# Patient Record
Sex: Female | Born: 1990 | Race: White | Hispanic: No | Smoking: Current every day smoker
Health system: Southern US, Community
[De-identification: ages and names within clinical notes are randomized; demographics above are authoritative.]

## PROBLEM LIST (undated history)

## (undated) DIAGNOSIS — K219 Gastro-esophageal reflux disease without esophagitis: Secondary | ICD-10-CM

## (undated) DIAGNOSIS — F32A Depression, unspecified: Secondary | ICD-10-CM

## (undated) DIAGNOSIS — Z720 Tobacco use: Secondary | ICD-10-CM

## (undated) DIAGNOSIS — I10 Essential (primary) hypertension: Secondary | ICD-10-CM

## (undated) DIAGNOSIS — Z973 Presence of spectacles and contact lenses: Secondary | ICD-10-CM

## (undated) DIAGNOSIS — F419 Anxiety disorder, unspecified: Secondary | ICD-10-CM

## (undated) DIAGNOSIS — E109 Type 1 diabetes mellitus without complications: Secondary | ICD-10-CM

## (undated) DIAGNOSIS — F329 Major depressive disorder, single episode, unspecified: Secondary | ICD-10-CM

## (undated) HISTORY — DX: Essential (primary) hypertension: I10

## (undated) HISTORY — PX: FOOT SURGERY: SHX648

## (undated) HISTORY — DX: Anxiety disorder, unspecified: F41.9

## (undated) HISTORY — DX: Presence of spectacles and contact lenses: Z97.3

## (undated) HISTORY — DX: Gastro-esophageal reflux disease without esophagitis: K21.9

## (undated) HISTORY — DX: Depression, unspecified: F32.A

## (undated) HISTORY — DX: Type 1 diabetes mellitus without complications: E10.9

---
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
## 2014-05-25 DIAGNOSIS — E1065 Type 1 diabetes mellitus with hyperglycemia: Secondary | ICD-10-CM | POA: Insufficient documentation

## 2014-05-25 DIAGNOSIS — E1042 Type 1 diabetes mellitus with diabetic polyneuropathy: Secondary | ICD-10-CM | POA: Insufficient documentation

## 2014-05-25 DIAGNOSIS — E1040 Type 1 diabetes mellitus with diabetic neuropathy, unspecified: Secondary | ICD-10-CM | POA: Insufficient documentation

## 2014-08-24 DIAGNOSIS — I10 Essential (primary) hypertension: Secondary | ICD-10-CM | POA: Insufficient documentation

## 2015-04-16 ENCOUNTER — Emergency Department (HOSPITAL_COMMUNITY): Payer: Self-pay

## 2015-04-16 ENCOUNTER — Inpatient Hospital Stay (HOSPITAL_COMMUNITY)
Admission: EM | Admit: 2015-04-16 | Discharge: 2015-04-20 | DRG: 638 | Disposition: A | Discharge status: Home / Self Care | Payer: Self-pay | Attending: Internal Medicine | Admitting: Internal Medicine

## 2015-04-16 ENCOUNTER — Encounter (HOSPITAL_COMMUNITY): Payer: Self-pay | Admitting: Emergency Medicine

## 2015-04-16 DIAGNOSIS — Z720 Tobacco use: Secondary | ICD-10-CM | POA: Diagnosis present

## 2015-04-16 DIAGNOSIS — E119 Type 2 diabetes mellitus without complications: Secondary | ICD-10-CM

## 2015-04-16 DIAGNOSIS — B999 Unspecified infectious disease: Secondary | ICD-10-CM

## 2015-04-16 DIAGNOSIS — E1140 Type 2 diabetes mellitus with diabetic neuropathy, unspecified: Secondary | ICD-10-CM | POA: Diagnosis present

## 2015-04-16 DIAGNOSIS — L089 Local infection of the skin and subcutaneous tissue, unspecified: Secondary | ICD-10-CM | POA: Diagnosis present

## 2015-04-16 DIAGNOSIS — E10319 Type 1 diabetes mellitus with unspecified diabetic retinopathy without macular edema: Secondary | ICD-10-CM | POA: Diagnosis present

## 2015-04-16 DIAGNOSIS — F17200 Nicotine dependence, unspecified, uncomplicated: Secondary | ICD-10-CM | POA: Diagnosis present

## 2015-04-16 DIAGNOSIS — X58XXXA Exposure to other specified factors, initial encounter: Secondary | ICD-10-CM | POA: Diagnosis present

## 2015-04-16 DIAGNOSIS — E1165 Type 2 diabetes mellitus with hyperglycemia: Secondary | ICD-10-CM | POA: Diagnosis present

## 2015-04-16 DIAGNOSIS — L02612 Cutaneous abscess of left foot: Secondary | ICD-10-CM | POA: Diagnosis present

## 2015-04-16 DIAGNOSIS — S92322A Displaced fracture of second metatarsal bone, left foot, initial encounter for closed fracture: Secondary | ICD-10-CM | POA: Diagnosis present

## 2015-04-16 DIAGNOSIS — Z79899 Other long term (current) drug therapy: Secondary | ICD-10-CM

## 2015-04-16 DIAGNOSIS — L97529 Non-pressure chronic ulcer of other part of left foot with unspecified severity: Secondary | ICD-10-CM | POA: Diagnosis present

## 2015-04-16 DIAGNOSIS — Z794 Long term (current) use of insulin: Secondary | ICD-10-CM

## 2015-04-16 DIAGNOSIS — L03032 Cellulitis of left toe: Secondary | ICD-10-CM | POA: Diagnosis present

## 2015-04-16 DIAGNOSIS — E11628 Type 2 diabetes mellitus with other skin complications: Principal | ICD-10-CM | POA: Diagnosis present

## 2015-04-16 HISTORY — DX: Tobacco use: Z72.0

## 2015-04-16 LAB — COMPREHENSIVE METABOLIC PANEL
ALT: 18 U/L (ref 14–54)
AST: 14 U/L — ABNORMAL LOW (ref 15–41)
Albumin: 3.8 g/dL (ref 3.5–5.0)
Alkaline Phosphatase: 94 U/L (ref 38–126)
Anion gap: 9 (ref 5–15)
BUN: 12 mg/dL (ref 6–20)
CO2: 20 mmol/L — ABNORMAL LOW (ref 22–32)
Calcium: 9.2 mg/dL (ref 8.9–10.3)
Chloride: 104 mmol/L (ref 101–111)
Creatinine, Ser: 0.64 mg/dL (ref 0.44–1.00)
GFR calc Af Amer: >60 mL/min (ref >60)
GFR calc non Af Amer: >60 mL/min (ref >60)
Glucose, Bld: 404 mg/dL — ABNORMAL HIGH (ref 65–99)
Potassium: 4.7 mmol/L (ref 3.5–5.1)
Sodium: 133 mmol/L — ABNORMAL LOW (ref 135–145)
Total Bilirubin: 0.8 mg/dL (ref 0.3–1.2)
Total Protein: 6.9 g/dL (ref 6.5–8.1)

## 2015-04-16 LAB — URINE MICROSCOPIC-ADD ON: Bacteria, UA: NONE SEEN

## 2015-04-16 LAB — GLUCOSE, CAPILLARY: Glucose-Capillary: 312 mg/dL — ABNORMAL HIGH (ref 65–99)

## 2015-04-16 LAB — CBC
HCT: 36.5 % (ref 36.0–46.0)
Hemoglobin: 11.8 g/dL — ABNORMAL LOW (ref 12.0–15.0)
MCH: 28.6 pg (ref 26.0–34.0)
MCHC: 32.3 g/dL (ref 30.0–36.0)
MCV: 88.6 fL (ref 78.0–100.0)
Platelets: 259 10*3/uL (ref 150–400)
RBC: 4.12 MIL/uL (ref 3.87–5.11)
RDW: 13.6 % (ref 11.5–15.5)
WBC: 9.2 10*3/uL (ref 4.0–10.5)

## 2015-04-16 LAB — URINALYSIS, ROUTINE W REFLEX MICROSCOPIC
Bilirubin Urine: NEGATIVE
Glucose, UA: >1000 mg/dL — AB
Hgb urine dipstick: NEGATIVE
Ketones, ur: >80 mg/dL — AB
Leukocytes, UA: NEGATIVE
Nitrite: NEGATIVE
Protein, ur: NEGATIVE mg/dL
Specific Gravity, Urine: 1.034 — ABNORMAL HIGH (ref 1.005–1.030)
pH: 5.5 (ref 5.0–8.0)

## 2015-04-16 LAB — PREALBUMIN: Prealbumin: 22.2 mg/dL (ref 18–38)

## 2015-04-16 LAB — LIPASE, BLOOD: Lipase: 18 U/L (ref 11–51)

## 2015-04-16 LAB — TYPE AND SCREEN
ABO/RH(D): O POS
Antibody Screen: NEGATIVE

## 2015-04-16 LAB — PROTIME-INR
INR: 1.11 (ref 0.00–1.49)
Prothrombin Time: 14.5 seconds (ref 11.6–15.2)

## 2015-04-16 LAB — APTT: aPTT: 31 seconds (ref 24–37)

## 2015-04-16 LAB — ABO/RH: ABO/RH(D): O POS

## 2015-04-16 LAB — C-REACTIVE PROTEIN: CRP: 1 mg/dL — ABNORMAL HIGH (ref <1.0)

## 2015-04-16 LAB — LACTIC ACID, PLASMA: Lactic Acid, Venous: 1 mmol/L (ref 0.5–2.0)

## 2015-04-16 LAB — SEDIMENTATION RATE: Sed Rate: 35 mm/hr — ABNORMAL HIGH (ref 0–22)

## 2015-04-16 MED ORDER — SODIUM CHLORIDE 0.9 % IV SOLN
INTRAVENOUS | Status: DC
  Administered 2015-04-16 – 2015-04-18 (×3): via INTRAVENOUS

## 2015-04-16 MED ORDER — VANCOMYCIN HCL 10 G IV SOLR
2000.0000 mg | Freq: Once | INTRAVENOUS | Status: AC
  Administered 2015-04-16: 2000 mg via INTRAVENOUS
  Filled 2015-04-16: qty 2000

## 2015-04-16 MED ORDER — INSULIN ASPART 100 UNIT/ML ~~LOC~~ SOLN
0.0000 [IU] | Freq: Three times a day (TID) | SUBCUTANEOUS | Status: DC
  Administered 2015-04-17: 5 [IU] via SUBCUTANEOUS
  Administered 2015-04-17: 1 [IU] via SUBCUTANEOUS
  Administered 2015-04-17: 3 [IU] via SUBCUTANEOUS
  Administered 2015-04-18 (×2): 1 [IU] via SUBCUTANEOUS
  Administered 2015-04-18: 5 [IU] via SUBCUTANEOUS
  Administered 2015-04-19 (×3): 2 [IU] via SUBCUTANEOUS
  Administered 2015-04-20: 3 [IU] via SUBCUTANEOUS
  Administered 2015-04-20: 7 [IU] via SUBCUTANEOUS

## 2015-04-16 MED ORDER — ACETAMINOPHEN 650 MG RE SUPP: 650.0000 mg | Freq: Four times a day (QID) | RECTAL | Status: DC | PRN

## 2015-04-16 MED ORDER — VANCOMYCIN HCL IN DEXTROSE 1-5 GM/200ML-% IV SOLN
1000.0000 mg | Freq: Three times a day (TID) | INTRAVENOUS | Status: DC
  Administered 2015-04-17 – 2015-04-20 (×10): 1000 mg via INTRAVENOUS
  Filled 2015-04-16 (×12): qty 200

## 2015-04-16 MED ORDER — MORPHINE SULFATE (PF) 2 MG/ML IV SOLN
2.0000 mg | INTRAVENOUS | Status: DC | PRN
  Administered 2015-04-17 – 2015-04-20 (×6): 2 mg via INTRAVENOUS
  Filled 2015-04-16 (×6): qty 1

## 2015-04-16 MED ORDER — SODIUM CHLORIDE 0.9 % IV BOLUS (SEPSIS)
2000.0000 mL | Freq: Once | INTRAVENOUS | Status: AC
  Administered 2015-04-16: 2000 mL via INTRAVENOUS

## 2015-04-16 MED ORDER — PIPERACILLIN-TAZOBACTAM 3.375 G IVPB
3.3750 g | Freq: Three times a day (TID) | INTRAVENOUS | Status: DC
  Administered 2015-04-17 – 2015-04-20 (×10): 3.375 g via INTRAVENOUS
  Filled 2015-04-16 (×12): qty 50

## 2015-04-16 MED ORDER — OXYCODONE-ACETAMINOPHEN 5-325 MG PO TABS
2.0000 | ORAL_TABLET | Freq: Four times a day (QID) | ORAL | Status: DC | PRN
  Administered 2015-04-16 – 2015-04-19 (×8): 2 via ORAL
  Filled 2015-04-16 (×8): qty 2

## 2015-04-16 MED ORDER — PIPERACILLIN-TAZOBACTAM 3.375 G IVPB 30 MIN
3.3750 g | Freq: Once | INTRAVENOUS | Status: AC
  Administered 2015-04-16: 3.375 g via INTRAVENOUS
  Filled 2015-04-16: qty 50

## 2015-04-16 MED ORDER — ACETAMINOPHEN 325 MG PO TABS: 650.0000 mg | ORAL_TABLET | Freq: Four times a day (QID) | ORAL | Status: DC | PRN

## 2015-04-16 MED ORDER — INSULIN DETEMIR 100 UNIT/ML ~~LOC~~ SOLN
40.0000 [IU] | Freq: Every day | SUBCUTANEOUS | Status: DC
  Administered 2015-04-16: 40 [IU] via SUBCUTANEOUS
  Filled 2015-04-16 (×2): qty 0.4

## 2015-04-16 MED ORDER — ONDANSETRON HCL 4 MG/2ML IJ SOLN: 4.0000 mg | Freq: Three times a day (TID) | INTRAMUSCULAR | Status: DC | PRN

## 2015-04-16 MED ORDER — NICOTINE 21 MG/24HR TD PT24
21.0000 mg | MEDICATED_PATCH | Freq: Once | TRANSDERMAL | Status: AC
  Administered 2015-04-16: 21 mg via TRANSDERMAL
  Filled 2015-04-16 (×2): qty 1

## 2015-04-16 NOTE — H&P (Addendum)
[facility] History and Physical  Alicen Donalson ZOX:[id number] DOB: Jul 08, 1990 DOA: 04/16/2015  Referring physician: ED physician PCP: No PCP Per Patient  Specialists:   Chief Complaint: Left foot pain  HPI: Molly Savage is a 25 y.o. female with PMH of tobacco abuse, diabetes mellitus, who presents with left foot pain.  Pt reports that 2 weeks ago she noticed the callus in left foot. It became painful and since then she has developed a blister, which has progressed to have formed a wound. She reports one month ago she saw a podiatrist who filed the callus but it has returned. She said that she was hospitalized for similar infection in 2015. Currently patient hassevere pain, but no fever, chills. She has chronic nausea due to possible diabetic gastroparesis, but no vomiting, abdominal pain or diarrhea. She reports taking numerous doses of Tylenol and ibuprofen to be able to work. No medical treatment for this new infection prior to arrival. Patient does not have chest pain, shortness breath, cough, unilateral weakness, symptoms of UTI.  In ED, patient was found to have WBC 9.2, temperature normal, pseudohyponatremia, creatinine 0.64, lipase normal, blood sugar 404. X-ray of left foot showed large appearing soft tissue wound lateral to the fifth MTP joint with a second wound versus soft tissue gas between the distal fourth and fifth metatarsals. Negative for plain film evidence of osteomyelitis; acute or subacute appearing fracture neck of the second metatarsal with slight lateral displacement. Patient is admitted to inpatient for further urination the treatment and observation. Orthopedic surgeon, Dr. Sharol Given was consulted, will see in the morning.   EKG: Not done in ED, will get one.   Where does patient live?   At home Can patient participate in ADLs?  Yes   Review of Systems:   General: no fevers, chills, no changes in body weight, has fatigue HEENT: no blurry vision, hearing  changes or sore throat Pulm: no dyspnea, coughing, wheezing CV: no chest pain, no palpitations Abd: no nausea, vomiting, abdominal pain, diarrhea, constipation GU: no dysuria, burning on urination, increased urinary frequency, hematuria  Ext: no leg edema. Has left foot lesion and pain Neuro: no unilateral weakness, numbness, or tingling, no vision change or hearing loss Skin: no rash. MSK: No muscle spasm, no deformity, no limitation of range of movement in spin Heme: No easy bruising.  Travel history: No recent long distant travel.  Allergy: No Known Allergies  Past Medical History  Diagnosis Date  . Diabetes mellitus without complication ([place])   . Tobacco abuse     History reviewed. No pertinent past surgical history.  Social History:  reports that she has been smoking.  She does not have any smokeless tobacco history on file. She reports that she drinks alcohol. She reports that she does not use illicit drugs.  Family History:  Family History  Problem Relation Age of Onset  . Hypertension Mother   . Bipolar disorder Sister      Prior to Admission medications   Medication Sig Start Date End Date Taking? Authorizing Provider  acetaminophen (TYLENOL) 500 MG tablet Take 1,000 mg by mouth every 6 (six) hours as needed for moderate pain.   Yes Historical Provider, MD  ibuprofen (ADVIL,MOTRIN) 200 MG tablet Take 400 mg by mouth every 6 (six) hours as needed.   Yes Historical Provider, MD  insulin aspart (NOVOLOG) 100 UNIT/ML injection Inject 10-30 Units into the skin 3 (three) times daily before meals. Takes 30 units when eating Takes 10 units when not  eating   Yes Historical Provider, MD  insulin detemir (LEVEMIR) 100 UNIT/ML injection Inject 60 Units into the skin at bedtime.   Yes Historical Provider, MD    Physical Exam: Filed Vitals:   04/16/15 1533  BP: 134/76  Pulse: 99  Temp: 98.4 F (36.9 C)  TempSrc: Oral  Resp: 18  Height: _0  (1.727 m)  Weight: 110.309 kg  (243 lb 3 oz)  SpO2: 100%   General: Not in acute distress HEENT:       Eyes: PERRL, EOMI, no scleral icterus.       ENT: No discharge from the ears and nose, no pharynx injection, no tonsillar enlargement.        Neck: No JVD, no bruit, no mass felt. Heme: No neck lymph node enlargement. Cardiac: S1/S2, RRR, No murmurs, No gallops or rubs. Pulm: No rales, wheezing, rhonchi or rubs. Abd: Soft, nondistended, nontender, no rebound pain, no organomegaly, BS present. Ext: No pitting leg edema bilaterally. 2+DP/PT pulse bilaterally. A lesion is noted to the left lateral aspect of the distal foot over the MTP of the fifth toe, the lesion is greater than 2 cm with surrounding erythema, it looks deep, with pus. The entire area is tender to palpation and warm  Musculoskeletal: No joint deformities, No joint redness or warmth, no limitation of ROM in spin. Skin: No rashes.  Neuro: Alert, oriented X3, cranial nerves II-XII grossly intact, moves all extremities normally.  Psych: Patient is not psychotic, no suicidal or hemocidal ideation.  Labs on Admission:  Basic Metabolic Panel:  Recent Labs Lab 04/16/15 1615  NA 133*  K 4.7  CL 104  CO2 20*  GLUCOSE 404*  BUN 12  CREATININE 0.64  CALCIUM 9.2   Liver Function Tests:  Recent Labs Lab 04/16/15 1615  AST 14*  ALT 18  ALKPHOS 94  BILITOT 0.8  PROT 6.9  ALBUMIN 3.8    Recent Labs Lab 04/16/15 1615  LIPASE 18   No results for input(s): AMMONIA in the last 168 hours. CBC:  Recent Labs Lab 04/16/15 1615  WBC 9.2  HGB 11.8*  HCT 36.5  MCV 88.6  PLT 259   Cardiac Enzymes: No results for input(s): CKTOTAL, CKMB, CKMBINDEX, TROPONINI in the last 168 hours.  BNP (last 3 results) No results for input(s): BNP in the last 8760 hours.  ProBNP (last 3 results) No results for input(s): PROBNP in the last 8760 hours.  CBG: No results for input(s): GLUCAP in the last 168 hours.  Radiological Exams on Admission: Ap /  Lateral X-ray Left Foot  04/16/2015  CLINICAL DATA:  Wound on the lateral aspect of the left foot in a diabetic patient. Chronicity unknown. Initial encounter. EXAM: LEFT FOOT - 2 VIEW COMPARISON:  None. FINDINGS: A large wound is seen along the fifth MTP joint and there appears to be a second wound or collection of soft tissue gas between the distal fourth and fifth metatarsals. No bony destructive change about the fifth MTP joint is identified. The patient has a fracture through the neck of the fifth metatarsal which shows minimal lateral displacement. Bones appear somewhat osteopenic. No other acute bony or joint abnormality is identified. IMPRESSION: Large appearing soft tissue wound lateral to the fifth MTP joint with a second wound versus soft tissue gas between the distal fourth and fifth metatarsals. Negative for plain film evidence of osteomyelitis. Acute or subacute appearing fracture neck of the second metatarsal with slight lateral displacement. Electronically Signed  By: Inge Rise M.D.   On: 04/16/2015 19:00    Assessment/Plan Principal Problem:   Diabetic infection of left foot (HCC) Active Problems:   Diabetes mellitus without complication (HCC)   Tobacco abuse   Diabetic infection of left foot ([place]): X-ray did not show osteomyelitis, but the lesion looks deep-->need to r/o osteo. Pt is not septic on admission. Hemodynamically stable. I spoke with ortho, Dr. Sharol Given who agreed to do MRI of her left foot, and he recommended to start IV Vanco and Zosyn. He will see patient in the morning.  - will admit to Med-surg bed for observation - Empiric antimicrobial treatment with vancomycin and Zosyn per pharmacy - PRN Zofran for nausea, morphine and Percocet for pain - Blood cultures x 2  - ESR and CRP - wound care consult - MRI-Left foot - will get lactic acid levels - IVF: 2.0L of NS bolus in ED, followed by 100cc/h - INR/PTT/type & screen - Follow-up ortho's recommendation -  f/u ABI which is ordered by EDP  Acute or subacute appearing fracture neck of the second metatarsal with slight lateral displacement: This was an incidental finding on x-ray. -Follow-up orthostatic recommendations -pain control as above  DM-II: Last A1c not on record. Patient is taking Lantus and NovoLog at home -will decrease Lantus dose from 60-40 units daily  -SSI -Check A1c  Tobacco abuse: -Did counseling about importance of quitting smoking -Nicotine patch  DVT ppx: SCD  Code Status: Full code Family Communication: None at bed side. Disposition Plan: Admit to inpatient   Date of Service 04/16/2015    Ivor Costa [facility] Pager [phone]  If 7PM-7AM, please contact night-coverage [email or web address] Password [email or web address] 04/16/2015, 8:30 PM

## 2015-04-16 NOTE — Progress Notes (Signed)
Pharmacy Antibiotic Note  Dorna BloomSusan Lubas is a 25 y.o. female admitted on 04/16/2015 with foot pain and noted with DM foot infection (no osteo on X-ray). Pharmacy has been consulted for vancomycin/zosyn dosing. -WBC= 9.2, afeb, SCr= 0.64, CrCl > 100  Plan: -Zosyn 3.375gm IV q8h -Vancomycin 2000mg  IV x1 followed by 1000mg  IV q8h (goal trough 10-15) -Will follow renal function, cultures and clinical progress   Height: 5\' 8"  (172.7 cm) Weight: 243 lb 3 oz (110.309 kg) IBW/kg (Calculated) : 63.9  Temp (24hrs), Avg:98.4 F (36.9 C), Min:98.4 F (36.9 C), Max:98.4 F (36.9 C)   Recent Labs Lab 04/16/15 1615  WBC 9.2  CREATININE 0.64    Estimated Creatinine Clearance: 138.8 mL/min (by C-G formula based on Cr of 0.64).    No Known Allergies  Antimicrobials this admission: Vanc 3/26>> Zosyn 3/26>>  Microbiology results: 3/26 blood x2  Thank you for allowing pharmacy to be a part of this patient's care.  Harland GermanAndrew Raheem Kolbe, Pharm D 04/16/2015 8:29 PM

## 2015-04-16 NOTE — ED Provider Notes (Signed)
CSN: [id number]     Arrival date & time 04/16/15  1503 History   First MD Initiated Contact with Patient 04/16/15 1650     Chief Complaint  Patient presents with  . Foot Pain     (Consider location/radiation/quality/duration/timing/severity/associated sxs/prior Treatment) The history is provided by the patient, medical records and a significant other. No language interpreter was used.     Molly BloomSusan Gambill is a 25 y.o. female  with a hx of IDDM presents to the Emergency Department complaining of gradual, persistent, progressively worsening wounds to the left foot onset 2 weeks ago. She reports she has had a callus, left foot for many years and was hospitalized for similar infection in 2015. She reports one month ago she saw a podiatrist who filed the callus but returned. She reports 2 weeks ago she noticed the callus became painful and since then she has developed a blister to the bottom of her foot. She reports taking numerous doses of Tylenol and ibuprofen to be able to work. No medical treatment for this new infection prior to arrival. She denies fever, chills, nausea, vomiting, numbness, tingling, weakness.    Past Medical History  Diagnosis Date  . Diabetes mellitus without complication (HCC)    History reviewed. No pertinent past surgical history. No family history on file. Social History  Substance Use Topics  . Smoking status: Current Every Day Smoker  . Smokeless tobacco: None  . Alcohol Use: Yes   OB History    No data available     Review of Systems  Constitutional: Negative for fever, diaphoresis, appetite change, fatigue and unexpected weight change.  HENT: Negative for mouth sores.   Eyes: Negative for visual disturbance.  Respiratory: Negative for cough, chest tightness, shortness of breath and wheezing.   Cardiovascular: Negative for chest pain.  Gastrointestinal: Negative for nausea, vomiting, abdominal pain, diarrhea and constipation.  Endocrine: Negative for  polydipsia, polyphagia and polyuria.  Genitourinary: Negative for dysuria, urgency, frequency and hematuria.  Musculoskeletal: Negative for back pain and neck stiffness.  Skin: Positive for color change and wound. Negative for rash.  Allergic/Immunologic: Negative for immunocompromised state.  Neurological: Negative for syncope, light-headedness and headaches.  Hematological: Does not bruise/bleed easily.  Psychiatric/Behavioral: Negative for sleep disturbance. The patient is not nervous/anxious.       Allergies  Review of patient's allergies indicates no known allergies.  Home Medications   Prior to Admission medications   Medication Sig Start Date End Date Taking? Authorizing Provider  acetaminophen (TYLENOL) 500 MG tablet Take 1,000 mg by mouth every 6 (six) hours as needed for moderate pain.   Yes Historical Provider, MD  ibuprofen (ADVIL,MOTRIN) 200 MG tablet Take 400 mg by mouth every 6 (six) hours as needed.   Yes Historical Provider, MD  insulin aspart (NOVOLOG) 100 UNIT/ML injection Inject 10-30 Units into the skin 3 (three) times daily before meals. Takes 30 units when eating Takes 10 units when not eating   Yes Historical Provider, MD  insulin detemir (LEVEMIR) 100 UNIT/ML injection Inject 60 Units into the skin at bedtime.   Yes Historical Provider, MD   BP 134/76 mmHg  Pulse 99  Temp(Src) 98.4 F (36.9 C) (Oral)  Resp 18  Ht 5\' 8"  (1.727 m)  Wt 110.309 kg  BMI 36.99 kg/m2  SpO2 100%  LMP 04/10/2015 Physical Exam  Constitutional: She appears well-developed and well-nourished. No distress.  Awake, alert, nontoxic appearance  HENT:  Head: Normocephalic and atraumatic.  Mouth/Throat: Oropharynx is clear and  moist. No oropharyngeal exudate.  Eyes: Conjunctivae are normal. No scleral icterus.  Neck: Normal range of motion. Neck supple.  Cardiovascular: Normal rate, regular rhythm and intact distal pulses.   Pulmonary/Chest: Effort normal and breath sounds normal. No  respiratory distress. She has no wheezes.  Equal chest expansion  Abdominal: Soft. Bowel sounds are normal. She exhibits no mass. There is no tenderness. There is no rebound and no guarding.  Musculoskeletal: Normal range of motion. She exhibits no edema.  Full range of motion of the left ankle and toes of the left foot  Neurological: She is alert.  Speech is clear and goal oriented Moves extremities without ataxia Sensation at baseline to the left foot  Skin: Skin is warm and dry. She is not diaphoretic.  Callus noted to the left lateral aspect of the distal foot over the MTP of the fifth toe.  Large blister filled with pus and discolored extends onto the sole of the foot with greater than 2 cm of surrounding erythema. The entire area is tender to palpation and warm  Psychiatric: She has a normal mood and affect.  Nursing note and vitals reviewed.   ED Course  Procedures (including critical care time) Labs Review Labs Reviewed  COMPREHENSIVE METABOLIC PANEL - Abnormal; Notable for the following:    Sodium 133 (*)    CO2 20 (*)    Glucose, Bld 404 (*)    AST 14 (*)    All other components within normal limits  CBC - Abnormal; Notable for the following:    Hemoglobin 11.8 (*)    All other components within normal limits  URINALYSIS, ROUTINE W REFLEX MICROSCOPIC (NOT AT [facility]) - Abnormal; Notable for the following:    Specific Gravity, Urine 1.034 (*)    Glucose, UA >1000 (*)    Ketones, ur >80 (*)    All other components within normal limits  URINE MICROSCOPIC-ADD ON - Abnormal; Notable for the following:    Squamous Epithelial / LPF 0-5 (*)    All other components within normal limits  CULTURE, BLOOD (ROUTINE X 2)  CULTURE, BLOOD (ROUTINE X 2)  LIPASE, BLOOD  HEMOGLOBIN A1C  HIV ANTIBODY (ROUTINE TESTING)  SEDIMENTATION RATE  C-REACTIVE PROTEIN  PREALBUMIN    Imaging Review Ap / Lateral X-ray Left Foot  04/16/2015  CLINICAL DATA:  Wound on the lateral aspect of the left  foot in a diabetic patient. Chronicity unknown. Initial encounter. EXAM: LEFT FOOT - 2 VIEW COMPARISON:  None. FINDINGS: A large wound is seen along the fifth MTP joint and there appears to be a second wound or collection of soft tissue gas between the distal fourth and fifth metatarsals. No bony destructive change about the fifth MTP joint is identified. The patient has a fracture through the neck of the fifth metatarsal which shows minimal lateral displacement. Bones appear somewhat osteopenic. No other acute bony or joint abnormality is identified. IMPRESSION: Large appearing soft tissue wound lateral to the fifth MTP joint with a second wound versus soft tissue gas between the distal fourth and fifth metatarsals. Negative for plain film evidence of osteomyelitis. Acute or subacute appearing fracture neck of the second metatarsal with slight lateral displacement. Electronically Signed   By: Drusilla Kanner M.D.   On: 04/16/2015 19:00   I have personally reviewed and evaluated these images and lab results as part of my medical decision-making.  MDM   Final diagnoses:  Infection  Diabetes mellitus without complication (HCC)   Molly Savage  presents with diabetic foot infection present for approximately 2 weeks. No evidence of SIRS or sepsis. Patient will need admission for further workup and treatment.   7:47 PM Initial labs reassuring. Discussed with Dr. Clyde Lundborg who will admit to medsurg.    Dahlia Client Elynore Dolinski, PA-C 04/16/15 1948  Rolland Porter, MD 04/22/15 1002

## 2015-04-16 NOTE — ED Notes (Addendum)
Pt c/o had a callous on left foot that has caused infection to her foot. Pt has been hospitalized 2 years ago for same foot issue. Pt is diabetic. Pt uses 4 extra strength tylenol 3 times a day and 4 ibuprofen 3 times a day for pain relief.

## 2015-04-17 ENCOUNTER — Observation Stay (HOSPITAL_BASED_OUTPATIENT_CLINIC_OR_DEPARTMENT_OTHER): Payer: MEDICAID

## 2015-04-17 ENCOUNTER — Observation Stay (HOSPITAL_COMMUNITY): Payer: Self-pay

## 2015-04-17 DIAGNOSIS — L089 Local infection of the skin and subcutaneous tissue, unspecified: Secondary | ICD-10-CM

## 2015-04-17 DIAGNOSIS — Z720 Tobacco use: Secondary | ICD-10-CM

## 2015-04-17 DIAGNOSIS — E119 Type 2 diabetes mellitus without complications: Secondary | ICD-10-CM

## 2015-04-17 DIAGNOSIS — E11628 Type 2 diabetes mellitus with other skin complications: Secondary | ICD-10-CM | POA: Diagnosis present

## 2015-04-17 DIAGNOSIS — M79672 Pain in left foot: Secondary | ICD-10-CM

## 2015-04-17 LAB — BASIC METABOLIC PANEL
Anion gap: 6 (ref 5–15)
BUN: 7 mg/dL (ref 6–20)
CO2: 21 mmol/L — ABNORMAL LOW (ref 22–32)
Calcium: 8.2 mg/dL — ABNORMAL LOW (ref 8.9–10.3)
Chloride: 110 mmol/L (ref 101–111)
Creatinine, Ser: 0.45 mg/dL (ref 0.44–1.00)
GFR calc Af Amer: >60 mL/min (ref >60)
GFR calc non Af Amer: >60 mL/min (ref >60)
Glucose, Bld: 260 mg/dL — ABNORMAL HIGH (ref 65–99)
Potassium: 4.3 mmol/L (ref 3.5–5.1)
Sodium: 137 mmol/L (ref 135–145)

## 2015-04-17 LAB — LACTIC ACID, PLASMA: Lactic Acid, Venous: 0.6 mmol/L (ref 0.5–2.0)

## 2015-04-17 LAB — GLUCOSE, CAPILLARY
Glucose-Capillary: 229 mg/dL — ABNORMAL HIGH (ref 65–99)
Glucose-Capillary: 270 mg/dL — ABNORMAL HIGH (ref 65–99)
Glucose-Capillary: 144 mg/dL — ABNORMAL HIGH (ref 65–99)
Glucose-Capillary: 196 mg/dL — ABNORMAL HIGH (ref 65–99)

## 2015-04-17 LAB — HIV ANTIBODY (ROUTINE TESTING W REFLEX): HIV Screen 4th Generation wRfx: NONREACTIVE

## 2015-04-17 LAB — HEMOGLOBIN A1C
Hgb A1c MFr Bld: 8.4 % — ABNORMAL HIGH (ref 4.8–5.6)
Mean Plasma Glucose: 194 mg/dL

## 2015-04-17 MED ORDER — NICOTINE 21 MG/24HR TD PT24
21.0000 mg | MEDICATED_PATCH | Freq: Every day | TRANSDERMAL | Status: DC
  Administered 2015-04-17 – 2015-04-20 (×4): 21 mg via TRANSDERMAL
  Filled 2015-04-17 (×3): qty 1

## 2015-04-17 MED ORDER — INSULIN DETEMIR 100 UNIT/ML ~~LOC~~ SOLN
60.0000 [IU] | Freq: Every day | SUBCUTANEOUS | Status: DC
  Administered 2015-04-17 – 2015-04-19 (×3): 60 [IU] via SUBCUTANEOUS
  Filled 2015-04-17 (×4): qty 0.6

## 2015-04-17 MED ORDER — GADOBENATE DIMEGLUMINE 529 MG/ML IV SOLN
20.0000 mL | Freq: Once | INTRAVENOUS | Status: AC | PRN
  Administered 2015-04-17: 20 mL via INTRAVENOUS

## 2015-04-17 MED ORDER — COLLAGENASE 250 UNIT/GM EX OINT
TOPICAL_OINTMENT | Freq: Every day | CUTANEOUS | Status: DC
  Administered 2015-04-20: 10:00:00 via TOPICAL
  Filled 2015-04-17: qty 30

## 2015-04-17 MED ORDER — INSULIN ASPART 100 UNIT/ML ~~LOC~~ SOLN
15.0000 [IU] | Freq: Three times a day (TID) | SUBCUTANEOUS | Status: DC
  Administered 2015-04-17 – 2015-04-20 (×10): 15 [IU] via SUBCUTANEOUS

## 2015-04-17 NOTE — Evaluation (Signed)
Physical Therapy Evaluation Patient Details Name: Molly Savage MRN: [id number] DOB: January 08, 1991 Today's Date: 04/17/2015   History of Present Illness  Pt admitted with left 5th metatarsal ulcer. PMHx: DM  Clinical Impression  Pt presents with decreased functional mobility due to pain and NWB in LLE. Pt would benefit from therapy to increase strength and endurance with ambulation with an assistive device, as well as to decrease apprehension with stairs. Pt was apprehensive to use walker with stairs, and would benefit from using crutches with stair training.    Follow Up Recommendations No PT follow up;Supervision for mobility/OOB    Equipment Recommendations  Rolling walker with 5" wheels    Recommendations for Other Services       Precautions / Restrictions Restrictions Weight Bearing Restrictions: Yes LLE Weight Bearing: Non weight bearing      Mobility  Bed Mobility Overal bed mobility: Modified Independent                Transfers Overall transfer level: Needs assistance   Transfers: Sit to/from Stand Sit to Stand: Supervision         General transfer comment: cues for hand placement and safety  Ambulation/Gait Ambulation/Gait assistance: Min guard Ambulation Distance (Feet): 75 Feet Assistive device: Rolling walker (2 wheeled) Gait Pattern/deviations: Step-to pattern   Gait velocity interpretation: Below normal speed for age/gender General Gait Details: cues for position in RW, safety and sequence. pt limited by fatigue  Stairs Stairs: Yes Stairs assistance: Min assist Stair Management: Backwards;With walker Number of Stairs: 1 General stair comments: pt able to perform 1 step backward well but once Rw uneven pt became nervous despite cues, education and reassurance  Wheelchair Mobility    Modified Rankin (Stroke Patients Only)       Balance Overall balance assessment: Modified Independent                                           Pertinent Vitals/Pain Pain Assessment: 0-10 Pain Score: 8  Pain Location: left foot Pain Descriptors / Indicators: Aching    Home Living Family/patient expects to be discharged to:: Private residence Living Arrangements: Spouse/significant other Available Help at Discharge: Friend(s);Available PRN/intermittently Type of Home: Apartment Home Access: Stairs to enter Entrance Stairs-Rails: None Entrance Stairs-Number of Steps: 3 Home Layout: One level Home Equipment: None      Prior Function Level of Independence: Independent               Hand Dominance        Extremity/Trunk Assessment   Upper Extremity Assessment: Overall WFL for tasks assessed           Lower Extremity Assessment: Overall WFL for tasks assessed      Cervical / Trunk Assessment: Normal  Communication   Communication: No difficulties  Cognition Arousal/Alertness: Awake/alert Behavior During Therapy: WFL for tasks assessed/performed Overall Cognitive Status: Within Functional Limits for tasks assessed                      General Comments General comments (skin integrity, edema, etc.): walker needed for balance due to NWB on LLE    Exercises        Assessment/Plan    PT Assessment Patient needs continued PT services  PT Diagnosis Acute pain;Difficulty walking   PT Problem List Decreased strength;Decreased activity tolerance;Pain;Decreased balance;Decreased mobility;Decreased knowledge of use of DME;Decreased skin  integrity  PT Treatment Interventions DME instruction;Gait training;Stair training;Patient/family education;Functional mobility training;Balance training   PT Goals (Current goals can be found in the Care Plan section) Acute Rehab PT Goals Patient Stated Goal: return to work PT Goal Formulation: With patient Time For Goal Achievement: 04/24/15 Potential to Achieve Goals: Good    Frequency Min 3X/week   Barriers to discharge Decreased caregiver  support      Co-evaluation               End of Session Equipment Utilized During Treatment: Gait belt Activity Tolerance: Patient tolerated treatment well Patient left: in chair;with call bell/phone within reach      Functional Assessment Tool Used: clinical judgement Functional Limitation: Mobility: Walking and moving around Mobility: Walking and Moving Around Current Status [phone]): At least 1 percent but less than 20 percent impaired, limited or restricted Mobility: Walking and Moving Around Goal Status [phone]): At least 1 percent but less than 20 percent impaired, limited or restricted    Time: 1005-1030 PT Time Calculation (min) (ACUTE ONLY): 25 min   Charges:   PT Evaluation $PT Eval Moderate Complexity: 1 Procedure PT Treatments $Gait Training: 8-22 mins   PT G Codes:   PT G-Codes **NOT FOR INPATIENT CLASS** Functional Assessment Tool Used: clinical judgement Functional Limitation: Mobility: Walking and moving around Mobility: Walking and Moving Around Current Status [phone]): At least 1 percent but less than 20 percent impaired, limited or restricted Mobility: Walking and Moving Around Goal Status [phone]([id number]): At least 1 percent but less than 20 percent impaired, limited or restricted   Cherene Julianaola Waylen Dep[place]olo, [place]PT [phone]  Cherene Julianaola Sesar [place] 04/17/2015, 11:20 AM

## 2015-04-17 NOTE — Progress Notes (Signed)
Physical Therapy Wound Evaluation/Treatment Patient Details  Name: Molly Savage MRN: [id number] Date of Birth: [birth year]  Today's Date: 04/17/2015 Time:  -     Subjective  Subjective: Pt agreeable to hydrotherapy Patient and Family Stated Goals: Heal wound Date of Onset:  (Pt reports 3 months)  Pain Score: Pt reports increased pain during session and RN was notified. RN provided pain medicine during session.   Wound Assessment  Wound / Incision (Open or Dehisced) 04/17/15 Diabetic ulcer Foot Left;Other (Comment) Plantar surface (Active)  Dressing Type Compression wrap;Foam;Moist to dry 04/17/2015  2:12 PM  Dressing Changed Changed 04/17/2015  2:12 PM  Dressing Status Clean;Dry;Intact 04/17/2015  2:12 PM  Dressing Change Frequency Daily 04/17/2015  2:12 PM  Site / Wound Assessment Red;Yellow;Brown 04/17/2015  2:12 PM  % Wound base Red or Granulating 60% 04/17/2015  2:12 PM  % Wound base Yellow 35% 04/17/2015  2:12 PM  % Wound base Black 5% 04/17/2015  2:12 PM  Peri-wound Assessment Intact;Maceration 04/17/2015  2:12 PM  Wound Length (cm) 2.5 cm 04/17/2015  2:12 PM  Wound Width (cm) 5.3 cm 04/17/2015  2:12 PM  Wound Depth (cm) 0.2 cm 04/17/2015  2:12 PM  Margins Unattached edges (unapproximated) 04/17/2015  2:12 PM  Closure None 04/17/2015  2:12 PM  Drainage Amount Minimal 04/17/2015  2:12 PM  Drainage Description Serosanguineous 04/17/2015  2:12 PM  Treatment Hydrotherapy (Pulse lavage);Packing (Saline gauze) 04/17/2015  2:12 PM  Santyl applied to wound bed prior to applying dressing.   Hydrotherapy Pulsed lavage therapy - wound location: L foot plantar surface Pulsed Lavage with Suction (psi): 8 psi (4 at times due to pain) Pulsed Lavage with Suction - Normal Saline Used: 1000 mL Pulsed Lavage Tip: Tip with splash shield   Wound Assessment and Plan  Wound Therapy - Assess/Plan/Recommendations Wound Therapy - Clinical Statement: Pt presents to hydrotherapy with a progressive diabetic  foot ulcer. Pt will benefit from continued hydrotherapy to promote wound bed healing and decrease bioburden.  Wound Therapy - Functional Problem List: Decreased tolerance or OOB, limited weight bearing through L foot.  Factors Delaying/Impairing Wound Healing: Diabetes Mellitus Hydrotherapy Plan: Dressing change;Debridement;Patient/family education;Pulsatile lavage with suction Wound Therapy - Frequency: 6X / week Wound Therapy - Follow Up Recommendations: Home health RN Wound Plan: See above  Wound Therapy Goals- Improve the function of patient's integumentary system by progressing the wound(s) through the phases of wound healing (inflammation - proliferation - remodeling) by: Decrease Necrotic Tissue to: 0% Decrease Necrotic Tissue - Progress: Goal set today Increase Granulation Tissue to: 100% Increase Granulation Tissue - Progress: Goal set today Goals/treatment plan/discharge plan were made with and agreed upon by patient/family: Yes Time For Goal Achievement: 7 days Wound Therapy - Potential for Goals: Good  Goals will be updated until maximal potential achieved or discharge criteria met.  Discharge criteria: when goals achieved, discharge from hospital, MD decision/surgical intervention, no progress towards goals, refusal/missing three consecutive treatments without notification or medical reason.  GP PT G-Codes **NOT FOR INPATIENT CLASS** Functional Assessment Tool Used: clinical judgement Functional Limitation: Mobility: Walking and moving around Mobility: Walking and Moving Around Current Status (H9622): At least 1 percent but less than 20 percent impaired, limited or restricted Mobility: Walking and Moving Around Goal Status [id number]): At least 1 percent but less than 20 percent impaired, limited or restricted   Rolinda Roan 04/17/2015, 2:23 PM   Rolinda Roan, PT, DPT Acute Rehabilitation Services Pager: [phone]

## 2015-04-17 NOTE — Progress Notes (Signed)
VASCULAR LAB PRELIMINARY  ARTERIAL  ABI completed: Study was technically difficult due to poor patient cooperation and movement.   RIGHT    LEFT    PRESSURE WAVEFORM  PRESSURE WAVEFORM  BRACHIAL 122 Triphasic BRACHIAL 128 Triphasic  DP 158 Triphasic DP 156 Triphasic  AT   AT    PT 165 Triphasic PT 159 Triphasic  PER   PER    GREAT TOE  NA GREAT TOE  NA    RIGHT LEFT  ABI 1.29 1.24   Bilateral ABIs are within normal limits.  04/17/2015 10:03 AM Gertie FeyMichelle Nyzier Boivin, RVT, RDCS, RDMS

## 2015-04-17 NOTE — Progress Notes (Signed)
[facility] PROGRESS NOTE  Molly Savage ZOX:[id number] DOB: 29-Oct-1990 DOA: 04/16/2015 PCP: No PCP Per Patient  Assessment/Plan: 25 y/o female with PMH Chronic Tobacco use, IDDM presented with left foot pain. Admitted with DM foot ulcer   DM foot ulcer, cellulitis abscess. S/p bedside I&D. MRI foot: Soft tissue ulcerations at the lateral and plantar aspects of the foot without underlying osteomyelitis or abscess. CRP-1. No leukocytosis  -we will cont IV atx (vanc/zosyn 3/26<<). Pend cultures. Deescalate atx in 24-458 hrs. appreciate ortho input   Subacute fracture of the distal second metatarsal. Obtain PT/OT.  nonweightbearing on the left per ortho   IDDM. Uncontrolled. ha1c-8.4. Patient reports taking up to 30U novolog TID+levemir 60U QHS. She is requesting to resume home regimen  -we will resume 15U mealtime insulin+ lantus 60U QHS. Titrate as needed.   Tobacco use. Nicotine patch. Stop smoking     Code Status: full Family Communication: d/w patient, RN (indicate person spoken with, relationship, and if by phone, the number) Disposition Plan: home soon    Consultants:  Ortho   Procedures:  Bedside I&D  Antibiotics:  Vanc/zosyn 3/26>>. (indicate start date, and stop date if known)  HPI/Subjective: Alert, no distress   Objective: Filed Vitals:   04/16/15 2156 04/17/15 0634  BP: 138/78 120/73  Pulse: 95 74  Temp: 97.7 F (36.5 C) 98.1 F (36.7 C)  Resp: 18 18    Intake/Output Summary (Last 24 hours) at 04/17/15 1116 Last data filed at 04/17/15 1021  Gross per 24 hour  Intake   2470 ml  Output    300 ml  Net   2170 ml   Filed Weights   04/16/15 1533  Weight: 110.309 kg (243 lb 3 oz)    Exam:   General:  Comfortable   Cardiovascular: s1,s2 rrr  Respiratory: CTA BL   Abdomen: soft, nt obese   Musculoskeletal: left foot ulcer    Data Reviewed: Basic Metabolic Panel:  Recent Labs Lab 04/16/15 1615 04/17/15 0631  NA 133* 137  K  4.7 4.3  CL 104 110  CO2 20* 21*  GLUCOSE 404* 260*  BUN 12 7  CREATININE 0.64 0.45  CALCIUM 9.2 8.2*   Liver Function Tests:  Recent Labs Lab 04/16/15 1615  AST 14*  ALT 18  ALKPHOS 94  BILITOT 0.8  PROT 6.9  ALBUMIN 3.8    Recent Labs Lab 04/16/15 1615  LIPASE 18   No results for input(s): AMMONIA in the last 168 hours. CBC:  Recent Labs Lab 04/16/15 1615  WBC 9.2  HGB 11.8*  HCT 36.5  MCV 88.6  PLT 259   Cardiac Enzymes: No results for input(s): CKTOTAL, CKMB, CKMBINDEX, TROPONINI in the last 168 hours. BNP (last 3 results) No results for input(s): BNP in the last 8760 hours.  ProBNP (last 3 results) No results for input(s): PROBNP in the last 8760 hours.  CBG:  Recent Labs Lab 04/16/15 2212 04/17/15 0721  GLUCAP 312* 229*    Recent Results (from the past 240 hour(s))  Blood Cultures x 2 sites     Status: None (Preliminary result)   Collection Time: 04/16/15  6:36 PM  Result Value Ref Range Status   Specimen Description BLOOD RIGHT ARM  Final   Special Requests BOTTLES DRAWN AEROBIC AND ANAEROBIC 10CC  Final   Culture NO GROWTH < 24 HOURS  Final   Report Status PENDING  Incomplete  Blood Cultures x 2 sites     Status: None (Preliminary result)  Collection Time: 04/16/15  8:35 PM  Result Value Ref Range Status   Specimen Description BLOOD RIGHT ANTECUBITAL  Final   Special Requests BOTTLES DRAWN AEROBIC AND ANAEROBIC 5CC EA  Final   Culture NO GROWTH < 12 HOURS  Final   Report Status PENDING  Incomplete     Studies: Mr Foot Left W Wo Contrast  04/17/2015  CLINICAL DATA:  Soft tissue ulceration of the left foot with soft tissue swelling. EXAM: MRI OF THE LEFT FOREFOOT WITHOUT AND WITH CONTRAST TECHNIQUE: Multiplanar, multisequence MR imaging was performed both before and after administration of intravenous contrast. CONTRAST:  20mL MULTIHANCE GADOBENATE DIMEGLUMINE 529 MG/ML IV SOLN COMPARISON:  Radiographs dated 04/16/2015 FINDINGS: There  is a comminuted slightly displaced fracture of the distal shaft of second metatarsal inflammation in adjacent soft tissues a small amount of fluid adjacent to the fracture. The bones of the forefoot  are otherwise normal. There is 24 mm diameter soft tissue ulceration at the plantar aspect of flipped superficial to the distal fifth metatarsal. No deep extension. There is also a focal 11 mm soft tissue ulceration lateral to the fifth metatarsal phalangeal joint. There is edema and enhancement around the joint there is no osteomyelitis or abscess evidence suggestive of a septic joint. Minimal soft tissue edema of the dorsum of the distal forefoot. IMPRESSION: 1. Soft tissue ulcerations at the lateral and plantar aspects of the foot without underlying osteomyelitis or abscess. 2. Subacute fracture of the distal second metatarsal with secondary inflammatory changes in the adjacent soft tissues. I suspect that the inflammatory changes are secondary to the fracture rather than infection. No discrete abscess. Electronically Signed   By: Francene Boyers M.D.   On: 04/17/2015 08:16   Ap / Lateral X-ray Left Foot  04/16/2015  CLINICAL DATA:  Wound on the lateral aspect of the left foot in a diabetic patient. Chronicity unknown. Initial encounter. EXAM: LEFT FOOT - 2 VIEW COMPARISON:  None. FINDINGS: A large wound is seen along the fifth MTP joint and there appears to be a second wound or collection of soft tissue gas between the distal fourth and fifth metatarsals. No bony destructive change about the fifth MTP joint is identified. The patient has a fracture through the neck of the fifth metatarsal which shows minimal lateral displacement. Bones appear somewhat osteopenic. No other acute bony or joint abnormality is identified. IMPRESSION: Large appearing soft tissue wound lateral to the fifth MTP joint with a second wound versus soft tissue gas between the distal fourth and fifth metatarsals. Negative for plain film  evidence of osteomyelitis. Acute or subacute appearing fracture neck of the second metatarsal with slight lateral displacement. Electronically Signed   By: Drusilla Kanner M.D.   On: 04/16/2015 19:00    Scheduled Meds: . collagenase   Topical Daily  . insulin aspart  0-9 Units Subcutaneous TID WC  . insulin detemir  40 Units Subcutaneous QHS  . nicotine  21 mg Transdermal Once  . nicotine  21 mg Transdermal Daily  . piperacillin-tazobactam (ZOSYN)  IV  3.375 g Intravenous Q8H  . vancomycin  1,000 mg Intravenous Q8H   Continuous Infusions: . sodium chloride 100 mL/hr at 04/16/15 2227    Principal Problem:   Diabetic infection of left foot (HCC) Active Problems:   Diabetes mellitus without complication (HCC)   Tobacco abuse    Time spent: >35 minutes     Esperanza Sheets  [facility] Pager [phone]. If 7PM-7AM, please contact night-coverage at  [email or web address], password [email or web address] 04/17/2015, 11:16 AM

## 2015-04-17 NOTE — Consult Note (Signed)
ORTHOPAEDIC CONSULTATION  REQUESTING PHYSICIAN: Lorretta Harp, MD  Chief Complaint: Ulcerative abscess left foot fifth metatarsal head plantar aspect  HPI: Molly Savage is a 25 y.o. female who presents with a ulcer with abscess beneath the fifth metatarsal head. Patient has uncontrolled type 2 diabetes and has been having increasing pain in her left forefoot.  Past Medical History  Diagnosis Date  . Diabetes mellitus without complication (HCC)   . Tobacco abuse    History reviewed. No pertinent past surgical history. Social History   Social History  . Marital Status: Significant Other    Spouse Name: N/A  . Number of Children: N/A  . Years of Education: N/A   Social History Main Topics  . Smoking status: Current Every Day Smoker  . Smokeless tobacco: None  . Alcohol Use: Yes  . Drug Use: No  . Sexual Activity: Not Asked   Other Topics Concern  . None   Social History Narrative  . None   Family History  Problem Relation Age of Onset  . Hypertension Mother   . Bipolar disorder Sister    - negative except otherwise stated in the family history section No Known Allergies Prior to Admission medications   Medication Sig Start Date End Date Taking? Authorizing Provider  acetaminophen (TYLENOL) 500 MG tablet Take 1,000 mg by mouth every 6 (six) hours as needed for moderate pain.   Yes Historical Provider, MD  ibuprofen (ADVIL,MOTRIN) 200 MG tablet Take 400 mg by mouth every 6 (six) hours as needed.   Yes Historical Provider, MD  insulin aspart (NOVOLOG) 100 UNIT/ML injection Inject 10-30 Units into the skin 3 (three) times daily before meals. Takes 30 units when eating Takes 10 units when not eating   Yes Historical Provider, MD  insulin detemir (LEVEMIR) 100 UNIT/ML injection Inject 60 Units into the skin at bedtime.   Yes Historical Provider, MD   Ap / Lateral X-ray Left Foot  04/16/2015  CLINICAL DATA:  Wound on the lateral aspect of the left foot in a diabetic  patient. Chronicity unknown. Initial encounter. EXAM: LEFT FOOT - 2 VIEW COMPARISON:  None. FINDINGS: A large wound is seen along the fifth MTP joint and there appears to be a second wound or collection of soft tissue gas between the distal fourth and fifth metatarsals. No bony destructive change about the fifth MTP joint is identified. The patient has a fracture through the neck of the fifth metatarsal which shows minimal lateral displacement. Bones appear somewhat osteopenic. No other acute bony or joint abnormality is identified. IMPRESSION: Large appearing soft tissue wound lateral to the fifth MTP joint with a second wound versus soft tissue gas between the distal fourth and fifth metatarsals. Negative for plain film evidence of osteomyelitis. Acute or subacute appearing fracture neck of the second metatarsal with slight lateral displacement. Electronically Signed   By: Drusilla Kanner M.D.   On: 04/16/2015 19:00   - pertinent xrays, CT, MRI studies were reviewed and independently interpreted  Positive ROS: All other systems have been reviewed and were otherwise negative with the exception of those mentioned in the HPI and as above.  Physical Exam: General: Alert, no acute distress Cardiovascular: No pedal edema Respiratory: No cyanosis, no use of accessory musculature GI: No organomegaly, abdomen is soft and non-tender Skin: Ulcer with abscess beneath the fifth metatarsal head left foot Neurologic: Patient does not have protective sensation. Psychiatric: Patient is competent for consent with normal mood and affect Lymphatic: No axillary  or cervical lymphadenopathy  MUSCULOSKELETAL:  On examination patient is alert and oriented. She has a good dorsalis pedis pulse there is no cellulitis in her foot. She has a foul-smelling abscess beneath the fifth metatarsal head. After informed consent scissors and pickups were used to debride the skin and soft tissue back to a granulation tissue. This ulcer  does not probe to bone. The ulcer is 15 x 40 mm and 3 mm deep. Review of the MRI scan does not show any evidence of osteomyelitis does not show a deep abscess. Patient does have a stress fracture through her second metatarsal.  Assessment: Assessment: Diabetic insensate neuropathy with good pulses with abscess and ulceration beneath the fifth metatarsal head.  Plan: Plan: Ulcer debrided of skin and soft tissue and abscess decompressed, plan for IV antibiotics and wound care. Do not feel that surgical intervention is necessary at this time. We will write orders for Santyl dressing changes with Dial soap cleansing daily. Physical therapy nonweightbearing on the left. I will reevaluate tomorrow.  Thank you for the consult and the opportunity to see Molly Savage  Kani Jobson, MD [facility] [phone] 7:10 AM

## 2015-04-17 NOTE — Consult Note (Addendum)
WOC consult requested for foot wound prior to ortho service involvement.  They are now following for assessment and plan of care.  Please refer to Dr Lajoyce Cornersuda for further questions. Please re-consult if further assistance is needed.  Thank-you,  Cammie Mcgeeawn Shiann Kam MSN, RN, CWOCN, [place]CN-AP, CNS [phone]

## 2015-04-17 NOTE — Progress Notes (Signed)
Inpatient Diabetes Program Recommendations  AACE/ADA: New Consensus Statement on Inpatient Glycemic Control (2015)  Target Ranges:  Prepandial:   less than 140 mg/dL      Peak postprandial:   less than 180 mg/dL (1-2 hours)      Critically ill patients:  140 - 180 mg/dL   Review of Glycemic Control Results for Molly BloomBARTHOLOMAY, Nikkol (MRN [id number]) as of 04/17/2015 08:14  Ref. Range 04/17/2015 07:21  Glucose-Capillary Latest Ref Range: 65-99 mg/dL 829229 (H)   Diabetes history: DM Type 1 Outpatient Diabetes medications: Levemir 60 units @ hs + Novolog meal coverage tid of 30 units when eating and 10 units if she does not eat Current orders for Inpatient glycemic control:  Levemir 40 units @ hs   Inpatient Diabetes Program Recommendations:  Noted CBGs. Please consider increase of Levemir 60 units @ hs + addition of meal coverage (when eats > 50 %) Novolog  10 units tid.   Thank you, Billy FischerJudy E. Quynn Vilchis, RN, MSN, CDE Inpatient Glycemic Control Team Team Pager [phone] (8am-5pm) 04/17/2015 8:17 AM

## 2015-04-17 NOTE — Progress Notes (Signed)
RN noted patient is lacking some knowledge and understanding of Diabetes.  Patient seems to be aware of why Carbs/Sugar shouldn't be eaten and what Insulin is for.  However, patient insists on multiple snacks during the day which have been chips and crackers. Patient also has a large smoking habit she states is not willing to give up yet, the 21mg  Patch is not sufficient for her for 24 hours.  Due to her young age, already with a foot ulcer, stated she takes 30 units of Novolog at home with meals, RN believes patient is in need of some hard-core Diabetes education.  Patient needs to understand what she is putting herself at risk for down the road.

## 2015-04-17 NOTE — Plan of Care (Signed)
Problem: Food- and Nutrition-Related Knowledge Deficit (NB-1.1) Goal: Nutrition education Formal process to instruct or train a patient/client in a skill or to impart knowledge to help patients/clients voluntarily manage or modify food choices and eating behavior to maintain or improve health. Outcome: Completed/Met Date Met:  04/17/15  RD consulted for nutrition education regarding diabetes.     Lab Results  Component Value Date    HGBA1C 8.4* 04/16/2015    RD provided "Carbohydrate Counting for People with Diabetes" handout from the Academy of Nutrition and Dietetics. Discussed different food groups and their effects on blood sugar, emphasizing carbohydrate-containing foods. Provided list of carbohydrates and recommended serving sizes of common foods.  Discussed importance of controlled and consistent carbohydrate intake throughout the day. Pt reports she usually consumes 45-60 grams of carbohydrates at meals and 15 grams at snack. Provided examples of ways to balance meals/snacks and encouraged intake of high-fiber, whole grain complex carbohydrates. Discussed diabetic friendly drink options. Teach back method used.  Expect good compliance.  Body mass index is 36.99 kg/(m^2). Pt meets criteria for class II obesity based on current BMI.  Current diet order is carbohydrate modified, patient is consuming approximately 100% of meals at this time. Labs and medications reviewed. No further nutrition interventions warranted at this time. RD contact information provided. If additional nutrition issues arise, please re-consult RD.  Corrin Parker, MS, RD, LDN Pager # [phone] After hours/ weekend pager # [phone]

## 2015-04-18 LAB — GLUCOSE, CAPILLARY
Glucose-Capillary: 269 mg/dL — ABNORMAL HIGH (ref 65–99)
Glucose-Capillary: 145 mg/dL — ABNORMAL HIGH (ref 65–99)
Glucose-Capillary: 143 mg/dL — ABNORMAL HIGH (ref 65–99)

## 2015-04-18 MED ORDER — COLLAGENASE 250 UNIT/GM EX OINT: 1.0000 "application " | TOPICAL_OINTMENT | Freq: Every day | CUTANEOUS | Status: DC

## 2015-04-18 NOTE — Progress Notes (Signed)
Patient ID: [name], female   DOB: [birth year], 25 y.o.   MRN: [id number] Patient is seen in follow-up for the abscess ulceration left foot with diabetic insensate neuropathy. Evaluation after 1 treatment of wound care and hydrotherapy the wound bed has good granulation tissue approximately 75% granulation tissue the wound is 10 x 20 mm and 1 mm deep. I feel that continued wound care and IV antibiotics for several days in the hospital with discharge with continued wound care and oral antibiotics would be appropriate. I will follow-up in the office in 1 week.

## 2015-04-18 NOTE — Progress Notes (Signed)
Physical Therapy Treatment Patient Details Name: Molly Savage MRN: [id number] DOB: 12/02/1990 Today's Date: 04/18/2015    History of Present Illness Pt admitted with left 5th metatarsal ulcer. PMHx: DM    PT Comments    Pt pleasant and willing to work with therapy. Pt feels better about using crutches compared to a walker, but tends to ambulate quickly and is unstable with crutches, needing guarding for safety. Pt still apprehensive about stairs due to NWB on LLE. Will continue to work on stairs with pt to increase confidence and ensure proper stair mobility with use of DME.  Follow Up Recommendations  No PT follow up;Supervision for mobility/OOB     Equipment Recommendations  Crutches    Recommendations for Other Services       Precautions / Restrictions Precautions Precautions: Fall Restrictions Weight Bearing Restrictions: Yes LLE Weight Bearing: Non weight bearing    Mobility  Bed Mobility               General bed mobility comments: in chair on arrival  Transfers Overall transfer level: Needs assistance Equipment used: Crutches   Sit to Stand: Supervision         General transfer comment: cues for safety and NWB status for transition to toilet  Ambulation/Gait Ambulation/Gait assistance: Min guard Ambulation Distance (Feet): 80 Feet Assistive device: Crutches Gait Pattern/deviations: Step-to pattern   Gait velocity interpretation: Below normal speed for age/gender General Gait Details: cues to decrease gait speed for safety   Stairs   Stairs assistance: Min assist Stair Management: No rails;With crutches Number of Stairs: 3 General stair comments: pt able to perform steps with increased time, pt still nervous despite cues and reassurance, assist for balance. cues to maintain NWB status  Wheelchair Mobility    Modified Rankin (Stroke Patients Only)       Balance Overall balance assessment: Modified Independent                                  Cognition Arousal/Alertness: Awake/alert Behavior During Therapy: WFL for tasks assessed/performed Overall Cognitive Status: Within Functional Limits for tasks assessed                      Exercises      General Comments General comments (skin integrity, edema, etc.): crutches needed for balance due to NWB on LLE      Pertinent Vitals/Pain Pain Assessment: 0-10 Pain Score: 8  Pain Location: Left foot Pain Descriptors / Indicators: Aching Pain Intervention(s): Limited activity within patient's tolerance;Monitored during session;Patient requesting pain meds-RN notified    Home Living                      Prior Function            PT Goals (current goals can now be found in the care plan section) Acute Rehab PT Goals Potential to Achieve Goals: Good Progress towards PT goals: Progressing toward goals    Frequency       PT Plan Current plan remains appropriate;Equipment recommendations need to be updated    Co-evaluation             End of Session Equipment Utilized During Treatment: Gait belt Activity Tolerance: Patient tolerated treatment well Patient left: in chair;with call bell/phone within reach     Time: 1043-1107 PT Time Calculation (min) (ACUTE ONLY): 24 min  Charges:  $Gait Training:  23-37 mins                    G CodesCherene Julian:     Cydni Reddoch, MarylandPT [phone]  Cherene Julianaola Kaniyah Lisby 04/18/2015, 12:14 PM

## 2015-04-18 NOTE — Progress Notes (Signed)
Physical Therapy Wound Treatment Patient Details  Name: Molly Savage MRN: [id number] Date of Birth: May 07, 1990  Today's Date: 04/18/2015 Time: 8264-1583 Time Calculation (min): 21 min  Subjective  Subjective: Pt agreeable to hydrotherapy Patient and Family Stated Goals: Heal wound Date of Onset:  (Pt reports 3 months)  Pain Score: Premedicated prior to session.   Wound Assessment  Wound / Incision (Open or Dehisced) 04/17/15 Diabetic ulcer Foot Left;Other (Comment) Plantar surface (Active)  Dressing Type Compression wrap;Foam;Moist to dry 04/18/2015  1:09 PM  Dressing Changed Changed 04/18/2015  1:09 PM  Dressing Status Clean;Dry;Intact 04/18/2015  1:09 PM  Dressing Change Frequency Daily 04/18/2015  1:09 PM  Site / Wound Assessment Red;Yellow;Brown 04/18/2015  1:09 PM  % Wound base Red or Granulating 60% 04/18/2015  1:09 PM  % Wound base Yellow 40% 04/18/2015  1:09 PM  % Wound base Black 0% 04/18/2015  1:09 PM  Peri-wound Assessment Intact;Maceration 04/18/2015  1:09 PM  Wound Length (cm) 2.5 cm 04/17/2015  2:12 PM  Wound Width (cm) 5.3 cm 04/17/2015  2:12 PM  Wound Depth (cm) 0.2 cm 04/17/2015  2:12 PM  Margins Unattached edges (unapproximated) 04/18/2015  1:09 PM  Closure None 04/18/2015  1:09 PM  Drainage Amount Minimal 04/18/2015  1:09 PM  Drainage Description Serosanguineous 04/18/2015  1:09 PM  Treatment Hydrotherapy (Pulse lavage);Packing (Saline gauze) 04/17/2015  2:12 PM  Santyl applied to wound bed prior to applying dressing.   Hydrotherapy Pulsed lavage therapy - wound location: L foot plantar surface Pulsed Lavage with Suction (psi): 8 psi (4 at times due to pain) Pulsed Lavage with Suction - Normal Saline Used: 1000 mL Pulsed Lavage Tip: Tip with splash shield   Wound Assessment and Plan  Wound Therapy - Assess/Plan/Recommendations Wound Therapy - Clinical Statement: Progressing with wound bed healing. Wound Therapy - Functional Problem List: Decreased tolerance or OOB,  limited weight bearing through L foot.  Factors Delaying/Impairing Wound Healing: Diabetes Mellitus Hydrotherapy Plan: Dressing change;Debridement;Patient/family education;Pulsatile lavage with suction Wound Therapy - Frequency: 6X / week Wound Therapy - Follow Up Recommendations: Home health RN Wound Plan: See above  Wound Therapy Goals- Improve the function of patient's integumentary system by progressing the wound(s) through the phases of wound healing (inflammation - proliferation - remodeling) by: Decrease Necrotic Tissue to: 0% Decrease Necrotic Tissue - Progress: Progressing toward goal Increase Granulation Tissue to: 100% Increase Granulation Tissue - Progress: Progressing toward goal Goals/treatment plan/discharge plan were made with and agreed upon by patient/family: Yes Time For Goal Achievement: 7 days Wound Therapy - Potential for Goals: Good  Goals will be updated until maximal potential achieved or discharge criteria met.  Discharge criteria: when goals achieved, discharge from hospital, MD decision/surgical intervention, no progress towards goals, refusal/missing three consecutive treatments without notification or medical reason.  GP     Rolinda Roan 04/18/2015, 1:14 PM   Rolinda Roan, PT, DPT Acute Rehabilitation Services Pager: [phone]

## 2015-04-18 NOTE — Progress Notes (Signed)
[facility] PROGRESS NOTE  Molly BloomSusan Savage [id number] DOB: February 24, 1990 DOA: 04/16/2015 PCP: No PCP Per Patient  Assessment/Plan: 25 y/o female with PMH Chronic Tobacco use, IDDM presented with left foot pain. Admitted with DM foot ulcer   DM foot ulcer, cellulitis abscess. S/p bedside I&D. MRI foot: Soft tissue ulcerations at the lateral and plantar aspects of the foot without underlying osteomyelitis or abscess. CRP-1. No leukocytosis  -Pt is improving on IV atx (vanc/zosyn 3/26<<). Blood cultures: NGTD. Ortho recommended to cont IV atx fo few more days. Deescalate atx  Likely in 1-2 days. appreciate ortho input   Subacute fracture of the distal second metatarsal. Obtain PT/OT.  nonweightbearing on the left per ortho   IDDM. Uncontrolled. ha1c-8.4. Patient reports taking up to 10<->30U novolog TID+levemir 60U QHS.  -Resumed 15U mealtime insulin+ISS+ lantus 60U QHS. Titrate as needed.   Tobacco use. Nicotine patch. Stop smoking     Code Status: full Family Communication: d/w patient, RN (indicate person spoken with, relationship, and if by phone, the number) Disposition Plan: home soon 1-2 days   Consultants:  Ortho   Procedures:  Bedside I&D  Antibiotics:  Vanc/zosyn 3/26>>. (indicate start date, and stop date if known)  HPI/Subjective: Alert, no distress   Objective: Filed Vitals:   04/17/15 2118 04/18/15 0629  BP: 127/69 127/74  Pulse: 82 73  Temp: 97.6 F (36.4 C) 98.5 F (36.9 C)  Resp: 18 18    Intake/Output Summary (Last 24 hours) at 04/18/15 1141 Last data filed at 04/18/15 0958  Gross per 24 hour  Intake 3506.67 ml  Output    375 ml  Net 3131.67 ml   Filed Weights   04/16/15 1533  Weight: 110.309 kg (243 lb 3 oz)    Exam:   General:  Comfortable   Cardiovascular: s1,s2 rrr  Respiratory: CTA BL   Abdomen: soft, nt obese   Musculoskeletal: left foot ulcer    Data Reviewed: Basic Metabolic Panel:  Recent Labs Lab  04/16/15 1615 04/17/15 0631  NA 133* 137  K 4.7 4.3  CL 104 110  CO2 20* 21*  GLUCOSE 404* 260*  BUN 12 7  CREATININE 0.64 0.45  CALCIUM 9.2 8.2*   Liver Function Tests:  Recent Labs Lab 04/16/15 1615  AST 14*  ALT 18  ALKPHOS 94  BILITOT 0.8  PROT 6.9  ALBUMIN 3.8    Recent Labs Lab 04/16/15 1615  LIPASE 18   No results for input(s): AMMONIA in the last 168 hours. CBC:  Recent Labs Lab 04/16/15 1615  WBC 9.2  HGB 11.8*  HCT 36.5  MCV 88.6  PLT 259   Cardiac Enzymes: No results for input(s): CKTOTAL, CKMB, CKMBINDEX, TROPONINI in the last 168 hours. BNP (last 3 results) No results for input(s): BNP in the last 8760 hours.  ProBNP (last 3 results) No results for input(s): PROBNP in the last 8760 hours.  CBG:  Recent Labs Lab 04/17/15 1201 04/17/15 1703 04/17/15 2123 04/18/15 0738 04/18/15 1122  GLUCAP 270* 144* 196* 269* 145*    Recent Results (from the past 240 hour(s))  Blood Cultures x 2 sites     Status: None (Preliminary result)   Collection Time: 04/16/15  6:36 PM  Result Value Ref Range Status   Specimen Description BLOOD RIGHT ARM  Final   Special Requests BOTTLES DRAWN AEROBIC AND ANAEROBIC 10CC  Final   Culture NO GROWTH 2 DAYS  Final   Report Status PENDING  Incomplete  Blood Cultures x 2  sites     Status: None (Preliminary result)   Collection Time: 04/16/15  8:35 PM  Result Value Ref Range Status   Specimen Description BLOOD RIGHT ANTECUBITAL  Final   Special Requests BOTTLES DRAWN AEROBIC AND ANAEROBIC 5CC  Final   Culture NO GROWTH 2 DAYS  Final   Report Status PENDING  Incomplete     Studies: Mr Foot Left W Wo Contrast  04/17/2015  CLINICAL DATA:  Soft tissue ulceration of the left foot with soft tissue swelling. EXAM: MRI OF THE LEFT FOREFOOT WITHOUT AND WITH CONTRAST TECHNIQUE: Multiplanar, multisequence MR imaging was performed both before and after administration of intravenous contrast. CONTRAST:  20mL MULTIHANCE  GADOBENATE DIMEGLUMINE 529 MG/ML IV SOLN COMPARISON:  Radiographs dated 04/16/2015 FINDINGS: There is a comminuted slightly displaced fracture of the distal shaft of second metatarsal inflammation in adjacent soft tissues a small amount of fluid adjacent to the fracture. The bones of the forefoot  are otherwise normal. There is 24 mm diameter soft tissue ulceration at the plantar aspect of flipped superficial to the distal fifth metatarsal. No deep extension. There is also a focal 11 mm soft tissue ulceration lateral to the fifth metatarsal phalangeal joint. There is edema and enhancement around the joint there is no osteomyelitis or abscess evidence suggestive of a septic joint. Minimal soft tissue edema of the dorsum of the distal forefoot. IMPRESSION: 1. Soft tissue ulcerations at the lateral and plantar aspects of the foot without underlying osteomyelitis or abscess. 2. Subacute fracture of the distal second metatarsal with secondary inflammatory changes in the adjacent soft tissues. I suspect that the inflammatory changes are secondary to the fracture rather than infection. No discrete abscess. Electronically Signed   By: Francene Boyers M.D.   On: 04/17/2015 08:16   Ap / Lateral X-ray Left Foot  04/16/2015  CLINICAL DATA:  Wound on the lateral aspect of the left foot in a diabetic patient. Chronicity unknown. Initial encounter. EXAM: LEFT FOOT - 2 VIEW COMPARISON:  None. FINDINGS: A large wound is seen along the fifth MTP joint and there appears to be a second wound or collection of soft tissue gas between the distal fourth and fifth metatarsals. No bony destructive change about the fifth MTP joint is identified. The patient has a fracture through the neck of the fifth metatarsal which shows minimal lateral displacement. Bones appear somewhat osteopenic. No other acute bony or joint abnormality is identified. IMPRESSION: Large appearing soft tissue wound lateral to the fifth MTP joint with a second wound  versus soft tissue gas between the distal fourth and fifth metatarsals. Negative for plain film evidence of osteomyelitis. Acute or subacute appearing fracture neck of the second metatarsal with slight lateral displacement. Electronically Signed   By: Drusilla Kanner M.D.   On: 04/16/2015 19:00    Scheduled Meds: . collagenase   Topical Daily  . insulin aspart  0-9 Units Subcutaneous TID WC  . insulin aspart  15 Units Subcutaneous TID WC  . insulin detemir  60 Units Subcutaneous QHS  . nicotine  21 mg Transdermal Daily  . piperacillin-tazobactam (ZOSYN)  IV  3.375 g Intravenous Q8H  . vancomycin  1,000 mg Intravenous Q8H   Continuous Infusions: . sodium chloride 100 mL/hr at 04/18/15 0245    Principal Problem:   Diabetic infection of left foot (HCC) Active Problems:   Diabetes mellitus without complication (HCC)   Tobacco abuse   Diabetic foot infection (HCC)    Time spent: >35 minutes  Esperanza Sheets  [facility] Pager [phone]. If 7PM-7AM, please contact night-coverage at [email or web address], password [facility] 04/18/2015, 11:41 AM  LOS: 1 day

## 2015-04-19 DIAGNOSIS — E1169 Type 2 diabetes mellitus with other specified complication: Secondary | ICD-10-CM

## 2015-04-19 DIAGNOSIS — L089 Local infection of the skin and subcutaneous tissue, unspecified: Secondary | ICD-10-CM

## 2015-04-19 LAB — GLUCOSE, CAPILLARY
Glucose-Capillary: 129 mg/dL — ABNORMAL HIGH (ref 65–99)
Glucose-Capillary: 190 mg/dL — ABNORMAL HIGH (ref 65–99)
Glucose-Capillary: 163 mg/dL — ABNORMAL HIGH (ref 65–99)
Glucose-Capillary: 165 mg/dL — ABNORMAL HIGH (ref 65–99)
Glucose-Capillary: 288 mg/dL — ABNORMAL HIGH (ref 65–99)

## 2015-04-19 NOTE — Progress Notes (Signed)
Pharmacy Antibiotic Note  Molly Savage is a 25 y.o. female admitted on 04/16/2015 with foot pain and noted with DM foot infection (no osteo on X-ray).   Plan: Continue Zosyn 3.375 gm IV q8h (4 hour infusion) Continue vancomycin 1g IV q8h (goal trough 10-15) Monitor clinical picture, renal function, VT at Cs   Height: 5\' 8"  (172.7 cm) Weight: 243 lb 3 oz (110.309 kg) IBW/kg (Calculated) : 63.9  Temp (24hrs), Avg:98 F (36.7 C), Min:97.5 F (36.4 C), Max:98.6 F (37 C)   Recent Labs Lab 04/16/15 1615 04/16/15 2117 04/16/15 2331 04/17/15 0631  WBC 9.2  --   --   --   CREATININE 0.64  --   --  0.45  LATICACIDVEN  --  1.0 0.6  --     Estimated Creatinine Clearance: 138.8 mL/min (by C-G formula based on Cr of 0.45).    No Known Allergies  Antimicrobials this admission: Vanc 3/26>> Zosyn 3/26>>  Microbiology results: 3/26 blood x2  Isaac BlissMichael Amel Kitch, PharmD, BCPS, [facility] Center[facility]CCCP Clinical Pharmacist Pager [phone] 04/19/2015 1:34 PM

## 2015-04-19 NOTE — Progress Notes (Signed)
[facility] PROGRESS NOTE  Molly BloomSusan Savage [id number] DOB: 1990-02-14 DOA: 04/16/2015 PCP: No PCP Per Patient  Assessment/Plan: 25 y/o female with PMH Chronic Tobacco use, IDDM presented with left foot pain. Admitted with DM foot ulcer   DM foot ulcer, cellulitis abscess.  -Patient with history of type 1 diabetes mellitus,  -She was seen and evaluated by Dr. Lajoyce Cornersuda of orthopedic surgery. -S/p bedside I&D. MRI foot: Soft tissue ulcerations at the lateral and plantar aspects of the foot without underlying osteomyelitis or abscess.  -Pt is improving on IV atx (vanc/zosyn 3/26<<). Blood cultures: NGTD. She is nontoxic appearing, afebrile, tolerating by mouth intake -Plan to continue 24 hours of IV antimicrobial therapy, transition to oral antibiotics in a.m. and have her follow-up with Dr. Lajoyce Cornersuda in the outpatient setting.  Subacute fracture of the distal second metatarsal.  -nonweightbearing on the left per ortho   IDDM. Uncontrolled. ha1c-8.4. Patient reports taking up to 10<->30U novolog TID+levemir 60U QHS.  -Resumed 15U mealtime insulin+ISS+ lantus 60U QHS. Titrate as needed.  -Blood sugars controlled  Tobacco use. Nicotine patch. Stop smoking   Code Status: full Family Communication: d/w patient Disposition Plan: Anticipate discharge in a.m.   Consultants:  Ortho   Procedures:  Bedside I&D  Antibiotics:  Vanc/zosyn 3/26>>. (indicate start date, and stop date if known)  HPI/Subjective: Alert, no distress, overall she is doing better  Objective: Filed Vitals:   04/18/15 2200 04/19/15 0454  BP: 115/98 125/70  Pulse: 73 63  Temp: 97.5 F (36.4 C) 98 F (36.7 C)  Resp: 18 18    Intake/Output Summary (Last 24 hours) at 04/19/15 1426 Last data filed at 04/19/15 1140  Gross per 24 hour  Intake   2570 ml  Output   2700 ml  Net   -130 ml   Filed Weights   04/16/15 1533  Weight: 110.309 kg (243 lb 3 oz)    Exam:   General:  Comfortable, No acute  distress  Cardiovascular: s1,s2 rrr  Respiratory: CTA BL   Abdomen: soft, nt obese   Musculoskeletal: left foot ulcer, mild erythema border otherwise no purulence or discharge from ulcer  Data Reviewed: Basic Metabolic Panel:  Recent Labs Lab 04/16/15 1615 04/17/15 0631  NA 133* 137  K 4.7 4.3  CL 104 110  CO2 20* 21*  GLUCOSE 404* 260*  BUN 12 7  CREATININE 0.64 0.45  CALCIUM 9.2 8.2*   Liver Function Tests:  Recent Labs Lab 04/16/15 1615  AST 14*  ALT 18  ALKPHOS 94  BILITOT 0.8  PROT 6.9  ALBUMIN 3.8    Recent Labs Lab 04/16/15 1615  LIPASE 18   No results for input(s): AMMONIA in the last 168 hours. CBC:  Recent Labs Lab 04/16/15 1615  WBC 9.2  HGB 11.8*  HCT 36.5  MCV 88.6  PLT 259   Cardiac Enzymes: No results for input(s): CKTOTAL, CKMB, CKMBINDEX, TROPONINI in the last 168 hours. BNP (last 3 results) No results for input(s): BNP in the last 8760 hours.  ProBNP (last 3 results) No results for input(s): PROBNP in the last 8760 hours.  CBG:  Recent Labs Lab 04/18/15 1122 04/18/15 1703 04/18/15 2206 04/19/15 0739 04/19/15 1137  GLUCAP 145* 143* 129* 190* 163*    Recent Results (from the past 240 hour(s))  Blood Cultures x 2 sites     Status: None (Preliminary result)   Collection Time: 04/16/15  6:36 PM  Result Value Ref Range Status   Specimen Description BLOOD RIGHT  ARM  Final   Special Requests BOTTLES DRAWN AEROBIC AND ANAEROBIC 10CC  Final   Culture NO GROWTH 3 DAYS  Final   Report Status PENDING  Incomplete  Blood Cultures x 2 sites     Status: None (Preliminary result)   Collection Time: 04/16/15  8:35 PM  Result Value Ref Range Status   Specimen Description BLOOD RIGHT ANTECUBITAL  Final   Special Requests BOTTLES DRAWN AEROBIC AND ANAEROBIC 5CC  Final   Culture NO GROWTH 3 DAYS  Final   Report Status PENDING  Incomplete     Studies: No results found.  Scheduled Meds: . collagenase   Topical Daily  . insulin  aspart  0-9 Units Subcutaneous TID WC  . insulin aspart  15 Units Subcutaneous TID WC  . insulin detemir  60 Units Subcutaneous QHS  . nicotine  21 mg Transdermal Daily  . piperacillin-tazobactam (ZOSYN)  IV  3.375 g Intravenous Q8H  . vancomycin  1,000 mg Intravenous Q8H   Continuous Infusions:    Principal Problem:   Diabetic infection of left foot (HCC) Active Problems:   Diabetes mellitus without complication (HCC)   Tobacco abuse   Diabetic foot infection (HCC)    Time spent: 25 minutes     Molly Savage  [facility] Pager [phone]. If 7PM-7AM, please contact night-coverage at [email or web address], password [facility] 04/19/2015, 2:26 PM  LOS: 2 days

## 2015-04-19 NOTE — Progress Notes (Signed)
Physical Therapy Wound Treatment Patient Details  Name: Molly Savage MRN: [id number] Date of Birth: 1990-12-24  Today's Date: 04/19/2015 Time:  -     Subjective  Subjective: Pt agreeable to hydrotherapy Patient and Family Stated Goals: Heal wound Date of Onset:  (Pt reports 3 months)  Pain Score:  Pt was premedicated prior to session.   Wound Assessment  Wound / Incision (Open or Dehisced) 04/17/15 Diabetic ulcer Foot Left;Other (Comment) Plantar surface (Active)  Dressing Type Compression wrap;Foam;Moist to dry 04/19/2015 11:36 AM  Dressing Changed Changed 04/19/2015 11:36 AM  Dressing Status Clean;Dry;Intact 04/19/2015 11:36 AM  Dressing Change Frequency Daily 04/19/2015 11:36 AM  Site / Wound Assessment Red;Yellow;Brown 04/19/2015 11:36 AM  % Wound base Red or Granulating 60% 04/19/2015 11:36 AM  % Wound base Yellow 40% 04/19/2015 11:36 AM  % Wound base Black 0% 04/19/2015 11:36 AM  Peri-wound Assessment Intact;Maceration 04/19/2015 11:36 AM  Wound Length (cm) 2.5 cm 04/17/2015  2:12 PM  Wound Width (cm) 5.3 cm 04/17/2015  2:12 PM  Wound Depth (cm) 0.2 cm 04/17/2015  2:12 PM  Margins Unattached edges (unapproximated) 04/19/2015 11:36 AM  Closure None 04/19/2015 11:36 AM  Drainage Amount Minimal 04/19/2015 11:36 AM  Drainage Description Serosanguineous 04/19/2015 11:36 AM  Treatment Hydrotherapy (Pulse lavage);Packing (Saline gauze) 04/19/2015 11:36 AM  Santyl applied to wound bed prior to applying dressing.   Hydrotherapy Pulsed lavage therapy - wound location: L foot plantar surface Pulsed Lavage with Suction (psi): 8 psi (4 at times due to pain) Pulsed Lavage with Suction - Normal Saline Used: 1000 mL Pulsed Lavage Tip: Tip with splash shield   Wound Assessment and Plan  Wound Therapy - Assess/Plan/Recommendations Wound Therapy - Clinical Statement: Progressing with wound bed healing. Wound Therapy - Functional Problem List: Decreased tolerance of OOB, limited weight bearing  through L foot.  Factors Delaying/Impairing Wound Healing: Diabetes Mellitus Hydrotherapy Plan: Dressing change;Debridement;Patient/family education;Pulsatile lavage with suction Wound Therapy - Frequency: 6X / week Wound Therapy - Follow Up Recommendations: Home health RN Wound Plan: See above  Wound Therapy Goals- Improve the function of patient's integumentary system by progressing the wound(s) through the phases of wound healing (inflammation - proliferation - remodeling) by: Decrease Necrotic Tissue to: 0% Increase Granulation Tissue to: 100% Goals/treatment plan/discharge plan were made with and agreed upon by patient/family: Yes Time For Goal Achievement: 7 days Wound Therapy - Potential for Goals: Good  Goals will be updated until maximal potential achieved or discharge criteria met.  Discharge criteria: when goals achieved, discharge from hospital, MD decision/surgical intervention, no progress towards goals, refusal/missing three consecutive treatments without notification or medical reason.  GP     Rolinda Roan 04/19/2015, 11:39 AM   Rolinda Roan, PT, DPT Acute Rehabilitation Services Pager: [phone]

## 2015-04-20 LAB — CREATININE, SERUM
Creatinine, Ser: 0.66 mg/dL (ref 0.44–1.00)
GFR calc Af Amer: >60 mL/min (ref >60)
GFR calc non Af Amer: >60 mL/min (ref >60)

## 2015-04-20 LAB — GLUCOSE, CAPILLARY
Glucose-Capillary: 323 mg/dL — ABNORMAL HIGH (ref 65–99)
Glucose-Capillary: 238 mg/dL — ABNORMAL HIGH (ref 65–99)

## 2015-04-20 LAB — VANCOMYCIN, TROUGH: Vancomycin Tr: 14 ug/mL (ref 10.0–20.0)

## 2015-04-20 MED ORDER — CEPHALEXIN 500 MG PO CAPS
500.0000 mg | ORAL_CAPSULE | Freq: Three times a day (TID) | ORAL | Status: DC
Start: 2015-04-20 — End: 2015-10-09

## 2015-04-20 MED ORDER — OXYCODONE-ACETAMINOPHEN 5-325 MG PO TABS: 2.0000 | ORAL_TABLET | Freq: Three times a day (TID) | ORAL | Status: DC | PRN

## 2015-04-20 MED ORDER — COLLAGENASE 250 UNIT/GM EX OINT: 1.0000 "application " | TOPICAL_OINTMENT | Freq: Every day | CUTANEOUS | Status: DC

## 2015-04-20 NOTE — Progress Notes (Signed)
Pharmacy Antibiotic Note  Molly Savage is a 25 y.o. female admitted on 04/16/2015 with foot pain and noted with DM foot infection (no osteo on X-ray).  S/p bedside I&D. Afebrile, WBC WNL, creat 0.66, vancomycin trough 14 on 1 gm q8h - therapeutic, BC no growth x 3 days.   Plan: Continue Zosyn 3.375 gm IV q8h (4 hour infusion) Continue vancomycin 1g IV q8h (goal trough 10-15) Anticipate change to PO abx today   Height: 5\' 8"  (172.7 cm) Weight: 243 lb 3 oz (110.309 kg) IBW/kg (Calculated) : 63.9  Temp (24hrs), Avg:97.9 F (36.6 C), Min:97.7 F (36.5 C), Max:98 F (36.7 C)   Recent Labs Lab 04/16/15 1615 04/16/15 2117 04/16/15 2331 04/17/15 0631 04/20/15 0955  WBC 9.2  --   --   --   --   CREATININE 0.64  --   --  0.45 0.66  LATICACIDVEN  --  1.0 0.6  --   --   VANCOTROUGH  --   --   --   --  14    Estimated Creatinine Clearance: 138.8 mL/min (by C-G formula based on Cr of 0.66).    No Known Allergies  Antimicrobials this admission: Vanc 3/26>> Zosyn 3/26>>  Levels: 3/30 VT = 14 mcg/ml - therapeutic on 1 gm q8h   Microbiology results: 3/26 blood x2>>no growth x 3 days   Molly Savage, Pharm.D. [phone] 04/20/2015 11:34 AM

## 2015-04-20 NOTE — Progress Notes (Signed)
Physical Therapy Wound Treatment Patient Details  Name: Molly Savage MRN: [id number] Date of Birth: 07-31-1990  Today's Date: 04/20/2015 Time: 1779-3903 Time Calculation (min): 12 min  Subjective  Subjective: Pt agreeable to hydrotherapy Patient and Family Stated Goals: Heal wound Date of Onset:  (Pt reports 3 months)  Pain Score:  Pt was premedicated prior to session.   Wound Assessment  Wound / Incision (Open or Dehisced) 04/17/15 Diabetic ulcer Foot Left;Other (Comment) Plantar surface (Active)  Dressing Type Compression wrap;Foam;Moist to dry 04/20/2015 10:00 AM  Dressing Changed Changed 04/20/2015 10:00 AM  Dressing Status Clean;Dry;Intact 04/20/2015 10:00 AM  Dressing Change Frequency Daily 04/20/2015 10:00 AM  Site / Wound Assessment Red;Yellow;Brown 04/20/2015 10:00 AM  % Wound base Red or Granulating 75% 04/20/2015 10:00 AM  % Wound base Yellow 25% 04/20/2015 10:00 AM  % Wound base Black 0% 04/20/2015 10:00 AM  Peri-wound Assessment Intact;Maceration 04/20/2015 10:00 AM  Wound Length (cm) 2.5 cm 04/17/2015  2:12 PM  Wound Width (cm) 5.3 cm 04/17/2015  2:12 PM  Wound Depth (cm) 0.2 cm 04/17/2015  2:12 PM  Margins Unattached edges (unapproximated) 04/20/2015 10:00 AM  Closure None 04/20/2015 10:00 AM  Drainage Amount Minimal 04/20/2015 10:00 AM  Drainage Description Serosanguineous 04/20/2015 10:00 AM  Treatment Hydrotherapy (Pulse lavage);Packing (Saline gauze) 04/20/2015 10:00 AM  Santyl applied to wound bed prior to applying dressing.   Hydrotherapy Pulsed lavage therapy - wound location: L foot plantar surface Pulsed Lavage with Suction (psi): 8 psi Pulsed Lavage with Suction - Normal Saline Used: 1000 mL Pulsed Lavage Tip: Tip with splash shield   Wound Assessment and Plan  Wound Therapy - Assess/Plan/Recommendations Wound Therapy - Clinical Statement: Progressing with wound bed healing. Wound Therapy - Functional Problem List: Decreased tolerance of OOB, limited weight  bearing through L foot.  Factors Delaying/Impairing Wound Healing: Diabetes Mellitus Hydrotherapy Plan: Dressing change;Debridement;Patient/family education;Pulsatile lavage with suction Wound Therapy - Frequency: 6X / week Wound Therapy - Follow Up Recommendations: Home health RN Wound Plan: See above  Wound Therapy Goals- Improve the function of patient's integumentary system by progressing the wound(s) through the phases of wound healing (inflammation - proliferation - remodeling) by: Decrease Necrotic Tissue to: 0% Decrease Necrotic Tissue - Progress: Progressing toward goal Increase Granulation Tissue to: 100% Increase Granulation Tissue - Progress: Progressing toward goal Goals/treatment plan/discharge plan were made with and agreed upon by patient/family: Yes Time For Goal Achievement: 7 days Wound Therapy - Potential for Goals: Good  Goals will be updated until maximal potential achieved or discharge criteria met.  Discharge criteria: when goals achieved, discharge from hospital, MD decision/surgical intervention, no progress towards goals, refusal/missing three consecutive treatments without notification or medical reason.  GP     Rolinda Roan 04/20/2015, 10:56 AM   Rolinda Roan, PT, DPT Acute Rehabilitation Services Pager: [phone]

## 2015-04-20 NOTE — Progress Notes (Signed)
Orthopedic Tech Progress Note Patient Details:  Dorna BloomSusan Soules 1991/01/05 [id number]  Ortho Devices Type of Ortho Device: Crutches Ortho Device/Splint Interventions: Application   Saul FordyceJennifer C Katrinka Herbison 04/20/2015, 1:44 PM

## 2015-04-20 NOTE — Care Management Note (Signed)
Case Management Note  Patient Details  Name: Molly Savage MRN: [id number] Date of Birth: 11/09/1990  Subjective/Objective:                    Action/Plan:  Confirmed with Dr Vanessa BarbaraZamora no [facility] care  Expected Discharge Date:                  Expected Discharge Plan:  Home/Self Care  In-House Referral:     Discharge planning Services  CM Consult, [facility], [facility], Medication Assistance  Post Acute Care Choice:    Choice offered to:  Patient  DME Arranged:    DME Agency:     HH Arranged:    HH Agency:     Status of Service:  Completed, signed off  Medicare Important Message Given:    Date Medicare IM Given:    Medicare IM give by:    Date Additional Medicare IM Given:    Additional Medicare Important Message give by:     If discussed at Long Length of Stay Meetings, dates discussed:    Additional Comments:  Molly Savage, Molly Iseminger [name], RN 04/20/2015, 11:47 AM

## 2015-04-20 NOTE — Discharge Summary (Signed)
Physician Discharge Summary  Molly BloomSusan Mowers ZOX:[id number] DOB: 02/02/1990 DOA: 04/16/2015  PCP: No PCP Per Patient  Admit date: 04/16/2015 Discharge date: 04/20/2015  Time spent: 35 minutes  Recommendations for Outpatient Follow-up:  1. Please follow up on left foot ulcer, she was discharged on oral antibiotic therapy   Discharge Diagnoses:  Principal Problem:   Diabetic infection of left foot (HCC) Active Problems:   Diabetes mellitus without complication (HCC)   Tobacco abuse   Diabetic foot infection (HCC)   Discharge Condition: Stable  Diet recommendation: Carb mod diet  Filed Weights   04/16/15 1533  Weight: 110.309 kg (243 lb 3 oz)    History of present illness:  Molly Savage is a 25 y.o. female with PMH of tobacco abuse, diabetes mellitus, who presents with left foot pain.  Pt reports that 2 weeks ago she noticed the callus in left foot. It became painful and since then she has developed a blister, which has progressed to have formed a wound. She reports one month ago she saw a podiatrist who filed the callus but it has returned. She said that she was hospitalized for similar infection in 2015. Currently patient hassevere pain, but no fever, chills. She has chronic nausea due to possible diabetic gastroparesis, but no vomiting, abdominal pain or diarrhea. She reports taking numerous doses of Tylenol and ibuprofen to be able to work. No medical treatment for this new infection prior to arrival. Patient does not have chest pain, shortness breath, cough, unilateral weakness, symptoms of UTI.  In ED, patient was found to have WBC 9.2, temperature normal, pseudohyponatremia, creatinine 0.64, lipase normal, blood sugar 404. X-ray of left foot showed large appearing soft tissue wound lateral to the fifth MTP joint with a second wound versus soft tissue gas between the distal fourth and fifth metatarsals. Negative for plain film evidence of osteomyelitis; acute or subacute  appearing fracture neck of the second metatarsal with slight lateral displacement. Patient is admitted to inpatient for further urination the treatment and observation. Orthopedic surgeon, Dr. Lajoyce Cornersuda was consulted, will see in the morning.   Hospital Course:  Ms Mercy MooreBartholomay is a 25 year old female with a past medical history of type 1 diabetes mellitus, admitted to the medicine service on 04/16/2015 when she presented with complaints of worsening pain over left foot ulcer. On examination she was found to have an ulcer underneath the fifth metatarsal head of left foot. MRI of foot did not reveal evidence of osteomyelitis. During this hospitalization she was seen and evaluated by Dr. Lajoyce Cornersuda of orthopedic surgery who did not recommend surgical intervention at this time. She was given santyl dressing changes. During this hospitalization she was treated with IV antibiotic coverage with IV vancomycin and Zosyn. She showed clinical improvement remained hemodynamically stable, afebrile. She was transitioned to oral antibiotic coverage with Keflex on 04/20/2015 and given instructions to follow-up with orthopedic surgery in a week.   Consultations:  Orthopedic surgery  Discharge Exam: Filed Vitals:   04/19/15 2309 04/20/15 0555  BP: 125/75 127/68  Pulse: 87 73  Temp: 97.7 F (36.5 C) 98 F (36.7 C)  Resp: 18 18     General: Comfortable, No acute distress  Cardiovascular: s1,s2 rrr  Respiratory: CTA BL   Abdomen: soft, nt obese   Musculoskeletal: left foot ulcer over fifth metatarsal head, mild erythema border otherwise no purulence or discharge from ulcer  Discharge Instructions   Discharge Instructions    Call MD for:  difficulty breathing, headache or visual disturbances  Complete by:  As directed      Call MD for:  extreme fatigue    Complete by:  As directed      Call MD for:  hives    Complete by:  As directed      Call MD for:  persistant dizziness or light-headedness     Complete by:  As directed      Call MD for:  persistant nausea and vomiting    Complete by:  As directed      Call MD for:  redness, tenderness, or signs of infection (pain, swelling, redness, odor or green/yellow discharge around incision site)    Complete by:  As directed      Call MD for:  severe uncontrolled pain    Complete by:  As directed      Call MD for:  temperature >100.4    Complete by:  As directed      Call MD for:    Complete by:  As directed      Diet - low sodium heart healthy    Complete by:  As directed      Increase activity slowly    Complete by:  As directed      Non weight bearing    Complete by:  As directed   Laterality:  left  Extremity:  Lower          Current Discharge Medication List    START taking these medications   Details  cephALEXin (KEFLEX) 500 MG capsule Take 1 capsule (500 mg total) by mouth 3 (three) times daily. Qty: 30 capsule, Refills: 0    collagenase (SANTYL) ointment Apply 1 application topically daily. Applied to wound daily after cleansing with soap and water dry with dressing change Qty: 15 g, Refills: 0    oxyCODONE-acetaminophen (PERCOCET/ROXICET) 5-325 MG tablet Take 2 tablets by mouth every 8 (eight) hours as needed for severe pain. Qty: 15 tablet, Refills: 0      CONTINUE these medications which have NOT CHANGED   Details  acetaminophen (TYLENOL) 500 MG tablet Take 1,000 mg by mouth every 6 (six) hours as needed for moderate pain.    ibuprofen (ADVIL,MOTRIN) 200 MG tablet Take 400 mg by mouth every 6 (six) hours as needed.    insulin aspart (NOVOLOG) 100 UNIT/ML injection Inject 10-30 Units into the skin 3 (three) times daily before meals. Takes 30 units when eating Takes 10 units when not eating    insulin detemir (LEVEMIR) 100 UNIT/ML injection Inject 60 Units into the skin at bedtime.       No Known Allergies Follow-up Information    Follow up with DUDA,MARCUS V, MD In 1 week.   Specialty:  Orthopedic Surgery    Contact information:   [address] [place] [address] [place] [address] [phone]       Schedule an appointment as soon as possible for a visit with [facility].   Contact information:   [address] [place] [address] [phone]       The results of significant diagnostics from this hospitalization (including imaging, microbiology, ancillary and laboratory) are listed below for reference.    Significant Diagnostic Studies: Mr Foot Left W Wo Contrast  04/17/2015  CLINICAL DATA:  Soft tissue ulceration of the left foot with soft tissue swelling. EXAM: MRI OF THE LEFT FOREFOOT WITHOUT AND WITH CONTRAST TECHNIQUE: Multiplanar, multisequence MR imaging was performed both before and after administration of intravenous contrast.  CONTRAST:  20mL MULTIHANCE GADOBENATE DIMEGLUMINE 529 MG/ML IV SOLN COMPARISON:  Radiographs dated 04/16/2015 FINDINGS: There is a comminuted slightly displaced fracture of the distal shaft of second metatarsal inflammation in adjacent soft tissues a small amount of fluid adjacent to the fracture. The bones of the forefoot  are otherwise normal. There is 24 mm diameter soft tissue ulceration at the plantar aspect of flipped superficial to the distal fifth metatarsal. No deep extension. There is also a focal 11 mm soft tissue ulceration lateral to the fifth metatarsal phalangeal joint. There is edema and enhancement around the joint there is no osteomyelitis or abscess evidence suggestive of a septic joint. Minimal soft tissue edema of the dorsum of the distal forefoot. IMPRESSION: 1. Soft tissue ulcerations at the lateral and plantar aspects of the foot without underlying osteomyelitis or abscess. 2. Subacute fracture of the distal second metatarsal with secondary inflammatory changes in the adjacent soft tissues. I suspect that the inflammatory changes are secondary to the fracture rather than infection. No discrete  abscess. Electronically Signed   By: Francene Boyers M.D.   On: 04/17/2015 08:16   Ap / Lateral X-ray Left Foot  04/16/2015  CLINICAL DATA:  Wound on the lateral aspect of the left foot in a diabetic patient. Chronicity unknown. Initial encounter. EXAM: LEFT FOOT - 2 VIEW COMPARISON:  None. FINDINGS: A large wound is seen along the fifth MTP joint and there appears to be a second wound or collection of soft tissue gas between the distal fourth and fifth metatarsals. No bony destructive change about the fifth MTP joint is identified. The patient has a fracture through the neck of the fifth metatarsal which shows minimal lateral displacement. Bones appear somewhat osteopenic. No other acute bony or joint abnormality is identified. IMPRESSION: Large appearing soft tissue wound lateral to the fifth MTP joint with a second wound versus soft tissue gas between the distal fourth and fifth metatarsals. Negative for plain film evidence of osteomyelitis. Acute or subacute appearing fracture neck of the second metatarsal with slight lateral displacement. Electronically Signed   By: Drusilla Kanner M.D.   On: 04/16/2015 19:00    Microbiology: Recent Results (from the past 240 hour(s))  Blood Cultures x 2 sites     Status: None (Preliminary result)   Collection Time: 04/16/15  6:36 PM  Result Value Ref Range Status   Specimen Description BLOOD RIGHT ARM  Final   Special Requests BOTTLES DRAWN AEROBIC AND ANAEROBIC 10CC  Final   Culture NO GROWTH 3 DAYS  Final   Report Status PENDING  Incomplete  Blood Cultures x 2 sites     Status: None (Preliminary result)   Collection Time: 04/16/15  8:35 PM  Result Value Ref Range Status   Specimen Description BLOOD RIGHT ANTECUBITAL  Final   Special Requests BOTTLES DRAWN AEROBIC AND ANAEROBIC 5CC  Final   Culture NO GROWTH 3 DAYS  Final   Report Status PENDING  Incomplete     Labs: Basic Metabolic Panel:  Recent Labs Lab 04/16/15 1615 04/17/15 0631  04/20/15 0955  NA 133* 137  --   K 4.7 4.3  --   CL 104 110  --   CO2 20* 21*  --   GLUCOSE 404* 260*  --   BUN 12 7  --   CREATININE 0.64 0.45 0.66  CALCIUM 9.2 8.2*  --    Liver Function Tests:  Recent Labs Lab 04/16/15 1615  AST 14*  ALT 18  ALKPHOS  94  BILITOT 0.8  PROT 6.9  ALBUMIN 3.8    Recent Labs Lab 04/16/15 1615  LIPASE 18   No results for input(s): AMMONIA in the last 168 hours. CBC:  Recent Labs Lab 04/16/15 1615  WBC 9.2  HGB 11.8*  HCT 36.5  MCV 88.6  PLT 259   Cardiac Enzymes: No results for input(s): CKTOTAL, CKMB, CKMBINDEX, TROPONINI in the last 168 hours. BNP: BNP (last 3 results) No results for input(s): BNP in the last 8760 hours.  ProBNP (last 3 results) No results for input(s): PROBNP in the last 8760 hours.  CBG:  Recent Labs Lab 04/19/15 0739 04/19/15 1137 04/19/15 1727 04/19/15 2156 04/20/15 0706  GLUCAP 190* 163* 165* 288* 323*       Signed:  [name] MD.  [facility] 04/20/2015, 11:42 AM

## 2015-04-20 NOTE — Progress Notes (Signed)
Patient discharged to home with instructions and her crutches.

## 2015-04-21 LAB — CULTURE, BLOOD (ROUTINE X 2)
Culture: NO GROWTH
Culture: NO GROWTH

## 2015-06-26 LAB — OB RESULTS CONSOLE HGB/HCT, BLOOD
HCT: 38 %
Hemoglobin: 12.5 g/dL

## 2015-06-26 LAB — OB RESULTS CONSOLE PLATELET COUNT: Platelets: 322 10*3/uL

## 2015-06-26 LAB — OB RESULTS CONSOLE GC/CHLAMYDIA
Chlamydia: NEGATIVE
Gonorrhea: NEGATIVE

## 2015-06-26 LAB — OB RESULTS CONSOLE ABO/RH

## 2015-06-26 LAB — OB RESULTS CONSOLE HIV ANTIBODY (ROUTINE TESTING): HIV: NONREACTIVE

## 2015-06-26 LAB — OB RESULTS CONSOLE HEPATITIS B SURFACE ANTIGEN: Hepatitis B Surface Ag: NEGATIVE

## 2015-06-26 LAB — OB RESULTS CONSOLE ANTIBODY SCREEN: Antibody Screen: NEGATIVE

## 2015-07-03 LAB — CYTOLOGY - PAP: CYTOLOGY - PAP: NORMAL

## 2015-07-30 DIAGNOSIS — F4323 Adjustment disorder with mixed anxiety and depressed mood: Secondary | ICD-10-CM | POA: Insufficient documentation

## 2015-07-30 DIAGNOSIS — F329 Major depressive disorder, single episode, unspecified: Secondary | ICD-10-CM | POA: Insufficient documentation

## 2015-07-30 DIAGNOSIS — F32A Depression, unspecified: Secondary | ICD-10-CM | POA: Insufficient documentation

## 2015-07-30 DIAGNOSIS — F418 Other specified anxiety disorders: Secondary | ICD-10-CM

## 2015-08-28 ENCOUNTER — Encounter: Payer: Self-pay | Admitting: *Deleted

## 2015-08-31 ENCOUNTER — Encounter: Payer: Self-pay | Admitting: Obstetrics & Gynecology

## 2015-08-31 ENCOUNTER — Ambulatory Visit (INDEPENDENT_AMBULATORY_CARE_PROVIDER_SITE_OTHER): Payer: Medicaid Other | Admitting: Obstetrics & Gynecology

## 2015-08-31 VITALS — BP 128/87 | HR 108 | Wt 262.1 lb

## 2015-08-31 DIAGNOSIS — O24912 Unspecified diabetes mellitus in pregnancy, second trimester: Secondary | ICD-10-CM

## 2015-08-31 DIAGNOSIS — O24919 Unspecified diabetes mellitus in pregnancy, unspecified trimester: Secondary | ICD-10-CM

## 2015-08-31 DIAGNOSIS — O0992 Supervision of high risk pregnancy, unspecified, second trimester: Secondary | ICD-10-CM

## 2015-08-31 LAB — POCT URINALYSIS DIP (DEVICE)
Bilirubin Urine: NEGATIVE
Glucose, UA: NEGATIVE mg/dL
Hgb urine dipstick: NEGATIVE
Ketones, ur: NEGATIVE mg/dL
Leukocytes, UA: NEGATIVE
Nitrite: NEGATIVE
Protein, ur: NEGATIVE mg/dL
Specific Gravity, Urine: 1.025 (ref 1.005–1.030)
Urobilinogen, UA: 1 mg/dL (ref 0.0–1.0)
pH: 6 (ref 5.0–8.0)

## 2015-08-31 LAB — COMPREHENSIVE METABOLIC PANEL
ALT: 13 U/L (ref 6–29)
AST: 11 U/L (ref 10–30)
Albumin: 3.5 g/dL — ABNORMAL LOW (ref 3.6–5.1)
Alkaline Phosphatase: 95 U/L (ref 33–115)
BUN: 9 mg/dL (ref 7–25)
CO2: 20 mmol/L (ref 20–31)
Calcium: 9 mg/dL (ref 8.6–10.2)
Chloride: 104 mmol/L (ref 98–110)
Creat: 0.43 mg/dL — ABNORMAL LOW (ref 0.50–1.10)
Glucose, Bld: 187 mg/dL — ABNORMAL HIGH (ref 65–99)
Potassium: 4.3 mmol/L (ref 3.5–5.3)
Sodium: 133 mmol/L — ABNORMAL LOW (ref 135–146)
Total Bilirubin: 0.3 mg/dL (ref 0.2–1.2)
Total Protein: 6.3 g/dL (ref 6.1–8.1)

## 2015-08-31 LAB — TSH: TSH: 2.27 mIU/L

## 2015-08-31 MED ORDER — INSULIN DETEMIR 100 UNIT/ML ~~LOC~~ SOLN: 82.0000 [IU] | Freq: Every day | SUBCUTANEOUS | 11 refills | Status: DC

## 2015-08-31 MED ORDER — ASPIRIN EC 81 MG PO TBEC: 81.0000 mg | DELAYED_RELEASE_TABLET | Freq: Every day | ORAL | 9 refills | Status: DC

## 2015-08-31 NOTE — Progress Notes (Signed)
Pt has type 1 diabetes.

## 2015-09-01 LAB — PROTEIN / CREATININE RATIO, URINE
Creatinine, Urine: 196 mg/dL (ref 20–320)
Protein Creatinine Ratio: 92 mg/g creat (ref 21–161)
Total Protein, Urine: 18 mg/dL (ref 5–24)

## 2015-09-01 LAB — HEMOGLOBIN A1C
Hgb A1c MFr Bld: 5.8 % — ABNORMAL HIGH (ref <5.7)
Mean Plasma Glucose: 120 mg/dL

## 2015-09-02 ENCOUNTER — Encounter: Payer: Self-pay | Admitting: Obstetrics & Gynecology

## 2015-09-02 DIAGNOSIS — O24013 Pre-existing diabetes mellitus, type 1, in pregnancy, third trimester: Secondary | ICD-10-CM | POA: Insufficient documentation

## 2015-09-02 NOTE — Progress Notes (Signed)
  Subjective:    Molly BloomSusan Savage is a G1P0 4373w0d being seen today for her first obstetrical visit.  Her obstetrical history is significant for obesity, smoker and typ1 diabetes not well controlled.  [facility] on Quad Screen.  Pt has refused insulin pump offered by her PCP.  Pt also does not carb count as directed by diabetes education.  Pt does not have CBG log for me to review today.  She states fastings are 130s and pp are 140s.    Patient reports no complaints.  Vitals:   08/31/15 0944  BP: 128/87  Pulse: (!) 108  Weight: 262 lb 1.6 oz (118.9 kg)    HISTORY: OB History  Gravida Para Term Preterm AB Living  1            SAB TAB Ectopic Multiple Live Births               # Outcome Date GA Lbr Len/2nd Weight Sex Delivery Anes PTL Lv  1 Current              Past Medical History:  Diagnosis Date  . Diabetes mellitus without complication (HCC)   . Tobacco abuse    History reviewed. No pertinent surgical history. Family History  Problem Relation Age of Onset  . Hypertension Mother   . Bipolar disorder Sister      Exam  [facility]  Will note repeat exam today.                                                                                     Assessment:    Pregnancy: G1P0 Patient Active Problem List   Diagnosis Date Noted  . Diabetic foot infection (HCC) 04/17/2015  . Diabetic infection of left foot (HCC) 04/16/2015  . Diabetes mellitus without complication (HCC)   . Tobacco abuse         Plan:     Initial labs reviewed Prenatal vitamins. Problem list reviewed and updated. Genetic Screening:  Increased of NTD  Ultrasound discussed--Detailed US by MFM ordered, esp to evaluate for NtD  Type 1 Diabetes:  Increase evening Levimer to 82 units.  Meet with diabetes educator ASAP to review card counting and sliding scale.  Pt sates she can't come next  Monday but could possibly come the week after that. Pt aware of risk of IUFD with hyperglycemia.   optho Fetal echo Prot/cr ratio CMP Hgb A1c Baby ASA  Smoking:  Cessation reviewed.  Not ready to quit.  Life is too stressful now.  Will refer to our behavioral health worker.   Follow up in 1 weeks. 50% of 30 min visit spent on counseling and coordination of care.    Charley Lafrance H. 09/02/2015

## 2015-09-05 ENCOUNTER — Encounter (HOSPITAL_COMMUNITY): Payer: Self-pay | Admitting: Obstetrics & Gynecology

## 2015-09-11 ENCOUNTER — Encounter: Payer: Medicaid Other | Attending: Obstetrics & Gynecology | Admitting: Dietician

## 2015-09-11 ENCOUNTER — Ambulatory Visit: Payer: Medicaid Other | Admitting: *Deleted

## 2015-09-11 DIAGNOSIS — O24919 Unspecified diabetes mellitus in pregnancy, unspecified trimester: Secondary | ICD-10-CM | POA: Diagnosis present

## 2015-09-11 DIAGNOSIS — Z3A00 Weeks of gestation of pregnancy not specified: Secondary | ICD-10-CM | POA: Insufficient documentation

## 2015-09-11 NOTE — Progress Notes (Signed)
Diabetes Education: 09/11/15 Darl PikesSusan is a G1/P0 lady currently at 18 weeks, with EDD 02/10/2016.  Has a HX of type 1 DM for aabout 10 years.  [facility]. Currently on Novolog 30 units with meals and Levimer 82 units at HS.   Notes that she "knows how to count carbs and has difficulty counting and dosing her insulin."   Review of the need for regular glucose testing and coverage with insulin to prevent increased weight gain for the baby.  Has no glucose records.  Provided a glucose log that provides for glucose readings and carb counting levels. Review/recommendations for regular exercise ; 30 minutes daily. Provided handout "Nutrition, Diabetes and Pregnancy" along with a carb counting card. During out appointment received multiple phon calls.  After 45 minutes, she received a call and had to leave to pick-up the FOB when he got off from work. Will plan to try to see her at her next visit for glucose level review and more instruction regarding the meal pattern prescription. Maggie Dagon Budai, RN, RD, LDN

## 2015-09-18 ENCOUNTER — Encounter (HOSPITAL_COMMUNITY): Payer: Self-pay

## 2015-09-18 ENCOUNTER — Ambulatory Visit (INDEPENDENT_AMBULATORY_CARE_PROVIDER_SITE_OTHER): Payer: Medicaid Other | Admitting: Obstetrics and Gynecology

## 2015-09-18 ENCOUNTER — Ambulatory Visit (HOSPITAL_COMMUNITY)
Admission: RE | Admit: 2015-09-18 | Discharge: 2015-09-18 | Disposition: A | Discharge status: Home / Self Care | Payer: Medicaid Other | Source: Ambulatory Visit | Attending: Obstetrics & Gynecology | Admitting: Obstetrics & Gynecology

## 2015-09-18 VITALS — BP 124/87 | HR 108 | Wt 262.0 lb

## 2015-09-18 VITALS — BP 125/78 | HR 97 | Wt 262.0 lb

## 2015-09-18 DIAGNOSIS — O24919 Unspecified diabetes mellitus in pregnancy, unspecified trimester: Secondary | ICD-10-CM

## 2015-09-18 DIAGNOSIS — Z6841 Body Mass Index (BMI) 40.0 and over, adult: Secondary | ICD-10-CM | POA: Diagnosis not present

## 2015-09-18 DIAGNOSIS — O99210 Obesity complicating pregnancy, unspecified trimester: Secondary | ICD-10-CM

## 2015-09-18 DIAGNOSIS — Z36 Encounter for antenatal screening of mother: Secondary | ICD-10-CM | POA: Diagnosis not present

## 2015-09-18 DIAGNOSIS — O99212 Obesity complicating pregnancy, second trimester: Secondary | ICD-10-CM | POA: Diagnosis not present

## 2015-09-18 DIAGNOSIS — O0992 Supervision of high risk pregnancy, unspecified, second trimester: Secondary | ICD-10-CM

## 2015-09-18 DIAGNOSIS — Z3A19 19 weeks gestation of pregnancy: Secondary | ICD-10-CM | POA: Diagnosis not present

## 2015-09-18 DIAGNOSIS — Z720 Tobacco use: Secondary | ICD-10-CM

## 2015-09-18 DIAGNOSIS — E669 Obesity, unspecified: Secondary | ICD-10-CM

## 2015-09-18 DIAGNOSIS — O24312 Unspecified pre-existing diabetes mellitus in pregnancy, second trimester: Secondary | ICD-10-CM | POA: Insufficient documentation

## 2015-09-18 DIAGNOSIS — O99332 Smoking (tobacco) complicating pregnancy, second trimester: Secondary | ICD-10-CM | POA: Insufficient documentation

## 2015-09-18 DIAGNOSIS — O24912 Unspecified diabetes mellitus in pregnancy, second trimester: Secondary | ICD-10-CM

## 2015-09-18 LAB — POCT URINALYSIS DIP (DEVICE)
Bilirubin Urine: NEGATIVE
Glucose, UA: NEGATIVE mg/dL
Ketones, ur: 15 mg/dL — AB
Leukocytes, UA: NEGATIVE
Nitrite: NEGATIVE
Protein, ur: NEGATIVE mg/dL
Specific Gravity, Urine: 1.02 (ref 1.005–1.030)
Urobilinogen, UA: 0.2 mg/dL (ref 0.0–1.0)
pH: 6 (ref 5.0–8.0)

## 2015-09-18 NOTE — Progress Notes (Signed)
Prenatal Visit Note Date: 09/18/2015 [facility]  Subjective:  Molly Savage is a 25 y.o. G1P0 at 74102w2d being seen today for ongoing prenatal care.  She is currently monitored for the following issues for this high-risk pregnancy and has Diabetes mellitus without complication (HCC); Diabetic infection of left foot (HCC); Tobacco abuse; Diabetic foot infection (HCC); Supervision of high risk pregnancy, antepartum; and Diabetes mellitus affecting pregnancy, antepartum on her problem list.  Patient reports no complaints.   Contractions: Not present. Vag. Bleeding: None.  Movement: Present. Denies leaking of fluid.   The following portions of the patient's history were reviewed and updated as appropriate: allergies, current medications, past family history, past medical history, past social history, past surgical history and problem list. Problem list updated.  Objective:   Vitals:   09/18/15 1041  BP: 125/78  Pulse: 97  Weight: 262 lb (118.8 kg)    Fetal Status: Fetal Heart Rate (bpm): 162   Movement: Present     General:  Alert, oriented and cooperative. Patient is in no acute distress.  Skin: Skin is warm and dry. No rash noted.   Cardiovascular: Normal heart rate noted  Respiratory: Normal respiratory effort, no problems with respiration noted  Abdomen: Soft, gravid, appropriate for gestational age. Pain/Pressure: Absent     Pelvic:  Cervical exam deferred        Extremities: Normal range of motion.  Edema: None  Mental Status: Normal mood and affect. Normal behavior. Normal judgment and thought content.   Urinalysis:      Assessment and Plan:  Pregnancy: G1 at 1102w2d  1. Supervision of high risk pregnancy, antepartum, second trimester D/w her the need for close f/u and need for her to be compliant with her care given risk of maternal fetal morbidity and mortality, specifically DKA, birth defects, IUFD. Incomplete anatomy scan today so 4wk rpt  scheduled along with growth  - US MFM OB FOLLOW UP; Future   2. Diabetes mellitus affecting pregnancy, antepartum Pt didn't bring her log book in or her meter. a1c on 8/10 was 5.8 and CMP, TSH and PC ratio and pap normal except for elevated BS on CMP. Pt states she is taking novolog 26-30 units 4x/day and levemir 82 units qhs. She states her fasting BS is in the 130s and before meals and qhs check in the 100s-150s.  I stressed to her the above risks of non compliance and told her that she needs to bring her log books in for review as unable to adjust regimen if no values to see and review. She has a care everywhere note from 7/7 from her endocrinologist that recommend 3wk CDE visit and a 6wk provider visit. Pt is unaware of the latter. I told her to please call the clinic and see when it is and if she [facility] to make another one ASAP. Fetal echo scheduled for one month Pt declines optho visit due to cost with pregnancy medicaid.  Screening ECG today.  Pt states she isn't taking baby ASA and risks associated (pre-x) d/w her and told to start ASAP   Preterm labor symptoms and general obstetric precautions including but not limited to vaginal bleeding, contractions, leaking of fluid and fetal movement were reviewed in detail with the patient. Please refer to After Visit Summary for other counseling recommendations.  Return in about 10 days (around 09/28/2015).   [place] Bingharlie Garland [place], MD

## 2015-09-19 ENCOUNTER — Encounter: Payer: Self-pay | Admitting: Family Medicine

## 2015-10-02 NOTE — Addendum Note (Signed)
Addended by: Darrel HooverASSETTE, KELLY P on: 10/02/2015 04:00 PM   Modules accepted: Orders

## 2015-10-03 ENCOUNTER — Telehealth: Payer: Self-pay

## 2015-10-03 NOTE — Addendum Note (Signed)
Addended by: Faythe CasaBELLAMY, [name] M on: 10/03/2015 03:41 PM   Modules accepted: Orders

## 2015-10-03 NOTE — Progress Notes (Signed)
Received a call from MFM stating that we needed to cancel the fetal echo orders that were placed because they do not do them.  Canceled both orders and reordered.  Will contact pt in regards to fetal echo appt.

## 2015-10-03 NOTE — Addendum Note (Signed)
Addended by: Faythe CasaBELLAMY, Arrabella Westerman M on: 10/03/2015 05:03 PM   Modules accepted: Orders

## 2015-10-09 ENCOUNTER — Ambulatory Visit (INDEPENDENT_AMBULATORY_CARE_PROVIDER_SITE_OTHER): Payer: Medicaid Other | Admitting: Obstetrics & Gynecology

## 2015-10-09 VITALS — BP 124/72 | HR 105 | Wt 263.1 lb

## 2015-10-09 DIAGNOSIS — O24912 Unspecified diabetes mellitus in pregnancy, second trimester: Secondary | ICD-10-CM

## 2015-10-09 DIAGNOSIS — O0992 Supervision of high risk pregnancy, unspecified, second trimester: Secondary | ICD-10-CM

## 2015-10-09 DIAGNOSIS — Z23 Encounter for immunization: Secondary | ICD-10-CM | POA: Diagnosis not present

## 2015-10-09 DIAGNOSIS — O24919 Unspecified diabetes mellitus in pregnancy, unspecified trimester: Secondary | ICD-10-CM

## 2015-10-09 LAB — POCT URINALYSIS DIP (DEVICE)
Bilirubin Urine: NEGATIVE
Glucose, UA: 100 mg/dL — AB
Hgb urine dipstick: NEGATIVE
Ketones, ur: NEGATIVE mg/dL
Nitrite: NEGATIVE
Protein, ur: NEGATIVE mg/dL
Specific Gravity, Urine: 1.025 (ref 1.005–1.030)
Urobilinogen, UA: 1 mg/dL (ref 0.0–1.0)
pH: 6.5 (ref 5.0–8.0)

## 2015-10-09 MED ORDER — INSULIN DETEMIR 100 UNIT/ML ~~LOC~~ SOLN: SUBCUTANEOUS | 11 refills | Status: DC

## 2015-10-09 NOTE — Progress Notes (Signed)
   PRENATAL VISIT NOTE  Subjective:  Molly Savage is a 25 y.o. G1P0 at 228w2d being seen today for ongoing prenatal care.  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  Patient reports no complaints.  Contractions: Not present. Vag. Bleeding: None.  Movement: Present. Denies leaking of fluid.   The following portions of the patient's history were reviewed and updated as appropriate: allergies, current medications, past family history, past medical history, past social history, past surgical history and problem list. Problem list updated.  Objective:   Vitals:   10/09/15 1112  BP: 124/72  Pulse: (!) 105  Weight: 263 lb 1.6 oz (119.3 kg)    Fetal Status: Fetal Heart Rate (bpm): 156   Movement: Present     General:  Alert, oriented and cooperative. Patient is in no acute distress.  Skin: Skin is warm and dry. No rash noted.   Cardiovascular: Normal heart rate noted  Respiratory: Normal respiratory effort, no problems with respiration noted  Abdomen: Soft, gravid, appropriate for gestational age. Pain/Pressure: Present     Pelvic:  Cervical exam deferred        Extremities: Normal range of motion.  Edema: None  Mental Status: Normal mood and affect. Normal behavior. Normal judgment and thought content.   Urinalysis:      Assessment and Plan:  Pregnancy: G1P0 at 698w2d  1. Diabetes mellitus affecting pregnancy, second trimester (HCC) -Pt did not bring CBG log again.  Pt claims she didn't know she should.  She did not follow up with endocrinologist and now says she is not sure if she has one  anymore.  Problem list from [facility] says she has retinopathy. - insulin detemir (LEVEMIR) 100 UNIT/ML injection; Take 90 units of inulin at bedtime  Dispense: 10 mL; Refill: 11 (fastings are elevated per patient memory.  150 this am.  Will increase to 90 units) -Pt does some sort of car counting.  Pt encouraged to increase her meal coverage to 34 units.  -Pt refuses to go to opthamology b/c insurance won't cover it.   -Fetal echo--she thinks she missed this appointment--will reschedule.  -Pt encouraged to be compliant. -serial growth US already scheduled  4. Flu vaccine need - Flu Vaccine QUAD 36+ mos IM (Fluarix & Fluzone Quad PF  Preterm labor symptoms and general obstetric precautions including but not limited to vaginal bleeding, contractions, leaking of fluid and fetal movement were reviewed in detail with the patient. Please refer to After Visit Summary for other counseling recommendations.  Return in about 10 days (around 10/19/2015).  Lesly DukesKelly H Shirah Roseman, MD

## 2015-10-09 NOTE — Progress Notes (Signed)
Flu vaccine consented and info given  Medicaid Home Form

## 2015-10-11 NOTE — Telephone Encounter (Signed)
Opened in error

## 2015-10-11 NOTE — Telephone Encounter (Deleted)
Opened in error

## 2015-10-16 ENCOUNTER — Ambulatory Visit (HOSPITAL_COMMUNITY)
Admission: RE | Admit: 2015-10-16 | Discharge: 2015-10-16 | Disposition: A | Discharge status: Home / Self Care | Payer: Medicaid Other | Source: Ambulatory Visit | Attending: Obstetrics & Gynecology | Admitting: Obstetrics & Gynecology

## 2015-10-16 ENCOUNTER — Encounter (HOSPITAL_COMMUNITY): Payer: Self-pay

## 2015-10-16 DIAGNOSIS — Z3A23 23 weeks gestation of pregnancy: Secondary | ICD-10-CM | POA: Diagnosis not present

## 2015-10-16 DIAGNOSIS — O24312 Unspecified pre-existing diabetes mellitus in pregnancy, second trimester: Secondary | ICD-10-CM | POA: Diagnosis not present

## 2015-10-16 DIAGNOSIS — O99332 Smoking (tobacco) complicating pregnancy, second trimester: Secondary | ICD-10-CM | POA: Diagnosis present

## 2015-10-16 DIAGNOSIS — Z36 Encounter for antenatal screening of mother: Secondary | ICD-10-CM | POA: Insufficient documentation

## 2015-10-16 DIAGNOSIS — O0992 Supervision of high risk pregnancy, unspecified, second trimester: Secondary | ICD-10-CM

## 2015-10-19 ENCOUNTER — Encounter: Payer: Medicaid Other | Admitting: Family Medicine

## 2015-10-20 ENCOUNTER — Telehealth: Payer: Self-pay | Admitting: *Deleted

## 2015-10-20 ENCOUNTER — Ambulatory Visit (INDEPENDENT_AMBULATORY_CARE_PROVIDER_SITE_OTHER): Payer: Medicaid Other | Admitting: Family Medicine

## 2015-10-20 VITALS — BP 134/77 | HR 101 | Wt 255.5 lb

## 2015-10-20 DIAGNOSIS — E10319 Type 1 diabetes mellitus with unspecified diabetic retinopathy without macular edema: Secondary | ICD-10-CM

## 2015-10-20 DIAGNOSIS — O24919 Unspecified diabetes mellitus in pregnancy, unspecified trimester: Secondary | ICD-10-CM

## 2015-10-20 DIAGNOSIS — K219 Gastro-esophageal reflux disease without esophagitis: Secondary | ICD-10-CM

## 2015-10-20 DIAGNOSIS — O0992 Supervision of high risk pregnancy, unspecified, second trimester: Secondary | ICD-10-CM

## 2015-10-20 MED ORDER — OMEPRAZOLE MAGNESIUM 20 MG PO TBEC: 20.0000 mg | DELAYED_RELEASE_TABLET | Freq: Every day | ORAL | 3 refills | Status: DC

## 2015-10-20 NOTE — Progress Notes (Signed)
Pt c/o severe reflux and vomiting. States she has seen blood in her emesis at least once a day, usually after throwing up several times.

## 2015-10-20 NOTE — Progress Notes (Signed)
   PRENATAL VISIT NOTE  Subjective:  Molly Savage is a 25 y.o. G1P0 at 3734w6d being seen today for ongoing prenatal care.  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  Patient reports heartburn, vomiting and unable to afford Prilosec without a prescription..  Contractions: Not present. Vag. Bleeding: None.  Movement: Present. Denies leaking of fluid.   The following portions of the patient's history were reviewed and updated as appropriate: allergies, current medications, past family history, past medical history, past social history, past surgical history and problem list. Problem list updated.  Objective:   Vitals:   10/20/15 1032  BP: 134/77  Pulse: (!) 101  Weight: 255 lb 8 oz (115.9 kg)    Fetal Status: Fetal Heart Rate (bpm): 152   Movement: Present     General:  Alert, oriented and cooperative. Patient is in no acute distress.  Skin: Skin is warm and dry. No rash noted.   Cardiovascular: Normal heart rate noted  Respiratory: Normal respiratory effort, no problems with respiration noted  Abdomen: Soft, gravid, appropriate for gestational age. Pain/Pressure: Present     Pelvic:  Cervical exam deferred        Extremities: Normal range of motion.  Edema: None  Mental Status: Normal mood and affect. Normal behavior. Normal judgment and thought content.   BS are in meter and she has multiple highs and lows. Last HgbA1C is 5.8 in 8/17--may repeat this in 1 month. Assessment and Plan:  Pregnancy: G1P0 at 7234w6d  1. Supervision of high risk pregnancy, antepartum, second trimester Continue prenatal care  2. Diabetes mellitus affecting pregnancy,  antepartum Continue current insulin--see Maggie about possible insulin pump   3. Gastroesophageal reflux disease without esophagitis - omeprazole (PRILOSEC OTC) 20 MG tablet; Take 1 tablet (20 mg total) by mouth daily.  Dispense: 30 tablet; Refill: 3  4. 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 Given that pregnancy will likely increase or worsen disease--recommend treatment now. - Ambulatory referral to Ophthalmology  Preterm labor symptoms and general obstetric precautions including but not limited to vaginal bleeding, contractions, leaking of fluid and fetal movement were reviewed in detail with the patient. Please refer to After Visit Summary for other counseling recommendations.  [facility]03[facility]2015[facility]  Reva Boresanya S Venissa Nappi, MD

## 2015-10-20 NOTE — Patient Instructions (Signed)

## 2015-10-20 NOTE — Telephone Encounter (Signed)
[facility] [place]. Scheduled 11/21/15 @ 10:00. Called patient and notified of appt. Understanding voiced.

## 2015-10-23 ENCOUNTER — Telehealth: Payer: Self-pay | Admitting: *Deleted

## 2015-10-23 DIAGNOSIS — K219 Gastro-esophageal reflux disease without esophagitis: Secondary | ICD-10-CM

## 2015-10-23 NOTE — Telephone Encounter (Signed)
Molly Savage called this am and left a message that she Dr. Shawnie PonsPratt had written a prescription for otc prilosec. She needs it changed to regular prilosec so her medicaid will cover it. Request a call when we have it straightened out.

## 2015-10-25 MED ORDER — OMEPRAZOLE 20 MG PO CPDR: 20.0000 mg | DELAYED_RELEASE_CAPSULE | Freq: Every day | ORAL | 3 refills | Status: DC

## 2015-10-25 NOTE — Telephone Encounter (Signed)
I called Molly Savage and we discussed her request. Refill approved and sent.

## 2015-10-25 NOTE — Addendum Note (Signed)
Addended by: Raynald BlendZEYFANG, Levita Monical L on: 10/25/2015 11:27 AM   Modules accepted: Orders

## 2015-11-06 ENCOUNTER — Ambulatory Visit (INDEPENDENT_AMBULATORY_CARE_PROVIDER_SITE_OTHER): Payer: Medicaid Other | Admitting: Obstetrics & Gynecology

## 2015-11-06 VITALS — BP 129/79 | HR 101 | Wt 269.5 lb

## 2015-11-06 DIAGNOSIS — O24012 Pre-existing diabetes mellitus, type 1, in pregnancy, second trimester: Secondary | ICD-10-CM

## 2015-11-06 DIAGNOSIS — O0993 Supervision of high risk pregnancy, unspecified, third trimester: Secondary | ICD-10-CM

## 2015-11-06 LAB — CBC
HCT: 32.5 % — ABNORMAL LOW (ref 35.0–45.0)
Hemoglobin: 10.8 g/dL — ABNORMAL LOW (ref 11.7–15.5)
MCH: 28.7 pg (ref 27.0–33.0)
MCHC: 33.2 g/dL (ref 32.0–36.0)
MCV: 86.4 fL (ref 80.0–100.0)
MPV: 10.3 fL (ref 7.5–12.5)
Platelets: 332 10*3/uL (ref 140–400)
RBC: 3.76 MIL/uL — ABNORMAL LOW (ref 3.80–5.10)
RDW: 14.5 % (ref 11.0–15.0)
WBC: 13.7 10*3/uL — ABNORMAL HIGH (ref 3.8–10.8)

## 2015-11-06 MED ORDER — "INSULIN SYRINGE-NEEDLE U-100 27G X 1/2"" 1 ML MISC": 1.0000 | Freq: Four times a day (QID) | 5 refills | Status: DC

## 2015-11-06 MED ORDER — TETANUS-DIPHTH-ACELL PERTUSSIS 5-2.5-18.5 LF-MCG/0.5 IM SUSP: 0.5000 mL | Freq: Once | INTRAMUSCULAR | Status: DC

## 2015-11-06 NOTE — Progress Notes (Signed)
   PRENATAL VISIT NOTE  Subjective:  Molly Savage is a 25 y.o. G1P0 at 3822w2d being seen today for ongoing prenatal care.  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  Patient reports needs syringes and needles.  Has been estimating the amount of insulin abour 30 cc.  Pt has also used the same needle for 2 weeks..  Contractions: Not present. Vag. Bleeding: None.  Movement: Present. Denies leaking of fluid.   The following portions of the patient's history were reviewed and updated as appropriate: allergies, current medications, past family history, past medical history, past social history, past surgical history and problem list. Problem list updated.  Objective:   Vitals:   11/06/15 1028  BP: 129/79  Pulse: (!) 101  Weight: 269 lb 8 oz (122.2 kg)    Fetal Status: Fetal Heart Rate (bpm): 153   Movement: Present     General:  Alert, oriented and cooperative. Patient is in no acute distress.  Skin: Skin is warm and dry. No rash noted.   Cardiovascular: Normal heart rate noted  Respiratory: Normal respiratory effort, no problems with respiration noted  Abdomen: Soft, gravid, appropriate for gestational age. Pain/Pressure: Present     Pelvic:  Cervical exam deferred        Extremities: Normal range of motion.  Edema: None  Mental Status: Normal mood and affect. Normal behavior. Normal judgment and thought content.   Assessment and Plan:  Pregnancy: G1P0 at 2422w2d  1. Pregnancy, supervision, high-risk, third trimester - HIV antibody (with reflex) - CBC - RPR - Pt did not leave urine specimen  2.  Type 1 DM Pt was 64 this morning after  self increasing insulin to 100 units of Levimir last night.  All other fastings have been 112-192.  Pt not checking after meals (except twice after lunch and was nml).  Before lunch 108-219, before dinner 46-200, before bed 54-124.  Pt does not follow a diabetic diet.  She had two pieces of pizza for breakfast today.  Pt says she has cut back carbs some.  She refuses to wait for diabetic educator today.  Will refer to Thursday diabetes educator (hopefully if she doesn't have to wait long, she will agree to see eductor).  Pt needs to be on insulin pump.  Pt understands that hyperglycemia can lead to fetal death and macrosomia.  Pt has serial US for growth already scheduled.  Pt should have also had a fetal echo.  RN will look for results and if patient missed appt, will reschedule.    Preterm labor symptoms and general obstetric precautions including but not limited to vaginal bleeding, contractions, leaking of fluid and fetal movement were reviewed in detail with the patient. Please refer to After Visit Summary for other counseling recommendations.  Return in about 1 week (around 11/13/2015).  Lesly DukesKelly H Madgeline Rayo, MD

## 2015-11-07 ENCOUNTER — Telehealth: Payer: Self-pay

## 2015-11-07 DIAGNOSIS — O24019 Pre-existing diabetes mellitus, type 1, in pregnancy, unspecified trimester: Secondary | ICD-10-CM

## 2015-11-07 LAB — RPR

## 2015-11-07 LAB — HIV ANTIBODY (ROUTINE TESTING W REFLEX): HIV 1&2 Ab, 4th Generation: NONREACTIVE

## 2015-11-07 NOTE — Telephone Encounter (Signed)
Contacted Dr. Casilda Carlsotton's office and was informed that pt came to September 27th @ 0900 appt.  Notes from appt faxed to our office.  Pt scheduled for Diabetes Management @ [facility] on 11/09/15 @ 1530.Marland Kitchen.  Pt informed of appt and that her insulin syringes have been sent to her pharmacy.

## 2015-11-09 ENCOUNTER — Ambulatory Visit (HOSPITAL_COMMUNITY): Admission: RE | Admit: 2015-11-09 | Payer: Medicaid Other | Source: Ambulatory Visit

## 2015-11-09 ENCOUNTER — Telehealth: Payer: Self-pay | Admitting: *Deleted

## 2015-11-13 ENCOUNTER — Ambulatory Visit (HOSPITAL_COMMUNITY): Admission: RE | Admit: 2015-11-13 | Payer: Medicaid Other | Source: Ambulatory Visit

## 2015-11-20 ENCOUNTER — Ambulatory Visit (INDEPENDENT_AMBULATORY_CARE_PROVIDER_SITE_OTHER): Payer: Medicaid Other | Admitting: Student

## 2015-11-20 ENCOUNTER — Ambulatory Visit (INDEPENDENT_AMBULATORY_CARE_PROVIDER_SITE_OTHER): Payer: Medicaid Other | Admitting: Clinical

## 2015-11-20 ENCOUNTER — Encounter: Payer: Self-pay | Admitting: Student

## 2015-11-20 VITALS — BP 125/79 | HR 101 | Wt 272.0 lb

## 2015-11-20 DIAGNOSIS — F4322 Adjustment disorder with anxiety: Secondary | ICD-10-CM

## 2015-11-20 DIAGNOSIS — O9989 Other specified diseases and conditions complicating pregnancy, childbirth and the puerperium: Secondary | ICD-10-CM

## 2015-11-20 DIAGNOSIS — O24013 Pre-existing diabetes mellitus, type 1, in pregnancy, third trimester: Secondary | ICD-10-CM

## 2015-11-20 DIAGNOSIS — O0990 Supervision of high risk pregnancy, unspecified, unspecified trimester: Secondary | ICD-10-CM

## 2015-11-20 DIAGNOSIS — N9089 Other specified noninflammatory disorders of vulva and perineum: Secondary | ICD-10-CM

## 2015-11-20 DIAGNOSIS — I10 Essential (primary) hypertension: Secondary | ICD-10-CM

## 2015-11-20 DIAGNOSIS — O10913 Unspecified pre-existing hypertension complicating pregnancy, third trimester: Secondary | ICD-10-CM

## 2015-11-20 LAB — POCT URINALYSIS DIP (DEVICE)
Bilirubin Urine: NEGATIVE
Glucose, UA: NEGATIVE mg/dL
Nitrite: NEGATIVE
Protein, ur: NEGATIVE mg/dL
Specific Gravity, Urine: 1.015 (ref 1.005–1.030)
Urobilinogen, UA: 1 mg/dL (ref 0.0–1.0)
pH: 7 (ref 5.0–8.0)

## 2015-11-20 NOTE — Patient Instructions (Addendum)
Contraception Choices Contraception (birth control) is the use of any methods or devices to prevent pregnancy. Below are some methods to help avoid pregnancy. HORMONAL METHODS   Contraceptive implant. This is a thin, plastic tube containing progesterone hormone. It does not contain estrogen hormone. Your health care provider inserts the tube in the inner part of the upper arm. The tube can remain in place for up to 3 years. After 3 years, the implant must be removed. The implant prevents the ovaries from releasing an egg (ovulation), thickens the cervical mucus to prevent sperm from entering the uterus, and thins the lining of the inside of the uterus.  Progesterone-only injections. These injections are given every 3 months by your health care provider to prevent pregnancy. This synthetic progesterone hormone stops the ovaries from releasing eggs. It also thickens cervical mucus and changes the uterine lining. This makes it harder for sperm to survive in the uterus.  Birth control pills. These pills contain estrogen and progesterone hormone. They work by preventing the ovaries from releasing eggs (ovulation). They also cause the cervical mucus to thicken, preventing the sperm from entering the uterus. Birth control pills are prescribed by a health care provider.Birth control pills can also be used to treat heavy periods.  Minipill. This type of birth control pill contains only the progesterone hormone. They are taken every day of each month and must be prescribed by your health care provider.  Birth control patch. The patch contains hormones similar to those in birth control pills. It must be changed once a week and is prescribed by a health care provider.  Vaginal ring. The ring contains hormones similar to those in birth control pills. It is left in the vagina for 3 weeks, removed for 1 week, and then a new one is put back in place. The patient must be comfortable inserting and removing the ring  from the vagina.A health care provider's prescription is necessary.  Emergency contraception. Emergency contraceptives prevent pregnancy after unprotected sexual intercourse. This pill can be taken right after sex or up to 5 days after unprotected sex. It is most effective the sooner you take the pills after having sexual intercourse. Most emergency contraceptive pills are available without a prescription. Check with your pharmacist. Do not use emergency contraception as your only form of birth control. BARRIER METHODS   Female condom. This is a thin sheath (latex or rubber) that is worn over the penis during sexual intercourse. It can be used with spermicide to increase effectiveness.  Female condom. This is a soft, loose-fitting sheath that is put into the vagina before sexual intercourse.  Diaphragm. This is a soft, latex, dome-shaped barrier that must be fitted by a health care provider. It is inserted into the vagina, along with a spermicidal jelly. It is inserted before intercourse. The diaphragm should be left in the vagina for 6 to 8 hours after intercourse.  Cervical cap. This is a round, soft, latex or plastic cup that fits over the cervix and must be fitted by a health care provider. The cap can be left in place for up to 48 hours after intercourse.  Sponge. This is a soft, circular piece of polyurethane foam. The sponge has spermicide in it. It is inserted into the vagina after wetting it and before sexual intercourse.  Spermicides. These are chemicals that kill or block sperm from entering the cervix and uterus. They come in the form of creams, jellies, suppositories, foam, or tablets. They do not require a   prescription. They are inserted into the vagina with an applicator before having sexual intercourse. The process must be repeated every time you have sexual intercourse. INTRAUTERINE CONTRACEPTION  Intrauterine device (IUD). This is a T-shaped device that is put in a woman's uterus  during a menstrual period to prevent pregnancy. There are 2 types:  Copper IUD. This type of IUD is wrapped in copper wire and is placed inside the uterus. Copper makes the uterus and fallopian tubes produce a fluid that kills sperm. It can stay in place for 10 years.  Hormone IUD. This type of IUD contains the hormone progestin (synthetic progesterone). The hormone thickens the cervical mucus and prevents sperm from entering the uterus, and it also thins the uterine lining to prevent implantation of a fertilized egg. The hormone can weaken or kill the sperm that get into the uterus. It can stay in place for 3-5 years, depending on which type of IUD is used. PERMANENT METHODS OF CONTRACEPTION  Female tubal ligation. This is when the woman's fallopian tubes are surgically sealed, tied, or blocked to prevent the egg from traveling to the uterus.  Hysteroscopic sterilization. This involves placing a small coil or insert into each fallopian tube. Your doctor uses a technique called hysteroscopy to do the procedure. The device causes scar tissue to form. This results in permanent blockage of the fallopian tubes, so the sperm cannot fertilize the egg. It takes about 3 months after the procedure for the tubes to become blocked. You must use another form of birth control for these 3 months.  Female sterilization. This is when the female has the tubes that carry sperm tied off (vasectomy).This blocks sperm from entering the vagina during sexual intercourse. After the procedure, the man can still ejaculate fluid (semen). NATURAL PLANNING METHODS  Natural family planning. This is not having sexual intercourse or using a barrier method (condom, diaphragm, cervical cap) on days the woman could become pregnant.  Calendar method. This is keeping track of the length of each menstrual cycle and identifying when you are fertile.  Ovulation method. This is avoiding sexual intercourse during ovulation.  Symptothermal  method. This is avoiding sexual intercourse during ovulation, using a thermometer and ovulation symptoms.  Post-ovulation method. This is timing sexual intercourse after you have ovulated. Regardless of which type or method of contraception you choose, it is important that you use condoms to protect against the transmission of sexually transmitted infections (STIs). Talk with your health care provider about which form of contraception is most appropriate for you.   This information is not intended to replace advice given to you by your health care provider. Make sure you discuss any questions you have with your health care provider.   Document Released: 01/07/2005 Document Revised: 01/12/2013 Document Reviewed: 07/02/2012 Elsevier Interactive Patient Education 2016 Elsevier Inc. Type 1 or Type 2 Diabetes Mellitus During Pregnancy Diabetes mellitus, often simply referred to as diabetes, is a long-term (chronic) disease. Type 1 diabetes occurs when the islet cells, which are in the pancreas and make the hormone insulin, are destroyed and can no longer make insulin. Type 2 diabetes occurs when the pancreas does not make enough insulin, the cells are less responsive to the insulin that is made (insulin resistance), or both. Insulin is needed to move sugars from food into the tissue cells. The tissue cells use the sugars for energy. The lack of insulin or the lack of normal response to insulin causes excess sugars to build up in the blood  instead of going into the tissue cells. As a result, high blood sugar (hyperglycemia) develops.  If blood glucose levels are kept in the normal range both before and during pregnancy, women can have a healthy pregnancy. If your blood glucose levels are not well controlled, there may be risks to you, your unborn baby, and your labor and delivery. Also, there may be risks to your baby once he or she is born.  RISK FACTORS  You are predisposed to developing type 1 diabetes  if someone in your family has diabetes and you are exposed to certain environmental triggers.  You have an increased chance of developing type 2 diabetes if you have a family history of diabetes and also have one or more of the following risk factors:  Being overweight.  Having an inactive lifestyle.  Having a history of consistently eating high-calorie foods. SYMPTOMS  Increased thirst (polydipsia).  Increased urination (polyuria).  Increased urination during the night (nocturia).  Weight loss. This weight loss may be rapid.  Frequent, recurring infections.  Tiredness (fatigue).  Weakness.  Vision changes, such as blurred vision.  Fruity smell to your breath.  Abdominal pain.  Nausea or vomiting. DIAGNOSIS  If you have risk factors for diabetes, you may be screened for undiagnosed type 2 diabetes at your first prenatal visit. If you have previously given birth and you had gestational diabetes, you should be screened. The screening should be performed 6-12 weeks after the child is born and repeated every 1-3 years after the first test. Diabetes is diagnosed when blood glucose levels are increased. Your blood glucose level may be checked by one or more of the following blood tests:  A fasting blood glucose test. You will not be allowed to eat for at least 8 hours before a blood sample is taken.  A random blood glucose test. Your blood glucose is checked at any time of the day regardless of when you ate.  A hemoglobin A1c blood glucose test. A hemoglobin A1c test provides information about blood glucose control over the previous 3 months.  An oral glucose tolerance test (OGTT). Your blood glucose is measured after you have not eaten (fasted) for 1-3 hours and then after you drink a glucose-containing beverage. An OGTT is usually performed during weeks 24-28 of your pregnancy. TREATMENT   You will need to take diabetes medicine or insulin daily to keep blood glucose levels  in the desired range.  You will need to match insulin dosing with exercise and healthy food choices. If you have type 1 or type 2 diabetes, your treatment goal is to maintain the following blood glucose levels during pregnancy:  Before meals (preprandial), at bedtime, and overnight: 60-99 mg/dL.  After meals (postprandial): peak of 100-129 mg/dL.  A1c: less than 7%. HOME CARE INSTRUCTIONS   Have your hemoglobin A1c level checked twice a year.  Perform daily blood glucose monitoring as directed by your health care provider. It is common to perform frequent blood glucose monitoring.  Monitor urine ketones when you are sick and as directed by your health care provider.  Take your diabetes medicine and insulin as directed by your health care provider to maintain your blood glucose level in the desired range.  Never run out of diabetes medicine or insulin. It is needed every day.  Adjust insulin based on your intake of carbohydrates. Carbohydrates can raise blood glucose levels but need to be included in your diet. Carbohydrates provide vitamins, minerals, and fiber, which are an essential  part of a healthy diet. Carbohydrates are found in fruits, vegetables, whole grains, dairy products, legumes, and foods containing added sugars.  Eat healthy foods. Alternate 3 meals with 3 snacks.  Maintain a healthy weight gain. The usual total expected weight gain varies according to your prepregnancy body mass index (BMI).  Carry a medical alert card or wear medical alert jewelry.  Carry a 15-gram carbohydrate snack with you at all times to treat low blood sugar (hypoglycemia). Some examples of 15-gram carbohydrate snacks include:  Glucose tablets, 3 or 4.  Glucose gel, 15-gram tube.  Raisins, 2 Tbsp (24 grams).  Jelly beans, 6.  Animal crackers, 8.  Fruit juice, regular soda, or low-fat milk, 4 ounces (120 mL).  Gummy treats, 9.  Recognize hypoglycemia. Hypoglycemia during  pregnancy occurs with blood glucose levels of 60 mg/dL and below. The risk for hypoglycemia increases when fasting or skipping meals, during or after intense exercise, and during sleep. Hypoglycemia symptoms can include:  Tremors or shakes.  Decreased ability to concentrate.  Sweating.  Increased heart rate.  Headache.  Dry mouth.  Hunger.  Irritability.  Anxiety.  Restless sleep.  Altered speech or coordination.  Confusion.  Treat hypoglycemia promptly. If you are alert and able to safely swallow, follow the 15:15 rule:  Take 15-20 grams of rapid-acting glucose or carbohydrate. Rapid-acting options include glucose gel, glucose tablets, or 4 ounces (120 mL) of fruit juice, regular soda, or low-fat milk.  Check your blood glucose level 15 minutes after taking the glucose.  Take an additional 15-20 grams of glucose if the repeat blood glucose level is still 70 mg/dL or below.  Eat a meal or snack within 1 hour once blood glucose levels return to normal.  Engage in at least 30 minutes of physical activity a day or as directed by your health care provider. Ten minutes of physical activity timed 30 minutes after each meal is encouraged to control postprandial blood glucose levels.  Watch for polyuria (excess urination) and polydipsia (feeling extra thirsty), which are early signs of hyperglycemia. An early awareness of hyperglycemia allows for prompt treatment. Treat hyperglycemia as directed by your health care provider.  Adjust your insulin dosing and food intake, as needed, if you start a new exercise or sport.  Follow your sick-day plan any time you are unable to eat or drink as usual.  Avoid tobacco and alcohol use.  Keep all follow-up visits as directed by your health care provider.  Follow the advice of your health care provider regarding your prenatal and post-delivery (postpartum) appointments, meal planning, exercise, medicines, vitamins, blood tests, other  medical tests, and physical activities.  Continue daily skin and foot care. Examine your skin and feet daily for cuts, bruises, redness, nail problems, bleeding, blisters, or sores. A foot exam by a health care provider should be done annually.  Brush your teeth and gums at least twice a day and floss at least once a day. Follow up with your dentist regularly.  Schedule an eye exam during the first trimester of your pregnancy or as directed by your health care provider.  Share your diabetes management plan with your workplace or school.  Stay up-to-date with immunizations.  Learn to manage stress.  Obtain ongoing diabetes education and support as needed.  Your health care provider may recommend that you take one low-dose aspirin (81 mg) each day to help prevent high blood pressure during your pregnancy (preeclampsia or eclampsia). You may be at risk for preeclampsia or eclampsia  if:  You had preeclampsia or eclampsia during a previous pregnancy.  Your baby did not grow as expected during a previous pregnancy.  You experienced preterm birth with a previous pregnancy.  You experienced a separation of the placenta from the uterus (placental abruption) during a previous pregnancy.  You experienced the loss of your baby during a previous pregnancy.  You are pregnant with more than one baby.  You have other medical conditions, such as high blood pressure or autoimmune disease. SEEK MEDICAL CARE IF:   You are unable to eat food or drink fluids for more than 6 hours.  You have nausea and vomiting for more than 6 hours.  You have a blood glucose level of 200 mg/dL and you have ketones in your urine.  There is a change in mental status.  You develop vision problems.  You have a persistent headache.  You have upper abdominal pain or discomfort.  You have an additional serious sickness.  You have diarrhea for more than 6 hours.  You have been sick or have had a fever for 2 days  and are not getting better. SEEK IMMEDIATE MEDICAL CARE IF:  You have difficulty breathing.  You no longer feel your baby moving.  You are bleeding or have discharge from your vagina.  You start having premature contractions or labor. MAKE SURE YOU:  Understand these instructions.  Will watch your condition.  Will get help right away if you are not doing well or get worse.   This information is not intended to replace advice given to you by your health care provider. Make sure you discuss any questions you have with your health care provider.   Document Released: 10/02/2011 Document Revised: 01/28/2014 Document Reviewed: 10/02/2011 Elsevier Interactive Patient Education [employer]2016 [employer] Inc.

## 2015-11-20 NOTE — BH Specialist Note (Signed)
Session Start time: 9:46   End Time: 10:35 Total Time: 49 minutes Type of Service: Behavioral Health - Individual/Family Interpreter: No.   Interpreter Name & Language: n/a # [facility]   SUBJECTIVE: Molly BloomSusan Savage is a 25 y.o. female  Pt. was referred by Ollen BowlErin Lawrence,NP  for:  anxiety. Pt. reports the following symptoms/concerns: Pt states that she has previously experienced both depression and anxiety, and that anxious feelings have increased during current pregnancy, including feeling she is not getting adequate sleep; pt copes by talking "too much", as talking helps relieve anxious pressure. Pt uninterested in stop smoking, and does not want additional strategies to cope with anxiety. Duration of problem:  Over two months Severity: moderately severe Previous treatment: Possible BH meds in past for depression, uncertain   OBJECTIVE: Mood: Anxious & Affect: Appropriate Risk of harm to self or others: No known risk of harm to self or others Assessments administered: PHQ9: 0/ GAD7: 14  LIFE CONTEXT:  Family & Social: Lives with FOB; mother lives within 30 minutes, and will help after baby is born. Pt father experiencing alcoholism, and pt sister experiencing bipolar and possible substance use, so both unable to help. FOB family lives nearby and supportive, first baby for both pt and FOB School/ Work: FOB working; pt plans to find work after birth of baby (if reliable childcare) Self-Care: Unable to be physically active because of broken foot, sleep difficulties(see above), eats well, no substances. Keeps resilient outlook on life, not bothered easily.  Life changes: Current pregnancy What is important to pt/family (values): Taking care of baby   GOALS ADDRESSED:  -Alleviate symptoms of anxiety  INTERVENTIONS: Motivational Interviewing and Strength-based   ASSESSMENT:  Pt currently experiencing Adjustment disorder with anxious mood.  Pt may benefit from  psychoeducation and brief therapeutic intervention regarding coping with symptoms of anxiety.      PLAN: 1. F/U with behavioral health clinician: As needed 2. Behavioral Health meds: none 3. Behavioral recommendations:  -Consider discussing questions asked in Postpartum Planner with FOB and mother, to prepare for postpartum period with baby -Read educational material regarding coping with symptoms of anxiety 4. Referral: Brief Counseling/Psychotherapy and Psychoeducation   Woc-Behavioral Health Clinician  Behavioral Health Clinician  Marlon PelWarmhandoff:   Warm Hand Off Completed.        Depression screen [facility] 2[facility]9 11/20/2015 10/20/2015 10/09/2015 09/18/2015  Decreased Interest 0 0 0 0  Down, Depressed, Hopeless 0 1 1 0  PHQ - 2 Score 0 1 1 0  Altered sleeping 0 1 2 0  Tired, decreased energy 0 2 2 0  Change in appetite 0 3 2 0  Feeling bad or failure about yourself  0 0 0 0  Trouble concentrating 0 0 0 0  Moving slowly or fidgety/restless 0 0 0 0  Suicidal thoughts 0 0 0 0  PHQ-9 Score 0 7 7 0   GAD 7 : Generalized Anxiety Score 11/20/2015 10/20/2015 10/09/2015 09/18/2015  Nervous, Anxious, on Edge 2 0 0 0  Control/stop worrying 2 0 0 0  Worry too much - different things 2 0 0 0  Trouble relaxing 2 2 3  0  Restless 2 2 3  0  Easily annoyed or irritable 2 2 2  0  Afraid - awful might happen 2 2 3  0  Total GAD 7 Score 14 8 11  0  Anxiety Difficulty - - - Not difficult at all

## 2015-11-20 NOTE — Progress Notes (Signed)
   PRENATAL VISIT NOTE  Subjective:  Molly Savage is a 25 y.o. G1P0 at 3782w2d being seen today for ongoing prenatal care.  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  Patient reports vaginal bump & vaginal spotting on toilet paper when arriving to visit. Noticed the vaginal bump yesterday. States it's on her right labial & it is painful. Did not notice discharge from lesion until arriving here today.  Contractions: Not present. Vag. Bleeding: None.  Movement: Present. Denies leaking of fluid.   The following portions of the patient's history were reviewed and updated as appropriate: allergies, current medications, past family history, past medical history, past social history, past surgical history and problem list. Problem list updated.  Pt continues taking levimir 100 QHS & is taking Novolog 10-40 with meals (states normally ends up taking 35 units). Has appointment with diabetes educator this Thursday. States she ate noodles & 3 pieces of bread last night. Ate sugary cereal this morning.   Fasting PPB PPL PPD  44-173, 5/9 abnl 65-195, 2/7 abnl 60-271, 3/9 abnl 56-170, 5/10 abnl     Objective:   Vitals:   11/20/15 0852  BP: 125/79  Pulse: (!) 101  Weight: 272 lb (123.4 kg)    Fetal Status:     Movement: Present   Fundal height 28 cm  General:  Alert, oriented and cooperative. Patient is in no acute distress.  Skin: Skin is warm and dry. No rash noted.   Cardiovascular: Normal heart rate noted  Respiratory: Normal respiratory effort, no problems with respiration noted  Abdomen: Soft, gravid, appropriate for  gestational age. Pain/Pressure: Present     Pelvic:  Cervical exam deferred        Small ulcerative lesion on upper right labia; serosanguinous discharge noted when lesion touched -- HSV culture obtained.  No evidence of vaginal bleeding.   Extremities: Normal range of motion.  Edema: None  Mental Status: Normal mood and affect. Normal behavior. Normal judgment and thought content.   Assessment and Plan:  Pregnancy: G1P0 at 6282w2d  1. Supervision of high risk pregnancy, antepartum  - US MFM FETAL BPP WO NON STRESS; Future  2. Hypertension, unspecified type  - US MFM FETAL BPP WO NON STRESS; Future -pt refuses baby aspirin  3. Type 1 diabetes mellitus complicating pregnancy in third trimester, antepartum  - US MFM FETAL BPP WO NON STRESS; Future --diabetes education appt on Thursday -discussed importance of BS control during pregnancy  4. Labial lesion  - Herpes simplex virus culture   Preterm labor symptoms and general obstetric precautions including but not limited to vaginal bleeding, contractions, leaking of fluid and fetal movement were reviewed in detail with the patient. Please refer to After Visit Summary for other counseling recommendations.  Return in about 2 weeks (around 12/04/2015) for High Risk OB.  Judeth HornErin Nieves Chapa, NP

## 2015-11-20 NOTE — Progress Notes (Signed)
Patient reports bump/pimple near labia causing pain that she wants examined

## 2015-11-22 ENCOUNTER — Telehealth: Payer: Self-pay | Admitting: *Deleted

## 2015-11-22 ENCOUNTER — Ambulatory Visit (HOSPITAL_COMMUNITY)
Admission: RE | Admit: 2015-11-22 | Discharge: 2015-11-22 | Disposition: A | Discharge status: Home / Self Care | Payer: Medicaid Other | Source: Ambulatory Visit | Attending: Obstetrics & Gynecology | Admitting: Obstetrics & Gynecology

## 2015-11-22 ENCOUNTER — Encounter (HOSPITAL_COMMUNITY): Payer: Self-pay

## 2015-11-22 DIAGNOSIS — O24313 Unspecified pre-existing diabetes mellitus in pregnancy, third trimester: Secondary | ICD-10-CM | POA: Insufficient documentation

## 2015-11-22 DIAGNOSIS — O0990 Supervision of high risk pregnancy, unspecified, unspecified trimester: Secondary | ICD-10-CM

## 2015-11-22 DIAGNOSIS — O99333 Smoking (tobacco) complicating pregnancy, third trimester: Secondary | ICD-10-CM | POA: Diagnosis present

## 2015-11-22 DIAGNOSIS — O24013 Pre-existing diabetes mellitus, type 1, in pregnancy, third trimester: Secondary | ICD-10-CM

## 2015-11-22 DIAGNOSIS — O99213 Obesity complicating pregnancy, third trimester: Secondary | ICD-10-CM | POA: Insufficient documentation

## 2015-11-22 DIAGNOSIS — I10 Essential (primary) hypertension: Secondary | ICD-10-CM

## 2015-11-22 DIAGNOSIS — Z3A28 28 weeks gestation of pregnancy: Secondary | ICD-10-CM | POA: Diagnosis not present

## 2015-11-22 DIAGNOSIS — O24919 Unspecified diabetes mellitus in pregnancy, unspecified trimester: Secondary | ICD-10-CM

## 2015-11-22 LAB — HERPES SIMPLEX VIRUS CULTURE: Organism ID, Bacteria: NOT DETECTED

## 2015-11-22 MED ORDER — "INSULIN SYRINGE-NEEDLE U-100 27G X 1/2"" 1 ML MISC": 1.0000 | Freq: Four times a day (QID) | 5 refills | Status: DC

## 2015-11-22 NOTE — Telephone Encounter (Signed)
Pt left message stating that her pharmacy does not have the prescription for insulin syringes on file and she needs them.  I called the pharmacy and it was confirmed that they do not have the Rx. I re-sent the prescription and per EPIC it was received. I called pt and informed her.  She voiced understanding.

## 2015-11-23 ENCOUNTER — Ambulatory Visit (HOSPITAL_COMMUNITY): Payer: Medicaid Other

## 2015-11-29 ENCOUNTER — Telehealth: Payer: Self-pay

## 2015-11-29 NOTE — Telephone Encounter (Signed)
Per Judeth HornErin Lawrence, pt's HSV is negative.  LM for pt to return call.

## 2015-12-01 NOTE — Telephone Encounter (Signed)
Pt called stating that she was returning a call to [place].

## 2015-12-06 NOTE — Telephone Encounter (Signed)
Pt notified that her HSV test was negative. Pt voiced understanding and expressed gratitude.

## 2015-12-07 ENCOUNTER — Ambulatory Visit (HOSPITAL_COMMUNITY)
Admission: RE | Admit: 2015-12-07 | Discharge: 2015-12-07 | Disposition: A | Discharge status: Home / Self Care | Payer: Medicaid Other | Source: Ambulatory Visit | Attending: Obstetrics & Gynecology | Admitting: Obstetrics & Gynecology

## 2015-12-07 ENCOUNTER — Encounter: Payer: Medicaid Other | Attending: Obstetrics & Gynecology | Admitting: *Deleted

## 2015-12-07 DIAGNOSIS — Z3A00 Weeks of gestation of pregnancy not specified: Secondary | ICD-10-CM | POA: Insufficient documentation

## 2015-12-07 DIAGNOSIS — Z794 Long term (current) use of insulin: Secondary | ICD-10-CM | POA: Insufficient documentation

## 2015-12-07 DIAGNOSIS — O24013 Pre-existing diabetes mellitus, type 1, in pregnancy, third trimester: Principal | ICD-10-CM

## 2015-12-07 DIAGNOSIS — O24319 Unspecified pre-existing diabetes mellitus in pregnancy, unspecified trimester: Secondary | ICD-10-CM | POA: Insufficient documentation

## 2015-12-07 DIAGNOSIS — E1065 Type 1 diabetes mellitus with hyperglycemia: Secondary | ICD-10-CM

## 2015-12-07 NOTE — Progress Notes (Signed)
Appt start time: 1530 end time:  1630.  Assessment:  Patient was seen on  12/07/2015 for individual diabetes education. She has Type 1 Diabetes and is pregnant at [redacted]w[redacted]d  She states she is on Levemir insulin @ 100 units in the evening and adjusts her meal time Novolog insulin between 10-40 units depending on carbohydrate size of the meal and/or need for correction dose. She did not bring her BG Meter today, and does not record her BG's at this time. She states she understands carb counting typically but does not restrict her carb intake.   I met with this patient briefly on 10/19 but she had not allowed enough time for the appointment so we only talked for about 10 minutes. I do not see these notes in EPIC. During that brief visit I mentioned the possibility of an insulin pump during her pregnancy and she expressed interest in learning more about that. I then contacted the local [employer] Rep to evaluate her as a candidate. The patient states she did here from [employer], but not lately. I will follow up to see if this could be possible.   Patient Education Plan per assessed needs and concerns is to attend individual session for Diabetes Self Management Education.  Current HbA1c: 5.8% in August, 2017  MEDICATIONS: see list. Diabetes medications: Levemir and Novolog  DIETARY INTAKE:  Usual eating pattern includes 3 meals and 3 snacks per day.  Everyday foods include Ramen noodles, PNB sandwiches, hot dogs and pizza rolls.  Avoided foods include she does not cook.    24-hr recall:  B ( AM): ramen noodles with 3 slices PNB bread  Snk ( AM): not typically  L ( PM): 2 sandwiches with chips OR hoagie roll sandwich with chips Snk ( PM): chips if bored and hungry D ( PM): sandwiches OR hot dogs OR pizza rolls Snk ( PM): not usually Beverages: diet soda, tea with 1 cup sugar per gallon, infrequently water  Usual physical activity: no  Estimated energy intake: 2400 calories 270 -300 grams  carbohydrate per day @ 90-120 grams / meal 80-100 g protein 90 + g fat  Progress Towards Goal(s):  In progress.   Nutritional Diagnosis:  NI-1.5 Excessive energy intake As related to excess food intake compared to activity level.  As evidenced by obesity and BMI above 40    Intervention:  Nutrition counseling provided.  Provided education on macronutrients on glucose levels.  Provided education on carb counting by food group, importance of regularly scheduled meals/snacks, and meal planning   Reviewed patient medications.  Discussed role of medication on blood glucose and possible side effects. She is already adjusting her meal time insulin based on quantity of carbohydrate in meal, but not using any formula. I assessed her current insulin doses based typical carb content of most of her stated meals and suggested a Insulin to Carb Ratio (ICR) of 1 unit / 2.5 grams carb (= 6 units/carb choice)   Discussed blood glucose monitoring and interpretation.  Discussed recommended target ranges and individual ranges.  Provided her with a Log Sheet to record her BG, carb intake and insulin doses to track for a few days and evaluate accuracy of suggested ICR.   Teaching Method Utilized: Visual and Auditory  Handouts given during visit include:  Carb Counting Handout  BG Log Sheet  Barriers to learning/adherence to lifestyle change: limited finances and transportation  Demonstrated degree of understanding via:  Teach Back   Monitoring/Evaluation:  Dietary intake, meal time  insulin dosing  and body weight in 3 week(s).

## 2015-12-08 ENCOUNTER — Encounter: Payer: Self-pay | Admitting: Family Medicine

## 2015-12-11 ENCOUNTER — Ambulatory Visit (HOSPITAL_COMMUNITY)
Admission: RE | Admit: 2015-12-11 | Discharge: 2015-12-11 | Disposition: A | Discharge status: Home / Self Care | Payer: Medicaid Other | Source: Ambulatory Visit | Attending: Obstetrics & Gynecology | Admitting: Obstetrics & Gynecology

## 2015-12-11 ENCOUNTER — Other Ambulatory Visit (HOSPITAL_COMMUNITY): Payer: Self-pay | Admitting: Maternal and Fetal Medicine

## 2015-12-11 ENCOUNTER — Encounter (HOSPITAL_COMMUNITY): Payer: Self-pay

## 2015-12-11 ENCOUNTER — Other Ambulatory Visit (HOSPITAL_COMMUNITY): Payer: Self-pay | Admitting: *Deleted

## 2015-12-11 DIAGNOSIS — O99213 Obesity complicating pregnancy, third trimester: Secondary | ICD-10-CM | POA: Insufficient documentation

## 2015-12-11 DIAGNOSIS — O24919 Unspecified diabetes mellitus in pregnancy, unspecified trimester: Secondary | ICD-10-CM

## 2015-12-11 DIAGNOSIS — O99333 Smoking (tobacco) complicating pregnancy, third trimester: Secondary | ICD-10-CM | POA: Insufficient documentation

## 2015-12-11 DIAGNOSIS — O24313 Unspecified pre-existing diabetes mellitus in pregnancy, third trimester: Secondary | ICD-10-CM | POA: Diagnosis not present

## 2015-12-11 DIAGNOSIS — Z3A31 31 weeks gestation of pregnancy: Secondary | ICD-10-CM | POA: Diagnosis not present

## 2015-12-11 DIAGNOSIS — Z794 Long term (current) use of insulin: Secondary | ICD-10-CM

## 2015-12-11 DIAGNOSIS — O24319 Unspecified pre-existing diabetes mellitus in pregnancy, unspecified trimester: Secondary | ICD-10-CM

## 2015-12-11 NOTE — Addendum Note (Signed)
Encounter addended by: Vivien Rotaachael H Cacey Willow, RT on: 12/11/2015  9:37 AM<BR>    Actions taken: Imaging Exam ended

## 2015-12-21 ENCOUNTER — Encounter: Payer: Self-pay | Admitting: Obstetrics and Gynecology

## 2015-12-21 ENCOUNTER — Ambulatory Visit (INDEPENDENT_AMBULATORY_CARE_PROVIDER_SITE_OTHER): Payer: Medicaid Other | Admitting: Obstetrics and Gynecology

## 2015-12-21 VITALS — BP 127/75 | HR 91 | Wt 267.6 lb

## 2015-12-21 DIAGNOSIS — O24013 Pre-existing diabetes mellitus, type 1, in pregnancy, third trimester: Secondary | ICD-10-CM

## 2015-12-21 DIAGNOSIS — O99213 Obesity complicating pregnancy, third trimester: Secondary | ICD-10-CM

## 2015-12-21 DIAGNOSIS — O0990 Supervision of high risk pregnancy, unspecified, unspecified trimester: Secondary | ICD-10-CM

## 2015-12-21 DIAGNOSIS — I10 Essential (primary) hypertension: Secondary | ICD-10-CM

## 2015-12-21 DIAGNOSIS — Z6841 Body Mass Index (BMI) 40.0 and over, adult: Secondary | ICD-10-CM

## 2015-12-21 DIAGNOSIS — Z9119 Patient's noncompliance with other medical treatment and regimen: Secondary | ICD-10-CM

## 2015-12-21 DIAGNOSIS — O99210 Obesity complicating pregnancy, unspecified trimester: Secondary | ICD-10-CM

## 2015-12-21 DIAGNOSIS — Z720 Tobacco use: Secondary | ICD-10-CM

## 2015-12-21 DIAGNOSIS — O09893 Supervision of other high risk pregnancies, third trimester: Secondary | ICD-10-CM

## 2015-12-21 DIAGNOSIS — Z91199 Patient's noncompliance with other medical treatment and regimen due to unspecified reason: Secondary | ICD-10-CM

## 2015-12-21 DIAGNOSIS — E669 Obesity, unspecified: Secondary | ICD-10-CM

## 2015-12-21 LAB — POCT URINALYSIS DIP (DEVICE)
Bilirubin Urine: NEGATIVE
Glucose, UA: NEGATIVE mg/dL
Hgb urine dipstick: NEGATIVE
Ketones, ur: NEGATIVE mg/dL
Leukocytes, UA: NEGATIVE
Nitrite: NEGATIVE
Protein, ur: NEGATIVE mg/dL
Specific Gravity, Urine: 1.015 (ref 1.005–1.030)
Urobilinogen, UA: 1 mg/dL (ref 0.0–1.0)
pH: 7 (ref 5.0–8.0)

## 2015-12-21 NOTE — Progress Notes (Addendum)
Prenatal Visit Note Date: 12/21/2015 Clinic: [facility]  Subjective:  Molly Savage is a 25 y.o. G1P0 at 6072w5d being seen today for ongoing prenatal care.  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  Patient reports no complaints.   Contractions: Not present. Vag. Bleeding: None.  Movement: Present. Denies leaking of fluid.   The following portions of the patient's history were reviewed and updated as appropriate: allergies, current medications, past family history, past medical history, past social history, past surgical history and problem list. Problem list updated.  Objective:   Vitals:   12/21/15 0758  BP: 127/75  Pulse: 91  Weight: 267 lb 9.6 oz (121.4 kg)    Fetal Status: Fetal Heart Rate (bpm): 155   Movement: Present     General:  Alert, oriented and cooperative. Patient is in no acute distress.  Skin: Skin is warm and dry. No rash noted.   Cardiovascular: Normal heart rate noted  Respiratory: Normal respiratory effort, no problems with respiration noted  Abdomen: Soft, gravid, appropriate for gestational age. Pain/Pressure: Present     Pelvic:  Cervical exam deferred        Extremities: Normal range of motion.  Edema: None  Mental Status: Normal mood and affect. Normal behavior. Normal judgment and thought content.   Urinalysis: Urine Protein: Negative Urine Glucose: Negative  Assessment and Plan:  Pregnancy: G1P0 at 5872w5d  1. Supervision of high risk pregnancy, antepartum Routine care. Normal 28wk  labs last visit. D/w pt re: BC nv.   2. Obesity in pregnancy See below  3. Type 1 diabetes mellitus complicating pregnancy in third trimester, antepartum Patient on levemir 100u qhs and 34-40 units with meals with 10 units PRN afterwards based on what she's eating. Patient is eating sugar cereal in the room and has missed multiple visits with us and [facility]  Her last visit with them was on 8/1 and they wanted to see her back in two weeks. Pt didn't even remember seeing them then or for follow up visits. I told her that she needs to call them ASAP to be seen for follow up. I told her to stay on her regimen: AM fastings appear to be <95 and AC BS checks are in the 120s-140s. I asked her to try and do 1-2hr PP but she says that's how often she eats so the 120-140 AC BS values are similar to 1-2h post prandials.  She has been seen here by DM educators and is in the midst of possibly obtaining and insulin pump. I told her that endocrine really needs to take the lead in re: to her insulin regimen especially if a pump is being considered, which I suspect they wouldn't want to do given her poor follow up.  Will check a1 today. D/w pt since she's 32wks she needs to be coming in 2x/week for AP testing and stressed importance of her to come for these visits and BS control with risk of pre-eclampsia, LGA, SGA, IUFD, etc d/w her.  -11/20: normal efw/ac/afi/cephalic and BPP 8/8. -s/p normal fetal echo - Hemoglobin A1C  5. Chronic Hypertension, unspecified type  No meds, pt declined baby ASA. No current issues. See above  6. Non-compliant pregnant patient, third trimester See above  7. Tobacco abuse See above  Preterm labor symptoms and general obstetric precautions including but not limited to vaginal bleeding, contractions, leaking of fluid and fetal movement were reviewed in detail with the patient. Please refer to After Visit Summary for other counseling  recommendations.  Return for start 2x/week testing this week. ROB 7-10d.    Chapel Bingharlie Margarette Vannatter, MD

## 2015-12-22 LAB — HEMOGLOBIN A1C
Hgb A1c MFr Bld: 6.1 % — ABNORMAL HIGH (ref <5.7)
Mean Plasma Glucose: 128 mg/dL

## 2015-12-24 ENCOUNTER — Other Ambulatory Visit: Payer: Self-pay | Admitting: Obstetrics & Gynecology

## 2015-12-24 DIAGNOSIS — O24013 Pre-existing diabetes mellitus, type 1, in pregnancy, third trimester: Secondary | ICD-10-CM

## 2015-12-24 NOTE — Progress Notes (Unsigned)
Pt needs c peptide and fasting glucose drawn at the same time.  Medicaid requires this info for pump.

## 2015-12-26 ENCOUNTER — Ambulatory Visit (INDEPENDENT_AMBULATORY_CARE_PROVIDER_SITE_OTHER): Payer: Medicaid Other | Admitting: Student

## 2015-12-26 VITALS — BP 128/72 | HR 104

## 2015-12-26 DIAGNOSIS — E739 Lactose intolerance, unspecified: Secondary | ICD-10-CM

## 2015-12-26 DIAGNOSIS — O24013 Pre-existing diabetes mellitus, type 1, in pregnancy, third trimester: Secondary | ICD-10-CM

## 2015-12-26 NOTE — Progress Notes (Signed)
   PRENATAL VISIT NOTE  Subjective:  Molly Savage is a 25 y.o. G1P0 at 4454w3d being seen today for ongoing prenatal care.  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  Objective:   Vitals:   12/26/15 0930  BP: 128/72  Pulse: (!) 104    Fetal Status:     Movement: Present     Assessment and Plan:  Pregnancy: G1P0 at 1254w3d  Patient is here for NST. FHR is 150 with moderate variability and present accelerations, no decelerations.   Marylene LandKathryn Lorraine Kooistra, CNM

## 2015-12-29 ENCOUNTER — Ambulatory Visit (INDEPENDENT_AMBULATORY_CARE_PROVIDER_SITE_OTHER): Payer: Medicaid Other | Admitting: Obstetrics & Gynecology

## 2015-12-29 ENCOUNTER — Encounter: Payer: Self-pay | Admitting: Obstetrics & Gynecology

## 2015-12-29 VITALS — BP 136/88 | HR 100 | Wt 270.1 lb

## 2015-12-29 DIAGNOSIS — Z794 Long term (current) use of insulin: Secondary | ICD-10-CM | POA: Diagnosis not present

## 2015-12-29 DIAGNOSIS — O24313 Unspecified pre-existing diabetes mellitus in pregnancy, third trimester: Secondary | ICD-10-CM | POA: Diagnosis not present

## 2015-12-29 DIAGNOSIS — IMO0001 Reserved for inherently not codable concepts without codable children: Secondary | ICD-10-CM

## 2015-12-29 DIAGNOSIS — K219 Gastro-esophageal reflux disease without esophagitis: Secondary | ICD-10-CM

## 2015-12-29 DIAGNOSIS — O10919 Unspecified pre-existing hypertension complicating pregnancy, unspecified trimester: Secondary | ICD-10-CM

## 2015-12-29 DIAGNOSIS — O0990 Supervision of high risk pregnancy, unspecified, unspecified trimester: Secondary | ICD-10-CM

## 2015-12-29 LAB — POCT URINALYSIS DIP (DEVICE)
Bilirubin Urine: NEGATIVE
Glucose, UA: NEGATIVE mg/dL
Hgb urine dipstick: NEGATIVE
Ketones, ur: 40 mg/dL — AB
Leukocytes, UA: NEGATIVE
Nitrite: NEGATIVE
Protein, ur: NEGATIVE mg/dL
Specific Gravity, Urine: 1.02 (ref 1.005–1.030)
Urobilinogen, UA: 1 mg/dL (ref 0.0–1.0)
pH: 7 (ref 5.0–8.0)

## 2015-12-29 MED ORDER — OMEPRAZOLE 20 MG PO CPDR: 20.0000 mg | DELAYED_RELEASE_CAPSULE | Freq: Two times a day (BID) | ORAL | 6 refills | Status: DC

## 2015-12-29 MED ORDER — INSULIN DETEMIR 100 UNIT/ML ~~LOC~~ SOLN: 100.0000 [IU] | Freq: Every day | SUBCUTANEOUS | 12 refills | Status: DC

## 2015-12-29 NOTE — Addendum Note (Signed)
Addended by: Gerome ApleyZEYFANG, Sueanne Maniaci L on: 12/29/2015 10:12 AM   Modules accepted: Orders

## 2015-12-29 NOTE — Progress Notes (Signed)
   PRENATAL VISIT NOTE  Subjective:  Molly Savage is a 25 y.o. G1P0 at 617w6d being seen today for ongoing prenatal care.  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  Patient reports no complaints.  Contractions: Not present. Vag. Bleeding: None.  Movement: Present. Denies leaking of fluid.   The following portions of the patient's history were reviewed and updated as appropriate: allergies, current medications, past family history, past medical history, past social history, past surgical history and problem list. Problem list updated.  Objective:   Vitals:   12/29/15 0927  BP: 136/88  Pulse: 100  Weight: 270 lb 1.6 oz (122.5 kg)    Fetal Status: Fetal Heart Rate (bpm): NST   Movement: Present     General:  Alert, oriented and cooperative. Patient is in no acute distress.  Skin: Skin is warm and dry. No rash noted.   Cardiovascular: Normal heart rate noted  Respiratory: Normal respiratory effort, no problems with respiration noted  Abdomen: Soft, gravid, appropriate for gestational age. Pain/Pressure: Absent     Pelvic:  Cervical exam performed        Extremities: Normal range of motion.  Edema: Trace  Mental Status: Normal mood and affect. Normal behavior. Normal judgment and thought content.   NST performed today was reviewed and was found to be reactive.  Continue recommended antenatal testing and prenatal care.  Assessment and Plan:  Pregnancy: G1P0 at  677w6d  1. Pre-existing insulin-dependent diabetes mellitus during pregnancy, third trimester (HCC) Fastings 64-149, PP 120-160s, one was 233! Still not ideal. However, HgA1C was 6.1 recently.  She is in the process of getting insulin pump. Will continue to watch closely.  - insulin detemir (LEVEMIR) 100 UNIT/ML injection; Inject 1 mL (100 Units total) into the skin at bedtime. Take 100 units of insulin at bedtime  Dispense: 3 vial; Refill: 12 - Glucose, Fasting - C-peptide Next growth scan is 01/08/16.   2. Chronic hypertension complicating pregnancy, antepartum Stable BP for now.   3. Gastroesophageal reflux disease without esophagitis Prilosec refilled - omeprazole (PRILOSEC) 20 MG capsule; Take 1 capsule (20 mg total) by mouth 2 (two) times daily before a meal.  Dispense: 60 capsule; Refill: 6  4. Supervision of high risk pregnancy, antepartum Preterm labor symptoms and general obstetric precautions including but not limited to vaginal bleeding, contractions, leaking of fluid and fetal movement were reviewed in detail with the patient. Please refer to After Visit Summary for other counseling recommendations.  Return in about 1 week (around 01/05/2016) for OB Visit, NST.  Add on AFI on 01/02/16.Marland Kitchen.   Molly NewcomerUgonna A Derika Eckles, MD

## 2015-12-29 NOTE — Patient Instructions (Signed)
Return to clinic for any scheduled appointments or obstetric concerns, or go to [facility] for evaluation  

## 2015-12-30 LAB — C-PEPTIDE: C-Peptide: 0.47 ng/mL — ABNORMAL LOW (ref 0.80–3.85)

## 2015-12-30 LAB — GLUCOSE, FASTING: Glucose, Fasting: 117 mg/dL — ABNORMAL HIGH (ref 65–99)

## 2016-01-01 ENCOUNTER — Ambulatory Visit (INDEPENDENT_AMBULATORY_CARE_PROVIDER_SITE_OTHER): Payer: Medicaid Other | Admitting: *Deleted

## 2016-01-01 ENCOUNTER — Ambulatory Visit: Payer: Self-pay

## 2016-01-01 VITALS — BP 136/82 | HR 110

## 2016-01-01 DIAGNOSIS — Z794 Long term (current) use of insulin: Secondary | ICD-10-CM

## 2016-01-01 DIAGNOSIS — O10919 Unspecified pre-existing hypertension complicating pregnancy, unspecified trimester: Secondary | ICD-10-CM

## 2016-01-01 DIAGNOSIS — Z3689 Encounter for other specified antenatal screening: Secondary | ICD-10-CM

## 2016-01-01 DIAGNOSIS — IMO0001 Reserved for inherently not codable concepts without codable children: Secondary | ICD-10-CM

## 2016-01-01 DIAGNOSIS — O24313 Unspecified pre-existing diabetes mellitus in pregnancy, third trimester: Secondary | ICD-10-CM

## 2016-01-02 ENCOUNTER — Other Ambulatory Visit: Payer: Self-pay

## 2016-01-02 NOTE — Progress Notes (Signed)
Pt informed that the ultrasound is considered a limited OB ultrasound and is not intended to be a complete ultrasound exam.  Patient also informed that the ultrasound is not being completed with the intent of assessing for fetal or placental anomalies or any pelvic abnormalities.  Explained that the purpose of today's ultrasound is to assess for fetal position and amniotic fluid volume.  Patient acknowledges the purpose of the exam and the limitations of the study.

## 2016-01-04 ENCOUNTER — Ambulatory Visit (INDEPENDENT_AMBULATORY_CARE_PROVIDER_SITE_OTHER): Payer: Medicaid Other | Admitting: Student

## 2016-01-04 ENCOUNTER — Telehealth (HOSPITAL_COMMUNITY): Payer: Self-pay | Admitting: *Deleted

## 2016-01-04 VITALS — BP 124/84 | HR 97 | Wt 273.7 lb

## 2016-01-04 DIAGNOSIS — Z794 Long term (current) use of insulin: Secondary | ICD-10-CM

## 2016-01-04 DIAGNOSIS — L089 Local infection of the skin and subcutaneous tissue, unspecified: Secondary | ICD-10-CM

## 2016-01-04 DIAGNOSIS — O24313 Unspecified pre-existing diabetes mellitus in pregnancy, third trimester: Secondary | ICD-10-CM | POA: Diagnosis present

## 2016-01-04 DIAGNOSIS — O0990 Supervision of high risk pregnancy, unspecified, unspecified trimester: Secondary | ICD-10-CM

## 2016-01-04 DIAGNOSIS — IMO0001 Reserved for inherently not codable concepts without codable children: Secondary | ICD-10-CM

## 2016-01-04 MED ORDER — CEPHALEXIN 500 MG PO CAPS: 500.0000 mg | ORAL_CAPSULE | Freq: Four times a day (QID) | ORAL | 0 refills | Status: DC

## 2016-01-04 NOTE — Progress Notes (Signed)
Pt state she has infection of piercing on outer Rt ear.  She also reports pain in Lt ear.  US for growth and BPP scheduled on 12/18.

## 2016-01-04 NOTE — Progress Notes (Signed)
    PRENATAL VISIT NOTE  Subjective:  Molly Savage is a 25 y.o. G1P0 at 3921w5d being seen today for ongoing prenatal care.  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  Patient reports ear pain. States she pierce the cartilige of her ears 2 weeks ago. Has had pain of her right ear since that time. ear has been red & swollen. has cleaned piercing daily without relief. Denies fever or purulent drainage. .  Contractions: Not present. Vag. Bleeding: None.  Movement: (!) Decreased. Denies leaking of fluid.   The following portions of the patient's history were reviewed and updated as appropriate: allergies, current medications, past family history, past medical history, past social history, past surgical history and problem list. Problem list updated.  Objective:   Vitals:   01/04/16 1518  BP: 124/84  Pulse: 97  Weight: 273 lb 11.2 oz (124.1 kg)    Fetal Status: Fetal Heart Rate (bpm): NST   Movement: (!) Decreased     General:  Alert, oriented and cooperative. Patient is in no acute distress.  Skin: Skin is warm and dry. No rash noted.   Cardiovascular: Normal heart rate noted  Respiratory: Normal respiratory effort, no problems with respiration noted  Abdomen: Soft, gravid, appropriate for gestational age. Pain/Pressure: Present     Pelvic:  Cervical exam deferred        Extremities: Normal range of motion.  Edema: Trace  Mental Status: Normal mood and affect. Normal  behavior. Normal judgment and thought content.   Assessment and Plan:  Pregnancy: G1P0 at 4321w5d  1. Pre-existing insulin-dependent diabetes mellitus during pregnancy, third trimester (HCC) Fasting PPB PPL PPD  88-160, 4/6 abnl 93-230, 4/6 abnl 57-117, 0/6 abnl 55-85, 0/6 abnl   - [facility] - Reactive Fetal nonstress test  2. Supervision of high risk pregnancy, antepartum   3. Skin infection  - cephALEXin (KEFLEX) 500 MG capsule; Take 1 capsule (500 mg total) by mouth 4 (four) times daily.  Dispense: 28 capsule; Refill: 0  Preterm labor symptoms and general obstetric precautions including but not limited to vaginal bleeding, contractions, leaking of fluid and fetal movement were reviewed in detail with the patient. Please refer to After Visit Summary for other counseling recommendations.  Return in about 12 days (around 01/16/2016) for NST/AFI.   Judeth HornErin Nivek Powley, NP

## 2016-01-04 NOTE — Patient Instructions (Signed)
Hypertension During Pregnancy °Hypertension, commonly called high blood pressure, is when the force of blood pumping through your arteries is too strong. Arteries are blood vessels that carry blood from the heart throughout the body. Hypertension during pregnancy can cause problems for you and your baby. Your baby may be born early (prematurely) or may not weigh as much as he or she should at birth. Very bad cases of hypertension during pregnancy can be life-threatening. °Different types of hypertension can occur during pregnancy. These include: °· Chronic hypertension. This happens when: °¨ You have hypertension before pregnancy and it continues during pregnancy. °¨ You develop hypertension before you are [redacted] weeks pregnant, and it continues during pregnancy. °· Gestational hypertension. This is hypertension that develops after the 20th week of pregnancy. °· Preeclampsia, also called toxemia of pregnancy. This is a very serious type of hypertension that develops only during pregnancy. It affects the whole body, and it can be very dangerous for you and your baby. °Gestational hypertension and preeclampsia usually go away within 6 weeks after your baby is born. Women who have hypertension during pregnancy have a greater chance of developing hypertension later in life or during future pregnancies. °What are the causes? °The exact cause of hypertension is not known. °What increases the risk? °There are certain factors that make it more likely for you to develop hypertension during pregnancy. These include: °· Having hypertension during a previous pregnancy or prior to pregnancy. °· Being overweight. °· Being older than age 40. °· Being pregnant for the first time or being pregnant with more than one baby. °· Becoming pregnant using fertilization methods such as IVF (in vitro fertilization). °· Having diabetes, kidney problems, or systemic lupus erythematosus. °· Having a family history of hypertension. °What are the  signs or symptoms? °Chronic hypertension and gestational hypertension rarely cause symptoms. Preeclampsia causes symptoms, which may include: °· Increased protein in your urine. Your health care provider will check for this at every visit before you give birth (prenatal visit). °· Severe headaches. °· Sudden weight gain. °· Swelling of the hands, face, legs, and feet. °· Nausea and vomiting. °· Vision problems, such as blurred or double vision. °· Numbness in the face, arms, legs, and feet. °· Dizziness. °· Slurred speech. °· Sensitivity to bright lights. °· Abdominal pain. °· Convulsions. °How is this diagnosed? °You may be diagnosed with hypertension during a routine prenatal exam. At each prenatal visit, you may: °· Have a urine test to check for high amounts of protein in your urine. °· Have your blood pressure checked. A blood pressure reading is recorded as two numbers, such as "120 over 80" (or 120/80). The first ("top") number is called the systolic pressure. It is a measure of the pressure in your arteries when your heart beats. The second ("bottom") number is called the diastolic pressure. It is a measure of the pressure in your arteries as your heart relaxes between beats. Blood pressure is measured in a unit called mm Hg. A normal blood pressure reading is: °¨ Systolic: below 120. °¨ Diastolic: below 80. °The type of hypertension that you are diagnosed with depends on your test results and when your symptoms developed. °· Chronic hypertension is usually diagnosed before 20 weeks of pregnancy. °· Gestational hypertension is usually diagnosed after 20 weeks of pregnancy. °· Hypertension with high amounts of protein in the urine is diagnosed as preeclampsia. °· Blood pressure measurements that stay above 160 systolic, or above 110 diastolic, are signs of severe preeclampsia. °  How is this treated? °Treatment for hypertension during pregnancy varies depending on the type of hypertension you have and how  serious it is. °· If you take medicines called ACE inhibitors to treat chronic hypertension, you may need to switch medicines. ACE inhibitors should not be taken during pregnancy. °· If you have gestational hypertension, you may need to take blood pressure medicine. °· If you are at risk for preeclampsia, your health care provider may recommend that you take a low-dose aspirin every day to prevent high blood pressure during your pregnancy. °· If you have severe preeclampsia, you may need to be hospitalized so you and your baby can be monitored closely. You may also need to take medicine (magnesium sulfate) to prevent seizures and to lower blood pressure. This medicine may be given as an injection or through an IV tube. °· In some cases, if your condition gets worse, you may need to deliver your baby early. °Follow these instructions at home: °Eating and drinking  °· Drink enough fluid to keep your urine clear or pale yellow. °· Eat a healthy diet that is low in salt (sodium). Do not add salt to your food. Check food labels to see how much sodium a food or beverage contains. °Lifestyle  °· Do not use any products that contain nicotine or tobacco, such as cigarettes and e-cigarettes. If you need help quitting, ask your health care provider. °· Do not use alcohol. °· Avoid caffeine. °· Avoid stress as much as possible. Rest and get plenty of sleep. °General instructions  °· Take over-the-counter and prescription medicines only as told by your health care provider. °· While lying down, lie on your left side. This keeps pressure off your baby. °· While sitting or lying down, raise (elevate) your feet. Try putting some pillows under your lower legs. °· Exercise regularly. Ask your health care provider what kinds of exercise are best for you. °· Keep all prenatal and follow-up visits as told by your health care provider. This is important. °Contact a health care provider if: °· You have symptoms that your health care  provider told you may require more treatment or monitoring, such as: °¨ Fever. °¨ Vomiting. °¨ Headache. °Get help right away if: °· You have severe abdominal pain or vomiting that does not get better with treatment. °· You suddenly develop swelling in your hands, ankles, or face. °· You gain 4 lbs (1.8 kg) or more in 1 week. °· You develop vaginal bleeding, or you have blood in your urine. °· You do not feel your baby moving as much as usual. °· You have blurred or double vision. °· You have muscle twitching or sudden tightening (spasms). °· You have shortness of breath. °· Your lips or fingernails turn blue. °This information is not intended to replace advice given to you by your health care provider. Make sure you discuss any questions you have with your health care provider. °Document Released: 09/25/2010 Document Revised: 07/28/2015 Document Reviewed: 06/23/2015 °[employer] Interactive Patient Education © 2017 [employer] ° ° ° ° ° °Type 1 or Type 2 Diabetes Mellitus During Pregnancy, Self Care °Caring for yourself during your pregnancy when you have type 1 diabetes (type 1 diabetes mellitus) or type 2 diabetes (type 2 diabetes mellitus) means keeping your blood sugar (glucose) under control with a balance of: °· Nutrition. °· Exercise. °· Lifestyle changes. °· Insulin or medicines, if necessary. °· Support from your team of health care providers and others. °The following information explains   what you need to know to manage your diabetes at home during your pregnancy. °What do I need to do to manage my blood glucose? °· Check your blood glucose every day, as often as told by your health care provider. °· Contact your health care provider if your blood glucose is above your target for 2 tests in a row. °· Have your A1c (hemoglobin A1c) level checked at least two times a year, or as often as told by your health care provider. °Your health care provider will set individualized treatment goals for you. Generally,  the goal of treatment is to maintain the following blood glucose levels during pregnancy: °· After not eating (after fasting) for 8 hours: at or below 95 mg/dL (5.3 mmol/L). °· After meals (postprandial): °¨ One hour after a meal: at or below 140 mg/dL (7.8 mmol/L). °¨ Two hours after a meal: at or below 120 mg/dL (6.7 mmol/L). °· A1c level: 6-6.5% °What do I need to know about hyperglycemia and hypoglycemia? °What is hyperglycemia? Hyperglycemia, also called high blood glucose, occurs when blood glucose is too high. Make sure you know the early signs of hyperglycemia, such as: °· Increased thirst. °· Hunger. °· Feeling very tired. °· Needing to urinate more often than usual. °· Blurry vision. °What is hypoglycemia?  °Hypoglycemia, also called low blood glucose, occurs with a blood glucose level at or below 70 mg/dL (3.9 mmol/L). The risk for hypoglycemia increases during or after exercise, during sleep, during illness, and when skipping meals or fasting. °It is important to know the symptoms of hypoglycemia and treat it right away. Always have a 15-gram rapid-acting carbohydrate snack with you to treat low blood glucose. Family members and close friends should also know the symptoms and should understand how to treat hypoglycemia, in case you are not able to treat yourself. °What are the symptoms of hypoglycemia?  °Hypoglycemia symptoms can include: °· Hunger. °· Anxiety. °· Sweating and feeling clammy. °· Confusion. °· Dizziness or feeling light-headed. °· Sleepiness. °· Nausea. °· Increased heart rate. °· Headache. °· Blurry vision. °· Seizure. °· Nightmares. °· Tingling or numbness around the mouth, lips, or tongue. °· A change in speech. °· Decreased ability to concentrate. °· A change in coordination. °· Restless sleep. °· Tremors or shakes. °· Fainting. °· Irritability. °How do I treat hypoglycemia?  ° °If you are alert and able to swallow safely, follow the 15:15 rule: °· Take 15 grams of a rapid-acting  carbohydrate. Rapid-acting options include: °¨ 1 tube of glucose gel. °¨ 3 glucose pills. °¨ 6-8 pieces of hard candy. °¨ 4 oz (120 mL) of fruit juice . °¨ 4 oz (120 mL) of regular (not diet) soda. °· Check your blood glucose 15 minutes after you take the carbohydrate. °· If the repeat blood glucose level is still at or below 70 mg/dL (3.9 mmol/L), take 15 grams of a carbohydrate again. °· If your blood glucose level does not increase above 70 mg/dL (3.9 mmol/L) after 3 tries, seek emergency medical care. °· After your blood glucose level returns to normal, eat a meal or a snack within 1 hour. °How do I treat severe hypoglycemia?  °Severe hypoglycemia is when your blood glucose level is at or below 54 mg/dL (3 mmol/L). Severe hypoglycemia is an emergency. Do not wait to see if the symptoms will go away. Get medical help right away. Call your local emergency services (911 in the [place] Do not drive yourself to the hospital. °If you have severe   hypoglycemia and you cannot eat or drink, you may need an injection of glucagon. A family member or close friend should learn how to check your blood glucose and how to give you a glucagon injection. Ask your health care provider if you need to have an emergency glucagon injection kit available. °Severe hypoglycemia may need to be treated in a hospital. The treatment may include getting glucose through an IV tube. You may also need treatment for the cause of your hypoglycemia. °Can having diabetes put me at risk for other conditions? °Having diabetes can put you at risk for other long-term (chronic) conditions, such as heart disease and kidney disease. Your health care provider may prescribe medicines to help prevent complications from diabetes. These medicines may include: °· Aspirin. °· Medicine to lower cholesterol. °· Medicine to control blood pressure. °What else can I do to manage my diabetes? °Take your diabetes medicines as told  °· If your health care provider  prescribed insulin or diabetes medicines, take them every day. °· Do not run out of insulin or other diabetes medicines that you take. Plan ahead so you always have these available. °· If you use insulin, adjust your dosage based on how physically active you are and what foods you eat. Your health care provider will tell you how to adjust your dosage. °Your health care provider may recommend that you take one low-dose aspirin (81 mg) each day to help prevent high blood pressure during pregnancy (preeclampsia or eclampsia). You may be at risk for preeclampsia or eclampsia if: °· You had any of the following during a previous pregnancy: °¨ Preeclampsia or eclampsia. °¨ A fetal growth rate that was slower than normal. °¨ An early (preterm) birth. °¨ Separation of the placenta from the uterus (placental abruption). °¨ Fetal loss. °· You are pregnant with more than one baby. °· You have other medical conditions, such as high blood pressure or an autoimmune disease. °Make healthy food choices  ° °The things that you eat and drink affect your blood glucose and your insulin dosage. Making good choices helps to control your diabetes and prevent other health problems. A healthy meal plan includes eating lean proteins, complex carbohydrates, fresh fruits and vegetables, low-fat dairy products, and healthy fats. °Make an appointment to see a diet and nutrition specialist (registered dietitian) to help you create an eating plan that is right for you. Make sure that you: °· Follow instructions from your health care provider about eating or drinking restrictions. °· Drink enough fluid to keep your urine clear or pale yellow. °· Eat healthy snacks between nutritious meals. °· Track the carbohydrates that you eat. Do this by reading food labels and learning the standard serving sizes of foods. °· Follow your sick day plan whenever you cannot eat or drink as usual. Make this plan in advance with your health care provider. °Stay  active  ° °· Do at least 30 minutes of physical activity a day, or as much physical activity as your health care provider recommends during your pregnancy. °· If you start a new exercise or activity, work with your health care provider to adjust your insulin, medicines, or food intake as needed. °Make healthy lifestyle choices  °· Do not use any tobacco products, such as cigarettes, chewing tobacco, and e-cigarettes. If you need help quitting, ask your health care provider. °· Do not use alcohol. °· Learn to manage stress. If you need help with this, ask your health care provider. °Care for your body  °·   Keep your immunizations up to date. °· Schedule an eye exam during your first trimester of your pregnancy, or as told by your health care provider. °· Check your skin and feet every day for cuts, bruises, redness, blisters, or sores. Schedule a foot exam with your health care provider once every year. °· Brush your teeth and gums two times a day, and floss at least one time a day. Visit your dentist at least once every 6 months. °· Maintain a healthy weight during your pregnancy. °General instructions  ° °· Take over-the-counter and prescription medicines only as told by your health care provider. °· Talk with your health care provider about your risk for high blood pressure during pregnancy (preeclampsia or eclampsia). °· Share your diabetes management plan with people in your workplace, school, and household. °· Check your urine ketones when you are ill and as told by your health care provider. °· Carry a medical alert card or wear medical alert jewelry. °· Ask your health care provider: °¨ Do I need to meet with a diabetes educator? °¨ Where can I find a support group for people with diabetes? °· Keep all follow-up visits during your pregnancy (prenatal) and after delivery (postnatal) as told by your health care provider. This is important. °Where to find more information: °For more information about diabetes,  visit: °· American Diabetes Association (ADA): www.diabetes.org °· [employer] Association of Diabetes Educators (AADE): https://www.diabeteseducator.org/patient-resources °This information is not intended to replace advice given to you by your health care provider. Make sure you discuss any questions you have with your health care provider. °Document Released: 05/01/2015 Document Revised: 06/15/2015 Document Reviewed: 02/10/2015 °[employer] Interactive Patient Education © 2017 [employer] ° °

## 2016-01-08 ENCOUNTER — Other Ambulatory Visit (HOSPITAL_COMMUNITY): Payer: Self-pay | Admitting: Maternal and Fetal Medicine

## 2016-01-08 ENCOUNTER — Other Ambulatory Visit (HOSPITAL_COMMUNITY): Payer: Self-pay | Admitting: Obstetrics and Gynecology

## 2016-01-08 ENCOUNTER — Ambulatory Visit (HOSPITAL_COMMUNITY)
Admission: RE | Admit: 2016-01-08 | Discharge: 2016-01-08 | Disposition: A | Discharge status: Home / Self Care | Payer: Medicaid Other | Source: Ambulatory Visit | Attending: Obstetrics & Gynecology | Admitting: Obstetrics & Gynecology

## 2016-01-08 ENCOUNTER — Encounter (HOSPITAL_COMMUNITY): Payer: Self-pay

## 2016-01-08 DIAGNOSIS — O99213 Obesity complicating pregnancy, third trimester: Secondary | ICD-10-CM

## 2016-01-08 DIAGNOSIS — O99333 Smoking (tobacco) complicating pregnancy, third trimester: Secondary | ICD-10-CM

## 2016-01-08 DIAGNOSIS — Z794 Long term (current) use of insulin: Secondary | ICD-10-CM

## 2016-01-08 DIAGNOSIS — Z3A35 35 weeks gestation of pregnancy: Secondary | ICD-10-CM

## 2016-01-08 DIAGNOSIS — O24919 Unspecified diabetes mellitus in pregnancy, unspecified trimester: Secondary | ICD-10-CM

## 2016-01-08 DIAGNOSIS — O0990 Supervision of high risk pregnancy, unspecified, unspecified trimester: Secondary | ICD-10-CM

## 2016-01-08 DIAGNOSIS — O24313 Unspecified pre-existing diabetes mellitus in pregnancy, third trimester: Secondary | ICD-10-CM | POA: Diagnosis not present

## 2016-01-08 DIAGNOSIS — O24913 Unspecified diabetes mellitus in pregnancy, third trimester: Secondary | ICD-10-CM

## 2016-01-08 DIAGNOSIS — O24319 Unspecified pre-existing diabetes mellitus in pregnancy, unspecified trimester: Secondary | ICD-10-CM

## 2016-01-08 DIAGNOSIS — O24013 Pre-existing diabetes mellitus, type 1, in pregnancy, third trimester: Secondary | ICD-10-CM

## 2016-01-09 ENCOUNTER — Other Ambulatory Visit (HOSPITAL_COMMUNITY): Payer: Self-pay | Admitting: *Deleted

## 2016-01-09 DIAGNOSIS — O24919 Unspecified diabetes mellitus in pregnancy, unspecified trimester: Secondary | ICD-10-CM

## 2016-01-10 ENCOUNTER — Other Ambulatory Visit (HOSPITAL_COMMUNITY): Payer: Self-pay | Admitting: *Deleted

## 2016-01-10 DIAGNOSIS — O24919 Unspecified diabetes mellitus in pregnancy, unspecified trimester: Secondary | ICD-10-CM

## 2016-01-10 DIAGNOSIS — Z794 Long term (current) use of insulin: Secondary | ICD-10-CM

## 2016-01-11 ENCOUNTER — Ambulatory Visit (INDEPENDENT_AMBULATORY_CARE_PROVIDER_SITE_OTHER): Payer: Medicaid Other | Admitting: Obstetrics & Gynecology

## 2016-01-11 VITALS — BP 125/76 | HR 108 | Wt 274.2 lb

## 2016-01-11 DIAGNOSIS — O24313 Unspecified pre-existing diabetes mellitus in pregnancy, third trimester: Secondary | ICD-10-CM | POA: Diagnosis not present

## 2016-01-11 DIAGNOSIS — Z794 Long term (current) use of insulin: Secondary | ICD-10-CM | POA: Diagnosis not present

## 2016-01-11 DIAGNOSIS — IMO0001 Reserved for inherently not codable concepts without codable children: Secondary | ICD-10-CM

## 2016-01-11 DIAGNOSIS — O0990 Supervision of high risk pregnancy, unspecified, unspecified trimester: Secondary | ICD-10-CM

## 2016-01-11 LAB — POCT URINALYSIS DIP (DEVICE)
Glucose, UA: 100 mg/dL — AB
Ketones, ur: 40 mg/dL — AB
Nitrite: NEGATIVE
Protein, ur: NEGATIVE mg/dL
Specific Gravity, Urine: 1.025 (ref 1.005–1.030)
Urobilinogen, UA: 1 mg/dL (ref 0.0–1.0)
pH: 6 (ref 5.0–8.0)

## 2016-01-11 MED ORDER — INSULIN DETEMIR 100 UNIT/ML ~~LOC~~ SOLN: 110.0000 [IU] | Freq: Every day | SUBCUTANEOUS | 12 refills | Status: DC

## 2016-01-11 NOTE — Patient Instructions (Signed)
Third Trimester of Pregnancy The third trimester is from week 29 through week 40 (months 7 through 9). The third trimester is a time when the unborn baby (fetus) is growing rapidly. At the end of the ninth month, the fetus is about 20 inches in length and weighs 6-10 pounds. Body changes during your third trimester Your body goes through many changes during pregnancy. The changes vary from woman to woman. During the third trimester:  Your weight will continue to increase. You can expect to gain 25-35 pounds (11-16 kg) by the end of the pregnancy.  You may begin to get stretch marks on your hips, abdomen, and breasts.  You may urinate more often because the fetus is moving lower into your pelvis and pressing on your bladder.  You may develop or continue to have heartburn. This is caused by increased hormones that slow down muscles in the digestive tract.  You may develop or continue to have constipation because increased hormones slow digestion and cause the muscles that push waste through your intestines to relax.  You may develop hemorrhoids. These are swollen veins (varicose veins) in the rectum that can itch or be painful.  You may develop swollen, bulging veins (varicose veins) in your legs.  You may have increased body aches in the pelvis, back, or thighs. This is due to weight gain and increased hormones that are relaxing your joints.  You may have changes in your hair. These can include thickening of your hair, rapid growth, and changes in texture. Some women also have hair loss during or after pregnancy, or hair that feels dry or thin. Your hair will most likely return to normal after your baby is born.  Your breasts will continue to grow and they will continue to become tender. A yellow fluid (colostrum) may leak from your breasts. This is the first milk you are producing for your baby.  Your belly button may stick out.  You may notice more swelling in your hands, face, or  ankles.  You may have increased tingling or numbness in your hands, arms, and legs. The skin on your belly may also feel numb.  You may feel short of breath because of your expanding uterus.  You may have more problems sleeping. This can be caused by the size of your belly, increased need to urinate, and an increase in your body's metabolism.  You may notice the fetus "dropping," or moving lower in your abdomen.  You may have increased vaginal discharge.  Your cervix becomes thin and soft (effaced) near your due date. What to expect at prenatal visits You will have prenatal exams every 2 weeks until week 36. Then you will have weekly prenatal exams. During a routine prenatal visit:  You will be weighed to make sure you and the fetus are growing normally.  Your blood pressure will be taken.  Your abdomen will be measured to track your baby's growth.  The fetal heartbeat will be listened to.  Any test results from the previous visit will be discussed.  You may have a cervical check near your due date to see if you have effaced. At around 36 weeks, your health care provider will check your cervix. At the same time, your health care provider will also perform a test on the secretions of the vaginal tissue. This test is to determine if a type of bacteria, Group B streptococcus, is present. Your health care provider will explain this further. Your health care provider may ask you:    What your birth plan is.  How you are feeling.  If you are feeling the baby move.  If you have had any abnormal symptoms, such as leaking fluid, bleeding, severe headaches, or abdominal cramping.  If you are using any tobacco products, including cigarettes, chewing tobacco, and electronic cigarettes.  If you have any questions. Other tests or screenings that may be performed during your third trimester include:  Blood tests that check for low iron levels (anemia).  Fetal testing to check the health,  activity level, and growth of the fetus. Testing is done if you have certain medical conditions or if there are problems during the pregnancy.  Nonstress test (NST). This test checks the health of your baby to make sure there are no signs of problems, such as the baby not getting enough oxygen. During this test, a belt is placed around your belly. The baby is made to move, and its heart rate is monitored during movement. What is false labor? False labor is a condition in which you feel small, irregular tightenings of the muscles in the womb (contractions) that eventually go away. These are called Braxton Hicks contractions. Contractions may last for hours, days, or even weeks before true labor sets in. If contractions come at regular intervals, become more frequent, increase in intensity, or become painful, you should see your health care provider. What are the signs of labor?  Abdominal cramps.  Regular contractions that start at 10 minutes apart and become stronger and more frequent with time.  Contractions that start on the top of the uterus and spread down to the lower abdomen and back.  Increased pelvic pressure and dull back pain.  A watery or bloody mucus discharge that comes from the vagina.  Leaking of amniotic fluid. This is also known as your "water breaking." It could be a slow trickle or a gush. Let your doctor know if it has a color or strange odor. If you have any of these signs, call your health care provider right away, even if it is before your due date. Follow these instructions at home: Eating and drinking  Continue to eat regular, healthy meals.  Do not eat:  Raw meat or meat spreads.  Unpasteurized milk or cheese.  Unpasteurized juice.  Store-made salad.  Refrigerated smoked seafood.  Hot dogs or deli meat, unless they are piping hot.  More than 6 ounces of albacore tuna a week.  Shark, swordfish, king mackerel, or tile fish.  Store-made salads.  Raw  sprouts, such as mung bean or alfalfa sprouts.  Take prenatal vitamins as told by your health care provider.  Take 1000 mg of calcium daily as told by your health care provider.  If you develop constipation:  Take over-the-counter or prescription medicines.  Drink enough fluid to keep your urine clear or pale yellow.  Eat foods that are high in fiber, such as fresh fruits and vegetables, whole grains, and beans.  Limit foods that are high in fat and processed sugars, such as fried and sweet foods. Activity  Exercise only as directed by your health care provider. Healthy pregnant women should aim for 2 hours and 30 minutes of moderate exercise per week. If you experience any pain or discomfort while exercising, stop.  Avoid heavy lifting.  Do not exercise in extreme heat or humidity, or at high altitudes.  Wear low-heel, comfortable shoes.  Practice good posture.  Do not travel far distances unless it is absolutely necessary and only with the approval   of your health care provider.  Wear your seat belt at all times while in a car, on a bus, or on a plane.  Take frequent breaks and rest with your legs elevated if you have leg cramps or low back pain.  Do not use hot tubs, steam rooms, or saunas.  You may continue to have sex unless your health care provider tells you otherwise. Lifestyle  Do not use any products that contain nicotine or tobacco, such as cigarettes and e-cigarettes. If you need help quitting, ask your health care provider.  Do not drink alcohol.  Do not use any medicinal herbs or unprescribed drugs. These chemicals affect the formation and growth of the baby.  If you develop varicose veins:  Wear support pantyhose or compression stockings as told by your healthcare provider.  Elevate your feet for 15 minutes, 3-4 times a day.  Wear a supportive maternity bra to help with breast tenderness. General instructions  Take over-the-counter and prescription  medicines only as told by your health care provider. There are medicines that are either safe or unsafe to take during pregnancy.  Take warm sitz baths to soothe any pain or discomfort caused by hemorrhoids. Use hemorrhoid cream or witch hazel if your health care provider approves.  Avoid cat litter boxes and soil used by cats. These carry germs that can cause birth defects in the baby. If you have a cat, ask someone to clean the litter box for you.  To prepare for the arrival of your baby:  Take prenatal classes to understand, practice, and ask questions about the labor and delivery.  Make a trial run to the hospital.  Visit the hospital and tour the maternity area.  Arrange for maternity or paternity leave through employers.  Arrange for family and friends to take care of pets while you are in the hospital.  Purchase a rear-facing car seat and make sure you know how to install it in your car.  Pack your hospital bag.  Prepare the baby's nursery. Make sure to remove all pillows and stuffed animals from the baby's crib to prevent suffocation.  Visit your dentist if you have not gone during your pregnancy. Use a soft toothbrush to brush your teeth and be gentle when you floss.  Keep all prenatal follow-up visits as told by your health care provider. This is important. Contact a health care provider if:  You are unsure if you are in labor or if your water has broken.  You become dizzy.  You have mild pelvic cramps, pelvic pressure, or nagging pain in your abdominal area.  You have lower back pain.  You have persistent nausea, vomiting, or diarrhea.  You have an unusual or bad smelling vaginal discharge.  You have pain when you urinate. Get help right away if:  You have a fever.  You are leaking fluid from your vagina.  You have spotting or bleeding from your vagina.  You have severe abdominal pain or cramping.  You have rapid weight loss or weight gain.  You have  shortness of breath with chest pain.  You notice sudden or extreme swelling of your face, hands, ankles, feet, or legs.  Your baby makes fewer than 10 movements in 2 hours.  You have severe headaches that do not go away with medicine.  You have vision changes. Summary  The third trimester is from week 29 through week 40, months 7 through 9. The third trimester is a time when the unborn baby (fetus)   is growing rapidly.  During the third trimester, your discomfort may increase as you and your baby continue to gain weight. You may have abdominal, leg, and back pain, sleeping problems, and an increased need to urinate.  During the third trimester your breasts will keep growing and they will continue to become tender. A yellow fluid (colostrum) may leak from your breasts. This is the first milk you are producing for your baby.  False labor is a condition in which you feel small, irregular tightenings of the muscles in the womb (contractions) that eventually go away. These are called Braxton Hicks contractions. Contractions may last for hours, days, or even weeks before true labor sets in.  Signs of labor can include: abdominal cramps; regular contractions that start at 10 minutes apart and become stronger and more frequent with time; watery or bloody mucus discharge that comes from the vagina; increased pelvic pressure and dull back pain; and leaking of amniotic fluid. This information is not intended to replace advice given to you by your health care provider. Make sure you discuss any questions you have with your health care provider. Document Released: 01/01/2001 Document Revised: 06/15/2015 Document Reviewed: 03/10/2012 [employer] Interactive Patient Education  2017 [employer]  

## 2016-01-11 NOTE — Progress Notes (Signed)
NST reactive with one variable, and good recovery, BPP from 12/18 reviewed

## 2016-01-11 NOTE — Progress Notes (Signed)
   PRENATAL VISIT NOTE  Subjective:  Molly Savage is Molly Savage 25 y.o. G1P0 at 6057w5d being seen today for ongoing prenatal care.  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  Patient reports no complaints.  Contractions: Not present. Vag. Bleeding: None.  Movement: Present. Denies leaking of fluid.   The following portions of the patient's history were reviewed and updated as appropriate: allergies, current medications, past family history, past medical history, past social history, past surgical history and problem list. Problem list updated.  Objective:   Vitals:   01/11/16 0846  BP: 125/76  Pulse: (!) 108  Weight: 274 lb 3.2 oz (124.4 kg)    Fetal Status: Fetal Heart Rate (bpm): NST   Movement: Present     General:  Alert, oriented and cooperative. Patient is in no acute distress.  Skin: Skin is warm and dry. No rash noted.   Cardiovascular: Normal heart rate noted  Respiratory: Normal respiratory effort, no problems with respiration noted  Abdomen: Soft, gravid, appropriate for gestational age. Pain/Pressure: Present     Pelvic:  Cervical exam deferred        Extremities: Normal range of motion.     Mental Status: Normal mood and affect. Normal behavior. Normal judgment and thought content.   Assessment and Plan:  Pregnancy: G1P0 at 3657w5d  1. Pre-existing insulin-dependent diabetes mellitus during pregnancy, third trimester (HCC) FBS 163 today,increase Levemir to  110 units Hs - Fetal nonstress test  2. Supervision of high risk pregnancy, antepartum Nl growth [place] 12/18  Preterm labor symptoms and general obstetric precautions including but not limited to vaginal bleeding, contractions, leaking of fluid and fetal movement were reviewed in detail with the patient. Please refer to After Visit Summary for other counseling recommendations.  Return in about 8 days (around 01/19/2016) for Ob fu and NST;  1/5  Ob fu and NST.   Adam PhenixJames G Arnold, MD

## 2016-01-16 ENCOUNTER — Ambulatory Visit (HOSPITAL_COMMUNITY)
Admission: RE | Admit: 2016-01-16 | Discharge: 2016-01-16 | Disposition: A | Discharge status: Home / Self Care | Payer: Medicaid Other | Source: Ambulatory Visit | Attending: Obstetrics & Gynecology | Admitting: Obstetrics & Gynecology

## 2016-01-16 ENCOUNTER — Other Ambulatory Visit (HOSPITAL_COMMUNITY): Payer: Self-pay | Admitting: Obstetrics and Gynecology

## 2016-01-16 ENCOUNTER — Encounter (HOSPITAL_COMMUNITY): Payer: Self-pay

## 2016-01-16 DIAGNOSIS — E669 Obesity, unspecified: Secondary | ICD-10-CM | POA: Insufficient documentation

## 2016-01-16 DIAGNOSIS — Z794 Long term (current) use of insulin: Secondary | ICD-10-CM | POA: Diagnosis not present

## 2016-01-16 DIAGNOSIS — O24313 Unspecified pre-existing diabetes mellitus in pregnancy, third trimester: Secondary | ICD-10-CM

## 2016-01-16 DIAGNOSIS — O99333 Smoking (tobacco) complicating pregnancy, third trimester: Secondary | ICD-10-CM

## 2016-01-16 DIAGNOSIS — E1140 Type 2 diabetes mellitus with diabetic neuropathy, unspecified: Secondary | ICD-10-CM | POA: Insufficient documentation

## 2016-01-16 DIAGNOSIS — O24919 Unspecified diabetes mellitus in pregnancy, unspecified trimester: Secondary | ICD-10-CM

## 2016-01-16 DIAGNOSIS — O99213 Obesity complicating pregnancy, third trimester: Secondary | ICD-10-CM | POA: Insufficient documentation

## 2016-01-16 DIAGNOSIS — E11319 Type 2 diabetes mellitus with unspecified diabetic retinopathy without macular edema: Secondary | ICD-10-CM | POA: Diagnosis not present

## 2016-01-16 DIAGNOSIS — Z3A36 36 weeks gestation of pregnancy: Secondary | ICD-10-CM

## 2016-01-16 DIAGNOSIS — Z6839 Body mass index (BMI) 39.0-39.9, adult: Secondary | ICD-10-CM | POA: Insufficient documentation

## 2016-01-18 ENCOUNTER — Ambulatory Visit (INDEPENDENT_AMBULATORY_CARE_PROVIDER_SITE_OTHER): Payer: Medicaid Other | Admitting: Family Medicine

## 2016-01-18 ENCOUNTER — Other Ambulatory Visit (HOSPITAL_COMMUNITY)
Admission: RE | Admit: 2016-01-18 | Discharge: 2016-01-18 | Disposition: A | Discharge status: Home / Self Care | Payer: Medicaid Other | Source: Ambulatory Visit | Attending: Family Medicine | Admitting: Family Medicine

## 2016-01-18 ENCOUNTER — Other Ambulatory Visit: Payer: Self-pay | Admitting: Family Medicine

## 2016-01-18 VITALS — BP 123/75 | HR 89 | Wt 275.7 lb

## 2016-01-18 DIAGNOSIS — E10319 Type 1 diabetes mellitus with unspecified diabetic retinopathy without macular edema: Secondary | ICD-10-CM | POA: Diagnosis not present

## 2016-01-18 DIAGNOSIS — O0990 Supervision of high risk pregnancy, unspecified, unspecified trimester: Secondary | ICD-10-CM

## 2016-01-18 DIAGNOSIS — Z794 Long term (current) use of insulin: Secondary | ICD-10-CM | POA: Diagnosis not present

## 2016-01-18 DIAGNOSIS — O24313 Unspecified pre-existing diabetes mellitus in pregnancy, third trimester: Secondary | ICD-10-CM

## 2016-01-18 DIAGNOSIS — O10919 Unspecified pre-existing hypertension complicating pregnancy, unspecified trimester: Secondary | ICD-10-CM | POA: Diagnosis not present

## 2016-01-18 DIAGNOSIS — Z113 Encounter for screening for infections with a predominantly sexual mode of transmission: Secondary | ICD-10-CM | POA: Insufficient documentation

## 2016-01-18 DIAGNOSIS — IMO0001 Reserved for inherently not codable concepts without codable children: Secondary | ICD-10-CM

## 2016-01-18 LAB — POCT URINALYSIS DIP (DEVICE)
Bilirubin Urine: NEGATIVE
Glucose, UA: NEGATIVE mg/dL
Hgb urine dipstick: NEGATIVE
Ketones, ur: NEGATIVE mg/dL
Nitrite: NEGATIVE
Protein, ur: NEGATIVE mg/dL
Specific Gravity, Urine: 1.02 (ref 1.005–1.030)
Urobilinogen, UA: 2 mg/dL — ABNORMAL HIGH (ref 0.0–1.0)
pH: 6.5 (ref 5.0–8.0)

## 2016-01-18 NOTE — Patient Instructions (Signed)
Third Trimester of Pregnancy The third trimester is from week 29 through week 40 (months 7 through 9). The third trimester is a time when the unborn baby (fetus) is growing rapidly. At the end of the ninth month, the fetus is about 20 inches in length and weighs 6-10 pounds. Body changes during your third trimester Your body goes through many changes during pregnancy. The changes vary from woman to woman. During the third trimester:  Your weight will continue to increase. You can expect to gain 25-35 pounds (11-16 kg) by the end of the pregnancy.  You may begin to get stretch marks on your hips, abdomen, and breasts.  You may urinate more often because the fetus is moving lower into your pelvis and pressing on your bladder.  You may develop or continue to have heartburn. This is caused by increased hormones that slow down muscles in the digestive tract.  You may develop or continue to have constipation because increased hormones slow digestion and cause the muscles that push waste through your intestines to relax.  You may develop hemorrhoids. These are swollen veins (varicose veins) in the rectum that can itch or be painful.  You may develop swollen, bulging veins (varicose veins) in your legs.  You may have increased body aches in the pelvis, back, or thighs. This is due to weight gain and increased hormones that are relaxing your joints.  You may have changes in your hair. These can include thickening of your hair, rapid growth, and changes in texture. Some women also have hair loss during or after pregnancy, or hair that feels dry or thin. Your hair will most likely return to normal after your baby is born.  Your breasts will continue to grow and they will continue to become tender. A yellow fluid (colostrum) may leak from your breasts. This is the first milk you are producing for your baby.  Your belly button may stick out.  You may notice more swelling in your hands, face, or  ankles.  You may have increased tingling or numbness in your hands, arms, and legs. The skin on your belly may also feel numb.  You may feel short of breath because of your expanding uterus.  You may have more problems sleeping. This can be caused by the size of your belly, increased need to urinate, and an increase in your body's metabolism.  You may notice the fetus "dropping," or moving lower in your abdomen.  You may have increased vaginal discharge.  Your cervix becomes thin and soft (effaced) near your due date. What to expect at prenatal visits You will have prenatal exams every 2 weeks until week 36. Then you will have weekly prenatal exams. During a routine prenatal visit:  You will be weighed to make sure you and the fetus are growing normally.  Your blood pressure will be taken.  Your abdomen will be measured to track your baby's growth.  The fetal heartbeat will be listened to.  Any test results from the previous visit will be discussed.  You may have a cervical check near your due date to see if you have effaced. At around 36 weeks, your health care provider will check your cervix. At the same time, your health care provider will also perform a test on the secretions of the vaginal tissue. This test is to determine if a type of bacteria, Group B streptococcus, is present. Your health care provider will explain this further. Your health care provider may ask you:    What your birth plan is.  How you are feeling.  If you are feeling the baby move.  If you have had any abnormal symptoms, such as leaking fluid, bleeding, severe headaches, or abdominal cramping.  If you are using any tobacco products, including cigarettes, chewing tobacco, and electronic cigarettes.  If you have any questions. Other tests or screenings that may be performed during your third trimester include:  Blood tests that check for low iron levels (anemia).  Fetal testing to check the health,  activity level, and growth of the fetus. Testing is done if you have certain medical conditions or if there are problems during the pregnancy.  Nonstress test (NST). This test checks the health of your baby to make sure there are no signs of problems, such as the baby not getting enough oxygen. During this test, a belt is placed around your belly. The baby is made to move, and its heart rate is monitored during movement. What is false labor? False labor is a condition in which you feel small, irregular tightenings of the muscles in the womb (contractions) that eventually go away. These are called Braxton Hicks contractions. Contractions may last for hours, days, or even weeks before true labor sets in. If contractions come at regular intervals, become more frequent, increase in intensity, or become painful, you should see your health care provider. What are the signs of labor?  Abdominal cramps.  Regular contractions that start at 10 minutes apart and become stronger and more frequent with time.  Contractions that start on the top of the uterus and spread down to the lower abdomen and back.  Increased pelvic pressure and dull back pain.  A watery or bloody mucus discharge that comes from the vagina.  Leaking of amniotic fluid. This is also known as your "water breaking." It could be a slow trickle or a gush. Let your doctor know if it has a color or strange odor. If you have any of these signs, call your health care provider right away, even if it is before your due date. Follow these instructions at home: Eating and drinking  Continue to eat regular, healthy meals.  Do not eat:  Raw meat or meat spreads.  Unpasteurized milk or cheese.  Unpasteurized juice.  Store-made salad.  Refrigerated smoked seafood.  Hot dogs or deli meat, unless they are piping hot.  More than 6 ounces of albacore tuna a week.  Shark, swordfish, king mackerel, or tile fish.  Store-made salads.  Raw  sprouts, such as mung bean or alfalfa sprouts.  Take prenatal vitamins as told by your health care provider.  Take 1000 mg of calcium daily as told by your health care provider.  If you develop constipation:  Take over-the-counter or prescription medicines.  Drink enough fluid to keep your urine clear or pale yellow.  Eat foods that are high in fiber, such as fresh fruits and vegetables, whole grains, and beans.  Limit foods that are high in fat and processed sugars, such as fried and sweet foods. Activity  Exercise only as directed by your health care provider. Healthy pregnant women should aim for 2 hours and 30 minutes of moderate exercise per week. If you experience any pain or discomfort while exercising, stop.  Avoid heavy lifting.  Do not exercise in extreme heat or humidity, or at high altitudes.  Wear low-heel, comfortable shoes.  Practice good posture.  Do not travel far distances unless it is absolutely necessary and only with the approval   of your health care provider.  Wear your seat belt at all times while in a car, on a bus, or on a plane.  Take frequent breaks and rest with your legs elevated if you have leg cramps or low back pain.  Do not use hot tubs, steam rooms, or saunas.  You may continue to have sex unless your health care provider tells you otherwise. Lifestyle  Do not use any products that contain nicotine or tobacco, such as cigarettes and e-cigarettes. If you need help quitting, ask your health care provider.  Do not drink alcohol.  Do not use any medicinal herbs or unprescribed drugs. These chemicals affect the formation and growth of the baby.  If you develop varicose veins:  Wear support pantyhose or compression stockings as told by your healthcare provider.  Elevate your feet for 15 minutes, 3-4 times a day.  Wear a supportive maternity bra to help with breast tenderness. General instructions  Take over-the-counter and prescription  medicines only as told by your health care provider. There are medicines that are either safe or unsafe to take during pregnancy.  Take warm sitz baths to soothe any pain or discomfort caused by hemorrhoids. Use hemorrhoid cream or witch hazel if your health care provider approves.  Avoid cat litter boxes and soil used by cats. These carry germs that can cause birth defects in the baby. If you have a cat, ask someone to clean the litter box for you.  To prepare for the arrival of your baby:  Take prenatal classes to understand, practice, and ask questions about the labor and delivery.  Make a trial run to the hospital.  Visit the hospital and tour the maternity area.  Arrange for maternity or paternity leave through employers.  Arrange for family and friends to take care of pets while you are in the hospital.  Purchase a rear-facing car seat and make sure you know how to install it in your car.  Pack your hospital bag.  Prepare the baby's nursery. Make sure to remove all pillows and stuffed animals from the baby's crib to prevent suffocation.  Visit your dentist if you have not gone during your pregnancy. Use a soft toothbrush to brush your teeth and be gentle when you floss.  Keep all prenatal follow-up visits as told by your health care provider. This is important. Contact a health care provider if:  You are unsure if you are in labor or if your water has broken.  You become dizzy.  You have mild pelvic cramps, pelvic pressure, or nagging pain in your abdominal area.  You have lower back pain.  You have persistent nausea, vomiting, or diarrhea.  You have an unusual or bad smelling vaginal discharge.  You have pain when you urinate. Get help right away if:  You have a fever.  You are leaking fluid from your vagina.  You have spotting or bleeding from your vagina.  You have severe abdominal pain or cramping.  You have rapid weight loss or weight gain.  You have  shortness of breath with chest pain.  You notice sudden or extreme swelling of your face, hands, ankles, feet, or legs.  Your baby makes fewer than 10 movements in 2 hours.  You have severe headaches that do not go away with medicine.  You have vision changes. Summary  The third trimester is from week 29 through week 40, months 7 through 9. The third trimester is a time when the unborn baby (fetus)   is growing rapidly.  During the third trimester, your discomfort may increase as you and your baby continue to gain weight. You may have abdominal, leg, and back pain, sleeping problems, and an increased need to urinate.  During the third trimester your breasts will keep growing and they will continue to become tender. A yellow fluid (colostrum) may leak from your breasts. This is the first milk you are producing for your baby.  False labor is a condition in which you feel small, irregular tightenings of the muscles in the womb (contractions) that eventually go away. These are called Braxton Hicks contractions. Contractions may last for hours, days, or even weeks before true labor sets in.  Signs of labor can include: abdominal cramps; regular contractions that start at 10 minutes apart and become stronger and more frequent with time; watery or bloody mucus discharge that comes from the vagina; increased pelvic pressure and dull back pain; and leaking of amniotic fluid. This information is not intended to replace advice given to you by your health care provider. Make sure you discuss any questions you have with your health care provider. Document Released: 01/01/2001 Document Revised: 06/15/2015 Document Reviewed: 03/10/2012 [employer] Interactive Patient Education  2017 [employer]   Breastfeeding Deciding to breastfeed is one of the best choices you can make for you and your baby. A change in hormones during pregnancy causes your breast tissue to grow and increases the number and size of your  milk ducts. These hormones also allow proteins, sugars, and fats from your blood supply to make breast milk in your milk-producing glands. Hormones prevent breast milk from being released before your baby is born as well as prompt milk flow after birth. Once breastfeeding has begun, thoughts of your baby, as well as his or her sucking or crying, can stimulate the release of milk from your milk-producing glands. Benefits of breastfeeding For Your Baby  Your first milk (colostrum) helps your baby's digestive system function better.  There are antibodies in your milk that help your baby fight off infections.  Your baby has a lower incidence of asthma, allergies, and sudden infant death syndrome.  The nutrients in breast milk are better for your baby than infant formulas and are designed uniquely for your baby's needs.  Breast milk improves your baby's brain development.  Your baby is less likely to develop other conditions, such as childhood obesity, asthma, or type 2 diabetes mellitus. For You  Breastfeeding helps to create a very special bond between you and your baby.  Breastfeeding is convenient. Breast milk is always available at the correct temperature and costs nothing.  Breastfeeding helps to burn calories and helps you lose the weight gained during pregnancy.  Breastfeeding makes your uterus contract to its prepregnancy size faster and slows bleeding (lochia) after you give birth.  Breastfeeding helps to lower your risk of developing type 2 diabetes mellitus, osteoporosis, and breast or ovarian cancer later in life. Signs that your baby is hungry Early Signs of Hunger  Increased alertness or activity.  Stretching.  Movement of the head from side to side.  Movement of the head and opening of the mouth when the corner of the mouth or cheek is stroked (rooting).  Increased sucking sounds, smacking lips, cooing, sighing, or squeaking.  Hand-to-mouth movements.  Increased  sucking of fingers or hands. Late Signs of Hunger  Fussing.  Intermittent crying. Extreme Signs of Hunger  Signs of extreme hunger will require calming and consoling before your baby will   be able to breastfeed successfully. Do not wait for the following signs of extreme hunger to occur before you initiate breastfeeding:  Restlessness.  A loud, strong cry.  Screaming. Breastfeeding basics  Breastfeeding Initiation  Find a comfortable place to sit or lie down, with your neck and back well supported.  Place a pillow or rolled up blanket under your baby to bring him or her to the level of your breast (if you are seated). Nursing pillows are specially designed to help support your arms and your baby while you breastfeed.  Make sure that your baby's abdomen is facing your abdomen.  Gently massage your breast. With your fingertips, massage from your chest wall toward your nipple in a circular motion. This encourages milk flow. You may need to continue this action during the feeding if your milk flows slowly.  Support your breast with 4 fingers underneath and your thumb above your nipple. Make sure your fingers are well away from your nipple and your baby's mouth.  Stroke your baby's lips gently with your finger or nipple.  When your baby's mouth is open wide enough, quickly bring your baby to your breast, placing your entire nipple and as much of the colored area around your nipple (areola) as possible into your baby's mouth.  More areola should be visible above your baby's upper lip than below the lower lip.  Your baby's tongue should be between his or her lower gum and your breast.  Ensure that your baby's mouth is correctly positioned around your nipple (latched). Your baby's lips should create a seal on your breast and be turned out (everted).  It is common for your baby to suck about 2-3 minutes in order to start the flow of breast milk. Latching  Teaching your baby how to latch  on to your breast properly is very important. An improper latch can cause nipple pain and decreased milk supply for you and poor weight gain in your baby. Also, if your baby is not latched onto your nipple properly, he or she may swallow some air during feeding. This can make your baby fussy. Burping your baby when you switch breasts during the feeding can help to get rid of the air. However, teaching your baby to latch on properly is still the best way to prevent fussiness from swallowing air while breastfeeding. Signs that your baby has successfully latched on to your nipple:  Silent tugging or silent sucking, without causing you pain.  Swallowing heard between every 3-4 sucks.  Muscle movement above and in front of his or her ears while sucking. Signs that your baby has not successfully latched on to nipple:  Sucking sounds or smacking sounds from your baby while breastfeeding.  Nipple pain. If you think your baby has not latched on correctly, slip your finger into the corner of your baby's mouth to break the suction and place it between your baby's gums. Attempt breastfeeding initiation again. Signs of Successful Breastfeeding  Signs from your baby:  A gradual decrease in the number of sucks or complete cessation of sucking.  Falling asleep.  Relaxation of his or her body.  Retention of a small amount of milk in his or her mouth.  Letting go of your breast by himself or herself. Signs from you:  Breasts that have increased in firmness, weight, and size 1-3 hours after feeding.  Breasts that are softer immediately after breastfeeding.  Increased milk volume, as well as a change in milk consistency and color by   the fifth day of breastfeeding.  Nipples that are not sore, cracked, or bleeding. Signs That Your Baby is Getting Enough Milk  Wetting at least 1-2 diapers during the first 24 hours after birth.  Wetting at least 5-6 diapers every 24 hours for the first week after  birth. The urine should be clear or pale yellow by 5 days after birth.  Wetting 6-8 diapers every 24 hours as your baby continues to grow and develop.  At least 3 stools in a 24-hour period by age 5 days. The stool should be soft and yellow.  At least 3 stools in a 24-hour period by age 7 days. The stool should be seedy and yellow.  No loss of weight greater than 10% of birth weight during the first 3 days of age.  Average weight gain of 4-7 ounces (113-198 g) per week after age 4 days.  Consistent daily weight gain by age 5 days, without weight loss after the age of 2 weeks. After a feeding, your baby may spit up a small amount. This is common. Breastfeeding frequency and duration Frequent feeding will help you make more milk and can prevent sore nipples and breast engorgement. Breastfeed when you feel the need to reduce the fullness of your breasts or when your baby shows signs of hunger. This is called "breastfeeding on demand." Avoid introducing a pacifier to your baby while you are working to establish breastfeeding (the first 4-6 weeks after your baby is born). After this time you may choose to use a pacifier. Research has shown that pacifier use during the first year of a baby's life decreases the risk of sudden infant death syndrome (SIDS). Allow your baby to feed on each breast as long as he or she wants. Breastfeed until your baby is finished feeding. When your baby unlatches or falls asleep while feeding from the first breast, offer the second breast. Because newborns are often sleepy in the first few weeks of life, you may need to awaken your baby to get him or her to feed. Breastfeeding times will vary from baby to baby. However, the following rules can serve as a guide to help you ensure that your baby is properly fed:  Newborns (babies 4 weeks of age or younger) may breastfeed every 1-3 hours.  Newborns should not go longer than 3 hours during the day or 5 hours during the night  without breastfeeding.  You should breastfeed your baby a minimum of 8 times in a 24-hour period until you begin to introduce solid foods to your baby at around 6 months of age. Breast milk pumping Pumping and storing breast milk allows you to ensure that your baby is exclusively fed your breast milk, even at times when you are unable to breastfeed. This is especially important if you are going back to work while you are still breastfeeding or when you are not able to be present during feedings. Your lactation consultant can give you guidelines on how long it is safe to store breast milk. A breast pump is a machine that allows you to pump milk from your breast into a sterile bottle. The pumped breast milk can then be stored in a refrigerator or freezer. Some breast pumps are operated by hand, while others use electricity. Ask your lactation consultant which type will work best for you. Breast pumps can be purchased, but some hospitals and breastfeeding support groups lease breast pumps on a monthly basis. A lactation consultant can teach you how to   hand express breast milk, if you prefer not to use a pump. Caring for your breasts while you breastfeed Nipples can become dry, cracked, and sore while breastfeeding. The following recommendations can help keep your breasts moisturized and healthy:  Avoid using soap on your nipples.  Wear a supportive bra. Although not required, special nursing bras and tank tops are designed to allow access to your breasts for breastfeeding without taking off your entire bra or top. Avoid wearing underwire-style bras or extremely tight bras.  Air dry your nipples for 3-4minutes after each feeding.  Use only cotton bra pads to absorb leaked breast milk. Leaking of breast milk between feedings is normal.  Use lanolin on your nipples after breastfeeding. Lanolin helps to maintain your skin's normal moisture barrier. If you use pure lanolin, you do not need to wash it off  before feeding your baby again. Pure lanolin is not toxic to your baby. You may also hand express a few drops of breast milk and gently massage that milk into your nipples and allow the milk to air dry. In the first few weeks after giving birth, some women experience extremely full breasts (engorgement). Engorgement can make your breasts feel heavy, warm, and tender to the touch. Engorgement peaks within 3-5 days after you give birth. The following recommendations can help ease engorgement:  Completely empty your breasts while breastfeeding or pumping. You may want to start by applying warm, moist heat (in the shower or with warm water-soaked hand towels) just before feeding or pumping. This increases circulation and helps the milk flow. If your baby does not completely empty your breasts while breastfeeding, pump any extra milk after he or she is finished.  Wear a snug bra (nursing or regular) or tank top for 1-2 days to signal your body to slightly decrease milk production.  Apply ice packs to your breasts, unless this is too uncomfortable for you.  Make sure that your baby is latched on and positioned properly while breastfeeding. If engorgement persists after 48 hours of following these recommendations, contact your health care provider or a lactation consultant. Overall health care recommendations while breastfeeding  Eat healthy foods. Alternate between meals and snacks, eating 3 of each per day. Because what you eat affects your breast milk, some of the foods may make your baby more irritable than usual. Avoid eating these foods if you are sure that they are negatively affecting your baby.  Drink milk, fruit juice, and water to satisfy your thirst (about 10 glasses a day).  Rest often, relax, and continue to take your prenatal vitamins to prevent fatigue, stress, and anemia.  Continue breast self-awareness checks.  Avoid chewing and smoking tobacco. Chemicals from cigarettes that pass  into breast milk and exposure to secondhand smoke may harm your baby.  Avoid alcohol and drug use, including marijuana. Some medicines that may be harmful to your baby can pass through breast milk. It is important to ask your health care provider before taking any medicine, including all over-the-counter and prescription medicine as well as vitamin and herbal supplements. It is possible to become pregnant while breastfeeding. If birth control is desired, ask your health care provider about options that will be safe for your baby. Contact a health care provider if:  You feel like you want to stop breastfeeding or have become frustrated with breastfeeding.  You have painful breasts or nipples.  Your nipples are cracked or bleeding.  Your breasts are red, tender, or warm.  You have   a swollen area on either breast.  You have a fever or chills.  You have nausea or vomiting.  You have drainage other than breast milk from your nipples.  Your breasts do not become full before feedings by the fifth day after you give birth.  You feel sad and depressed.  Your baby is too sleepy to eat well.  Your baby is having trouble sleeping.  Your baby is wetting less than 3 diapers in a 24-hour period.  Your baby has less than 3 stools in a 24-hour period.  Your baby's skin or the white part of his or her eyes becomes yellow.  Your baby is not gaining weight by 5 days of age. Get help right away if:  Your baby is overly tired (lethargic) and does not want to wake up and feed.  Your baby develops an unexplained fever. This information is not intended to replace advice given to you by your health care provider. Make sure you discuss any questions you have with your health care provider. Document Released: 01/07/2005 Document Revised: 06/21/2015 Document Reviewed: 07/01/2012 Elsevier Interactive Patient Education  2017 Elsevier Inc.  

## 2016-01-18 NOTE — Progress Notes (Signed)
Pt is receiving weekly BPP per MFM.

## 2016-01-18 NOTE — Progress Notes (Signed)
    PRENATAL VISIT NOTE  Subjective:  Molly Savage is a 25 y.o. G1P0 at 2673w5d being seen today for ongoing prenatal care.  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  Patient reports no complaints.  Contractions: Irregular. Vag. Bleeding: None.  Movement: Present. Denies leaking of fluid.   The following portions of the patient's history were reviewed and updated as appropriate: allergies, current medications, past family history, past medical history, past social history, past surgical history and problem list. Problem list updated.  Objective:   Vitals:   01/18/16 1301  BP: 123/75  Pulse: 89  Weight: 275 lb 11.2 oz (125.1 kg)    Fetal Status: Fetal Heart Rate (bpm): NST   Movement: Present     General:  Alert, oriented and cooperative. Patient is in no acute distress.  Skin: Skin is warm and dry. No rash noted.   Cardiovascular: Normal heart rate noted  Respiratory: Normal respiratory effort, no problems with respiration noted  Abdomen: Soft, gravid, appropriate for gestational age. Pain/Pressure: Present     Pelvic:  Cervical exam performed Dilation: Closed Effacement (%): Thick Station: -2  Extremities: Normal range of motion.  Edema: Trace  Mental Status: Normal mood and affect. Normal behavior. Normal judgment and thought content.  Reports BS fasting are 70's since increasing Levemir last week. U/s for growth 01/08/16 5 lb 11 oz(57%), vtx, AFI 10.24 NST  reviewed and reactive. Assessment and Plan:  Pregnancy: G1P0 at 8173w5d  1. Pre-existing insulin-dependent diabetes mellitus during pregnancy, third trimester (HCC) Continue Insulin at current doses - Fetal nonstress test  2. Supervision of high risk pregnancy, antepartum  - Culture, beta strep (group b only) - GC/Chlamydia probe amp ([place]  3. Chronic hypertension complicating pregnancy, antepartum BP ok today - Fetal nonstress test  4. 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 Please refer to After Visit Summary for other counseling recommendations.  Return in about 7 days (around 01/25/2016) for as scheduled, HRC.   Reva Boresanya S Neftaly Inzunza, MD

## 2016-01-19 LAB — GC/CHLAMYDIA PROBE AMP (~~LOC~~) NOT AT ARMC
Chlamydia: NEGATIVE
Neisseria Gonorrhea: NEGATIVE

## 2016-01-20 LAB — OB RESULTS CONSOLE GBS: GBS: POSITIVE

## 2016-01-20 LAB — CULTURE, BETA STREP (GROUP B ONLY)

## 2016-01-22 HISTORY — PX: TOOTH EXTRACTION: SUR596

## 2016-01-23 ENCOUNTER — Ambulatory Visit (HOSPITAL_COMMUNITY): Admission: RE | Admit: 2016-01-23 | Payer: Medicaid Other | Source: Ambulatory Visit

## 2016-01-23 ENCOUNTER — Encounter: Payer: Self-pay | Admitting: Family Medicine

## 2016-01-25 ENCOUNTER — Ambulatory Visit (INDEPENDENT_AMBULATORY_CARE_PROVIDER_SITE_OTHER): Payer: Medicaid Other | Admitting: Family

## 2016-01-25 VITALS — BP 131/78 | HR 98 | Wt 280.3 lb

## 2016-01-25 DIAGNOSIS — O24313 Unspecified pre-existing diabetes mellitus in pregnancy, third trimester: Secondary | ICD-10-CM | POA: Diagnosis present

## 2016-01-25 DIAGNOSIS — O10913 Unspecified pre-existing hypertension complicating pregnancy, third trimester: Secondary | ICD-10-CM

## 2016-01-25 DIAGNOSIS — O0990 Supervision of high risk pregnancy, unspecified, unspecified trimester: Secondary | ICD-10-CM

## 2016-01-25 DIAGNOSIS — Z794 Long term (current) use of insulin: Secondary | ICD-10-CM

## 2016-01-25 DIAGNOSIS — IMO0001 Reserved for inherently not codable concepts without codable children: Secondary | ICD-10-CM

## 2016-01-25 DIAGNOSIS — O10919 Unspecified pre-existing hypertension complicating pregnancy, unspecified trimester: Secondary | ICD-10-CM

## 2016-01-25 DIAGNOSIS — O24013 Pre-existing diabetes mellitus, type 1, in pregnancy, third trimester: Secondary | ICD-10-CM

## 2016-01-25 LAB — POCT URINALYSIS DIP (DEVICE)
Glucose, UA: NEGATIVE mg/dL
Hgb urine dipstick: NEGATIVE
Ketones, ur: NEGATIVE mg/dL
Nitrite: NEGATIVE
Protein, ur: NEGATIVE mg/dL
Specific Gravity, Urine: 1.025 (ref 1.005–1.030)
Urobilinogen, UA: 2 mg/dL — ABNORMAL HIGH (ref 0.0–1.0)
pH: 6 (ref 5.0–8.0)

## 2016-01-25 NOTE — Progress Notes (Signed)
Did not bring log; reviewed meter:   FBS  85 74 58  89  131 73  44 PBK 93 82 158 141  80  99 99 PL 98 113 85  182  95  47 PD 128 142 74  70  81  129

## 2016-01-25 NOTE — Progress Notes (Signed)
Pt has weekly BPP @ MFM.  Next US for growth on 1/16.

## 2016-01-25 NOTE — Progress Notes (Signed)
   PRENATAL VISIT NOTE  Subjective:  Molly Savage is a 26 y.o. G1P0 at 16110w5d being seen today for ongoing prenatal care.  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  Patient reports no complaints.  Contractions: Irregular. Vag. Bleeding: None.  Movement: Present. Denies leaking of fluid.   The following portions of the patient's history were reviewed and updated as appropriate: allergies, current medications, past family history, past medical history, past social history, past surgical history and problem list. Problem list updated.  Objective:   Vitals:   01/25/16 0913  BP: 131/78  Pulse: 98  Weight: 280 lb 4.8 oz (127.1 kg)    Fetal Status: Fetal Heart Rate (bpm): NST-Reactive Fundal Height: 36 cm Movement: Present     General:  Alert, oriented and cooperative. Patient is in no acute distress.  Skin: Skin is warm and dry. No rash noted.   Cardiovascular: Normal heart rate noted  Respiratory: Normal respiratory effort, no problems with respiration noted  Abdomen: Soft, gravid, appropriate for gestational age. Pain/Pressure: Present     Pelvic:  Cervical exam deferred        Extremities: Normal range of motion.  Edema: Trace  Mental Status: Normal mood and affect. Normal behavior. Normal judgment and thought content.   Assessment and Plan:  Pregnancy: G1P0 at 11110w5d  1. Pre-existing insulin-dependent diabetes  mellitus during pregnancy, third trimester (HCC) - Did not bring log; reviewed meter:   FBS  85 74 58  89  131 73  44 PBK 93 82 158 141  80  99 99 PL 98 113 85  182  95  47 PD 128 142 74  70  81  129 - Continue current insulin dosage - Fetal nonstress test  2. Supervision of high risk pregnancy, antepartum - Obtain Rubella screen   3. Chronic hypertension complicating pregnancy, antepartum - Fetal nonstress test - Last growth US 35 wks 57%ile - BPP scheduled for next week  - IOL scheduled for 39 wks   Term labor symptoms and general obstetric precautions including but not limited to vaginal bleeding, contractions, leaking of fluid and fetal movement were reviewed in detail with the patient. Please refer to After Visit Summary for other counseling recommendations.  Return in about 7 days (around 02/01/2016) for as scheduled.   Eino FarberWalidah Kennith GainN Karim, CNM

## 2016-01-26 LAB — RUBELLA SCREEN: Rubella: 2.21 Index — ABNORMAL HIGH (ref <0.90)

## 2016-01-30 ENCOUNTER — Encounter (HOSPITAL_COMMUNITY): Payer: Self-pay

## 2016-01-30 ENCOUNTER — Other Ambulatory Visit (HOSPITAL_COMMUNITY): Payer: Self-pay | Admitting: Obstetrics and Gynecology

## 2016-01-30 ENCOUNTER — Telehealth (HOSPITAL_COMMUNITY): Payer: Self-pay | Admitting: *Deleted

## 2016-01-30 ENCOUNTER — Ambulatory Visit (HOSPITAL_COMMUNITY)
Admission: RE | Admit: 2016-01-30 | Discharge: 2016-01-30 | Disposition: A | Discharge status: Home / Self Care | Payer: Medicaid Other | Source: Ambulatory Visit | Attending: Obstetrics & Gynecology | Admitting: Obstetrics & Gynecology

## 2016-01-30 ENCOUNTER — Other Ambulatory Visit: Payer: Self-pay | Admitting: Obstetrics and Gynecology

## 2016-01-30 ENCOUNTER — Other Ambulatory Visit (HOSPITAL_COMMUNITY): Payer: Self-pay | Admitting: *Deleted

## 2016-01-30 DIAGNOSIS — O24313 Unspecified pre-existing diabetes mellitus in pregnancy, third trimester: Secondary | ICD-10-CM

## 2016-01-30 DIAGNOSIS — Z794 Long term (current) use of insulin: Secondary | ICD-10-CM

## 2016-01-30 DIAGNOSIS — O99213 Obesity complicating pregnancy, third trimester: Secondary | ICD-10-CM

## 2016-01-30 DIAGNOSIS — Z3A38 38 weeks gestation of pregnancy: Secondary | ICD-10-CM

## 2016-01-30 DIAGNOSIS — O99333 Smoking (tobacco) complicating pregnancy, third trimester: Secondary | ICD-10-CM | POA: Insufficient documentation

## 2016-01-30 DIAGNOSIS — O24019 Pre-existing diabetes mellitus, type 1, in pregnancy, unspecified trimester: Secondary | ICD-10-CM

## 2016-01-30 DIAGNOSIS — O24919 Unspecified diabetes mellitus in pregnancy, unspecified trimester: Secondary | ICD-10-CM

## 2016-01-30 NOTE — Telephone Encounter (Signed)
Preadmission screen  

## 2016-01-30 NOTE — Telephone Encounter (Signed)
TC to pt to notify her of her induction appointment for 01/31/16 at 7am, pt to arrive at 6:45.  Pt voices understanding.  Per Dr. Debroah LoopArnold pt is to take half of her usual dose of long acting insulin in the am.  Pt states she takes long acting insulin (levemir) at night.  Discussed with Dr. Monte FantasiaArnold-pt to take normal dose, as she has been taking insulin.  Pt voices understanding.  No further questions from patient.

## 2016-01-31 ENCOUNTER — Inpatient Hospital Stay (HOSPITAL_COMMUNITY): Payer: Medicaid Other | Admitting: Anesthesiology

## 2016-01-31 ENCOUNTER — Inpatient Hospital Stay (HOSPITAL_COMMUNITY)
Admission: RE | Admit: 2016-01-31 | Discharge: 2016-02-03 | DRG: 765 | Disposition: A | Discharge status: Home / Self Care | Payer: Medicaid Other | Source: Ambulatory Visit | Attending: Obstetrics and Gynecology | Admitting: Obstetrics and Gynecology

## 2016-01-31 ENCOUNTER — Encounter (HOSPITAL_COMMUNITY): Payer: Self-pay

## 2016-01-31 ENCOUNTER — Ambulatory Visit (HOSPITAL_COMMUNITY): Payer: Medicaid Other

## 2016-01-31 VITALS — BP 145/81 | HR 81 | Temp 98.1°F | Resp 18 | Ht 68.0 in | Wt 289.0 lb

## 2016-01-31 DIAGNOSIS — Z794 Long term (current) use of insulin: Secondary | ICD-10-CM | POA: Diagnosis not present

## 2016-01-31 DIAGNOSIS — O2686 Pruritic urticarial papules and plaques of pregnancy (PUPPP): Secondary | ICD-10-CM | POA: Diagnosis not present

## 2016-01-31 DIAGNOSIS — F17210 Nicotine dependence, cigarettes, uncomplicated: Secondary | ICD-10-CM | POA: Diagnosis present

## 2016-01-31 DIAGNOSIS — O99824 Streptococcus B carrier state complicating childbirth: Secondary | ICD-10-CM | POA: Diagnosis present

## 2016-01-31 DIAGNOSIS — O99214 Obesity complicating childbirth: Secondary | ICD-10-CM | POA: Diagnosis present

## 2016-01-31 DIAGNOSIS — O99334 Smoking (tobacco) complicating childbirth: Secondary | ICD-10-CM | POA: Diagnosis present

## 2016-01-31 DIAGNOSIS — Z6841 Body Mass Index (BMI) 40.0 and over, adult: Secondary | ICD-10-CM

## 2016-01-31 DIAGNOSIS — Z8249 Family history of ischemic heart disease and other diseases of the circulatory system: Secondary | ICD-10-CM | POA: Diagnosis not present

## 2016-01-31 DIAGNOSIS — O1002 Pre-existing essential hypertension complicating childbirth: Secondary | ICD-10-CM | POA: Diagnosis present

## 2016-01-31 DIAGNOSIS — Z3A38 38 weeks gestation of pregnancy: Secondary | ICD-10-CM | POA: Diagnosis not present

## 2016-01-31 DIAGNOSIS — E10319 Type 1 diabetes mellitus with unspecified diabetic retinopathy without macular edema: Secondary | ICD-10-CM | POA: Diagnosis present

## 2016-01-31 DIAGNOSIS — O2402 Pre-existing diabetes mellitus, type 1, in childbirth: Secondary | ICD-10-CM | POA: Diagnosis present

## 2016-01-31 DIAGNOSIS — O99820 Streptococcus B carrier state complicating pregnancy: Secondary | ICD-10-CM

## 2016-01-31 DIAGNOSIS — O134 Gestational [pregnancy-induced] hypertension without significant proteinuria, complicating childbirth: Secondary | ICD-10-CM | POA: Diagnosis not present

## 2016-01-31 DIAGNOSIS — E109 Type 1 diabetes mellitus without complications: Secondary | ICD-10-CM | POA: Diagnosis not present

## 2016-01-31 DIAGNOSIS — O24013 Pre-existing diabetes mellitus, type 1, in pregnancy, third trimester: Secondary | ICD-10-CM

## 2016-01-31 DIAGNOSIS — I10 Essential (primary) hypertension: Secondary | ICD-10-CM

## 2016-01-31 LAB — CBC
HCT: 33.6 % — ABNORMAL LOW (ref 36.0–46.0)
HCT: 35.4 % — ABNORMAL LOW (ref 36.0–46.0)
Hemoglobin: 11.4 g/dL — ABNORMAL LOW (ref 12.0–15.0)
Hemoglobin: 11.8 g/dL — ABNORMAL LOW (ref 12.0–15.0)
MCH: 27.5 pg (ref 26.0–34.0)
MCH: 27.3 pg (ref 26.0–34.0)
MCHC: 33.9 g/dL (ref 30.0–36.0)
MCHC: 33.3 g/dL (ref 30.0–36.0)
MCV: 81 fL (ref 78.0–100.0)
MCV: 81.9 fL (ref 78.0–100.0)
Platelets: 339 10*3/uL (ref 150–400)
Platelets: 315 10*3/uL (ref 150–400)
RBC: 4.15 MIL/uL (ref 3.87–5.11)
RBC: 4.32 MIL/uL (ref 3.87–5.11)
RDW: 13.8 % (ref 11.5–15.5)
RDW: 13.8 % (ref 11.5–15.5)
WBC: 15.3 10*3/uL — ABNORMAL HIGH (ref 4.0–10.5)
WBC: 19.3 10*3/uL — ABNORMAL HIGH (ref 4.0–10.5)

## 2016-01-31 LAB — RPR: RPR Ser Ql: NONREACTIVE

## 2016-01-31 LAB — COMPREHENSIVE METABOLIC PANEL
ALT: 14 U/L (ref 14–54)
AST: 18 U/L (ref 15–41)
Albumin: 2.8 g/dL — ABNORMAL LOW (ref 3.5–5.0)
Alkaline Phosphatase: 184 U/L — ABNORMAL HIGH (ref 38–126)
Anion gap: 6 (ref 5–15)
BUN: 11 mg/dL (ref 6–20)
CO2: 23 mmol/L (ref 22–32)
Calcium: 8.7 mg/dL — ABNORMAL LOW (ref 8.9–10.3)
Chloride: 106 mmol/L (ref 101–111)
Creatinine, Ser: 0.44 mg/dL (ref 0.44–1.00)
GFR calc Af Amer: >60 mL/min (ref >60)
GFR calc non Af Amer: >60 mL/min (ref >60)
Glucose, Bld: 38 mg/dL — CL (ref 65–99)
Potassium: 4.1 mmol/L (ref 3.5–5.1)
Sodium: 135 mmol/L (ref 135–145)
Total Bilirubin: 0.5 mg/dL (ref 0.3–1.2)
Total Protein: 6.5 g/dL (ref 6.5–8.1)

## 2016-01-31 LAB — GLUCOSE, CAPILLARY
Glucose-Capillary: 40 mg/dL — CL (ref 65–99)
Glucose-Capillary: 49 mg/dL — ABNORMAL LOW (ref 65–99)
Glucose-Capillary: 107 mg/dL — ABNORMAL HIGH (ref 65–99)
Glucose-Capillary: 54 mg/dL — ABNORMAL LOW (ref 65–99)
Glucose-Capillary: 51 mg/dL — ABNORMAL LOW (ref 65–99)
Glucose-Capillary: 52 mg/dL — ABNORMAL LOW (ref 65–99)
Glucose-Capillary: 81 mg/dL (ref 65–99)
Glucose-Capillary: 141 mg/dL — ABNORMAL HIGH (ref 65–99)
Glucose-Capillary: 132 mg/dL — ABNORMAL HIGH (ref 65–99)

## 2016-01-31 LAB — TYPE AND SCREEN
ABO/RH(D): O POS
Antibody Screen: NEGATIVE

## 2016-01-31 LAB — ABO/RH: ABO/RH(D): O POS

## 2016-01-31 MED ORDER — LIDOCAINE HCL (PF) 1 % IJ SOLN: 30.0000 mL | INTRAMUSCULAR | Status: DC | PRN

## 2016-01-31 MED ORDER — ONDANSETRON HCL 4 MG/2ML IJ SOLN
4.0000 mg | Freq: Four times a day (QID) | INTRAMUSCULAR | Status: DC | PRN
  Administered 2016-01-31 – 2016-02-01 (×2): 4 mg via INTRAVENOUS
  Filled 2016-01-31 (×2): qty 2

## 2016-01-31 MED ORDER — DIPHENHYDRAMINE HCL 50 MG/ML IJ SOLN: 12.5000 mg | INTRAMUSCULAR | Status: DC | PRN

## 2016-01-31 MED ORDER — PENICILLIN G POT IN DEXTROSE 60000 UNIT/ML IV SOLN
3.0000 10*6.[IU] | INTRAVENOUS | Status: DC
  Administered 2016-01-31 – 2016-02-01 (×3): 3 10*6.[IU] via INTRAVENOUS
  Filled 2016-01-31 (×6): qty 50

## 2016-01-31 MED ORDER — FENTANYL 2.5 MCG/ML BUPIVACAINE 1/10 % EPIDURAL INFUSION (WH - ANES)
14.0000 mL/h | INTRAMUSCULAR | Status: DC | PRN
Start: 2016-01-31 — End: 2016-02-01
  Administered 2016-01-31 – 2016-02-01 (×3): 14 mL/h via EPIDURAL
  Filled 2016-01-31 (×2): qty 100

## 2016-01-31 MED ORDER — TERBUTALINE SULFATE 1 MG/ML IJ SOLN: 0.2500 mg | Freq: Once | INTRAMUSCULAR | Status: DC | PRN

## 2016-01-31 MED ORDER — SOD CITRATE-CITRIC ACID 500-334 MG/5ML PO SOLN
30.0000 mL | ORAL | Status: DC | PRN
  Administered 2016-02-01: 30 mL via ORAL
  Filled 2016-01-31: qty 15

## 2016-01-31 MED ORDER — OXYCODONE-ACETAMINOPHEN 5-325 MG PO TABS: 1.0000 | ORAL_TABLET | ORAL | Status: DC | PRN

## 2016-01-31 MED ORDER — OXYTOCIN 40 UNITS IN LACTATED RINGERS INFUSION - SIMPLE MED
1.0000 m[IU]/min | INTRAVENOUS | Status: DC
  Administered 2016-01-31: 2 m[IU]/min via INTRAVENOUS

## 2016-01-31 MED ORDER — LACTATED RINGERS IV SOLN: 500.0000 mL | INTRAVENOUS | Status: DC | PRN

## 2016-01-31 MED ORDER — ONDANSETRON HCL 4 MG/2ML IJ SOLN: 4.0000 mg | Freq: Four times a day (QID) | INTRAMUSCULAR | Status: DC | PRN

## 2016-01-31 MED ORDER — FLEET ENEMA 7-19 GM/118ML RE ENEM: 1.0000 | ENEMA | RECTAL | Status: DC | PRN

## 2016-01-31 MED ORDER — ACETAMINOPHEN 325 MG PO TABS: 650.0000 mg | ORAL_TABLET | ORAL | Status: DC | PRN

## 2016-01-31 MED ORDER — OXYTOCIN 40 UNITS IN LACTATED RINGERS INFUSION - SIMPLE MED
2.5000 [IU]/h | INTRAVENOUS | Status: DC
  Filled 2016-01-31: qty 1000

## 2016-01-31 MED ORDER — OXYCODONE-ACETAMINOPHEN 5-325 MG PO TABS: 2.0000 | ORAL_TABLET | ORAL | Status: DC | PRN

## 2016-01-31 MED ORDER — FENTANYL CITRATE (PF) 100 MCG/2ML IJ SOLN
100.0000 ug | INTRAMUSCULAR | Status: DC | PRN
  Administered 2016-01-31 (×2): 100 ug via INTRAVENOUS
  Filled 2016-01-31 (×2): qty 2

## 2016-01-31 MED ORDER — MISOPROSTOL 25 MCG QUARTER TABLET
25.0000 ug | ORAL_TABLET | ORAL | Status: DC | PRN
  Administered 2016-01-31: 25 ug via VAGINAL
  Filled 2016-01-31: qty 0.25

## 2016-01-31 MED ORDER — PENICILLIN G POTASSIUM 5000000 UNITS IJ SOLR
5.0000 10*6.[IU] | Freq: Once | INTRAVENOUS | Status: AC
  Administered 2016-01-31: 5 10*6.[IU] via INTRAVENOUS
  Filled 2016-01-31: qty 5

## 2016-01-31 MED ORDER — EPHEDRINE 5 MG/ML INJ: 10.0000 mg | INTRAVENOUS | Status: DC | PRN

## 2016-01-31 MED ORDER — OXYTOCIN 40 UNITS IN LACTATED RINGERS INFUSION - SIMPLE MED: 2.5000 [IU]/h | INTRAVENOUS | Status: DC

## 2016-01-31 MED ORDER — OXYTOCIN BOLUS FROM INFUSION: 500.0000 mL | Freq: Once | INTRAVENOUS | Status: DC

## 2016-01-31 MED ORDER — LACTATED RINGERS IV SOLN
INTRAVENOUS | Status: DC
  Administered 2016-01-31 (×2): via INTRAVENOUS

## 2016-01-31 MED ORDER — PHENYLEPHRINE 40 MCG/ML (10ML) SYRINGE FOR IV PUSH (FOR BLOOD PRESSURE SUPPORT): 80.0000 ug | PREFILLED_SYRINGE | INTRAVENOUS | Status: DC | PRN

## 2016-01-31 MED ORDER — PENICILLIN G POT IN DEXTROSE 60000 UNIT/ML IV SOLN
3.0000 10*6.[IU] | INTRAVENOUS | Status: DC
  Administered 2016-01-31: 3 10*6.[IU] via INTRAVENOUS
  Filled 2016-01-31 (×4): qty 50

## 2016-01-31 MED ORDER — PHENYLEPHRINE 40 MCG/ML (10ML) SYRINGE FOR IV PUSH (FOR BLOOD PRESSURE SUPPORT)
80.0000 ug | PREFILLED_SYRINGE | INTRAVENOUS | Status: DC | PRN
  Filled 2016-01-31: qty 10

## 2016-01-31 MED ORDER — LACTATED RINGERS IV SOLN
500.0000 mL | INTRAVENOUS | Status: DC | PRN
  Administered 2016-02-01: 1000 mL via INTRAVENOUS
  Administered 2016-02-01: 500 mL via INTRAVENOUS

## 2016-01-31 MED ORDER — SOD CITRATE-CITRIC ACID 500-334 MG/5ML PO SOLN: 30.0000 mL | ORAL | Status: DC | PRN

## 2016-01-31 MED ORDER — LIDOCAINE HCL (PF) 1 % IJ SOLN
INTRAMUSCULAR | Status: DC | PRN
  Administered 2016-01-31 (×2): 5 mL

## 2016-01-31 MED ORDER — MISOPROSTOL 25 MCG QUARTER TABLET
25.0000 ug | ORAL_TABLET | ORAL | Status: DC | PRN
  Administered 2016-01-31 (×2): 25 ug via VAGINAL
  Filled 2016-01-31 (×2): qty 0.25

## 2016-01-31 MED ORDER — LACTATED RINGERS IV SOLN
500.0000 mL | Freq: Once | INTRAVENOUS | Status: AC
  Administered 2016-01-31: 500 mL via INTRAVENOUS

## 2016-01-31 NOTE — Anesthesia Preprocedure Evaluation (Signed)
Anesthesia Evaluation  Patient identified by MRN, date of birth, ID band Patient awake    Reviewed: Allergy & Precautions, H&P , NPO status , Patient's Chart, lab work & pertinent test results  History of Anesthesia Complications Negative for: history of anesthetic complications  Airway Mallampati: II  TM Distance: >3 FB Neck ROM: full    Dental no notable dental hx. (+) Teeth Intact   Pulmonary neg pulmonary ROS, Current Smoker,    Pulmonary exam normal breath sounds clear to auscultation       Cardiovascular negative cardio ROS Normal cardiovascular exam Rhythm:regular Rate:Normal     Neuro/Psych negative neurological ROS  negative psych ROS   GI/Hepatic negative GI ROS, Neg liver ROS,   Endo/Other  negative endocrine ROSdiabetes, Type 1, Insulin DependentMorbid obesity  Renal/GU negative Renal ROS  negative genitourinary   Musculoskeletal   Abdominal   Peds  Hematology negative hematology ROS (+)   Anesthesia Other Findings   Reproductive/Obstetrics (+) Pregnancy                             Anesthesia Physical Anesthesia Plan  ASA: II  Anesthesia Plan: Epidural   Post-op Pain Management:    Induction:   Airway Management Planned:   Additional Equipment:   Intra-op Plan:   Post-operative Plan:   Informed Consent: I have reviewed the patients History and Physical, chart, labs and discussed the procedure including the risks, benefits and alternatives for the proposed anesthesia with the patient or authorized representative who has indicated his/her understanding and acceptance.     Plan Discussed with:   Anesthesia Plan Comments:         Anesthesia Quick Evaluation

## 2016-01-31 NOTE — Progress Notes (Signed)
Patient seen. Doing well Comfortable. Foley placed. Third cytotec placed. Continue with PRN fentanyl.

## 2016-01-31 NOTE — H&P (Signed)
LABOR AND DELIVERY ADMISSION HISTORY AND PHYSICAL NOTE  Molly Savage is a 26 y.o. female G1P0 with IUP at [redacted]w[redacted]d by ultrasound and history of type 1 diabetes presenting for IOL for cHTN and 6/10 BPP on day prior to admission.    Patient is on 110 units of levimir nightly in addition to novolog coverage with meals.  She reports most sugars less than 120 and about once or twice a week is larger than 200.  Patient does of chronic hypertension and is not on any medications regularly.   She reports positive fetal movement. She denies leakage of fluid or vaginal bleeding.  Prenatal History/Complications:  Past Medical History: Past Medical History:  Diagnosis Date  . Diabetes mellitus without complication (HCC)   . Tobacco abuse     Past Surgical History: Past Surgical History:  Procedure Laterality Date  . NO PAST SURGERIES      Obstetrical History: OB History    Gravida Para Term Preterm AB Living   1         0   SAB TAB Ectopic Multiple Live Births                  Social History: Social History   Social History  . Marital status: Significant Other    Spouse name: N/A  . Number of children: N/A  . Years of education: N/A   Social History Main Topics  . Smoking status: Current Every Day Smoker    Packs/day: 0.50-1.00     Years: 12.00    Types: Cigarettes  . Smokeless tobacco: Never Used  . Alcohol use No  . Drug use: No  . Sexual activity: Yes    Birth control/ protection: None   Other Topics Concern  . None   Social History Narrative  . None    Family History: Family History  Problem Relation Age of Onset  . Hypertension Mother   . Bipolar disorder Sister     Allergies: No Known Allergies  Prescriptions Prior to Admission  Medication Sig Dispense Refill Last Dose  . acetaminophen (TYLENOL) 500 MG tablet Take 2,000 mg by mouth every 6 (six) hours as needed for moderate pain.    01/28/2016  . insulin aspart (NOVOLOG) 100 UNIT/ML injection Inject  10-40 Units into the skin 3 (three) times daily before meals. Pt self adjusts her doses based on what she is eating.   01/31/2016 at Unknown time  . insulin detemir (LEVEMIR) 100 UNIT/ML injection Inject 1.1 mLs (110 Units total) into the skin at bedtime. Take 100 units of insulin at bedtime 3 vial 12 01/30/2016  . Insulin Syringe-Needle U-100 (SAFETY INSULIN SYRINGES) 27G X 1/2" 1 ML MISC 1 each by Does not apply route 4 (four) times daily. 100 each 5   . omeprazole (PRILOSEC) 20 MG capsule Take 1 capsule (20 mg total) by mouth 2 (two) times daily before a meal. 60 capsule 6 01/31/2016 at Unknown time  . Prenatal Vit-Fe Fumarate-FA (PRENATAL VITAMIN PO) Take 2 tablets by mouth daily.    01/30/2016    Review of Systems   All systems reviewed and negative except as stated in HPI  Blood pressure (!) 145/90, pulse 96, temperature 97.8 F (36.6 C), temperature source Oral, resp. rate 18, height 5\' 8"  (1.727 m), weight 131.1 kg (289 lb), last menstrual period 05/02/2015. General appearance: alert, cooperative, appears stated age and morbidly obese Lungs: clear to auscultation bilaterally Heart: regular rate and rhythm, no m/r/g, 2+ distal pulses bilaterally  Abdomen: soft, gravid, and non-tender Extremities: no calf tenderness or erythema, mild non-pitting edema in BLEs Presentation: vertex Fetal monitoring: intermittent tracing (monitor readjusted frequently), baseline 135 bpm, 6-25 bpm variability, 15x15 accels, no decels. Intermittent contractions.  Uterine activity: none    Dilation: 0, thick effacement, -3 station, vertex presentation   Prenatal labs: ABO, Rh: O/--/-- (06/05 0000) Antibody: Negative (06/05 0000) Rubella: immune RPR: NON REAC (10/16 1114)  HBsAg: Negative (06/05 0000)  HIV: NONREACTIVE (10/16 1114)  GBS: Positive (12/30 0000)  1 hr Glucola: type 1 DM (hgb A1c 6.1 on 11/30) Genetic screening: Quad--increased risk NTD--MFM consult-->normal spine on anatomy Anatomy US:  neg  Prenatal Transfer Tool  Maternal Diabetes: Yes:  Diabetes Type:  Pre-pregnancy, Insulin/Medication controlled Genetic Screening: Abnormal:  Results: Elevated AFP (4.23 per outside records) Maternal Ultrasounds/Referrals: Normal Fetal Ultrasounds or other Referrals:  Fetal echo, Referred to Materal Fetal Medicine (fetal echo normal) Maternal Substance Abuse:  Yes:  Type: Smoker Significant Maternal Medications:  Meds include: Other: insulin Significant Maternal Lab Results: GBS+; elevated AFP  Results for orders placed or performed during the hospital encounter of 01/31/16 (from the past 24 hour(s))  CBC   Collection Time: 01/31/16  8:25 AM  Result Value Ref Range   WBC 15.3 (H) 4.0 - 10.5 K/uL   RBC 4.15 3.87 - 5.11 MIL/uL   Hemoglobin 11.4 (L) 12.0 - 15.0 g/dL   HCT 19.133.6 (L) 47.836.0 - 29.546.0 %   MCV 81.0 78.0 - 100.0 fL   MCH 27.5 26.0 - 34.0 pg   MCHC 33.9 30.0 - 36.0 g/dL   RDW 62.113.8 30.811.5 - 65.715.5 %   Platelets 339 150 - 400 K/uL  Glucose, capillary   Collection Time: 01/31/16  8:34 AM  Result Value Ref Range   Glucose-Capillary 40 (LL) 65 - 99 mg/dL    Patient Active Problem List   Diagnosis Date Noted  . Group B Streptococcus carrier, +RV culture, currently pregnant 01/23/2016  . Chronic hypertension complicating pregnancy, antepartum 12/29/2015  . Lactose intolerance 12/26/2015  . Non-compliant pregnant patient, third trimester 12/21/2015  . Elevated AFP 12/21/2015  . BMI 40.0-44.9, adult (HCC) 09/18/2015  . Obesity in pregnancy 09/18/2015  . Supervision of high-risk pregnancy 09/02/2015  . Type 1 diabetes mellitus complicating pregnancy in third trimester, antepartum 09/02/2015  . Anxiety 07/30/2015  . Depression 07/30/2015  . Diabetic foot infection (HCC) 04/17/2015  . 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 Tobacco abuse   . Chronic hypertension 08/24/2014    Assessment: Molly Savage is a 26 y.o. G1P0 at 2964w4d here for IOL due [facility]  [facility]. Does have history of type 1 diabetes with last A1C 6.1 and appears to be well controlled. Had low blood sugar this morning to 40 on self check will reassess after she eats breakfast. Received call from RN that patient self administered 26U novolog.  Otherwise, she has no issues this morning.  #Labor: cytotec x 1 then transition to pitocin IOL with pitocin #Pain: IV pain medication; epidural #FWB: Category 1 #ID: penicillin G for GBS+ status #MOF: breast #MOC: undecided; OCPs in past and considering IUD #Circ: yes and will plan for outpatient #gHTN: PIH labs normal #DMI: Patient took levimir last night and covered herself in the hospital for breakfast, patient informed that she should not give herself insulin in the hospital. Patient has been hypogylcemic since administration. She is eating. continue CBG q 4 hours. If patient has BG that increases greater than 120 will start  iv insulin protocol.  Daniel L. Myrtie Soman, MD [facility] Resident PGY-1 01/31/2016 11:16 AM    OB FELLOW HISTORY AND PHYSICAL ATTESTATION  I have seen and examined this patient; I agree with above documentation in the resident's note.    Molly Savage 01/31/2016, 3:45 PM

## 2016-01-31 NOTE — Anesthesia Procedure Notes (Signed)
Epidural Patient location during procedure: OB  Staffing Anesthesiologist: Phillips GroutARIGNAN, Camryn Lampson Performed: anesthesiologist   Preanesthetic Checklist Completed: patient identified, site marked, surgical consent, pre-op evaluation, timeout performed, IV checked, risks and benefits discussed and monitors and equipment checked  Epidural Patient position: sitting Prep: DuraPrep Patient monitoring: heart rate, continuous pulse ox and blood pressure Approach: midline Location: L3-L4 Injection technique: LOR saline  Needle:  Needle type: Tuohy  Needle gauge: 17 G Needle length: 9 cm and 9 Needle insertion depth: 9 cm Catheter type: closed end flexible Catheter size: 20 Guage Catheter at skin depth: 14 cm Test dose: negative  Assessment Events: blood not aspirated, injection not painful, no injection resistance, negative IV test and no paresthesia  Additional Notes Patient identified. Risks/Benefits/Options discussed with patient including but not limited to bleeding, infection, nerve damage, paralysis, failed block, incomplete pain control, headache, blood pressure changes, nausea, vomiting, reactions to medication both or allergic, itching and postpartum back pain. Confirmed with bedside nurse the patient's most recent platelet count. Confirmed with patient that they are not currently taking any anticoagulation, have any bleeding history or any family history of bleeding disorders. Patient expressed understanding and wished to proceed. All questions were answered. Sterile technique was used throughout the entire procedure. Please see nursing notes for vital signs. Test dose was given through epidural needle and negative prior to continuing to dose epidural or start infusion. Warning signs of high block given to the patient including shortness of breath, tingling/numbness in hands, complete motor block, or any concerning symptoms with instructions to call for help. Patient was given instructions  on fall risk and not to get out of bed. All questions and concerns addressed with instructions to call with any issues.

## 2016-01-31 NOTE — Progress Notes (Signed)
When entering pt room, RN noticed empty insulin syringe on pt's bedside table.  Pt stated she had taken 26 units of novolog to cover her breakfast.  RN educated pt not to take her own medications and that we would manage all medications given to her.  Pt verbalized understandings.  Dr. Myrtie SomanWarden and Dr. Genevie AnnSchenk notified.

## 2016-01-31 NOTE — Progress Notes (Signed)
Interim progress note: late entry  Called by RN Luvenia Hellerhristina Woods at 0830 regarding patient's CBG of 40 and agreed with giving patient juice and rechecking after 30 min.  Repeat CBG was 49 at 0900. Patient denied any sweating or sweating and did not feel hypoglycemic during this period. Patient had apparently brought her own insulin and had given herself 30U novolog.  Given half life of novolog being ~80 min we decided to encourage patient to eat lunch and give her more juice and recheck.  CBG at 1115 was 107.  Will continue to follow.  Patient's mother in law had taken away her insulin. Discussed with patient that we do not allow patients to administer their own medication and she is aware of the dangers of hypoglycemia.   Cortez Steelman L. Myrtie SomanWarden, MD [facility] Resident PGY-1 01/31/2016 4:41 PM

## 2016-01-31 NOTE — Anesthesia Pain Management Evaluation Note (Signed)
  CRNA Pain Management Visit Note  Patient: Molly Savage, 26 y.o., female  "Hello I am a [facility] the [facility]. We have an anesthesia team available at all times to provide care throughout the hospital, including epidural management and anesthesia for C-section. I don't know your plan for the delivery whether it a natural birth, water birth, IV sedation, nitrous supplementation, doula or epidural, but we want to meet your pain goals."   1.Was your pain managed to your expectations on prior hospitalizations?   No prior hospitalizations  2.What is your expectation for pain management during this hospitalization?     Epidural, IV pain meds and Nitrous Oxide  3.How can we help you reach that goal? Be available  Record the patient's initial score and the patient's pain goal.   Pain: 2  Pain Goal: 5 [facility] wants you to be able to say your pain was always managed very well.  [facility],Holley Wirt 01/31/2016

## 2016-02-01 ENCOUNTER — Encounter (HOSPITAL_COMMUNITY): Payer: Self-pay

## 2016-02-01 ENCOUNTER — Encounter (HOSPITAL_COMMUNITY)
Admission: RE | Disposition: A | Discharge status: Home / Self Care | Payer: Self-pay | Source: Ambulatory Visit | Attending: Obstetrics and Gynecology

## 2016-02-01 ENCOUNTER — Other Ambulatory Visit: Payer: Self-pay | Admitting: Family Medicine

## 2016-02-01 DIAGNOSIS — E109 Type 1 diabetes mellitus without complications: Secondary | ICD-10-CM

## 2016-02-01 DIAGNOSIS — O134 Gestational [pregnancy-induced] hypertension without significant proteinuria, complicating childbirth: Secondary | ICD-10-CM

## 2016-02-01 DIAGNOSIS — Z3A38 38 weeks gestation of pregnancy: Secondary | ICD-10-CM

## 2016-02-01 DIAGNOSIS — O2402 Pre-existing diabetes mellitus, type 1, in childbirth: Secondary | ICD-10-CM

## 2016-02-01 DIAGNOSIS — O99824 Streptococcus B carrier state complicating childbirth: Secondary | ICD-10-CM

## 2016-02-01 LAB — GLUCOSE, CAPILLARY
Glucose-Capillary: 159 mg/dL — ABNORMAL HIGH (ref 65–99)
Glucose-Capillary: 209 mg/dL — ABNORMAL HIGH (ref 65–99)
Glucose-Capillary: 233 mg/dL — ABNORMAL HIGH (ref 65–99)
Glucose-Capillary: 220 mg/dL — ABNORMAL HIGH (ref 65–99)
Glucose-Capillary: 180 mg/dL — ABNORMAL HIGH (ref 65–99)
Glucose-Capillary: 180 mg/dL — ABNORMAL HIGH (ref 65–99)
Glucose-Capillary: 100 mg/dL — ABNORMAL HIGH (ref 65–99)
Glucose-Capillary: 86 mg/dL (ref 65–99)

## 2016-02-01 SURGERY — Surgical Case
Anesthesia: Epidural

## 2016-02-01 MED ORDER — DEXTROSE 5 % IV SOLN
INTRAVENOUS | Status: DC | PRN
  Administered 2016-02-01: 3 g via INTRAVENOUS

## 2016-02-01 MED ORDER — PROMETHAZINE HCL 25 MG/ML IJ SOLN: 6.2500 mg | INTRAMUSCULAR | Status: DC | PRN

## 2016-02-01 MED ORDER — SCOPOLAMINE 1 MG/3DAYS TD PT72
MEDICATED_PATCH | TRANSDERMAL | Status: DC | PRN
  Administered 2016-02-01: 1 via TRANSDERMAL

## 2016-02-01 MED ORDER — DIPHENHYDRAMINE HCL 50 MG/ML IJ SOLN: 12.5000 mg | INTRAMUSCULAR | Status: DC | PRN

## 2016-02-01 MED ORDER — PRENATAL MULTIVITAMIN CH
1.0000 | ORAL_TABLET | Freq: Every day | ORAL | Status: DC
  Administered 2016-02-02 – 2016-02-03 (×2): 1 via ORAL
  Filled 2016-02-01 (×2): qty 1

## 2016-02-01 MED ORDER — KETOROLAC TROMETHAMINE 30 MG/ML IJ SOLN
INTRAMUSCULAR | Status: AC
Start: 2016-02-01 — End: 2016-02-01
  Administered 2016-02-01: 30 mg via INTRAMUSCULAR
  Filled 2016-02-01: qty 1

## 2016-02-01 MED ORDER — LACTATED RINGERS IV SOLN: INTRAVENOUS | Status: DC

## 2016-02-01 MED ORDER — INSULIN ASPART 100 UNIT/ML ~~LOC~~ SOLN
5.0000 [IU] | Freq: Once | SUBCUTANEOUS | Status: AC
Start: 2016-02-01 — End: 2016-02-01
  Administered 2016-02-01: 5 [IU] via SUBCUTANEOUS

## 2016-02-01 MED ORDER — IBUPROFEN 600 MG PO TABS
600.0000 mg | ORAL_TABLET | Freq: Four times a day (QID) | ORAL | Status: DC
  Administered 2016-02-01 – 2016-02-03 (×7): 600 mg via ORAL
  Filled 2016-02-01 (×7): qty 1

## 2016-02-01 MED ORDER — INSULIN DETEMIR 100 UNIT/ML ~~LOC~~ SOLN
70.0000 [IU] | Freq: Every day | SUBCUTANEOUS | Status: DC
  Filled 2016-02-01 (×2): qty 0.7

## 2016-02-01 MED ORDER — DEXTROSE 5 % IV SOLN
1.0000 ug/kg/h | INTRAVENOUS | Status: DC | PRN
  Filled 2016-02-01: qty 2

## 2016-02-01 MED ORDER — OXYTOCIN 10 UNIT/ML IJ SOLN
INTRAMUSCULAR | Status: AC
  Filled 2016-02-01: qty 4

## 2016-02-01 MED ORDER — MEPERIDINE HCL 25 MG/ML IJ SOLN: 6.2500 mg | INTRAMUSCULAR | Status: DC | PRN

## 2016-02-01 MED ORDER — WITCH HAZEL-GLYCERIN EX PADS: 1.0000 "application " | MEDICATED_PAD | CUTANEOUS | Status: DC | PRN

## 2016-02-01 MED ORDER — MORPHINE SULFATE (PF) 0.5 MG/ML IJ SOLN
INTRAMUSCULAR | Status: DC | PRN
Start: 2016-02-01 — End: 2016-02-01
  Administered 2016-02-01: 1 mg via INTRAVENOUS
  Administered 2016-02-01: 4 mg via EPIDURAL

## 2016-02-01 MED ORDER — MENTHOL 3 MG MT LOZG: 1.0000 | LOZENGE | OROMUCOSAL | Status: DC | PRN

## 2016-02-01 MED ORDER — NALBUPHINE HCL 10 MG/ML IJ SOLN: 5.0000 mg | Freq: Once | INTRAMUSCULAR | Status: DC | PRN

## 2016-02-01 MED ORDER — LACTATED RINGERS IV SOLN
INTRAVENOUS | Status: DC
  Administered 2016-02-01: 17:00:00 via INTRAVENOUS

## 2016-02-01 MED ORDER — SCOPOLAMINE 1 MG/3DAYS TD PT72: 1.0000 | MEDICATED_PATCH | Freq: Once | TRANSDERMAL | Status: DC

## 2016-02-01 MED ORDER — OXYCODONE HCL 5 MG PO TABS
5.0000 mg | ORAL_TABLET | ORAL | Status: DC | PRN
  Administered 2016-02-02 – 2016-02-03 (×4): 5 mg via ORAL
  Filled 2016-02-01 (×4): qty 1

## 2016-02-01 MED ORDER — OXYCODONE HCL 5 MG PO TABS
10.0000 mg | ORAL_TABLET | ORAL | Status: DC | PRN
  Administered 2016-02-02 – 2016-02-03 (×6): 10 mg via ORAL
  Filled 2016-02-01 (×6): qty 2

## 2016-02-01 MED ORDER — FENTANYL CITRATE (PF) 100 MCG/2ML IJ SOLN
INTRAMUSCULAR | Status: DC | PRN
  Administered 2016-02-01 (×2): 50 ug via INTRAVENOUS

## 2016-02-01 MED ORDER — FENTANYL CITRATE (PF) 100 MCG/2ML IJ SOLN
INTRAMUSCULAR | Status: AC
  Filled 2016-02-01: qty 2

## 2016-02-01 MED ORDER — SCOPOLAMINE 1 MG/3DAYS TD PT72
MEDICATED_PATCH | TRANSDERMAL | Status: AC
  Filled 2016-02-01: qty 1

## 2016-02-01 MED ORDER — KETOROLAC TROMETHAMINE 30 MG/ML IJ SOLN
30.0000 mg | Freq: Four times a day (QID) | INTRAMUSCULAR | Status: AC | PRN
  Administered 2016-02-01: 30 mg via INTRAVENOUS
  Filled 2016-02-01: qty 1

## 2016-02-01 MED ORDER — ONDANSETRON HCL 4 MG/2ML IJ SOLN
INTRAMUSCULAR | Status: AC
  Filled 2016-02-01: qty 4

## 2016-02-01 MED ORDER — INSULIN DETEMIR 100 UNIT/ML ~~LOC~~ SOLN
70.0000 [IU] | Freq: Every day | SUBCUTANEOUS | Status: DC
  Administered 2016-02-01: 70 [IU] via SUBCUTANEOUS
  Filled 2016-02-01: qty 0.7

## 2016-02-01 MED ORDER — SODIUM CHLORIDE 0.9 % IR SOLN
Status: DC | PRN
  Administered 2016-02-01: 1

## 2016-02-01 MED ORDER — TETANUS-DIPHTH-ACELL PERTUSSIS 5-2.5-18.5 LF-MCG/0.5 IM SUSP: 0.5000 mL | Freq: Once | INTRAMUSCULAR | Status: DC

## 2016-02-01 MED ORDER — DIBUCAINE 1 % RE OINT: 1.0000 "application " | TOPICAL_OINTMENT | RECTAL | Status: DC | PRN

## 2016-02-01 MED ORDER — OXYTOCIN 10 UNIT/ML IJ SOLN
INTRAMUSCULAR | Status: DC | PRN
  Administered 2016-02-01: 40 [IU] via INTRAVENOUS

## 2016-02-01 MED ORDER — NALOXONE HCL 0.4 MG/ML IJ SOLN: 0.4000 mg | INTRAMUSCULAR | Status: DC | PRN

## 2016-02-01 MED ORDER — SIMETHICONE 80 MG PO CHEW
80.0000 mg | CHEWABLE_TABLET | ORAL | Status: DC
  Administered 2016-02-01 – 2016-02-02 (×2): 80 mg via ORAL
  Filled 2016-02-01 (×2): qty 1

## 2016-02-01 MED ORDER — ACETAMINOPHEN 325 MG PO TABS
650.0000 mg | ORAL_TABLET | ORAL | Status: DC | PRN
  Administered 2016-02-02: 650 mg via ORAL
  Filled 2016-02-01: qty 2

## 2016-02-01 MED ORDER — SIMETHICONE 80 MG PO CHEW
80.0000 mg | CHEWABLE_TABLET | ORAL | Status: DC | PRN
  Administered 2016-02-02: 80 mg via ORAL

## 2016-02-01 MED ORDER — DIPHENHYDRAMINE HCL 25 MG PO CAPS
25.0000 mg | ORAL_CAPSULE | ORAL | Status: DC | PRN
  Filled 2016-02-01: qty 1

## 2016-02-01 MED ORDER — INSULIN ASPART 100 UNIT/ML ~~LOC~~ SOLN
6.0000 [IU] | Freq: Three times a day (TID) | SUBCUTANEOUS | Status: DC
  Administered 2016-02-01 – 2016-02-03 (×4): 6 [IU] via SUBCUTANEOUS

## 2016-02-01 MED ORDER — ZOLPIDEM TARTRATE 5 MG PO TABS: 5.0000 mg | ORAL_TABLET | Freq: Every evening | ORAL | Status: DC | PRN

## 2016-02-01 MED ORDER — NALBUPHINE HCL 10 MG/ML IJ SOLN
5.0000 mg | INTRAMUSCULAR | Status: DC | PRN
  Filled 2016-02-01: qty 1

## 2016-02-01 MED ORDER — DIPHENHYDRAMINE HCL 25 MG PO CAPS: 25.0000 mg | ORAL_CAPSULE | Freq: Four times a day (QID) | ORAL | Status: DC | PRN

## 2016-02-01 MED ORDER — LACTATED RINGERS IV SOLN
INTRAVENOUS | Status: DC | PRN
  Administered 2016-02-01 (×2): via INTRAVENOUS

## 2016-02-01 MED ORDER — LIDOCAINE-EPINEPHRINE (PF) 2 %-1:200000 IJ SOLN
INTRAMUSCULAR | Status: AC
  Filled 2016-02-01: qty 20

## 2016-02-01 MED ORDER — SENNOSIDES-DOCUSATE SODIUM 8.6-50 MG PO TABS
2.0000 | ORAL_TABLET | ORAL | Status: DC
  Administered 2016-02-01 – 2016-02-02 (×2): 2 via ORAL
  Filled 2016-02-01 (×2): qty 2

## 2016-02-01 MED ORDER — MORPHINE SULFATE (PF) 0.5 MG/ML IJ SOLN
INTRAMUSCULAR | Status: AC
  Filled 2016-02-01: qty 10

## 2016-02-01 MED ORDER — ONDANSETRON HCL 4 MG/2ML IJ SOLN
4.0000 mg | Freq: Three times a day (TID) | INTRAMUSCULAR | Status: DC | PRN
  Administered 2016-02-01: 4 mg via INTRAVENOUS
  Filled 2016-02-01: qty 2

## 2016-02-01 MED ORDER — KETOROLAC TROMETHAMINE 30 MG/ML IJ SOLN
30.0000 mg | Freq: Four times a day (QID) | INTRAMUSCULAR | Status: AC | PRN
  Administered 2016-02-01: 30 mg via INTRAMUSCULAR

## 2016-02-01 MED ORDER — INSULIN ASPART 100 UNIT/ML ~~LOC~~ SOLN
0.0000 [IU] | Freq: Three times a day (TID) | SUBCUTANEOUS | Status: DC
  Administered 2016-02-01 – 2016-02-02 (×2): 4 [IU] via SUBCUTANEOUS
  Administered 2016-02-03: 7 [IU] via SUBCUTANEOUS
  Administered 2016-02-03: 4 [IU] via SUBCUTANEOUS

## 2016-02-01 MED ORDER — DEXTROSE 5 % IV SOLN
INTRAVENOUS | Status: AC
  Filled 2016-02-01: qty 3000

## 2016-02-01 MED ORDER — COCONUT OIL OIL: 1.0000 "application " | TOPICAL_OIL | Status: DC | PRN

## 2016-02-01 MED ORDER — SODIUM CHLORIDE 0.9% FLUSH: 3.0000 mL | INTRAVENOUS | Status: DC | PRN

## 2016-02-01 MED ORDER — OXYTOCIN 40 UNITS IN LACTATED RINGERS INFUSION - SIMPLE MED: 2.5000 [IU]/h | INTRAVENOUS | Status: AC

## 2016-02-01 MED ORDER — SODIUM BICARBONATE 8.4 % IV SOLN
INTRAVENOUS | Status: DC | PRN
  Administered 2016-02-01 (×2): 5 mL via EPIDURAL

## 2016-02-01 MED ORDER — INSULIN ASPART 100 UNIT/ML ~~LOC~~ SOLN: 10.0000 [IU] | Freq: Three times a day (TID) | SUBCUTANEOUS | Status: DC

## 2016-02-01 MED ORDER — FENTANYL CITRATE (PF) 100 MCG/2ML IJ SOLN: 25.0000 ug | INTRAMUSCULAR | Status: DC | PRN

## 2016-02-01 MED ORDER — SIMETHICONE 80 MG PO CHEW
80.0000 mg | CHEWABLE_TABLET | Freq: Three times a day (TID) | ORAL | Status: DC
  Administered 2016-02-02 – 2016-02-03 (×3): 80 mg via ORAL
  Filled 2016-02-01 (×4): qty 1

## 2016-02-01 MED ORDER — PANTOPRAZOLE SODIUM 20 MG PO TBEC
20.0000 mg | DELAYED_RELEASE_TABLET | Freq: Every day | ORAL | Status: DC
  Administered 2016-02-01 – 2016-02-03 (×3): 20 mg via ORAL
  Filled 2016-02-01 (×4): qty 1

## 2016-02-01 MED ORDER — NALBUPHINE HCL 10 MG/ML IJ SOLN
5.0000 mg | INTRAMUSCULAR | Status: DC | PRN
  Administered 2016-02-01 – 2016-02-02 (×2): 5 mg via SUBCUTANEOUS
  Filled 2016-02-01: qty 1

## 2016-02-01 MED ORDER — ONDANSETRON HCL 4 MG/2ML IJ SOLN
INTRAMUSCULAR | Status: DC | PRN
  Administered 2016-02-01: 4 mg via INTRAVENOUS

## 2016-02-01 SURGICAL SUPPLY — 30 items
BENZOIN TINCTURE PRP APPL 2/3 (GAUZE/BANDAGES/DRESSINGS) ×2 IMPLANT
CHLORAPREP W/TINT 26ML (MISCELLANEOUS) ×2 IMPLANT
CLAMP CORD UMBIL (MISCELLANEOUS) IMPLANT
CLOSURE STERI STRIP 1/2 X4 (GAUZE/BANDAGES/DRESSINGS) ×2 IMPLANT
CONTAINER PREFILL 10% NBF 15ML (MISCELLANEOUS) IMPLANT
DRESSING DISP NPWT PICO 4X12 (MISCELLANEOUS) ×2 IMPLANT
DRSG OPSITE POSTOP 4X10 (GAUZE/BANDAGES/DRESSINGS) ×2 IMPLANT
ELECT REM PT RETURN 9FT ADLT (ELECTROSURGICAL) ×2
ELECTRODE REM PT RTRN 9FT ADLT (ELECTROSURGICAL) ×1 IMPLANT
EXTRACTOR VACUUM M CUP 4 TUBE (SUCTIONS) IMPLANT
GLOVE BIOGEL PI IND STRL 6.5 (GLOVE) ×1 IMPLANT
GLOVE BIOGEL PI IND STRL 7.0 (GLOVE) ×1 IMPLANT
GLOVE BIOGEL PI INDICATOR 6.5 (GLOVE) ×1
GLOVE BIOGEL PI INDICATOR 7.0 (GLOVE) ×1
GLOVE SURG SS PI 6.0 STRL IVOR (GLOVE) ×2 IMPLANT
GOWN STRL REUS W/TWL LRG LVL3 (GOWN DISPOSABLE) ×4 IMPLANT
KIT ABG SYR 3ML LUER SLIP (SYRINGE) IMPLANT
NEEDLE HYPO 25X5/8 SAFETYGLIDE (NEEDLE) IMPLANT
NS IRRIG 1000ML POUR BTL (IV SOLUTION) ×2 IMPLANT
PACK C SECTION WH (CUSTOM PROCEDURE TRAY) ×2 IMPLANT
PAD OB MATERNITY 4.3X12.25 (PERSONAL CARE ITEMS) ×2 IMPLANT
PENCIL SMOKE EVAC W/HOLSTER (ELECTROSURGICAL) ×2 IMPLANT
RTRCTR C-SECT PINK 25CM LRG (MISCELLANEOUS) IMPLANT
SEPRAFILM MEMBRANE 5X6 (MISCELLANEOUS) IMPLANT
SUT PLAIN 0 NONE (SUTURE) IMPLANT
SUT PLAIN 2 0 XLH (SUTURE) ×4 IMPLANT
SUT VIC AB 0 CT1 36 (SUTURE) ×10 IMPLANT
SUT VIC AB 4-0 KS 27 (SUTURE) ×2 IMPLANT
TOWEL OR 17X24 6PK STRL BLUE (TOWEL DISPOSABLE) ×2 IMPLANT
TRAY FOLEY CATH SILVER 14FR (SET/KITS/TRAYS/PACK) ×2 IMPLANT

## 2016-02-01 NOTE — Addendum Note (Signed)
Addendum  created 02/01/16 1535 by Shanon PayorSuzanne M Sonora Catlin, CRNA   Sign clinical note

## 2016-02-01 NOTE — Transfer of Care (Signed)
Immediate Anesthesia Transfer of Care Note  Patient: Molly BloomSusan Mckendree  Procedure(s) Performed: Procedure(s): CESAREAN SECTION (N/A)  Patient Location: PACU  Anesthesia Type:Epidural  Level of Consciousness: awake, alert  and oriented  Airway & Oxygen Therapy: Patient Spontanous Breathing  Post-op Assessment: Report given to RN and Post -op Vital signs reviewed and stable  Post vital signs: Reviewed and stable BP 167/74, RR 18, HR 84, Sao2 98%  Last Vitals:  Vitals:   02/01/16 0601 02/01/16 0631  BP: (!) 142/80 (!) 142/70  Pulse: 81 87  Resp:  16  Temp: 37 C     Last Pain:  Vitals:   02/01/16 0601  TempSrc: Oral  PainSc:       Patients Stated Pain Goal: 0 (01/31/16 2147)  Complications: No apparent anesthesia complications

## 2016-02-01 NOTE — Progress Notes (Signed)
Patient ID: [name], female   DOB: 1990/07/04, 26 y.o.   MRN: [id number]  Appears comfortable w/ epidural; seems to be sleeping; FB out  BPs 150/80s, other VSS CBG 130 FHR 120s, +accels, occ decels to 70-80s x 50sec Difficult to trace ctx; Pit @ 1072mu/min Cx deferred at present  IUP@38 .5wks cHTN DM-I  Continue running Pitocin Watch CBGs and start Glucostabilizer prn  SHAW, KIMBERLY CNM 02/01/2016 3:00 AM

## 2016-02-01 NOTE — Anesthesia Postprocedure Evaluation (Signed)
Anesthesia Post Note  Patient: Dorna BloomSusan Towson  Procedure(s) Performed: Procedure(s) (LRB): CESAREAN SECTION (N/A)  Patient location during evaluation: PACU Anesthesia Type: Epidural Level of consciousness: oriented and awake and alert Pain management: pain level controlled Vital Signs Assessment: post-procedure vital signs reviewed and stable Respiratory status: spontaneous breathing, respiratory function stable and patient connected to nasal cannula oxygen Cardiovascular status: blood pressure returned to baseline and stable Postop Assessment: no headache, no backache and epidural receding Anesthetic complications: no        Last Vitals:  Vitals:   02/01/16 0631 02/01/16 0825  BP: (!) 142/70   Pulse: 87   Resp: 16   Temp:  36.6 C    Last Pain:  Vitals:   02/01/16 0825  TempSrc: Oral  PainSc: 3    Pain Goal: Patients Stated Pain Goal: 0 (01/31/16 2147)               Shelton SilvasKevin D Hollis

## 2016-02-01 NOTE — Lactation Note (Signed)
This note was copied from a baby's chart. Lactation Consultation Note  Patient Name: Boy Dorna BloomSusan Curb YNWGN'FToday's Date: 02/01/2016 Reason for consult: Initial assessment Baby at 11 hr of life. Mom reports that baby is not latching well and she does not think she has enough milk because baby's blood sugar has been low. Discussed baby behavior, feeding frequency, baby belly size, voids, wt loss, breast changes, and nipple care. Mom declined manual expression demonstration because she is nauseous. [facility] and [facility] mom began to throw up. Left handouts and lactation phone number. She will call at the next feeding for latch help.    Maternal Data Formula Feeding for Exclusion: No Has patient been taught Hand Expression?: Yes Does the patient have breastfeeding experience prior to this delivery?: No  Feeding Feeding Type: Bottle Fed - Formula Nipple Type: Regular Length of feed: 5 min  LATCH Score/Interventions                      Lactation Tools Discussed/Used WIC Program: Yes   Consult Status Consult Status: Follow-up Date: 02/02/16 Follow-up type: In-patient    Rulon Eisenmengerlizabeth E Hanako Tipping 02/01/2016, 6:30 PM

## 2016-02-01 NOTE — Op Note (Signed)
Molly BloomSusan Anastos PROCEDURE DATE: 01/31/2016 - 02/01/2016  PREOPERATIVE DIAGNOSIS: Intrauterine pregnancy at  2854w5d weeks gestation; non-reassuring fetal status  POSTOPERATIVE DIAGNOSIS: The same  PROCEDURE:     Cesarean Section  SURGEON:  Dr. Catalina AntiguaPeggy Sayaka Hoeppner  ASSISTANT: none  INDICATIONS: Molly Savage is a 26 y.o. G1P1001 at 4754w5d scheduled for cesarean section secondary to non-reassuring fetal status.  The risks of cesarean section discussed with the patient included but were not limited to: bleeding which may require transfusion or reoperation; infection which may require antibiotics; injury to bowel, bladder, ureters or other surrounding organs; injury to the fetus; need for additional procedures including hysterectomy in the event of a life-threatening hemorrhage; placental abnormalities wth subsequent pregnancies, incisional problems, thromboembolic phenomenon and other postoperative/anesthesia complications. The patient concurred with the proposed plan, giving informed written consent for the procedure.    FINDINGS:  Viable female infant in cephalic presentation.  Apgars 8 and 9.  Clear amniotic fluid.  Intact placenta, three vessel cord.  Normal uterus, fallopian tubes and ovaries bilaterally.  ANESTHESIA:    Spinal INTRAVENOUS FLUIDS:1500 ml ESTIMATED BLOOD LOSS: 800ml URINE OUTPUT:  150 ml SPECIMENS: Placenta sent to pathology COMPLICATIONS: None immediate  PROCEDURE IN DETAIL:  The patient received intravenous antibiotics and had sequential compression devices applied to her lower extremities while in the preoperative area.  She was then taken to the operating room where anesthesia was induced and was found to be adequate. A foley catheter was placed into her bladder and attached to Nihira Puello gravity. She was then placed in a dorsal supine position with a leftward tilt, and prepped and draped in a sterile manner. After an adequate timeout was performed, a Pfannenstiel skin incision  was made with scalpel and carried through to the underlying layer of fascia. The fascia was incised in the midline and this incision was extended bilaterally using the Mayo scissors. Kocher clamps were applied to the superior aspect of the fascial incision and the underlying rectus muscles were dissected off bluntly. A similar process was carried out on the inferior aspect of the facial incision. The rectus muscles were separated in the midline bluntly and the peritoneum was entered bluntly. The Alexis self-retaining retractor was introduced into the abdominal cavity. Attention was turned to the lower uterine segment where a transverse hysterotomy was made with a scalpel and extended bilaterally bluntly. The infant was successfully delivered, and cord was clamped and cut and infant was handed over to awaiting neonatology team. Uterine massage was then administered and the placenta delivered intact with three-vessel cord. The uterus was cleared of clot and debris.  The hysterotomy was closed with 0 Vicryl in a running locked fashion, and an imbricating layer was also placed with a 0 Vicryl. Overall, excellent hemostasis was noted. The pelvis copiously irrigated and cleared of all clot and debris. Hemostasis was confirmed on all surfaces.  The peritoneum and the muscles were reapproximated using 0 vicryl interrupted stitches. The fascia was then closed using 0 Vicryl in a running fashion.  The subcutaneous layer was reapproximated with plain gut and the skin was closed in a subcuticular fashion using 3.0 Vicryl. The patient tolerated the procedure well. Sponge, lap, instrument and needle counts were correct x 2. She was taken to the recovery room in stable condition.    Troyce Gieske ConstantMD  02/01/2016 8:03 AM

## 2016-02-01 NOTE — Progress Notes (Signed)
Patient is doing well and comfortable with epidural.   Blood pressure (!) 142/80, pulse 81, temperature 98.6 F (37 C), temperature source Oral, resp. rate 16, height 5\' 8"  (1.727 m), weight 289 lb (131.1 kg), last menstrual period 05/02/2015. GENERAL: Well-developed, well-nourished female in no acute distress.  ABDOMEN: Soft, nontender, gravid PELVIC: Not examined EXTREMITIES: No cyanosis, clubbing, or edema, 2+ distal pulses.  FHT: baseline 150, min-mod variability, + late decels, no accels Toco: ctx q 2- 5 minutes  A/P 26 yo G1P0 at 4038w5d with CHTN and type 1 DM here for IOL - Fetal status change reviewed with the patient with minimal improvement with amnioinfusion - Discussed delivery via cesarean section given that the patient is remote from delivery. Risks, benefits and alternatives were reviewed with the patient including but not limited to risks of bleeding, infection and damage to adjacent organs. Patient verbalized understanding and all questions were answered.

## 2016-02-01 NOTE — Progress Notes (Signed)
Patient ID: Molly Savage, female   DOB: 9/11/[name]1992, 26 y.o.   MRN: [id number]  Comfortable w/ epidural  BPs 136/73, 126/79, 139/85, other VSS FHR 150s, +accels, occ variables/decels- unable to tell if late at times? Ctx irreg w/ Pit @ 282mu/min Cx 4/70/0-2; AROM for copious clear fluid  CBG: 159  IUP@term  DM1 cHTN  Internal monitors placed Will increase Pit to adequate as FHR tolerates Will begin [employer] Insurancelucostabilizer  SHAW, KIMBERLY CNM 02/01/2016 5:33 AM

## 2016-02-01 NOTE — Progress Notes (Signed)
Inpatient Diabetes Program Recommendations  AACE/ADA: New Consensus Statement on Inpatient Glycemic Control (2015)  Target Ranges:  Prepandial:   less than 140 mg/dL      Peak postprandial:   less than 180 mg/dL (1-2 hours)      Critically ill patients:  140 - 180 mg/dL   Lab Results  Component Value Date   GLUCAP 220 (H) 02/01/2016   HGBA1C 6.1 (H) 12/21/2015    Review of Glycemic Control  Results for Molly Savage, [place] (MRN [id number]) as of 02/01/2016 11:13  Ref. Range 01/31/2016 19:46 02/01/2016 03:32 02/01/2016 06:41 02/01/2016 08:46 02/01/2016 10:31  Glucose-Capillary Latest Ref Range: 65 - 99 mg/dL 956132 (H) 213159 (H) 086209 (H) 233 (H) 220 (H)    Diabetes history: Type 2 Outpatient Diabetes medications: Levemir insulin 110units qhs, Novolog 10-40 units tid pre-meals   Current orders for Inpatient glycemic control: Levemir insulin 110units qhs, Novolog 10-40 units tid pre-meals  Inpatient Diabetes Program Recommendations:  MD- Please specify Novolog insulin to carb ratio on the orders.  On 12/07/15 she was told to use "a Insulin to Carb Ratio (ICR) of 1 unit / 2.5 grams carb (= 6 units/carb choice)"- consider adding this statement to her current order for Novolog insulin.  Consider ordering Novolog moderate correction scale 0-15 units tid and Novolog 0-5 units qhs- this is in addition to the current mealtime insulin order and the Levemir insulin orders.   Susette RacerJulie Mayson Mcneish, RN, BA, MHA, CDE Diabetes Coordinator Inpatient Diabetes Program  [phone] (Team Pager) [phone] [facility] Office) 02/01/2016 11:21 AM

## 2016-02-01 NOTE — Anesthesia Postprocedure Evaluation (Addendum)
Anesthesia Post Note  Patient: Molly Savage  Procedure(s) Performed: Procedure(s) (LRB): CESAREAN SECTION (N/A)  Patient location during evaluation: Mother Baby Anesthesia Type: Epidural Level of consciousness: awake and alert and oriented Pain management: pain level controlled Vital Signs Assessment: post-procedure vital signs reviewed and stable Respiratory status: spontaneous breathing and nonlabored ventilation Cardiovascular status: stable Postop Assessment: no headache, no backache, patient able to bend at knees and adequate PO intake Anesthetic complications: no Comments: Patient nauseous and vomiting, nurse aware, zofran given.        Last Vitals:  Vitals:   02/01/16 1200 02/01/16 1300  BP: 138/73 124/65  Pulse: 86 72  Resp: 18 18  Temp: 36.6 C 36.7 C    Last Pain:  Vitals:   02/01/16 1300  TempSrc: Oral  PainSc: 3    Pain Goal: Patients Stated Pain Goal: 0 (01/31/16 2147)               Madison HickmanGREGORY,SUZANNE

## 2016-02-02 LAB — CBC
HCT: 23.5 % — ABNORMAL LOW (ref 36.0–46.0)
Hemoglobin: 8.1 g/dL — ABNORMAL LOW (ref 12.0–15.0)
MCH: 28.1 pg (ref 26.0–34.0)
MCHC: 34.5 g/dL (ref 30.0–36.0)
MCV: 81.6 fL (ref 78.0–100.0)
Platelets: 221 10*3/uL (ref 150–400)
RBC: 2.88 MIL/uL — ABNORMAL LOW (ref 3.87–5.11)
RDW: 14.1 % (ref 11.5–15.5)
WBC: 12.3 10*3/uL — ABNORMAL HIGH (ref 4.0–10.5)

## 2016-02-02 LAB — GLUCOSE, CAPILLARY
Glucose-Capillary: 162 mg/dL — ABNORMAL HIGH (ref 65–99)
Glucose-Capillary: 52 mg/dL — ABNORMAL LOW (ref 65–99)
Glucose-Capillary: 49 mg/dL — ABNORMAL LOW (ref 65–99)
Glucose-Capillary: 65 mg/dL (ref 65–99)
Glucose-Capillary: 42 mg/dL — CL (ref 65–99)
Glucose-Capillary: 59 mg/dL — ABNORMAL LOW (ref 65–99)
Glucose-Capillary: 70 mg/dL (ref 65–99)
Glucose-Capillary: 165 mg/dL — ABNORMAL HIGH (ref 65–99)
Glucose-Capillary: 154 mg/dL — ABNORMAL HIGH (ref 65–99)
Glucose-Capillary: 137 mg/dL — ABNORMAL HIGH (ref 65–99)

## 2016-02-02 MED ORDER — INSULIN DETEMIR 100 UNIT/ML ~~LOC~~ SOLN
55.0000 [IU] | Freq: Every day | SUBCUTANEOUS | Status: DC
  Administered 2016-02-02: 55 [IU] via SUBCUTANEOUS
  Filled 2016-02-02 (×2): qty 0.55

## 2016-02-02 NOTE — Progress Notes (Signed)
Inpatient Diabetes Program Recommendations  AACE/ADA: New Consensus Statement on Inpatient Glycemic Control (2015)  Target Ranges:  Prepandial:   less than 140 mg/dL      Peak postprandial:   less than 180 mg/dL (1-2 hours)      Critically ill patients:  140 - 180 mg/dL  Results for Molly Savage, Molly Savage (MRN [id number]) as of 02/02/2016 09:17  Ref. Range 02/01/2016 17:55 02/01/2016 20:58 02/01/2016 23:24 02/02/2016 02:05 02/02/2016 06:46 02/02/2016 07:33 02/02/2016 08:21  Glucose-Capillary Latest Ref Range: 65 - 99 mg/dL 409180 (H) 811100 (H) 86 914162 (H) 52 (L) 49 (L) 65    Review of Glycemic Control  Current orders for Inpatient glycemic control: Levemir 70 units QHS, Novolog 6 units TID with meals, Novolog 0-20 units ACHS  Inpatient Diabetes Program Recommendations: Insulin - Basal: Please consider decreasing Levemir to 60 units QHS.  Thanks, Molly PennerMarie Florentina Marquart, RN, MSN, CDE Diabetes Coordinator Inpatient Diabetes Program [phone] (Team Pager from 8am to 5pm)

## 2016-02-02 NOTE — Lactation Note (Signed)
This note was copied from a baby's chart. Lactation Consultation Note Chart reviewed, RN set up DEBP.  Baby has had 5 bottle feeding with 3 breast feedings.  Baby in nursery for mom to rest post c/s. LC to follow prior to discharge.   Patient Name: Molly Savage [id number]'XToday's Date: 02/02/2016     Maternal Data    Feeding Feeding Type: Bottle Fed - Formula Length of feed: 25 min  LATCH Score/Interventions                      Lactation Tools Discussed/Used     Consult Status      [name], [name] 02/02/2016, 11:32 PM

## 2016-02-02 NOTE — Progress Notes (Signed)
Pts blood sugar is 70 at 1200.  Pt declines to take any insulin with lunch since after breakfast she dropped her blood sugar after taking her insulin.  Will continue to monitor her blood sugar.

## 2016-02-02 NOTE — Progress Notes (Signed)
Ac 56  this am     Instructed to eat and she hesitantly eats her crackers peanut butter and drank her juice  After 15 min AC was 49     instr her to order carbs/breakfast but after 30 min she still has not   Her levimer was only 70 units last night and she refused her sliding scale/food dosage

## 2016-02-02 NOTE — Progress Notes (Addendum)
Pt had her basil insulin with her breakfast at 0900. Pt requested her blood sugar be checked at 1045.  It was 42.  Pt given juice, peanut butter and graham crackers per protocol.  Will recheck.

## 2016-02-02 NOTE — Clinical Social Work Maternal (Signed)
  CLINICAL SOCIAL WORK MATERNAL/CHILD NOTE  Patient Details  Name: Molly Savage MRN: [id number] Date of Birth: Dec 20, 1990  Date:  02/02/2016  Clinical Social Worker Initiating Note:  Laurey Arrow Date/ Time Initiated:  02/02/16/1100     Child's Name:  Molly Savage   Legal Guardian:  (S) Mother (FOB is Lorelee Market)   Need for Interpreter:  None   Date of Referral:  02/01/16     Reason for Referral:  Behavioral Health Issues, including SI  (hx of depression)   Referral Source:  [employer]   Address:  [address] [place],  [address]  Phone number:  [phone]   Household Members:  Self, Spouse   Natural Supports (not living in the home):  Immediate Family, Extended Family (FOB's immediate an extended family. )   Professional Supports: None   Employment: Unemployed   Type of Work:     Education:  Contractor:  [employer]   Other Resources:  [employer], Physicist, medical    Cultural/Religious Considerations Which May Impact Care:  None Reported  Strengths:  Ability to meet basic needs , Engineer, materials , Understanding of illness, Home prepared for child    Risk Factors/Current Problems:  Mental Health Concerns    Cognitive State:  Alert , Distractible , Insightful    Mood/Affect:  Bright , Happy , Interested , Relaxed    CSW Assessment: CSW met with MOB to complete an assessment for hx of depression. MOB was bonding with infant as evident by MOB engaging in skin to skin during the assessment. CSW inquired about MOB's MH hx and MOB acknowledged a hx of depression. MOB reported that MOB was dx as a young child with depression and has experienced minimum to no symptoms as an adult. MOB denied currently being on medication and denied being in counseling. CSW educated MOB about PPD.  CSW also encouraged MOB to seek medical attention if needed for increased signs and symptoms for PPD. CSW offered MOB resources for  outpatient counseling, and MOB declined. MOB expressed that MOB feels comfortable asking for help and will reach out to MOB's OBGYN if help is warranted. CSW provided MOB with CSW contact information and encouraged MOB to reach out to CSW if MOB had any questions or concerns.  CSW Plan/Description:  Information/Referral to [employer] , [employer] , No Further Intervention Required/No Barriers to Discharge   Laurey Arrow, MSW, LCSW Clinical Social Work [phone]   Dimple Nanas, LCSW 02/02/2016, 12:22 PM

## 2016-02-02 NOTE — Progress Notes (Signed)
POSTPARTUM PROGRESS NOTE  Post OP Day 1  Subjective:  Molly Savage is a 26 y.o. G1P1001 1161w5d s/p LTCS for NRFHR. No acute events overnight.  Patient has not been eating or drinking much due to vomiting. Has been vomited multiple times and has not been urinating. Pain is well controlled. She has had flatus. She has not had bowel movement. Lochia Moderate. Patient also reports pruritus following anesthesia. Patient had low AM CBG of 49, however denies symptoms of hypoglycemia.   Objective: Blood pressure 137/70, pulse 88, temperature 98.3 F (36.8 C), temperature source Oral, resp. rate 18, height 5\' 8"  (1.727 m), weight 131.1 kg (289 lb), last menstrual period 05/02/2015, SpO2 98 %, unknown if currently breastfeeding.  Physical Exam:  General: alert, cooperative and no distress Lochia:normal flow Abdomen: soft, non-tender. Incision clean, dry, and intact w/ negative pressure dressing in place   Uterine Fundus: firm DVT Evaluation: No calf swelling or tenderness Extremities: No edema   Recent Labs  01/31/16 2058 02/02/16 0531  HGB 11.8* 8.1*  HCT 35.4* 23.5*    Assessment/Plan:  ASSESSMENT: Molly BloomSusan Deford is a 26 y.o. G1P1001 1361w5d s/p LTCS for NRFHR.    Plan for discharge tomorrow  Decrease basal insulin due to low AM CBGs   LOS: 2 days   Idelle LeechHolton [place], Medical Student PGY-1 Center for [facility], [facility]  02/02/2016, 11:17 AM    OB FELLOW MEDICAL STUDENT NOTE ATTESTATION  Note this is a medical student note and as such does not necessarily reflect the patient's plan of care. Please see progress note for this date of service.    Jen MowElizabeth Mumaw, DO [place] Fellow 02/02/2016

## 2016-02-02 NOTE — Progress Notes (Signed)
Subjective: Postpartum Day 1: Cesarean Delivery Patient reports incisional pain and tolerating PO.   Frustrated with breastfeeding  Objective: Vital signs in last 24 hours: Temp:  [97.8 F (36.6 C)-98.3 F (36.8 C)] 98.3 F (36.8 C) (01/12 0825) Pulse Rate:  [78-88] 88 (01/12 0825) Resp:  [18-20] 18 (01/12 0825) BP: (132-138)/(70-77) 137/70 (01/12 0825) SpO2:  [98 %-99 %] 98 % (01/12 44030619)  Physical Exam:  General: alert, cooperative and no distress Lochia: appropriate Uterine Fundus: firm Incision: healing well dressing dry and intact DVT Evaluation: No evidence of DVT seen on physical exam.   Recent Labs  01/31/16 2058 02/02/16 0531  HGB 11.8* 8.1*  HCT 35.4* 23.5*    Assessment/Plan: Status post Cesarean section. Doing well postoperatively.  Continue current care. MB nurse helping her with breastfeeding  Wynelle BourgeoisMarie Charina Fons 02/02/2016, 6:41 PM

## 2016-02-02 NOTE — Clinical Social Work Maternal (Signed)
  CLINICAL SOCIAL WORK MATERNAL/CHILD NOTE  Patient Details  Name: Molly Savage MRN: [id number] Date of Birth: 05/05/1990  Date:  02/02/2016  Clinical Social Worker Initiating Note:  Kennetta Pavlovic Boyd-Gilyard Date/ Time Initiated:  02/02/16/1100     Child's Name:  Molly Savage   Legal Guardian:  (S) Mother (FOB is Molly Savage)   Need for Interpreter:  None   Date of Referral:  02/01/16     Reason for Referral:  Behavioral Health Issues, including SI  (hx of depression)   Referral Source:  [facility]   Address:  [address] [place], [place] [address]  Phone number:  [phone]   Household Members:  Self, Spouse   Natural Supports (not living in the home):  Immediate Family, Extended Family (FOB's immediate an extended family. )   Professional Supports: None   Employment: Unemployed   Type of Work:     Education:  Vocation/technical training   Financial Resources:  Medicaid   Other Resources:  WIC, Food Stamps    Cultural/Religious Considerations Which May Impact Care:  None Reported  Strengths:  Ability to meet basic needs , Pediatrician chosen , Understanding of illness, Home prepared for child    Risk Factors/Current Problems:  Mental Health Concerns    Cognitive State:  Alert , Distractible , Insightful    Mood/Affect:  Bright , Happy , Interested , Relaxed    CSW Assessment: CSW met with MOB to complete an assessment for hx depression. MOB was bonding with infant as evident by MOB engaging in skin to skin during the assessment. MOB introduced MOB's guest as FOB (Molly Savage) and FOB's mother.  MOB gave CSW permission to meet with MOB while MOB's guest were present.  CSW inquired about MOB's MH hx and MOB acknowledged a hx of depression. MOB reported that MOB was dx as a young child with depression and has experienced minimum to no symptoms as an adult. MOB denied currently being on medication and denied being in counseling. CSW educated MOB about PPD.   CSW also encouraged MOB to seek medical attention if needed for increased signs and symptoms for PPD. CSW offered MOB resources for outpatient counseling, and MOB declined. MOB expressed that MOB feels comfortable asking for help and will reach out to MOB's OBGYN if help is warranted. CSW provided MOB with CSW contact information and encouraged MOB to reach out to CSW if MOB had any questions or concerns.  CSW Plan/Description:  Information/Referral to Community Resources , Patient/Family Education , No Further Intervention Required/No Barriers to Discharge    Haeleigh Streiff D BOYD-GILYARD, LCSW 02/02/2016, 12:31 PM 

## 2016-02-03 ENCOUNTER — Encounter (HOSPITAL_COMMUNITY): Payer: Self-pay | Admitting: Obstetrics and Gynecology

## 2016-02-03 ENCOUNTER — Inpatient Hospital Stay (HOSPITAL_COMMUNITY): Admission: RE | Admit: 2016-02-03 | Payer: Medicaid Other | Source: Ambulatory Visit

## 2016-02-03 LAB — GLUCOSE, CAPILLARY
Glucose-Capillary: 123 mg/dL — ABNORMAL HIGH (ref 65–99)
Glucose-Capillary: 110 mg/dL — ABNORMAL HIGH (ref 65–99)
Glucose-Capillary: 235 mg/dL — ABNORMAL HIGH (ref 65–99)
Glucose-Capillary: 165 mg/dL — ABNORMAL HIGH (ref 65–99)

## 2016-02-03 MED ORDER — OXYCODONE HCL 5 MG PO TABS: 5.0000 mg | ORAL_TABLET | ORAL | 0 refills | Status: DC | PRN

## 2016-02-03 MED ORDER — IBUPROFEN 600 MG PO TABS
600.0000 mg | ORAL_TABLET | Freq: Four times a day (QID) | ORAL | 0 refills | Status: DC
Start: 2016-02-03 — End: 2016-06-04

## 2016-02-03 NOTE — Discharge Summary (Signed)
OB Discharge Summary     Patient Name: Molly Savage DOB: [birth year] MRN: [id number]  Date of admission: 01/31/2016 Delivering MD: Catalina Antigua   Date of discharge: 02/04/2016  Admitting diagnosis: Induction  Intrauterine pregnancy: [redacted]w[redacted]d     Secondary diagnosis:  Active Problems:   Chronic hypertension   Gestational hypertension      Discharge diagnosis: Term Pregnancy Delivered                                                                                                Post partum procedures:none  Augmentation: Pitocin and Cytotec  Complications: None  Hospital course:  Onset of Labor With Unplanned C/S  26 y.o. yo G1P1001 at [redacted]w[redacted]d was admitted in Latent Labor on 01/31/2016. Patient had a labor course significant for NRFHR. Membrane Rupture Time/Date: 5:20 AM ,02/01/2016   The patient went for cesarean section due to Non-Reassuring FHR, and delivered a Viable infant,02/01/2016  Details of operation can be found in separate operative note. Patient had an uncomplicated postpartum course.  She is ambulating,tolerating a regular diet, passing flatus, and urinating well.  Patient is discharged home in stable condition 02/04/16.   Physical exam  Vitals:   02/02/16 0619 02/02/16 0825 02/02/16 1815 02/03/16 0608  BP: 138/77 137/70 134/83 (!) 145/81  Pulse: 81 88 84 81  Resp: 20 18 18 18   Temp: 97.8 F (36.6 C) 98.3 F (36.8 C) 98.4 F (36.9 C) 98.1 F (36.7 C)  TempSrc:  Oral Oral Oral  SpO2: 98%     Weight:      Height:       General: alert, cooperative and no distress Lochia: appropriate Uterine Fundus: firm Incision: Healing well with no significant drainage, No significant erythema, Dressing is clean, dry, and intact DVT Evaluation: No evidence of DVT seen on physical exam. Negative Homan's sign. No cords or calf tenderness. Labs: Lab Results  Component Value Date   WBC 12.3 (H) 02/02/2016   HGB 8.1 (L) 02/02/2016   HCT 23.5 (L) 02/02/2016   MCV 81.6  02/02/2016   PLT 221 02/02/2016   CMP Latest Ref Rng & Units 01/31/2016  Glucose 65 - 99 mg/dL 11(BJ)  BUN 6 - 20 mg/dL 11  Creatinine 4.78 - 2.95 mg/dL 6.21  Sodium 308 - 657 mmol/L 135  Potassium 3.5 - 5.1 mmol/L 4.1  Chloride 101 - 111 mmol/L 106  CO2 22 - 32 mmol/L 23  Calcium 8.9 - 10.3 mg/dL 8.4(O)  Total Protein 6.5 - 8.1 g/dL 6.5  Total Bilirubin 0.3 - 1.2 mg/dL 0.5  Alkaline Phos 38 - 126 U/L 184(H)  AST 15 - 41 U/L 18  ALT 14 - 54 U/L 14    Discharge instruction: per After Visit Summary and "Baby and Me Booklet".  After visit meds:  Allergies as of 02/03/2016   No Known Allergies     Medication List    STOP taking these medications   omeprazole 20 MG capsule Commonly known as:  PRILOSEC     TAKE these medications   acetaminophen 500 MG tablet Commonly known as:  TYLENOL Take  2,000 mg by mouth every 6 (six) hours as needed for moderate pain.   ibuprofen 600 MG tablet Commonly known as:  ADVIL,MOTRIN Take 1 tablet (600 mg total) by mouth every 6 (six) hours.   insulin aspart 100 UNIT/ML injection Commonly known as:  novoLOG Inject 10-40 Units into the skin 3 (three) times daily before meals. Pt self adjusts her doses based on what she is eating.   insulin detemir 100 UNIT/ML injection Commonly known as:  LEVEMIR Inject 1.1 mLs (110 Units total) into the skin at bedtime. Take 100 units of insulin at bedtime   Insulin Syringe-Needle U-100 27G X 1/2" 1 ML Misc Commonly known as:  SAFETY INSULIN SYRINGES 1 each by Does not apply route 4 (four) times daily.   oxyCODONE 5 MG immediate release tablet Commonly known as:  Oxy IR/ROXICODONE Take 1 tablet (5 mg total) by mouth every 4 (four) hours as needed (pain scale 4-7).   PRENATAL VITAMIN PO Take 2 tablets by mouth daily.       Diet: routine diet  Activity: Advance as tolerated. Pelvic rest for 6 weeks.   Outpatient follow up:2 weeks Follow up Appt: Future Appointments Date Time Provider  Department Center  03/05/2016 8:40 AM Donette LarryMelanie Bhambri, CNM [facility]   Follow up Visit:No Follow-up on file.  Order for baby love placed to have BP checked on 02/05/15  Postpartum contraception: Undecided  Newborn Data: Live born female  Birth Weight: 6 lb 1.7 oz (2770 g) APGAR: 8, 9  Baby Feeding: Breast Disposition:home with mother   02/04/2016 Beaulah Dinninghristina M Gambino, MD  OB FELLOW DISCHARGE ATTESTATION  I have seen and examined this patient and agree with above documentation in the resident's note.   Ernestina PennaNicholas Schenk, MD 7:45 AM

## 2016-02-03 NOTE — Discharge Instructions (Signed)
Postpartum Care After Cesarean Delivery  The period of time right after you deliver your newborn is called the postpartum period.  What kind of medical care will I receive?  · You may continue to receive fluids and medicines through an IV tube inserted into one of your veins.  · You may have small, flexible tube (catheter) draining urine from your bladder into a bag outside of your body. The catheter will be removed as soon as possible.  · You may be given a squirt bottle to use when you go to the bathroom. You may use this until you are comfortable wiping as usual. To use the squirt bottle, follow these steps:  ? Before you urinate, fill the squirt bottle with warm water. The water should be warm. Do not use hot water.  ? After you urinate, while you are sitting on the toilet, use the squirt bottle to rinse the area around your urethra and vaginal opening. This rinses away any urine and blood.  ? You may do this instead of wiping. As you start healing, you may use the squirt bottle before wiping yourself. Make sure to wipe gently.  ? Fill the squirt bottle with clean water every time you use the bathroom.  · You will be given sanitary pads to wear.  · Your incision will be monitored to make sure it is healing properly. You will be told when it is safe for your stitches, staples, or skin adhesive tape to be removed.  What can I expect?  · You may not feel the need to urinate for several hours after delivery.  · You will have some soreness and pain in your abdomen. You may have a small amount of blood or clear fluid coming from your incision.  · If you are breastfeeding, you may have uterine contractions every time you breastfeed for up to several weeks postpartum. Uterine contractions help your uterus return to its normal size.  · It is normal to have vaginal bleeding (lochia) after delivery. The amount and appearance of lochia is often similar to a menstrual period in the first week after delivery. It will  gradually decrease over the next few weeks to a dry, yellow-brown discharge. For most women, lochia stops completely by 6-8 weeks after delivery. Vaginal bleeding can vary from woman to woman.  · Within the first few days after delivery, you may have breast engorgement. This is when your breasts feel heavy, full, and uncomfortable. Your breasts may also throb and feel hard, tightly stretched, warm, and tender. After this occurs, you may have milk leaking from your breasts. Your health care provider can help you relieve discomfort due to breast engorgement. Breast engorgement should go away within a few days.  · You may feel more sad or worried than normal due to hormonal changes after delivery. These feelings should not last more than a few days. If these feelings do not go away after several days, speak with your health care provider.  How should I care for myself?  · Tell your health care provider if you have pain or discomfort.  · Drink enough water to keep your urine clear or pale yellow.  · Wash your hands thoroughly with soap and water for at least 20 seconds after changing your sanitary pads or using the toilet, and before holding or feeding your baby.  · If you are not breastfeeding, avoid touching your breasts a lot. Doing this can make your breasts produce more milk.  · If   you become weak or lightheaded, or you feel like you might faint, ask for help before:  ? Getting out of bed.  ? Showering.  · Change your sanitary pads frequently. Watch for any changes in your flow, such as a sudden increase in volume, a change in color, or the passing of large blood clots. If you pass a blood clot from your vagina, save it to show to your health care provider. Do not flush blood clots down the toilet without having your health care provider look at them.  · Make sure that all your vaccinations are up to date. This can help protect you and your baby from getting certain diseases. You may need to have immunizations done  before you leave the hospital.  · If desired, talk with your health care provider about methods of family planning or birth control (contraception).  How can I start bonding with my baby?  Spending as much time as possible with your baby is very important. During this time, you and your baby can get to know each other and develop a bond. Having your baby stay with you in your room (rooming in) can give you time to get to know your baby. Rooming in can also help you become comfortable caring for your baby. Breastfeeding can also help you bond with your baby.  How can I plan for returning home with my baby?  · Make sure that you have a car seat installed in your vehicle.  ? Your car seat should be checked by a certified car seat installer to make sure that it is installed safely.  ? Make sure that your baby fits into the car seat safely.  · Ask your health care provider any questions you have about caring for yourself or your baby. Make sure that you are able to contact your health care provider with any questions after leaving the hospital.  This information is not intended to replace advice given to you by your health care provider. Make sure you discuss any questions you have with your health care provider.  Document Released: 10/02/2011 Document Revised: 06/12/2015 Document Reviewed: 12/12/2014  Elsevier Interactive Patient Education © 2017 [employer]

## 2016-02-03 NOTE — Lactation Note (Signed)
This note was copied from a baby's chart. Lactation Consultation Note  Patient Name: Molly [name] JXBJY'NToday's Date: 02/03/2016 Reason for consult: Follow-up assessment;Difficult latch;Infant < 6lbs Mom has been mostly bottle feeding, now going home and would like help with latch. Mom's breasts appear to have limited glandular tissue, tubular shaped bilateral with wide spacing, at least 4 FB between breasts. Mom reports minimal breast changes 1st trimester. No history of infertility. Baby can latch using breast compression, difficult for Mom to support breast due to shape of breast but after several attempts was able to get baby to latch. Once baby is latched he demonstrates good suckling bursts. Mom reports she plans to try BF but will stay with bottle feeding if continues to have difficulty with latch. LC advised Mom to start offering breast with each feeding, both breasts when possible but to continue to supplement with each feeding till her milk comes in and she can have follow up for pre/post weight due to appearance of limited glandular tissue and concern regarding ability to make milk.  Offered to schedule OP lactation appointment but Mom will need to call back. [facility], hand pump given with instructions. Advised baby should be at breast 8-12 times in 24 hours and with feeding ques. Keep baby nursing for 15-20 minutes both breasts when possible. Continue to supplement according to handout per hours of age at least every 3 hours. Encouraged to post pump every 3 hours for 15 minutes to encourage milk production.  Advised Mom if baby does not go to breast to follow guidelines for bottle feeding only. Advised baby needs to eat at least every 3 hours. Prior to my visit, Mom reports baby had not eaten since 0830 this am. Engorgement care discussed should this occur. Advised of support group.  Encouraged to call for questions/concerns.    Maternal Data     Feeding Feeding Type: Breast Fed Length of feed: 15 min  LATCH Score/Interventions Latch: Repeated attempts needed to sustain latch, nipple held in mouth throughout feeding, stimulation needed to elicit sucking reflex. Intervention(s): Adjust position;Assist with latch;Breast massage;Breast compression  Audible Swallowing: A few with stimulation  Type of Nipple: Everted at rest and after stimulation  Comfort (Breast/Nipple): Soft / non-tender     Hold (Positioning): Assistance needed to correctly position infant at breast and maintain latch. Intervention(s): Breastfeeding basics reviewed;Support Pillows;Position options;Skin to skin  LATCH Score: 7  Lactation Tools Discussed/Used Tools: Pump Breast pump type: Double-Electric Breast Pump   Consult Status Consult Status: Complete Date: 02/03/16 Follow-up type: In-patient    Alfred LevinsGranger, Heavin Sebree Ann 02/03/2016, 2:47 PM

## 2016-02-04 ENCOUNTER — Encounter (HOSPITAL_COMMUNITY): Payer: Self-pay | Admitting: Certified Nurse Midwife

## 2016-02-04 ENCOUNTER — Inpatient Hospital Stay (HOSPITAL_COMMUNITY)
Admission: AD | Admit: 2016-02-04 | Discharge: 2016-02-05 | Disposition: A | Discharge status: Home / Self Care | Payer: Medicaid Other | Source: Ambulatory Visit | Attending: Obstetrics & Gynecology | Admitting: Obstetrics & Gynecology

## 2016-02-04 DIAGNOSIS — O1205 Gestational edema, complicating the puerperium: Secondary | ICD-10-CM | POA: Insufficient documentation

## 2016-02-04 DIAGNOSIS — F17210 Nicotine dependence, cigarettes, uncomplicated: Secondary | ICD-10-CM | POA: Diagnosis not present

## 2016-02-04 DIAGNOSIS — O10919 Unspecified pre-existing hypertension complicating pregnancy, unspecified trimester: Secondary | ICD-10-CM | POA: Diagnosis not present

## 2016-02-04 DIAGNOSIS — O99335 Smoking (tobacco) complicating the puerperium: Secondary | ICD-10-CM | POA: Diagnosis not present

## 2016-02-04 DIAGNOSIS — O1003 Pre-existing essential hypertension complicating the puerperium: Secondary | ICD-10-CM | POA: Insufficient documentation

## 2016-02-04 DIAGNOSIS — R609 Edema, unspecified: Secondary | ICD-10-CM

## 2016-02-04 LAB — GLUCOSE, CAPILLARY: Glucose-Capillary: 51 mg/dL — ABNORMAL LOW (ref 65–99)

## 2016-02-04 NOTE — MAU Note (Signed)
Patient presents to MAU with complaints of bilateral lower extremity edema. Patient is postpartum C/S day 3. States she had elevated blood pressure during pregnancy. Denies headache.

## 2016-02-04 NOTE — MAU Note (Signed)
Urine sent to lab 

## 2016-02-05 DIAGNOSIS — O10919 Unspecified pre-existing hypertension complicating pregnancy, unspecified trimester: Secondary | ICD-10-CM

## 2016-02-05 LAB — URINALYSIS, ROUTINE W REFLEX MICROSCOPIC
Bacteria, UA: NONE SEEN
Bilirubin Urine: NEGATIVE
Glucose, UA: NEGATIVE mg/dL
Ketones, ur: NEGATIVE mg/dL
Nitrite: NEGATIVE
Protein, ur: 30 mg/dL — AB
Specific Gravity, Urine: 1.013 (ref 1.005–1.030)
pH: 6 (ref 5.0–8.0)

## 2016-02-05 LAB — COMPREHENSIVE METABOLIC PANEL
ALT: 31 U/L (ref 14–54)
AST: 29 U/L (ref 15–41)
Albumin: 2.5 g/dL — ABNORMAL LOW (ref 3.5–5.0)
Alkaline Phosphatase: 119 U/L (ref 38–126)
Anion gap: 9 (ref 5–15)
BUN: 14 mg/dL (ref 6–20)
CO2: 24 mmol/L (ref 22–32)
Calcium: 8.7 mg/dL — ABNORMAL LOW (ref 8.9–10.3)
Chloride: 106 mmol/L (ref 101–111)
Creatinine, Ser: 0.52 mg/dL (ref 0.44–1.00)
GFR calc Af Amer: >60 mL/min (ref >60)
GFR calc non Af Amer: >60 mL/min (ref >60)
Glucose, Bld: 40 mg/dL — CL (ref 65–99)
Potassium: 3.7 mmol/L (ref 3.5–5.1)
Sodium: 139 mmol/L (ref 135–145)
Total Bilirubin: 0.4 mg/dL (ref 0.3–1.2)
Total Protein: 6 g/dL — ABNORMAL LOW (ref 6.5–8.1)

## 2016-02-05 LAB — CBC
HCT: 28.7 % — ABNORMAL LOW (ref 36.0–46.0)
Hemoglobin: 9.5 g/dL — ABNORMAL LOW (ref 12.0–15.0)
MCH: 27.1 pg (ref 26.0–34.0)
MCHC: 33.1 g/dL (ref 30.0–36.0)
MCV: 81.8 fL (ref 78.0–100.0)
Platelets: 281 10*3/uL (ref 150–400)
RBC: 3.51 MIL/uL — ABNORMAL LOW (ref 3.87–5.11)
RDW: 14 % (ref 11.5–15.5)
WBC: 9.2 10*3/uL (ref 4.0–10.5)

## 2016-02-05 LAB — PROTEIN / CREATININE RATIO, URINE
Creatinine, Urine: 59 mg/dL
Protein Creatinine Ratio: 0.51 mg/mg{Cre} — ABNORMAL HIGH (ref 0.00–0.15)
Total Protein, Urine: 30 mg/dL

## 2016-02-05 MED ORDER — HYDROCHLOROTHIAZIDE 25 MG PO TABS: 25.0000 mg | ORAL_TABLET | Freq: Every day | ORAL | 3 refills | Status: DC

## 2016-02-05 NOTE — MAU Provider Note (Signed)
Chief Complaint: Postpartum Complications   First Provider Initiated Contact with Patient 02/05/16 0006      SUBJECTIVE HPI: Molly Savage is a 26 y.o. G1P1001 on POD#5 following PLTCS who presents to maternity admissions reporting swelling of her feet and legs. She reports the swelling started in the hospital and became worse before discharge on 1/13. She denies any changes in swelling or other symptoms since leaving the hospital. She was induced for [facility] and Type 1 DM with 6/8 BPP and had primary C/S for fetal indications.  She reports she is not urinating very much since leaving the hospital.  She thinks her blood pressures may be high because she is upset at having to wait so long in MAU tonight.  She denies h/a, epigastric pain, or visual disturbances. She has tried to put her feet up to reduce her swelling but it has not helped.  Her swelling is constant and unchanged in last few days.  She indicates that she is not staying in the hospital and only came in because her family made her call the nurse call line tonight. She reports light lochia, denies abdominal pain, vaginal itching/burning, urinary symptoms, h/a, dizziness, n/v, or fever/chills.     HPI  Past Medical History:  Diagnosis Date  . Diabetes mellitus without complication (HCC)   . Tobacco abuse    Past Surgical History:  Procedure Laterality Date  . CESAREAN SECTION N/A 02/01/2016   Procedure: CESAREAN SECTION;  Surgeon: Catalina Antigua, MD;  Location: [facility];  Service: Obstetrics;  Laterality: N/A;  . NO PAST SURGERIES     Social History   Social History  . Marital status: Significant Other    Spouse name: N/A  . Number of children: N/A  . Years of education: N/A   Occupational History  . Not on file.   Social History Main Topics  . Smoking status: Current Every Day Smoker    Packs/day: 0.50    Years: 12.00    Types: Cigarettes  . Smokeless tobacco: Never Used  . Alcohol use No  . Drug use: No   . Sexual activity: Yes    Birth control/ protection: None   Other Topics Concern  . Not on file   Social History Narrative  . No narrative on file   No current facility-administered medications on file prior to encounter.    Current Outpatient Prescriptions on File Prior to Encounter  Medication Sig Dispense Refill  . acetaminophen (TYLENOL) 500 MG tablet Take 2,000 mg by mouth every 6 (six) hours as needed for moderate pain.     Marland Kitchen ibuprofen (ADVIL,MOTRIN) 600 MG tablet Take 1 tablet (600 mg total) by mouth every 6 (six) hours. 30 tablet 0  . insulin aspart (NOVOLOG) 100 UNIT/ML injection Inject 10-40 Units into the skin 3 (three) times daily before meals. Pt self adjusts her doses based on what she is eating.    . insulin detemir (LEVEMIR) 100 UNIT/ML injection Inject 1.1 mLs (110 Units total) into the skin at bedtime. Take 100 units of insulin at bedtime 3 vial 12  . Insulin Syringe-Needle U-100 (SAFETY INSULIN SYRINGES) 27G X 1/2" 1 ML MISC 1 each by Does not apply route 4 (four) times daily. 100 each 5  . oxyCODONE (OXY IR/ROXICODONE) 5 MG immediate release tablet Take 1 tablet (5 mg total) by mouth every 4 (four) hours as needed (pain scale 4-7). 10 tablet 0  . Prenatal Vit-Fe Fumarate-FA (PRENATAL VITAMIN PO) Take 2 tablets by mouth daily.  No Known Allergies  ROS:  Review of Systems  Constitutional: Negative for chills, fatigue and fever.  Eyes: Negative for visual disturbance.  Respiratory: Negative for shortness of breath.   Cardiovascular: Positive for leg swelling. Negative for chest pain.  Gastrointestinal: Negative for abdominal pain, nausea and vomiting.  Genitourinary: Positive for vaginal bleeding. Negative for difficulty urinating, dysuria, flank pain, pelvic pain, vaginal discharge and vaginal pain.  Neurological: Negative for dizziness and headaches.  Psychiatric/Behavioral: Positive for agitation.     I have reviewed patient's Past Medical Hx, Surgical  Hx, Family Hx, Social Hx, medications and allergies.   Physical Exam   Patient Vitals for the past 24 hrs:  BP Temp Temp src Pulse Resp  02/05/16 0230 165/93 97.6 F (36.4 C) Oral 75 16  02/05/16 0018 159/88 - - 77 -  02/05/16 0004 149/75 - - 75 -  02/04/16 2349 134/74 - - 76 -  02/04/16 2334 146/71 - - 75 -  02/04/16 2319 145/77 - - 75 -  02/04/16 2304 177/95 - - 78 -  02/04/16 2302 159/85 - - 80 -  02/04/16 2219 143/80 - - 82 -  02/04/16 2204 146/83 - - 90 -  02/04/16 2149 132/66 - - 82 -  02/04/16 2135 151/78 97.4 F (36.3 C) Oral 86 18   Constitutional: Well-developed, well-nourished female in moderate distress.  HEART: normal rate, heart sounds, regular rhythm RESP: normal effort, lung sounds clear and equal bilaterally GI: Abd soft, non-tender. Pos BS x 4 MS: Extremities nontender, +1 generalized edema, nonpitting, normal ROM Neurologic: Alert and oriented x 4.  GU: Neg CVAT.    LAB RESULTS Results for orders placed or performed during the hospital encounter of 02/04/16 (from the past 24 hour(s))  Glucose, capillary     Status: Abnormal   Collection Time: 02/04/16  9:43 PM  Result Value Ref Range   Glucose-Capillary 51 (L) 65 - 99 mg/dL  Protein / creatinine ratio, urine     Status: Abnormal   Collection Time: 02/04/16 10:39 PM  Result Value Ref Range   Creatinine, Urine 59.00 mg/dL   Total Protein, Urine 30 mg/dL   Protein Creatinine Ratio 0.51 (H) 0.00 - 0.15 mg/mg[Cre]  Urinalysis, Routine w reflex microscopic     Status: Abnormal   Collection Time: 02/04/16 10:39 PM  Result Value Ref Range   Color, Urine YELLOW YELLOW   APPearance HAZY (A) CLEAR   Specific Gravity, Urine 1.013 1.005 - 1.030   pH 6.0 5.0 - 8.0   Glucose, UA NEGATIVE NEGATIVE mg/dL   Hgb urine dipstick LARGE (A) NEGATIVE   Bilirubin Urine NEGATIVE NEGATIVE   Ketones, ur NEGATIVE NEGATIVE mg/dL   Protein, ur 30 (A) NEGATIVE mg/dL   Nitrite NEGATIVE NEGATIVE   Leukocytes, UA SMALL (A)  NEGATIVE   RBC / HPF TOO NUMEROUS TO COUNT 0 - 5 RBC/hpf   WBC, UA 6-30 0 - 5 WBC/hpf   Bacteria, UA NONE SEEN NONE SEEN   Squamous Epithelial / LPF 0-5 (A) NONE SEEN   Mucous PRESENT   CBC     Status: Abnormal   Collection Time: 02/05/16 12:13 AM  Result Value Ref Range   WBC 9.2 4.0 - 10.5 K/uL   RBC 3.51 (L) 3.87 - 5.11 MIL/uL   Hemoglobin 9.5 (L) 12.0 - 15.0 g/dL   HCT 16.1 (L) 09.6 - 04.5 %   MCV 81.8 78.0 - 100.0 fL   MCH 27.1 26.0 - 34.0 pg   MCHC 33.1  30.0 - 36.0 g/dL   RDW 82.914.0 56.211.5 - 13.015.5 %   Platelets 281 150 - 400 K/uL  Comprehensive metabolic panel     Status: Abnormal   Collection Time: 02/05/16 12:13 AM  Result Value Ref Range   Sodium 139 135 - 145 mmol/L   Potassium 3.7 3.5 - 5.1 mmol/L   Chloride 106 101 - 111 mmol/L   CO2 24 22 - 32 mmol/L   Glucose, Bld 40 (LL) 65 - 99 mg/dL   BUN 14 6 - 20 mg/dL   Creatinine, Ser 8.650.52 0.44 - 1.00 mg/dL   Calcium 8.7 (L) 8.9 - 10.3 mg/dL   Total Protein 6.0 (L) 6.5 - 8.1 g/dL   Albumin 2.5 (L) 3.5 - 5.0 g/dL   AST 29 15 - 41 U/L   ALT 31 14 - 54 U/L   Alkaline Phosphatase 119 38 - 126 U/L   Total Bilirubin 0.4 0.3 - 1.2 mg/dL   GFR calc non Af Amer >60 >60 mL/min   GFR calc Af Amer >60 >60 mL/min   Anion gap 9 5 - 15    --/--/O POS, O POS (01/10 0825)  IMAGING   MAU Management/MDM: Ordered labs and reviewed results.  Initial U/A with large hgb and P/C ratio elevated.  Attempted to I&O cath but second sample is visibly bloody.  Pt blood glucose low but pt drinking juice in MAU and denies any symptoms.  Consult Eure.  With hx of CHTN and borderline BP with asymptomatic pt, treat with HCTZ and follow up in office later this week.  Preeclampsia precautions reviewed.  Encouraged pt to have something with protein (milk, peanut butter?) when she gets home before going to bed.  Pt stable at time of discharge.  ASSESSMENT 1. 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 Dependent edema     PLAN Discharge  home with preeclampsia precautions  Allergies as of 02/05/2016   No Known Allergies     Medication List    TAKE these medications   acetaminophen 500 MG tablet Commonly known as:  TYLENOL Take 2,000 mg by mouth every 6 (six) hours as needed for moderate pain.   hydrochlorothiazide 25 MG tablet Commonly known as:  HYDRODIURIL Take 1 tablet (25 mg total) by mouth daily.   ibuprofen 600 MG tablet Commonly known as:  ADVIL,MOTRIN Take 1 tablet (600 mg total) by mouth every 6 (six) hours.   insulin aspart 100 UNIT/ML injection Commonly known as:  novoLOG Inject 10-40 Units into the skin 3 (three) times daily before meals. Pt self adjusts her doses based on what she is eating.   insulin detemir 100 UNIT/ML injection Commonly known as:  LEVEMIR Inject 1.1 mLs (110 Units total) into the skin at bedtime. Take 100 units of insulin at bedtime   Insulin Syringe-Needle U-100 27G X 1/2" 1 ML Misc Commonly known as:  SAFETY INSULIN SYRINGES 1 each by Does not apply route 4 (four) times daily.   oxyCODONE 5 MG immediate release tablet Commonly known as:  Oxy IR/ROXICODONE Take 1 tablet (5 mg total) by mouth every 4 (four) hours as needed (pain scale 4-7).   PRENATAL VITAMIN PO Take 2 tablets by mouth daily.      Follow-up Information    [facility]   Specialty:  Obstetrics and Gynecology Why:  The office will call you to set up appointment later this week. Return to MAU as needed for emergencies. Contact information: [address]  [address] [place] [address] [phone]          Sharen Counter Certified Nurse-Midwife 02/05/2016  2:51 AM

## 2016-02-05 NOTE — Discharge Instructions (Signed)
Hypertension During Pregnancy °Hypertension, commonly called high blood pressure, is when the force of blood pumping through your arteries is too strong. Arteries are blood vessels that carry blood from the heart throughout the body. Hypertension during pregnancy can cause problems for you and your baby. Your baby may be born early (prematurely) or may not weigh as much as he or she should at birth. Very bad cases of hypertension during pregnancy can be life-threatening. °Different types of hypertension can occur during pregnancy. These include: °· Chronic hypertension. This happens when: °¨ You have hypertension before pregnancy and it continues during pregnancy. °¨ You develop hypertension before you are [redacted] weeks pregnant, and it continues during pregnancy. °· Gestational hypertension. This is hypertension that develops after the 20th week of pregnancy. °· Preeclampsia, also called toxemia of pregnancy. This is a very serious type of hypertension that develops only during pregnancy. It affects the whole body, and it can be very dangerous for you and your baby. °Gestational hypertension and preeclampsia usually go away within 6 weeks after your baby is born. Women who have hypertension during pregnancy have a greater chance of developing hypertension later in life or during future pregnancies. °What are the causes? °The exact cause of hypertension is not known. °What increases the risk? °There are certain factors that make it more likely for you to develop hypertension during pregnancy. These include: °· Having hypertension during a previous pregnancy or prior to pregnancy. °· Being overweight. °· Being older than age 40. °· Being pregnant for the first time or being pregnant with more than one baby. °· Becoming pregnant using fertilization methods such as IVF (in vitro fertilization). °· Having diabetes, kidney problems, or systemic lupus erythematosus. °· Having a family history of hypertension. °What are the  signs or symptoms? °Chronic hypertension and gestational hypertension rarely cause symptoms. Preeclampsia causes symptoms, which may include: °· Increased protein in your urine. Your health care provider will check for this at every visit before you give birth (prenatal visit). °· Severe headaches. °· Sudden weight gain. °· Swelling of the hands, face, legs, and feet. °· Nausea and vomiting. °· Vision problems, such as blurred or double vision. °· Numbness in the face, arms, legs, and feet. °· Dizziness. °· Slurred speech. °· Sensitivity to bright lights. °· Abdominal pain. °· Convulsions. °How is this diagnosed? °You may be diagnosed with hypertension during a routine prenatal exam. At each prenatal visit, you may: °· Have a urine test to check for high amounts of protein in your urine. °· Have your blood pressure checked. A blood pressure reading is recorded as two numbers, such as "120 over 80" (or 120/80). The first ("top") number is called the systolic pressure. It is a measure of the pressure in your arteries when your heart beats. The second ("bottom") number is called the diastolic pressure. It is a measure of the pressure in your arteries as your heart relaxes between beats. Blood pressure is measured in a unit called mm Hg. A normal blood pressure reading is: °¨ Systolic: below 120. °¨ Diastolic: below 80. °The type of hypertension that you are diagnosed with depends on your test results and when your symptoms developed. °· Chronic hypertension is usually diagnosed before 20 weeks of pregnancy. °· Gestational hypertension is usually diagnosed after 20 weeks of pregnancy. °· Hypertension with high amounts of protein in the urine is diagnosed as preeclampsia. °· Blood pressure measurements that stay above 160 systolic, or above 110 diastolic, are signs of severe preeclampsia. °  How is this treated? Treatment for hypertension during pregnancy varies depending on the type of hypertension you have and how  serious it is.  If you take medicines called ACE inhibitors to treat chronic hypertension, you may need to switch medicines. ACE inhibitors should not be taken during pregnancy.  If you have gestational hypertension, you may need to take blood pressure medicine.  If you are at risk for preeclampsia, your health care provider may recommend that you take a low-dose aspirin every day to prevent high blood pressure during your pregnancy.  If you have severe preeclampsia, you may need to be hospitalized so you and your baby can be monitored closely. You may also need to take medicine (magnesium sulfate) to prevent seizures and to lower blood pressure. This medicine may be given as an injection or through an IV tube.  In some cases, if your condition gets worse, you may need to deliver your baby early. Follow these instructions at home: Eating and drinking  Drink enough fluid to keep your urine clear or pale yellow.  Eat a healthy diet that is low in salt (sodium). Do not add salt to your food. Check food labels to see how much sodium a food or beverage contains. Lifestyle  Do not use any products that contain nicotine or tobacco, such as cigarettes and e-cigarettes. If you need help quitting, ask your health care provider.  Do not use alcohol.  Avoid caffeine.  Avoid stress as much as possible. Rest and get plenty of sleep. General instructions  Take over-the-counter and prescription medicines only as told by your health care provider.  While lying down, lie on your left side. This keeps pressure off your baby.  While sitting or lying down, raise (elevate) your feet. Try putting some pillows under your lower legs.  Exercise regularly. Ask your health care provider what kinds of exercise are best for you.  Keep all prenatal and follow-up visits as told by your health care provider. This is important. Contact a health care provider if:  You have symptoms that your health care provider  told you may require more treatment or monitoring, such as:  Fever.  Vomiting.  Headache. Get help right away if:  You have severe abdominal pain or vomiting that does not get better with treatment.  You suddenly develop swelling in your hands, ankles, or face.  You gain 4 lbs (1.8 kg) or more in 1 week.  You develop vaginal bleeding, or you have blood in your urine.  You do not feel your baby moving as much as usual.  You have blurred or double vision.  You have muscle twitching or sudden tightening (spasms).  You have shortness of breath.  Your lips or fingernails turn blue. This information is not intended to replace advice given to you by your health care provider. Make sure you discuss any questions you have with your health care provider. Document Released: 09/25/2010 Document Revised: 07/28/2015 Document Reviewed: 06/23/2015 Elsevier Interactive Patient Education  2017 Elsevier Inc.   Edema Edema is an abnormal buildup of fluids in your bodytissues. Edema is somewhatdependent on gravity to pull the fluid to the lowest place in your body. That makes the condition more common in the legs and thighs (lower extremities). Painless swelling of the feet and ankles is common and becomes more likely as you get older. It is also common in looser tissues, like around your eyes. When the affected area is squeezed, the fluid may move out of that  spot and leave a dent for a few moments. This dent is called pitting. What are the causes? There are many possible causes of edema. Eating too much salt and being on your feet or sitting for a long time can cause edema in your legs and ankles. Hot weather may make edema worse. Common medical causes of edema include:  Heart failure.  Liver disease.  Kidney disease.  Weak blood vessels in your legs.  Cancer.  An injury.  Pregnancy.  Some medications.  Obesity. What are the signs or symptoms? Edema is usually painless.Your  skin may look swollen or shiny. How is this diagnosed? Your health care provider may be able to diagnose edema by asking about your medical history and doing a physical exam. You may need to have tests such as X-rays, an electrocardiogram, or blood tests to check for medical conditions that may cause edema. How is this treated? Edema treatment depends on the cause. If you have heart, liver, or kidney disease, you need the treatment appropriate for these conditions. General treatment may include:  Elevation of the affected body part above the level of your heart.  Compression of the affected body part. Pressure from elastic bandages or support stockings squeezes the tissues and forces fluid back into the blood vessels. This keeps fluid from entering the tissues.  Restriction of fluid and salt intake.  Use of a water pill (diuretic). These medications are appropriate only for some types of edema. They pull fluid out of your body and make you urinate more often. This gets rid of fluid and reduces swelling, but diuretics can have side effects. Only use diuretics as directed by your health care provider. Follow these instructions at home:  Keep the affected body part above the level of your heart when you are lying down.  Do not sit still or stand for prolonged periods.  Do not put anything directly under your knees when lying down.  Do not wear constricting clothing or garters on your upper legs.  Exercise your legs to work the fluid back into your blood vessels. This may help the swelling go down.  Wear elastic bandages or support stockings to reduce ankle swelling as directed by your health care provider.  Eat a low-salt diet to reduce fluid if your health care provider recommends it.  Only take medicines as directed by your health care provider. Contact a health care provider if:  Your edema is not responding to treatment.  You have heart, liver, or kidney disease and notice symptoms  of edema.  You have edema in your legs that does not improve after elevating them.  You have sudden and unexplained weight gain. Get help right away if:  You develop shortness of breath or chest pain.  You cannot breathe when you lie down.  You develop pain, redness, or warmth in the swollen areas.  You have heart, liver, or kidney disease and suddenly get edema.  You have a fever and your symptoms suddenly get worse. This information is not intended to replace advice given to you by your health care provider. Make sure you discuss any questions you have with your health care provider. Document Released: 01/07/2005 Document Revised: 06/15/2015 Document Reviewed: 10/30/2012 Elsevier Interactive Patient Education  2017 [employer]

## 2016-02-06 ENCOUNTER — Ambulatory Visit (HOSPITAL_COMMUNITY): Payer: Medicaid Other

## 2016-02-07 ENCOUNTER — Ambulatory Visit: Payer: Self-pay

## 2016-02-08 ENCOUNTER — Other Ambulatory Visit: Payer: Self-pay | Admitting: Family Medicine

## 2016-02-12 ENCOUNTER — Ambulatory Visit: Payer: Self-pay

## 2016-02-13 ENCOUNTER — Ambulatory Visit: Payer: Self-pay

## 2016-02-15 ENCOUNTER — Other Ambulatory Visit: Payer: Self-pay | Admitting: Obstetrics & Gynecology

## 2016-02-22 ENCOUNTER — Telehealth: Payer: Self-pay | Admitting: *Deleted

## 2016-02-22 NOTE — Telephone Encounter (Signed)
Pt left message stating that she needs some help with her insulin pump. She stated that she needs "a list of stuff faxed". Please call back.

## 2016-02-27 NOTE — Telephone Encounter (Signed)
I called Darl PikesSusan back and we discussed she needs some information faxed to her [place]. I explained she will need to either come to our office or go to that office and sign a release of what information she needs faxed and where. She voices understanding.

## 2016-03-05 ENCOUNTER — Ambulatory Visit (INDEPENDENT_AMBULATORY_CARE_PROVIDER_SITE_OTHER): Payer: Medicaid Other | Admitting: Certified Nurse Midwife

## 2016-03-05 ENCOUNTER — Encounter: Payer: Self-pay | Admitting: Certified Nurse Midwife

## 2016-03-05 DIAGNOSIS — Z3009 Encounter for other general counseling and advice on contraception: Secondary | ICD-10-CM

## 2016-03-05 DIAGNOSIS — E109 Type 1 diabetes mellitus without complications: Secondary | ICD-10-CM

## 2016-03-05 DIAGNOSIS — I10 Essential (primary) hypertension: Secondary | ICD-10-CM

## 2016-03-05 NOTE — Progress Notes (Signed)
Subjective:     Molly BloomSusan Savage is a 26 y.o. female who presents for a postpartum visit. She is 4 weeks postpartum following a low cervical transverse Cesarean section. I have fully reviewed the prenatal and intrapartum course. The delivery was at 38.5 gestational weeks. Outcome: primary cesarean section, low transverse incision. Anesthesia: spinal. Postpartum course has been unremarkable. Baby's course has been unremarkable. Baby is feeding by bottle Rush Barer- Gerber Extensive Hypoallergenic . Bleeding no bleeding. Bowel function is normal. Bladder function is normal. Patient is sexually active. Contraception method is none. Patient is interested in Paragard. Depression/anxiety screening: negative.  The following portions of the patient's history were reviewed and updated as appropriate: allergies, current medications, past family history, past medical history, past social history, past surgical history and problem list.  Review of Systems Pertinent items are noted in HPI.   Objective:    BP 119/83   Pulse (!) 102   Ht 5\' 8"  (1.727 m)   Wt 263 lb (119.3 kg)   Breastfeeding? No   BMI 39.99 kg/m   General:  alert, cooperative and no distress   Breasts:    Lungs: nml rate and effort  Heart:    Abdomen: normal findings: soft, non-tender, low transverse incision well healed, no erythema or edema   Vulva:    Vagina:   Cervix:    Corpus:   Adnexa:    Rectal Exam:         Assessment:      Normal postpartum exam. Pap smear not done at today's visit.  Chronic HTN-stable on HCTZ Type I DM Plan:      1. Contraception: IUD, Liletta-had unprotected IC last week, bring back for UPT next week and placement 2. Establish care with PCP- continue HCTZ until then 3. Follow up in: 1 week or as needed.   4. Follow up with Endocrinologist ([place]) for DM and possible insulin pump

## 2016-03-11 ENCOUNTER — Encounter: Payer: Self-pay | Admitting: Obstetrics and Gynecology

## 2016-03-11 ENCOUNTER — Ambulatory Visit (INDEPENDENT_AMBULATORY_CARE_PROVIDER_SITE_OTHER): Payer: Medicaid Other | Admitting: Obstetrics and Gynecology

## 2016-03-11 VITALS — BP 140/71 | HR 85

## 2016-03-11 DIAGNOSIS — Z30430 Encounter for insertion of intrauterine contraceptive device: Secondary | ICD-10-CM | POA: Diagnosis not present

## 2016-03-11 LAB — POCT PREGNANCY, URINE: Preg Test, Ur: NEGATIVE

## 2016-03-11 MED ORDER — LEVONORGESTREL 18.6 MCG/DAY IU IUD
INTRAUTERINE_SYSTEM | Freq: Once | INTRAUTERINE | Status: AC
  Administered 2016-03-11: 16:00:00 via INTRAUTERINE

## 2016-03-11 NOTE — Procedures (Addendum)
Intrauterine Device (IUD) Insertion Procedure Note  After a recent pap (results: negative/date: 06/2015) and recent negative gonorrhea-chlamydia testing (date: 12/2015) was confirmed, written consent was obtained; her urine pregnancy test was: negative The patient understands the risks of IUD placement, which include but are not limited to: bleeding, infection, uterine perforation, risk of expulsion, risk of failure < 1%, increased risk of ectopic pregnancy in the event of failure. Patient had bleeding that started a few days ago after lochia stopped about 3wks ago; pt not breastfeeding so told was likely her menses. [facility].   Prior to the procedure being performed, the patient (or guardian) was asked to state their full name, date of birth, and the type of procedure being performed. A bimanual exam showed the uterus to be midposition.  Next, the cervix and vagina were cleaned with an antiseptic solution, and the cervix was grasped with a tenaculum. Unable to get past internal os with sound so os finders used and internal os easily entered. The cervix was re-prepped with betadine, and the uterus was sounded to 8 cm.  The Liletta was placed without difficulty in the usual fashion.  The strings were cut to 3-4 cm.  The tenaculum was removed and cervix was found to be hemostatic after application of silver nitrate  No complications, patient tolerated the procedure well.  c-section incision inspected and noted to be c/d/i and healing great and nttp and no hernias/masses noted. Pt has some left side incision stabbing occasionally. Pt told likely due to nerves re-healing and should resolve on it's own within 6-4169m of surgery.   Patient states she has regular medicaid and is trying to decide on a PCP. Importance of primary care d/w pt.   RTC 7023m for IUD check. Pt told to consider it effective in 10d  Cornelia Copaharlie Nikiyah Fackler, Jr MD Attending Center for [facility](Faculty  Practice)

## 2016-03-20 ENCOUNTER — Telehealth: Payer: Self-pay | Admitting: *Deleted

## 2016-03-20 NOTE — Telephone Encounter (Signed)
Pt left message yesterday stating that she had an IUD placed last week and wants to know if she can use tampons. If so, she also wants to know if it is supposed to hurt.

## 2016-03-21 ENCOUNTER — Ambulatory Visit (INDEPENDENT_AMBULATORY_CARE_PROVIDER_SITE_OTHER): Payer: Medicaid Other | Admitting: Family Medicine

## 2016-03-21 ENCOUNTER — Telehealth: Payer: Self-pay | Admitting: Family Medicine

## 2016-03-21 ENCOUNTER — Encounter: Payer: Self-pay | Admitting: Family Medicine

## 2016-03-21 VITALS — BP 119/84 | HR 104 | Temp 98.1°F | Ht 68.0 in | Wt 262.0 lb

## 2016-03-21 DIAGNOSIS — L729 Follicular cyst of the skin and subcutaneous tissue, unspecified: Secondary | ICD-10-CM

## 2016-03-21 DIAGNOSIS — L089 Local infection of the skin and subcutaneous tissue, unspecified: Secondary | ICD-10-CM

## 2016-03-21 DIAGNOSIS — O139 Gestational [pregnancy-induced] hypertension without significant proteinuria, unspecified trimester: Secondary | ICD-10-CM

## 2016-03-21 DIAGNOSIS — L84 Corns and callosities: Secondary | ICD-10-CM

## 2016-03-21 LAB — HM DIABETES EYE EXAM

## 2016-03-21 MED ORDER — CEPHALEXIN 500 MG PO CAPS: 500.0000 mg | ORAL_CAPSULE | Freq: Three times a day (TID) | ORAL | 0 refills | Status: AC

## 2016-03-21 NOTE — Progress Notes (Signed)
Pre visit review using our clinic review tool, if applicable. No additional management support is needed unless otherwise documented below in the visit note. 

## 2016-03-21 NOTE — Patient Instructions (Addendum)
Take 1/2 tab of hydrochlorothiazide for the next week. After one week, OK to go off altogether after this.  Consider getting a BP monitor/cuff at home to check your blood pressure.  Write down the blood pressure numbers and bring them to your nurse visit.  If the area on the back of your R leg does not improve, schedule appointment.  Try to find out if you have received a pneumonia vaccine in the past.   Stop smoking.

## 2016-03-21 NOTE — Telephone Encounter (Signed)
Relation to ZO:XWRUpt:self Call back number:[phone] [facility]: [facility] [address] - [place], [place] - [address] [phone] (Phone) [phone] (Fax)     Reason for call:  Patient states she forgot to speak with Dr. Carmelia RollerWendling regarding her depression,patient states she mentioned her depression to the nurse patient seeking Rx, please advise

## 2016-03-21 NOTE — Progress Notes (Signed)
Chief Complaint  Patient presents with  . Establish Care    Wants a Dermatology referral. Needs to discuss B/P from recent preganncy. Blister on the back of R ankle. Callous on L foot.       New Patient Visit SUBJECTIVE: HPI: Molly Savage is an 26 y.o.female who is being seen for establishing care.  The patient [facility] ago.  Recently just had a child in January and an IUD was placed. She has DM I and is seen by endocrinology.She is able to get a tetanus shot through the pharmacy. She is unsure if she has never received a pneumonia shot (current cigarette smoker).  The patient was diagnosed with hypertension of pregnancy and placed on hydrochlorothiazide. Her OB/GYN is not willing to take her off of this without the supervision of a primary care physician. She does not check her blood pressures at home. She has been more physically active since giving birth. Her diet could be better. She is tolerating hydrochlorothiazide well.  She also has 2 months of a red and sore lesion on the back of her R heel. It is above the area where her skin meets her shoe. She notes that it feels like there is fluid in it today. It does not appear to be spreading in size or redness. Nothing drains from it. No fevers.  She also has a large calyceal side of her left foot. [place], however she was told that her A1c was too high to do anything about the callus. She would like to see a specialist around town.  No Known Allergies  Past Medical History:  Diagnosis Date  . Diabetes mellitus type 1 (HCC)    Follows with Endo  . Tobacco abuse    Past Surgical History:  Procedure Laterality Date  . CESAREAN SECTION N/A 02/01/2016   Procedure: CESAREAN SECTION;  Surgeon: Catalina AntiguaPeggy Constant, MD;  Location: [facility];  Service: Obstetrics;  Laterality: N/A;   Social History   Social History  . Marital status:  Significant Other   Social History Main Topics  . Smoking status: Current Every Day Smoker    Packs/day: 0.50    Years: 12.00    Types: Cigarettes  . Smokeless tobacco: Never Used  . Alcohol use No  . Drug use: No  . Sexual activity: Yes    Birth control/ protection: IUD   Family History  Problem Relation Age of Onset  . Hypertension Mother   . Bipolar disorder Sister   . Heart disease Maternal Uncle   . ADD / ADHD Brother      Current Outpatient Prescriptions:  .  acetaminophen (TYLENOL) 500 MG tablet, Take 2,000 mg by mouth every 6 (six) hours as needed for moderate pain. , Disp: , Rfl:  .  hydrochlorothiazide (HYDRODIURIL) 25 MG tablet, Take 1 tablet (25 mg total) by mouth daily., Disp: 30 tablet, Rfl: 3 .  ibuprofen (ADVIL,MOTRIN) 600 MG tablet, Take 1 tablet (600 mg total) by mouth every 6 (six) hours., Disp: 30 tablet, Rfl: 0 .  insulin aspart (NOVOLOG) 100 UNIT/ML injection, Inject 10-40 Units into the skin 3 (three) times daily before meals. Pt self adjusts her doses based on what she is eating., Disp: , Rfl:  .  insulin detemir (LEVEMIR) 100 UNIT/ML injection, Inject 1.1 mLs (110 Units total) into the skin at bedtime. Take 100 units of insulin at bedtime, Disp: 3 vial,  Rfl: 12 .  Insulin Syringe-Needle U-100 (SAFETY INSULIN SYRINGES) 27G X 1/2" 1 ML MISC, 1 each by Does not apply route 4 (four) times daily., Disp: 100 each, Rfl: 5 .  omeprazole (PRILOSEC) 20 MG capsule, Take 20 mg by mouth 2 (two) times daily before a meal., Disp: , Rfl:  .  Prenatal Vit-Fe Fumarate-FA (PRENATAL VITAMIN PO), Take 2 tablets by mouth daily. , Disp: , Rfl:  .  cephALEXin (KEFLEX) 500 MG capsule, Take 1 capsule (500 mg total) by mouth 3 (three) times daily., Disp: 30 capsule, Rfl: 0  No LMP recorded. Patient is not currently having periods (Reason: IUD).  ROS Cardiovascular: Denies chest pain  Respiratory: Denies dyspnea   OBJECTIVE: BP 119/84 (BP Location: Left Arm, Patient Position:  Sitting, Cuff Size: Large)   Pulse (!) 104   Temp 98.1 F (36.7 C) (Oral)   Ht 5\' 8"  (1.727 m)   Wt 262 lb (118.8 kg)   SpO2 97% Comment: RA  BMI 39.84 kg/m   Constitutional: -  VS reviewed -  Well developed, well nourished, appears stated age -  No apparent distress  Psychiatric: -  Oriented to person, place, and time -  Memory intact -  Affect and mood normal -  Fluent conversation, good eye contact -  Judgment and insight age appropriate  Eye: -  Conjunctivae clear, no discharge -  Pupils symmetric, round, reactive to light  ENMT: -  Oral mucosa without lesions, tongue and uvula midline    Tonsils not enlarged, no erythema, no exudate, trachea midline    Pharynx moist, no lesions, no erythema  Neck: -  No gross swelling, no palpable masses -  Thyroid midline, not enlarged, mobile, no palpable masses  Cardiovascular: -  RRR, no murmurs -  No LE edema  Respiratory: -  Normal respiratory effort, no accessory muscle use, no retraction -  Breath sounds equal, no wheezes, no ronchi, no crackles  Musculoskeletal: -  No clubbing, no cyanosis -  Gait normal  Skin: -  Circular and raised lesion in the middle of the distal 1/3 of tibia posteriorly, erythematous, TTP, some fluctuance appreciated, no streaking or drainage/opening -  On the L foot, there is a large callous on the lateral distal portion of the foot, very minimal presence over the plantar surface.   ASSESSMENT/PLAN: Hypertension of pregnancy, transient, delivered  Callus of foot - Plan: Ambulatory referral to Podiatry  Infected cyst of skin - Plan: cephALEXin (KEFLEX) 500 MG capsule  Patient instructed to sign release of records form from her previous PCP. To find out if she needs PCV23.  Will get Tdap at pharmacy as her insurance will cover it. Ok to decrease HCTZ to 1/2 tab daily for 1 week and then go off for 1 week. I would like for her ot keep her BP's and write them down during this time. Return for nurse visit in  2 weeks to see if we need to go back on something. Would likely try Norvasc or Lisinopril as she has an IUD.  Refer to podiatry for callous, she may benefit from inserts. Keflex for skin issue, if no improvement, come back, we may need to open area up.  Encouraged to stop smoking. The patient voiced understanding and agreement to the plan.   Jilda Roche [place], DO 03/21/16  4:49 PM

## 2016-03-22 NOTE — Telephone Encounter (Signed)
Have her schedule appt at her earliest convenience to discuss. TY.

## 2016-03-22 NOTE — Telephone Encounter (Signed)
Called and spoke with the pt and informed her of the message below.  Pt verbalized understanding and agreed.  Pt was scheduled an appt with Dr. Carmelia RollerWendling on (Mon-03/25/16 @ 3:15pm).//AB/CMA

## 2016-03-25 ENCOUNTER — Telehealth: Payer: Self-pay | Admitting: Family Medicine

## 2016-03-25 ENCOUNTER — Ambulatory Visit: Payer: Self-pay | Admitting: Family Medicine

## 2016-03-25 DIAGNOSIS — Z0289 Encounter for other administrative examinations: Secondary | ICD-10-CM

## 2016-03-25 NOTE — Telephone Encounter (Signed)
Patient lvm cancelling 3:15pm follow up for depression due to patient moving, [facility], charge or no charge

## 2016-03-25 NOTE — Telephone Encounter (Signed)
Charge. 

## 2016-03-26 ENCOUNTER — Encounter: Payer: Self-pay | Admitting: Family Medicine

## 2016-03-26 ENCOUNTER — Telehealth: Payer: Self-pay

## 2016-03-26 NOTE — Telephone Encounter (Signed)
Pt called on 03/26/16 and stated that she has an IUD placed and has been bleeding since is that normal?

## 2016-03-26 NOTE — Telephone Encounter (Signed)
Opened in Error.

## 2016-03-28 ENCOUNTER — Encounter: Payer: Self-pay | Admitting: Family Medicine

## 2016-03-28 ENCOUNTER — Ambulatory Visit: Payer: Self-pay | Admitting: Family Medicine

## 2016-04-01 NOTE — Telephone Encounter (Signed)
I called Molly Savage back and she reports she has been bleeding since she got her iud 03/11/16 - was on period at the times. States it stops sometimes for less than a day , then starts back- with clots at times. We discussed this is normal for the first few months as her body adjusts to the iud and should get better within the next few months.  We discussed she should keep her followup appt for iud check and can discuss with provider then. She voices understanding.

## 2016-04-04 ENCOUNTER — Ambulatory Visit: Payer: Self-pay | Admitting: Family Medicine

## 2016-04-04 ENCOUNTER — Ambulatory Visit (INDEPENDENT_AMBULATORY_CARE_PROVIDER_SITE_OTHER): Payer: Medicaid Other | Admitting: Family Medicine

## 2016-04-04 ENCOUNTER — Encounter: Payer: Self-pay | Admitting: Family Medicine

## 2016-04-04 VITALS — BP 128/78 | HR 111 | Temp 97.7°F | Ht 68.0 in | Wt 268.8 lb

## 2016-04-04 DIAGNOSIS — F339 Major depressive disorder, recurrent, unspecified: Secondary | ICD-10-CM | POA: Diagnosis not present

## 2016-04-04 MED ORDER — SERTRALINE HCL 50 MG PO TABS: 50.0000 mg | ORAL_TABLET | Freq: Every day | ORAL | 1 refills | Status: DC

## 2016-04-04 NOTE — Patient Instructions (Addendum)
In case you chance your mind, contact [phone] to schedule an appointment or inquire about cost/insurance coverage.  Healthy Eating Plan Many factors influence your heart health, including eating and exercise habits. Heart (coronary) risk increases with abnormal blood fat (lipid) levels. Heart-healthy meal planning includes limiting unhealthy fats, increasing healthy fats, and making other small dietary changes. This includes maintaining a healthy body weight to help keep lipid levels within a normal range.  WHAT IS MY PLAN?  Your health care provider recommends that you:  Drink a glass of water before meals to help with satiety.  Eat slowly.  An alternative to the water is to add Metamucil. This will help with satiety as well. It does contain calories, unlike water.  WHAT TYPES OF FAT SHOULD I CHOOSE?  Choose healthy fats more often. Choose monounsaturated and polyunsaturated fats, such as olive oil and canola oil, flaxseeds, walnuts, almonds, and seeds.  Eat more omega-3 fats. Good choices include salmon, mackerel, sardines, tuna, flaxseed oil, and ground flaxseeds. Aim to eat fish at least two times each week.  Avoid foods with partially hydrogenated oils in them. These contain trans fats. Examples of foods that contain trans fats are stick margarine, some tub margarines, cookies, crackers, and other baked goods. If you are going to avoid a fat, this is the one to avoid!  WHAT GENERAL GUIDELINES DO I NEED TO FOLLOW?  Check food labels carefully to identify foods with trans fats. Avoid these types of options when possible.  Fill one half of your plate with vegetables and green salads. Eat 4-5 servings of vegetables per day. A serving of vegetables equals 1 cup of raw leafy vegetables,  cup of raw or cooked cut-up vegetables, or  cup of vegetable juice.  Fill one fourth of your plate with whole grains. Look for the word "whole" as the first word in the ingredient list.  Fill one  fourth of your plate with lean protein foods.  Eat 4-5 servings of fruit per day. A serving of fruit equals one medium whole fruit,  cup of dried fruit,  cup of fresh, frozen, or canned fruit. Try to avoid fruits in cups/syrups as the sugar content can be high.  Eat more foods that contain soluble fiber. Examples of foods that contain this type of fiber are apples, broccoli, carrots, beans, peas, and barley. Aim to get 20-30 g of fiber per day.  Eat more home-cooked food and less restaurant, buffet, and fast food.  Limit or avoid alcohol.  Limit foods that are high in starch and sugar.  Avoid fried foods when able.  Cook foods by using methods other than frying. Baking, boiling, grilling, and broiling are all great options. Other fat-reducing suggestions include: ? Removing the skin from poultry. ? Removing all visible fats from meats. ? Skimming the fat off of stews, soups, and gravies before serving them. ? Steaming vegetables in water or broth.  Lose weight if you are overweight. Losing just 5-10% of your initial body weight can help your overall health and prevent diseases such as diabetes and heart disease.  Increase your consumption of nuts, legumes, and seeds to 4-5 servings per week. One serving of dried beans or legumes equals  cup after being cooked, one serving of nuts equals 1 ounces, and one serving of seeds equals  ounce or 1 tablespoon.  WHAT ARE GOOD FOODS CAN I EAT? Grains Grainy breads (try to find bread that is 3 g of fiber per slice or greater), oatmeal,  light popcorn. Whole-grain cereals. Rice and pasta, including brown rice and those that are made with whole wheat. Edamame pasta is a great alternative to grain pasta. It has a higher protein content. Try to avoid significant consumption of white bread, sugary cereals, or pastries/baked goods.  Vegetables All vegetables. Cooked white potatoes do not count as vegetables.  Fruits All fruits, but limit pineapple  and bananas as these fruits have a higher sugar content.  Meats and Other Protein Sources Lean, well-trimmed beef, veal, pork, and lamb. Chicken and [place] without skin. All fish and shellfish. Wild duck, rabbit, pheasant, and venison. Egg whites or low-cholesterol egg substitutes. Dried beans, peas, lentils, and tofu.Seeds and most nuts.  Dairy Low-fat or nonfat cheeses, including ricotta, string, and mozzarella. Skim or 1% milk that is liquid, powdered, or evaporated. Buttermilk that is made with low-fat milk. Nonfat or low-fat yogurt. Soy/Almond milk are good alternatives if you cannot handle dairy.  Beverages Water is the best for you. Sports drinks with less sugar are more desirable unless you are a highly active athlete.  Sweets and Desserts Sherbets and fruit ices. Honey, jam, marmalade, jelly, and syrups. Dark chocolate.  Eat all sweets and desserts in moderation.  Fats and Oils Nonhydrogenated (trans-free) margarines. Vegetable oils, including soybean, sesame, sunflower, olive, peanut, safflower, corn, canola, and cottonseed. Salad dressings or mayonnaise that are made with a vegetable oil. Limit added fats and oils that you use for cooking, baking, salads, and as spreads.  Other Cocoa powder. Coffee and tea. Most condiments.  The items listed above may not be a complete list of recommended foods or beverages. Contact your dietitian for more options.

## 2016-04-04 NOTE — Progress Notes (Signed)
Pre visit review using our clinic review tool, if applicable. No additional management support is needed unless otherwise documented below in the visit note. 
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
## 2016-04-09 ENCOUNTER — Ambulatory Visit (INDEPENDENT_AMBULATORY_CARE_PROVIDER_SITE_OTHER): Payer: Medicaid Other | Admitting: Certified Nurse Midwife

## 2016-04-09 ENCOUNTER — Encounter: Payer: Self-pay | Admitting: Certified Nurse Midwife

## 2016-04-09 VITALS — BP 129/76 | HR 87 | Ht 68.0 in | Wt 265.7 lb

## 2016-04-09 DIAGNOSIS — Z30431 Encounter for routine checking of intrauterine contraceptive device: Secondary | ICD-10-CM

## 2016-04-09 NOTE — Progress Notes (Signed)
Subjective:     Molly Savage is a 26 y.o. female here for a string check.  Current complaints: irregular bleeding. Has resumed IC, no pain.    Gynecologic History No LMP recorded. Patient is not currently having periods (Reason: IUD). Contraception: IUD   Obstetric History OB History  Gravida Para Term Preterm AB Living  1 1 1     1   SAB TAB Ectopic Multiple Live Births        0 1    # Outcome Date GA Lbr Len/2nd Weight Sex Delivery Anes PTL Lv  1 Term 02/01/16 [id number]  6 lb 1.7 oz (2.77 kg) M CS-LTranv EPI  LIV       The following portions of the patient's history were reviewed and updated as appropriate: allergies, current medications, past family history, past medical history, past social history, past surgical history and problem list.  Review of Systems Genitourinary:positive for irregular bleeding, negative for sexual problems    Objective:    BP 129/76   Pulse 87   Ht 5\' 8"  (1.727 m)   Wt 265 lb 11.2 oz (120.5 kg)   Breastfeeding? No   BMI 40.40 kg/m  General appearance: alert, cooperative and no distress Lungs: nml effort and rate Heart: nml rate Pelvic: cervix normal in appearance, external genitalia normal and strings present Neurologic: Grossly normal    Assessment:  IUD surveillance   Plan:  Reassurrance bleeding profile should improve by 3-6 mos Follow up as needed

## 2016-05-16 ENCOUNTER — Ambulatory Visit: Payer: Self-pay | Admitting: Family Medicine

## 2016-05-30 ENCOUNTER — Ambulatory Visit (INDEPENDENT_AMBULATORY_CARE_PROVIDER_SITE_OTHER): Payer: Medicaid Other | Admitting: Family Medicine

## 2016-05-30 ENCOUNTER — Encounter: Payer: Self-pay | Admitting: Family Medicine

## 2016-05-30 VITALS — BP 120/88 | HR 89 | Temp 98.4°F | Ht 68.0 in | Wt 264.0 lb

## 2016-05-30 DIAGNOSIS — F32A Depression, unspecified: Secondary | ICD-10-CM

## 2016-05-30 DIAGNOSIS — F419 Anxiety disorder, unspecified: Secondary | ICD-10-CM

## 2016-05-30 DIAGNOSIS — F329 Major depressive disorder, single episode, unspecified: Secondary | ICD-10-CM | POA: Diagnosis not present

## 2016-05-30 MED ORDER — SERTRALINE HCL 50 MG PO TABS: 75.0000 mg | ORAL_TABLET | Freq: Every day | ORAL | 1 refills | Status: DC

## 2016-05-30 NOTE — Patient Instructions (Signed)
Take 1.5 tabs of the Zoloft daily.  Please consider counseling. The medical literature and evidence-based guidelines support it. Contact [phone] to schedule an appointment or inquire about cost/insurance coverage.
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
## 2016-06-04 ENCOUNTER — Encounter (HOSPITAL_COMMUNITY): Payer: Self-pay | Admitting: Nurse Practitioner

## 2016-06-04 ENCOUNTER — Emergency Department (HOSPITAL_COMMUNITY)
Admission: EM | Admit: 2016-06-04 | Discharge: 2016-06-04 | Disposition: A | Discharge status: Home / Self Care | Payer: Medicaid Other | Attending: Emergency Medicine | Admitting: Emergency Medicine

## 2016-06-04 ENCOUNTER — Emergency Department (HOSPITAL_COMMUNITY): Payer: Medicaid Other

## 2016-06-04 DIAGNOSIS — Z794 Long term (current) use of insulin: Secondary | ICD-10-CM | POA: Insufficient documentation

## 2016-06-04 DIAGNOSIS — I10 Essential (primary) hypertension: Secondary | ICD-10-CM | POA: Insufficient documentation

## 2016-06-04 DIAGNOSIS — M79671 Pain in right foot: Secondary | ICD-10-CM | POA: Insufficient documentation

## 2016-06-04 DIAGNOSIS — E119 Type 2 diabetes mellitus without complications: Secondary | ICD-10-CM | POA: Diagnosis not present

## 2016-06-04 DIAGNOSIS — F17210 Nicotine dependence, cigarettes, uncomplicated: Secondary | ICD-10-CM | POA: Diagnosis not present

## 2016-06-04 DIAGNOSIS — Z79899 Other long term (current) drug therapy: Secondary | ICD-10-CM | POA: Diagnosis not present

## 2016-06-04 MED ORDER — DICLOFENAC SODIUM 50 MG PO TBEC: 50.0000 mg | DELAYED_RELEASE_TABLET | Freq: Two times a day (BID) | ORAL | 0 refills | Status: DC

## 2016-06-04 NOTE — Progress Notes (Deleted)
[facility] [address] [place], [place] [address] [phone]  Date:  06/05/2016   Name:  Molly Savage   DOB:  1990/08/27   MRN:  [id number]  PCP:  Shelda Pal, DO    Chief Complaint: No chief complaint on file.   History of Present Illness:  Molly Savage is a 26 y.o. very pleasant female patient who presents with the following:  Pt of Dr. Nani Ravens who I have not seen in the past. She has a history of obesity, DM1.  Seen by her PCP about one week ago to discuss depression and anxiety.  Here today   Patient Active Problem List   Diagnosis Date Noted  . Lactose intolerance 12/26/2015  . BMI 40.0-44.9, adult ([place]) 09/18/2015  . Anxiety 07/30/2015  . Depression 07/30/2015  . Diabetic foot infection ([place]) 04/17/2015  . 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 Tobacco abuse   . Chronic hypertension 08/24/2014    Past Medical History:  Diagnosis Date  . Diabetes mellitus type 1 ([place])    Follows with Endo  . Tobacco abuse     Past Surgical History:  Procedure Laterality Date  . CESAREAN SECTION N/A 02/01/2016   Procedure: CESAREAN SECTION;  Surgeon: Mora Bellman, MD;  Location: [facility];  Service: Obstetrics;  Laterality: N/A;    Social History  Substance Use Topics  . Smoking status: Current Every Day Smoker    Packs/day: 0.50    Years: 12.00    Types: Cigarettes  . Smokeless tobacco: Never Used  . Alcohol use No    Family History  Problem Relation Age of Onset  . Hypertension Mother   . Bipolar disorder Sister   . Heart disease Maternal Uncle   . ADD / ADHD Brother     No Known Allergies  Medication list has been reviewed and updated.  Current Outpatient Prescriptions on File Prior to Visit  Medication Sig Dispense Refill  . acetaminophen (TYLENOL) 500 MG tablet Take 2,000 mg by mouth every 6 (six) hours as needed for moderate pain.      Marland Kitchen ibuprofen (ADVIL,MOTRIN) 600 MG tablet Take 1 tablet (600 mg total) by mouth every 6 (six) hours. 30 tablet 0  . insulin aspart (NOVOLOG) 100 UNIT/ML injection Use as directed in insulin pump. MDD = 125 units. DX. E10.65    . Insulin Infusion Pump (MINIMED 630G INSULIN PUMP) KIT by Does not apply route.    Marland Kitchen omeprazole (PRILOSEC) 20 MG capsule Take 20 mg by mouth 2 (two) times daily before a meal.    . sertraline (ZOLOFT) 50 MG tablet Take 1.5 tablets (75 mg total) by mouth daily. 45 tablet 1   No current facility-administered medications on file prior to visit.     Review of Systems:  ***  Physical Examination: There were no vitals filed for this visit. There were no vitals filed for this visit. There is no height or weight on file to calculate BMI. Ideal Body Weight:    ***  Assessment and Plan: ***  Signed Lamar Blinks, MD

## 2016-06-04 NOTE — ED Provider Notes (Signed)
[name] DEPT Provider Note   CSN: [id number] Arrival date & time: 06/04/16  1514  By signing my name below, I, Dora Sims, attest that this documentation has been prepared under the direction and in the presence of non-physician practitioner, [facility] M. Janit Bern, NP. Electronically Signed: Dora Sims, Scribe. 06/04/2016. 4:41 PM.  History   Chief Complaint Chief Complaint  Patient presents with  . Foot Pain   The history is provided by the patient. No language interpreter was used.  Foot Pain  This is a new problem. The current episode started yesterday. The problem occurs constantly. The problem has not changed since onset.Pertinent negatives include no chest pain, no abdominal pain, no headaches and no shortness of breath. The symptoms are aggravated by walking and standing. Nothing relieves the symptoms. She has tried nothing for the symptoms.    HPI Comments: Molly Savage is a 26 y.o. female with PMHx including DM and HTN who presents to the Emergency Department complaining of sudden onset, constant, dorsal right foot pain beginning yesterday afternoon. She states her foot pain presented while ambulating. Patient notes the pain radiates proximally into her right ankle. She endorses pain exacerbation with weight bearing and has been ambulatory with difficulty secondary to her right foot pain. No alleviating factors noted. No recent trauma to her right foot. No h/o right foot problems. Patient denies joint swelling, numbness/tingling, fevers, chills, or any other associated symptoms.  Past Medical History:  Diagnosis Date  . Diabetes mellitus type 1 ([place])    Follows with Endo  . Tobacco abuse     Patient Active Problem List   Diagnosis Date Noted  . Lactose intolerance 12/26/2015  . BMI 40.0-44.9, adult ([place]) 09/18/2015  . Anxiety 07/30/2015  . Depression 07/30/2015  . Diabetic foot infection ([place]) 04/17/2015  . 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 Tobacco abuse   . Chronic hypertension 08/24/2014    Past Surgical History:  Procedure Laterality Date  . CESAREAN SECTION N/A 02/01/2016   Procedure: CESAREAN SECTION;  Surgeon: Mora Bellman, MD;  Location: [facility];  Service: Obstetrics;  Laterality: N/A;    OB History    Gravida Para Term Preterm AB Living   _0 SAB TAB Ectopic Multiple Live Births         0 1       Home Medications    Prior to Admission medications   Medication Sig Start Date End Date Taking? Authorizing Provider  acetaminophen (TYLENOL) 500 MG tablet Take 2,000 mg by mouth every 6 (six) hours as needed for moderate pain.     [provider]  diclofenac (VOLTAREN) 50 MG EC tablet Take 1 tablet (50 mg total) by mouth 2 (two) times daily. 06/04/16   Ashley Murrain, NP  insulin aspart (NOVOLOG) 100 UNIT/ML injection Use as directed in insulin pump. MDD = 125 units. DX. E10.65 05/08/16   [provider]  Insulin Infusion Pump (MINIMED 630G INSULIN PUMP) KIT by Does not apply route.    [provider]  omeprazole (PRILOSEC) 20 MG capsule Take 20 mg by mouth 2 (two) times daily before a meal.    [provider]  sertraline (ZOLOFT) 50 MG tablet Take 1.5 tablets (75 mg total) by mouth daily. 05/30/16   Shelda Pal, DO    Family History Family History  Problem Relation Age of Onset  . Hypertension Mother   . Bipolar disorder Sister   .  Heart disease Maternal Uncle   . ADD / ADHD Brother     Social History Social History  Substance Use Topics  . Smoking status: Current Every Day Smoker    Packs/day: 0.50    Years: 12.00    Types: Cigarettes  . Smokeless tobacco: Never Used  . Alcohol use No     Allergies   Patient has no known allergies.   Review of Systems Review of Systems  Constitutional: Negative for chills and fever.  Respiratory: Negative for shortness of breath.   Cardiovascular: Negative for chest pain.    Gastrointestinal: Negative for abdominal pain.  Musculoskeletal: Positive for arthralgias and gait problem. Negative for joint swelling.  Neurological: Negative for numbness and headaches.  All other systems reviewed and are negative.  Physical Exam Updated Vital Signs BP 139/76   Pulse 89   Temp 98.1 F (36.7 C) (Oral)   Resp 17   SpO2 98%   Physical Exam  Constitutional: She is oriented to person, place, and time. She appears well-developed and well-nourished. No distress.  HENT:  Head: Normocephalic and atraumatic.  Eyes: Conjunctivae and EOM are normal.  Neck: Neck supple. No tracheal deviation present.  Cardiovascular: Normal rate.   Pulses:      Dorsalis pedis pulses are 2+ on the right side, and 2+ on the left side.  Pulmonary/Chest: Effort normal. No respiratory distress.  Musculoskeletal: Normal range of motion. She exhibits tenderness.  Pedal pulses are 2+. Tenderness to the dorsum of the right foot at the base of the toes extending to the lateral aspect. Plantar flexion and dorsiflexion on the right without difficulty. FROM of right ankle without pain.  Neurological: She is alert and oriented to person, place, and time.  Skin: Skin is warm and dry.  Psychiatric: She has a normal mood and affect. Her behavior is normal.  Nursing note and vitals reviewed.  ED Treatments / Results  Labs (all labs ordered are listed, but only abnormal results are displayed) Labs Reviewed - No data to display  Radiology Dg Foot Complete Right  Result Date: 06/04/2016 CLINICAL DATA:  Sudden onset dorsal right foot pain. No trauma reported. EXAM: RIGHT FOOT COMPLETE - 3+ VIEW COMPARISON:  None. FINDINGS: There is a healed fracture of the distal third metatarsal. No acute fractures are seen. IMPRESSION: No acute fracture or cause for the acute symptoms identified. Electronically Signed   By: Dorise Bullion III M.D   On: 06/04/2016 18:16    Procedures Procedures (including critical  care time)  DIAGNOSTIC STUDIES: Oxygen Saturation is 98% on RA, normal by my interpretation.    COORDINATION OF CARE: 4:40 PM Discussed treatment plan with pt at bedside and pt agreed to plan.  Medications Ordered in ED Medications - No data to display   Initial Impression / Assessment and Plan / ED Course  I have reviewed the triage vital signs and the nursing notes. Patient X-Ray negative for obvious fracture or dislocation.  Pt advised to follow up with PCP as scheduled tomorrow.  Patient given ace wrap while in ED, conservative therapy recommended and discussed. Patient will be discharged home & is agreeable with above plan. Returns precautions discussed. Pt appears safe for discharge. She remains neurovascularly intact.    Final Clinical Impressions(s) / ED Diagnoses   Final diagnoses:  Foot pain, right    New Prescriptions Discharge Medication List as of 06/04/2016  6:23 PM    START taking these medications   Details  diclofenac (VOLTAREN) 50  MG EC tablet Take 1 tablet (50 mg total) by mouth 2 (two) times daily., Starting Tue 06/04/2016, Print      I personally performed the services described in this documentation, which was scribed in my presence. The recorded information has been reviewed and is accurate.    Debroah Baller [place], [place] 06/04/16 Randol Kern    Virgel Manifold, MD 06/05/16 [phone]

## 2016-06-04 NOTE — ED Notes (Signed)
Pt back from x-ray.

## 2016-06-04 NOTE — ED Notes (Signed)
Patient transported to X-ray 

## 2016-06-04 NOTE — ED Triage Notes (Signed)
Pt presents with c/o R foot pain. The pain began today. The pain feels like a stabbing pain in the top of her foot near her toes. She denies any injuries to her foot.

## 2016-06-04 NOTE — ED Notes (Signed)
Declined W/C at D/C and was escorted to lobby by RN. 

## 2016-06-05 ENCOUNTER — Telehealth: Payer: Self-pay | Admitting: Family Medicine

## 2016-06-05 ENCOUNTER — Ambulatory Visit: Payer: Medicaid Other | Admitting: Family Medicine

## 2016-06-05 NOTE — Telephone Encounter (Signed)
No charge,  However as this is a chronic problem let's re-schedule with he PCP if she chooses to reschedule

## 2016-06-05 NOTE — Telephone Encounter (Signed)
Patient called at 11:10 stating she is still at an appointment with her son and can not make appointment at 11:15. Charge or No Charge?

## 2016-06-21 NOTE — Addendum Note (Signed)
Addendum  created 06/21/16 0840 by Shelton SilvasHollis, Orenthal Debski D, MD   Sign clinical note

## 2016-07-03 ENCOUNTER — Encounter: Payer: Self-pay | Admitting: Family Medicine

## 2016-07-03 ENCOUNTER — Ambulatory Visit (INDEPENDENT_AMBULATORY_CARE_PROVIDER_SITE_OTHER): Payer: Medicaid Other | Admitting: Family Medicine

## 2016-07-03 VITALS — BP 120/80 | HR 84 | Temp 98.0°F | Ht 68.0 in | Wt 274.0 lb

## 2016-07-03 DIAGNOSIS — M222X1 Patellofemoral disorders, right knee: Secondary | ICD-10-CM

## 2016-07-03 DIAGNOSIS — M222X2 Patellofemoral disorders, left knee: Secondary | ICD-10-CM

## 2016-07-03 NOTE — Patient Instructions (Addendum)
Knee Exercises It is normal to feel mild stretching, pulling, tightness, or discomfort as you do these exercises, but you should stop right away if you feel sudden pain or your pain gets worse.Do not begin these exercises until told by your health care provider. STRETCHING AND RANGE OF MOTION EXERCISES  These exercises warm up your muscles and joints and improve the movement and flexibility of your knee. These exercises also help to relieve pain, numbness, and tingling. Exercise A: Knee Extension, Prone  1. Lie on your abdomen on a bed. 2. Place your left / right knee just beyond the edge of the surface so your knee is not on the bed. You can put a towel under your left / right thigh just above your knee for comfort. 3. Relax your leg muscles and allow gravity to straighten your knee. You should feel a stretch behind your left / right knee. 4. Hold this position for 15-20 seconds. 5. Scoot up so your knee is supported between repetitions. Repeat 2-3 times. Complete this stretch 1 time a day. Exercise B: Knee Flexion, Active    1. Lie on your back with both knees straight. If this causes back discomfort, bend your left / right knee so your foot is flat on the floor. 2. Slowly slide your left / right heel back toward your buttocks until you feel a gentle stretch in the front of your knee or thigh. 3. Hold this position for 15-20 seconds. 4. Slowly slide your left / right heel back to the starting position. Repeat 2-3 times. Complete this exercise 1 time a day. Exercise C: Quadriceps, Prone    1. Lie on your abdomen on a firm surface, such as a bed or padded floor. 2. Bend your left / right knee and hold your ankle. If you cannot reach your ankle or pant leg, loop a belt around your foot and grab the belt instead. 3. Gently pull your heel toward your buttocks. Your knee should not slide out to the side. You should feel a stretch in the front of your thigh and knee. 4. Hold this position for  15-20 seconds. Repeat 2-3 times. Complete this stretch 1 time a day. Exercise D: Hamstring, Supine  1. Lie on your back. 2. Loop a belt or towel over the ball of your left / right foot. The ball of your foot is on the walking surface, right under your toes. 3. Straighten your left / right knee and slowly pull on the belt to raise your leg until you feel a gentle stretch behind your knee. ? Do not let your left / right knee bend while you do this. ? Keep your other leg flat on the floor. 4. Hold this position for 15-20 seconds. Repeat 2-3 times. Complete this stretch 1 time a day. STRENGTHENING EXERCISES  These exercises build strength and endurance in your knee. Endurance is the ability to use your muscles for a long time, even after they get tired. Exercise E: Quadriceps, Isometric    1. Lie on your back with your left / right leg extended and your other knee bent. Put a rolled towel or small pillow under your knee if told by your health care provider. 2. Slowly tense the muscles in the front of your left / right thigh. You should see your kneecap slide up toward your hip or see increased dimpling just above the knee. This motion will push the back of the knee toward the floor. 3. For 15 seconds, keep  the muscle as tight as you can without increasing your pain. 4. Relax the muscles slowly and completely. Repeat 1 times. Complete this exercise every other day. Exercise F: Straight Leg Raises - Quadriceps  1. Lie on your back with your left / right leg extended and your other knee bent. 2. Tense the muscles in the front of your left / right thigh. You should see your kneecap slide up or see increased dimpling just above the knee. Your thigh may even shake a bit. 3. Keep these muscles tight as you raise your leg 4-6 inches (10-15 cm) off the floor. Do not let your knee bend. 4. Hold this position for 15 seconds. 5. Keep these muscles tense as you lower your leg. 6. Relax your muscles slowly  and completely after each repetition. Repeat 2 times. Complete this exercise every other day.  Exercise G: Hamstring Curls    If told by your health care provider, do this exercise while wearing ankle weights. Begin with 5 lb weights (optional). Then increase the weight by 1 lb (0.5 kg) increments. Do not wear ankle weights that are more than 20 lbs to start with. 1. Lie on your abdomen with your legs straight. 2. Bend your left / right knee as far as you can without feeling pain. Keep your hips flat against the floor. 3. Hold this position for 10 seconds. 4. Slowly lower your leg to the starting position. Repeat 2 times. Complete this exercise every other day. Exercise H: Squats (Quadriceps)  1. Stand in front of a table, with your feet and knees pointing straight ahead. You may rest your hands on the table for balance but not for support. 2. Slowly bend your knees and lower your hips like you are going to sit in a chair. ? Keep your weight over your heels, not over your toes. ? Keep your lower legs upright so they are parallel with the table legs. ? Do not let your hips go lower than your knees. ? Do not bend lower than told by your health care provider. ? If your knee pain increases, do not bend as low. 3. Hold the squat position for 10 seconds. 4. Slowly push with your legs to return to standing. Do not use your hands to pull yourself to standing. Repeat 2 times. Complete this exercise every otherday. Exercise I: Wall Slides (Quadriceps)    1. Lean your back against a smooth wall or door while you walk your feet out 18-24 inches (46-61 cm) from it. 2. Place your feet hip-width apart. 3. Slowly slide down the wall or door until your knees Repeat 2 times. Complete this exercise every other day. 4. Exercise K: Straight Leg Raises - Hip Abductors  1. Lie on your side with your left / right leg in the top position. Lie so your head, shoulder, knee, and hip line up. You may bend your  bottom knee to help you keep your balance. 2. Roll your hips slightly forward so your hips are stacked directly over each other and your left / right knee is facing forward. 3. Leading with your heel, lift your top leg 4-6 inches (10-15 cm). You should feel the muscles in your outer hip lifting. ? Do not let your foot drift forward. ? Do not let your knee roll toward the ceiling. 4. Hold this position for 10-15 seconds. 5. Slowly return your leg to the starting position. 6. Let your muscles relax completely after each repetition. Repeat 1-2 times. Complete  this exercise every other. Exercise J: Straight Leg Raises - Hip Extensors  1. Lie on your abdomen on a firm surface. You can put a pillow under your hips if that is more comfortable. 2. Tense the muscles in your buttocks and lift your left / right leg about 4-6 inches (10-15 cm). Keep your knee straight as you lift your leg. 3. Hold this position for 10-15 seconds. 4. Slowly lower your leg to the starting position. 5. Let your leg relax completely after each repetition. Repeat 2 times. Complete this exercise every other day. This information is not intended to replace advice given to you by your health care provider. Make sure you discuss any questions you have with your health care provider. Document Released: 11/21/2004 Document Revised: 10/02/2015 Document Reviewed: 11/13/2014 Elsevier Interactive Patient Education  2017 [employer]  Try taking some Vitamin B complexes to see if this helps your memory.

## 2016-07-03 NOTE — Progress Notes (Signed)
Musculoskeletal Exam  Patient: Molly Savage DOB: 1990/04/06  DOS: 07/03/2016  SUBJECTIVE:  Chief Complaint:   Chief Complaint  Patient presents with  . Knee Pain    (B) x 2-3 weeks  . Trouble with memory    Molly Savage is a 26 y.o.  female for evaluation and treatment of b/l knee pain.   Onset:  3 weeks ago, Sudden, not related to any injury or change in activity Location: anterior knee behind caps, worse on L Character:  sharp  Worse with going up and down stairs, sitting for long periods of time Progression of issue:  is unchanged Associated symptoms: It will pop Denies swelling, bruising, catching/locking, or decreased range of motion Treatment: to date has been none.   Neurovascular symptoms: no  ROS: Musculoskeletal/Extremities: +knee pain Neurologic: no numbness, tingling no weakness   Past Medical History:  Diagnosis Date  . Diabetes mellitus type 1 ([place])    Follows with Endo  . Tobacco abuse    Past Surgical History:  Procedure Laterality Date  . CESAREAN SECTION N/A 02/01/2016   Procedure: CESAREAN SECTION;  Surgeon: Mora Bellman, MD;  Location: [place] [facility];  Service: Obstetrics;  Laterality: N/A;   Family History  Problem Relation Age of Onset  . Hypertension Mother   . Bipolar disorder Sister   . Heart disease Maternal Uncle   . ADD / ADHD Brother    Current Outpatient Prescriptions  Medication Sig Dispense Refill  . acetaminophen (TYLENOL) 500 MG tablet Take 2,000 mg by mouth every 6 (six) hours as needed for moderate pain.     Marland Kitchen diclofenac (VOLTAREN) 50 MG EC tablet Take 1 tablet (50 mg total) by mouth 2 (two) times daily. 15 tablet 0  . insulin aspart (NOVOLOG) 100 UNIT/ML injection Use as directed in insulin pump. MDD = 125 units. DX. E10.65    . Insulin Infusion Pump (MINIMED 630G INSULIN PUMP) KIT by Does not apply route.    Marland Kitchen omeprazole (PRILOSEC) 20 MG capsule Take 20 mg by mouth 2 (two) times daily before a meal.    .  sertraline (ZOLOFT) 50 MG tablet Take 1.5 tablets (75 mg total) by mouth daily. 45 tablet 1   No Known Allergies Social History   Social History  . Marital status: Significant Other   Social History Main Topics  . Smoking status: Current Every Day Smoker    Packs/day: 0.50    Years: 12.00    Types: Cigarettes  . Smokeless tobacco: Never Used  . Alcohol use No  . Drug use: No   Objective: VITAL SIGNS: BP 120/80 (BP Location: Left Arm, Patient Position: Sitting, Cuff Size: Normal)   Pulse 84   Temp 98 F (36.7 C) (Oral)   Ht _0  (1.727 m)   Wt 274 lb (124.3 kg)   LMP 07/03/2016 (Approximate)   SpO2 98%   BMI 41.66 kg/m  Constitutional: Well formed, well developed. No acute distress. Thorax & Lungs: No accessory muscle use Extremities: No clubbing. No cyanosis. No edema.  Skin: Warm. Dry. No erythema. No rash.  Musculoskeletal: R knee.   Normal active range of motion: yes.   Normal passive range of motion: yes Tenderness to palpation: no Deformity: no Ecchymosis: no Tests negative: Lachman's, posterior drawer, anterior drawer, varus/valgus, McMurray's, patellar grind, patellar apprehension L knee Nml active and passive ROM TTP over the patellar tendon No deformity or ecchymosis Tests neg: Lachman's, posterior drawer, anterior drawer, varus/valgus, McMurray's, patellar grind, patellar apprehension Unable to squat  Neurologic: Normal sensory function. No focal deficits noted.  Psychiatric: Normal mood. Age appropriate judgment and insight. Alert & oriented x 3.    Assessment:  Patellofemoral arthralgia of both knees  Plan: Home stretches and exercises given. Discussed briefly the pathophysiology. She briefly brought up her desire to take a medication for her memory. Recommended she try Vit B OTC to see if this helps. Will consider neuropsych evaluation to see if there is ADHD present or if this is a component of her depression. Follow-up in 3-4 weeks after doing  stretches/exercises, will consider physical therapy versus imaging if no improvement. The patient voiced understanding and agreement to the plan.   [place], DO 07/03/16  12:01 PM

## 2016-07-11 ENCOUNTER — Ambulatory Visit: Payer: Self-pay | Admitting: Family Medicine

## 2016-07-12 ENCOUNTER — Encounter: Payer: Self-pay | Admitting: Family Medicine

## 2016-07-12 ENCOUNTER — Ambulatory Visit (INDEPENDENT_AMBULATORY_CARE_PROVIDER_SITE_OTHER): Payer: Medicaid Other | Admitting: Family Medicine

## 2016-07-12 VITALS — BP 136/72 | HR 99 | Temp 98.0°F | Resp 16 | Wt 274.8 lb

## 2016-07-12 DIAGNOSIS — F329 Major depressive disorder, single episode, unspecified: Secondary | ICD-10-CM | POA: Diagnosis not present

## 2016-07-12 DIAGNOSIS — F419 Anxiety disorder, unspecified: Secondary | ICD-10-CM

## 2016-07-12 DIAGNOSIS — L84 Corns and callosities: Secondary | ICD-10-CM

## 2016-07-12 DIAGNOSIS — M25561 Pain in right knee: Secondary | ICD-10-CM | POA: Diagnosis not present

## 2016-07-12 DIAGNOSIS — M25562 Pain in left knee: Secondary | ICD-10-CM | POA: Diagnosis not present

## 2016-07-12 DIAGNOSIS — F32A Depression, unspecified: Secondary | ICD-10-CM

## 2016-07-12 NOTE — Patient Instructions (Signed)
We will reach out to you regarding podiatry.  If you do not hear anything about your referral in the next 1-2 weeks, call our office and ask for an update.  Let us know when you need refills.

## 2016-07-12 NOTE — Progress Notes (Signed)
Chief Complaint  Patient presents with  . Anxiety    P here for follow up. Pt is still having pain in knee     Subjective Molly Savage presents for f/u anxiety/depression.  Reports much improvement since treatemnt. Has tried Zoloft 75 mg daily. Has failed none. Reports healthy diet overall. Reports getting routine exercise. No thoughts of harming self or others. No self-medication with alcohol, prescription drugs or illicit drugs.  Still having b/l knee pain. Tried to do stretches and exercises, could not due to poor balance.  ROS Psych: No homicidal or suicidal thoughts  Past Medical History:  Diagnosis Date  . Diabetes mellitus type 1 ([place])    Follows with Endo  . Tobacco abuse    Family History  Problem Relation Age of Onset  . Hypertension Mother   . Bipolar disorder Sister   . Heart disease Maternal Uncle   . ADD / ADHD Brother    Allergies as of 07/12/2016   No Known Allergies     Medication List       Accurate as of 07/12/16  9:18 AM. Always use your most recent med list.          acetaminophen 500 MG tablet Commonly known as:  TYLENOL Take 2,000 mg by mouth every 6 (six) hours as needed for moderate pain.   insulin aspart 100 UNIT/ML injection Commonly known as:  novoLOG Use as directed in insulin pump. MDD = 125 units. DX. E10.65   MINIMED 630G INSULIN PUMP Kit by Does not apply route.   omeprazole 20 MG capsule Commonly known as:  PRILOSEC Take 20 mg by mouth 2 (two) times daily before a meal.   sertraline 50 MG tablet Commonly known as:  ZOLOFT Take 1.5 tablets (75 mg total) by mouth daily.       Exam BP 136/72 (BP Location: Right Arm, Patient Position: Sitting, Cuff Size: Large)   Pulse 99   Temp 98 F (36.7 C) (Oral)   Resp 16   Wt 274 lb 12.8 oz (124.6 kg)   LMP 07/03/2016 (Approximate)   SpO2 98%   BMI 41.78 kg/m  General:  well developed, well nourished, in no apparent distress Lungs:  No access muscle use MSK: No  TTP over knees, no effusion, some patellar clicking on flexion/extension, +ttp over distal hamstrings b/l but worse on R; neg Lachman's, patellar grind, McMurray's, varus/valgus stress Psych: well oriented with normal range of affect and age-appropriate judgement/insight, alert and oriented x4.  Assessment and Plan  Acute pain of both knees - Plan: Ambulatory referral to Physical Therapy  Anxiety and depression  Orders as above. Will try to refer to [facility] for podiatry. Cont Zoloft 75 mg daily. Counseled on diet and exercise affecting mood. F/u in 4 mo, if doing well then, will see every 6 mo or prn. The patient voiced understanding and agreement to the plan.  [place], DO 07/12/16 9:18 AM

## 2016-08-07 ENCOUNTER — Telehealth: Payer: Self-pay | Admitting: Family Medicine

## 2016-08-07 DIAGNOSIS — F329 Major depressive disorder, single episode, unspecified: Secondary | ICD-10-CM

## 2016-08-07 DIAGNOSIS — F419 Anxiety disorder, unspecified: Principal | ICD-10-CM

## 2016-08-07 DIAGNOSIS — F32A Depression, unspecified: Secondary | ICD-10-CM

## 2016-08-07 MED ORDER — OMEPRAZOLE 20 MG PO CPDR: 20.0000 mg | DELAYED_RELEASE_CAPSULE | Freq: Two times a day (BID) | ORAL | 1 refills | Status: DC

## 2016-08-07 MED ORDER — SERTRALINE HCL 50 MG PO TABS: 75.0000 mg | ORAL_TABLET | Freq: Every day | ORAL | 1 refills | Status: DC

## 2016-08-07 NOTE — Telephone Encounter (Signed)
Caller name: Relation to pt: self Call back number:[phone] Pharmacy:[facility] [place]  Reason for call: pt is needing refill on sertraline (ZOLOFT) 50 MG tablet, pt also states that she is no longer seeing the doctor that was prescribing her rx omeprazole (PRILOSEC) 20 MG capsule, states she also need that rx as well

## 2016-08-07 NOTE — Telephone Encounter (Signed)
Rx's sent to the pharmacy by e-script.//AB/CMA 

## 2016-08-28 ENCOUNTER — Ambulatory Visit (INDEPENDENT_AMBULATORY_CARE_PROVIDER_SITE_OTHER): Payer: Medicaid Other | Admitting: Family Medicine

## 2016-08-28 ENCOUNTER — Ambulatory Visit: Payer: Self-pay | Admitting: Family Medicine

## 2016-08-28 VITALS — BP 118/75 | HR 95 | Temp 97.9°F | Ht 68.0 in | Wt 281.2 lb

## 2016-08-28 DIAGNOSIS — F418 Other specified anxiety disorders: Secondary | ICD-10-CM

## 2016-08-28 MED ORDER — FLUOXETINE HCL 20 MG PO TABS: 20.0000 mg | ORAL_TABLET | Freq: Every day | ORAL | 3 refills | Status: DC

## 2016-08-28 NOTE — Progress Notes (Signed)
Chief Complaint  Patient presents with  . Follow-up    pt. here for medication management; she reported that the medication isn't very effective, the Zoloft seems to wear off midday; having bad thoughts while taking the medication    Subjective Molly Savage presents for f/u anxiety/depression.  Reports sudden worsening since 2-3 weeks ago- no triggers. Has been on Zoloft 75 mg daily. Reports unhealthy diet overall. Reports not getting routine exercise. No thoughts of harming self or others. No self-medication with alcohol, prescription drugs or illicit drugs. She was given contact info for counseling but lost the paper.   ROS Psych: No homicidal or suicidal thoughts  Past Medical History:  Diagnosis Date  . Diabetes mellitus type 1 ([place])    Follows with Endo  . Tobacco abuse    Family History  Problem Relation Age of Onset  . Hypertension Mother   . Bipolar disorder Sister   . Heart disease Maternal Uncle   . ADD / ADHD Brother    Allergies as of 08/28/2016   No Known Allergies     Medication List       Accurate as of 08/28/16  6:51 PM. Always use your most recent med list.          acetaminophen 500 MG tablet Commonly known as:  TYLENOL Take 2,000 mg by mouth every 6 (six) hours as needed for moderate pain.   FLUoxetine 20 MG tablet Commonly known as:  PROZAC Take 1 tablet (20 mg total) by mouth daily.   insulin aspart 100 UNIT/ML injection Commonly known as:  novoLOG Use as directed in insulin pump. MDD = 125 units. DX. E10.65   MINIMED 630G INSULIN PUMP Kit by Does not apply route.   omeprazole 20 MG capsule Commonly known as:  PRILOSEC Take 1 capsule (20 mg total) by mouth 2 (two) times daily before a meal.       Exam BP 118/75 (BP Location: Left Arm, Cuff Size: Large)   Pulse 95   Temp 97.9 F (36.6 C) (Oral)   Ht _0  (1.727 m)   Wt 281 lb 3.2 oz (127.6 kg)   SpO2 98%   BMI 42.76 kg/m  General:  well developed, well nourished, in no  apparent distress Lungs:  no respiratory distres Psych: well oriented with normal range of affect and age-appropriate judgement/insight, alert and oriented x4.  Assessment and Plan  Depression with anxiety - Plan: FLUoxetine (PROZAC) 20 MG tablet  Orders as above. Change Zoloft to Prozac. If this is not effective, will discuss Wellbutrin vs SNRI class.  She is to send [place] a MyChart message and I will reply with counseling and psych contact information. Our computer system went down during her visit. Counseled on diet and exercise affecting mood. F/u in in 6-8 weeks as she was originally scheduled. The patient voiced understanding and agreement to the plan.  [place], DO 08/28/16 6:51 PM

## 2016-08-28 NOTE — Progress Notes (Signed)
Pre visit review using our clinic review tool, if applicable. No additional management support is needed unless otherwise documented below in the visit note. 

## 2016-09-05 ENCOUNTER — Telehealth: Payer: Self-pay

## 2016-09-05 MED ORDER — FLUOXETINE HCL 20 MG PO CAPS: 20.0000 mg | ORAL_CAPSULE | Freq: Every day | ORAL | 3 refills | Status: DC

## 2016-09-05 NOTE — Telephone Encounter (Signed)
[facility] faxed a request for a Prior Authorization for the Fluoxetine 20mg  tablets/Can this be changed to Fluoxetine 20mg  Capsules as Medicaid covers the Capsules? Plz advise/thx dmf,rma

## 2016-09-05 NOTE — Telephone Encounter (Signed)
OK to change. TY . 

## 2016-09-05 NOTE — Telephone Encounter (Signed)
Per response from [facility]/Fluoxetine tabs deactivated and caps initiated and faxed to the pharmacy/thx dmf,rma

## 2016-09-11 ENCOUNTER — Ambulatory Visit (INDEPENDENT_AMBULATORY_CARE_PROVIDER_SITE_OTHER): Payer: Medicaid Other | Admitting: Obstetrics & Gynecology

## 2016-09-11 ENCOUNTER — Encounter: Payer: Self-pay | Admitting: Obstetrics & Gynecology

## 2016-09-11 DIAGNOSIS — Z30431 Encounter for routine checking of intrauterine contraceptive device: Secondary | ICD-10-CM | POA: Diagnosis not present

## 2016-09-11 DIAGNOSIS — Z975 Presence of (intrauterine) contraceptive device: Secondary | ICD-10-CM

## 2016-09-11 DIAGNOSIS — Z30430 Encounter for insertion of intrauterine contraceptive device: Secondary | ICD-10-CM | POA: Diagnosis not present

## 2016-09-11 MED ORDER — PARAGARD INTRAUTERINE COPPER IU IUD
INTRAUTERINE_SYSTEM | Freq: Once | INTRAUTERINE | Status: AC
  Administered 2016-09-11: 10:00:00 via INTRAUTERINE

## 2016-09-11 NOTE — Progress Notes (Signed)
     GYNECOLOGY OFFICE PROCEDURE NOTE  Kalayla Saccone is a 26 y.o. G1P1001 here for Liletta IUD removal. She has daily spotting and mood disturbance since insertion.  Last pap smear was on 06/2015 and was normal.  IUD Removal  Patient identified, informed consent performed, consent signed.  Patient was in the dorsal lithotomy position, normal external genitalia was noted.  A speculum was placed in the patient's vagina, normal discharge was noted, no lesions. The cervix was visualized, no lesions, no abnormal discharge.  The strings of the IUD were grasped and pulled using ring forceps. The IUD was removed in its entirety.   Patient tolerated the procedure well.     IUD Insertion Procedure Note Patient identified, informed consent performed, consent signed.   Discussed risks of irregular bleeding, cramping, infection, malpositioning or misplacement of the IUD outside the uterus which may require further procedure such as laparoscopy. Time out was performed.    Cervix visualized.  Cleaned with Betadine x 2.  Grasped anteriorly with a single tooth tenaculum.  Uterus sounded to 8 cm.  Pargard IUD placed per manufacturer's recommendations.  Strings trimmed to 3 cm. Tenaculum was removed, good hemostasis noted.  Patient tolerated procedure well.   Patient was given post-procedure instructions.   Patient was also asked to check IUD strings periodically and follow up in 4 weeks for IUD check.   Adam Phenix, MD Attending Obstetrician & Gynecologist, [facility] for [facility], [facility]

## 2016-09-11 NOTE — Patient Instructions (Signed)

## 2016-09-20 ENCOUNTER — Encounter: Payer: Self-pay | Admitting: General Practice

## 2016-10-10 ENCOUNTER — Ambulatory Visit: Payer: Self-pay | Admitting: Obstetrics & Gynecology

## 2016-10-14 ENCOUNTER — Ambulatory Visit (INDEPENDENT_AMBULATORY_CARE_PROVIDER_SITE_OTHER): Payer: Medicaid Other | Admitting: Obstetrics & Gynecology

## 2016-10-14 ENCOUNTER — Encounter: Payer: Self-pay | Admitting: Obstetrics & Gynecology

## 2016-10-14 VITALS — BP 121/79 | HR 98 | Ht 68.0 in | Wt 286.9 lb

## 2016-10-14 DIAGNOSIS — Z975 Presence of (intrauterine) contraceptive device: Secondary | ICD-10-CM

## 2016-10-14 DIAGNOSIS — Z30432 Encounter for removal of intrauterine contraceptive device: Secondary | ICD-10-CM | POA: Diagnosis not present

## 2016-10-14 DIAGNOSIS — F331 Major depressive disorder, recurrent, moderate: Secondary | ICD-10-CM

## 2016-10-14 MED ORDER — SERTRALINE HCL 100 MG PO TABS: 100.0000 mg | ORAL_TABLET | Freq: Every day | ORAL | 0 refills | Status: DC

## 2016-10-14 NOTE — Progress Notes (Signed)
Subjective:     Patient ID: Molly Savage, female   DOB: 04/19/1990, 26 y.o.   MRN: [id number] Cc: IUD check [id number] No LMP recorded. Patient is not currently having periods (Reason: IUD). S/p Paragard insertion 1 mo ago for f/u. NO abnormal discharge pain or bleeding  Current Outpatient Prescriptions on File Prior to Visit  Medication Sig Dispense Refill  . acetaminophen (TYLENOL) 500 MG tablet Take 2,000 mg by mouth every 6 (six) hours as needed for moderate pain.     . omeprazole (PRILOSEC) 20 MG capsule Take 1 capsule (20 mg total) by mouth 2 (two) times daily before a meal. 180 capsule 1  . FLUoxetine (PROZAC) 20 MG capsule Take 1 capsule (20 mg total) by mouth daily. (Patient not taking: Reported on 09/11/2016) 30 capsule 3  . insulin aspart (NOVOLOG) 100 UNIT/ML injection Use as directed in insulin pump. MDD = 125 units. DX. E10.65    . Insulin Infusion Pump (MINIMED 630G INSULIN PUMP) KIT by Does not apply route.     No current facility-administered medications on file prior to visit.      Review of Systems  Constitutional: Negative.   Gastrointestinal: Negative.   Endocrine:       Decreased libido  Genitourinary: Negative.        Objective:   Physical Exam  Constitutional: She is oriented to person, place, and time. She appears well-developed.  obese  Cardiovascular: Normal rate.   Pulmonary/Chest: Effort normal.  Genitourinary: Vagina normal. No vaginal discharge found.  Genitourinary Comments: String 2 cm at cx os  Neurological: She is alert and oriented to person, place, and time.  Psychiatric: She has a normal mood and affect. Her behavior is normal.  Vitals reviewed.      Assessment:     Normal IUD placement confirmed Decreased libido associated with depression which is currently not treated.     Plan:     Routine gyn f/u Appointment at [facility] is scheduled I refilled her Zoloft 100 mg  ,  G, MD 10/14/2016      

## 2016-10-14 NOTE — Patient Instructions (Signed)

## 2016-11-11 ENCOUNTER — Encounter: Payer: Self-pay | Admitting: Family Medicine

## 2016-11-11 ENCOUNTER — Ambulatory Visit (INDEPENDENT_AMBULATORY_CARE_PROVIDER_SITE_OTHER): Payer: Medicaid Other | Admitting: Family Medicine

## 2016-11-11 VITALS — BP 118/78 | HR 89 | Temp 98.2°F | Ht 68.0 in | Wt 291.4 lb

## 2016-11-11 DIAGNOSIS — M222X2 Patellofemoral disorders, left knee: Secondary | ICD-10-CM

## 2016-11-11 DIAGNOSIS — Z23 Encounter for immunization: Secondary | ICD-10-CM

## 2016-11-11 DIAGNOSIS — M222X1 Patellofemoral disorders, right knee: Secondary | ICD-10-CM

## 2016-11-11 DIAGNOSIS — F418 Other specified anxiety disorders: Secondary | ICD-10-CM

## 2016-11-11 MED ORDER — SERTRALINE HCL 50 MG PO TABS: ORAL_TABLET | ORAL | 0 refills | Status: DC

## 2016-11-11 MED ORDER — NAPROXEN 500 MG PO TABS: 500.0000 mg | ORAL_TABLET | Freq: Two times a day (BID) | ORAL | 2 refills | Status: DC

## 2016-11-11 MED ORDER — SERTRALINE HCL 100 MG PO TABS: ORAL_TABLET | ORAL | 0 refills | Status: DC

## 2016-11-11 NOTE — Progress Notes (Signed)
Chief Complaint  Patient presents with  . Depression    Subjective Molly Savage presents for f/u anxiety/depression.  She is currently being treated with Zoloft 150 mg daily and some other medicine that psych started.  Reports improvement since treatemnt. Has failed Prozac. Reports getting routine exercise. No current thoughts of harming self or others. No self-medication with alcohol, prescription drugs or illicit drugs. She is following with a counselor/psychologist.  B/l knee pain continues. PT ordered, she did not feel like doing it at that time, but is interested now. Still having anterior knee pain, worse when squatting or sitting for long periods of time. She will use ibuprofen and Tylenol intermittently. It is starting to get worse. No numbness, tingling, or weakness.   ROS Psych: No homicidal or suicidal thoughts MSK: +b/l knee pain  Past Medical History:  Diagnosis Date  . Diabetes mellitus type 1 ([place])    Follows with Endo  . Tobacco abuse    Family History  Problem Relation Age of Onset  . Hypertension Mother   . Bipolar disorder Sister   . Heart disease Maternal Uncle   . ADD / ADHD Brother    Allergies as of 11/11/2016   No Known Allergies     Medication List       Accurate as of 11/11/16 11:01 AM. Always use your most recent med list.          acetaminophen 500 MG tablet Commonly known as:  TYLENOL Take 2,000 mg by mouth every 6 (six) hours as needed for moderate pain.   insulin aspart 100 UNIT/ML injection Commonly known as:  novoLOG Use as directed in insulin pump. MDD = 125 units. DX. E10.65   NOVOLOG 100 UNIT/ML injection Generic drug:  insulin aspart USE AS DIRECTED IN INSULIN PUMP - MAXIMUM DAILY DOSE OF 125 UNITS   MINIMED 630G INSULIN PUMP Kit by Does not apply route.   naproxen 500 MG tablet Commonly known as:  NAPROSYN Take 1 tablet (500 mg total) by mouth 2 (two) times daily with a meal.   omeprazole 20 MG  capsule Commonly known as:  PRILOSEC Take 1 capsule (20 mg total) by mouth 2 (two) times daily before a meal.   sertraline 100 MG tablet Commonly known as:  ZOLOFT Take 1 tab daily with the 50 mg tab for a total 150 mg daily.   sertraline 50 MG tablet Commonly known as:  ZOLOFT Take with 100 mg tab for 150 mg daily total.       Exam BP 118/78 (BP Location: Left Arm, Patient Position: Sitting, Cuff Size: Large)   Pulse 89   Temp 98.2 F (36.8 C) (Oral)   Ht 5' 8"  (1.727 m)   Wt 291 lb 6 oz (132.2 kg)   SpO2 98%   BMI 44.30 kg/m  General:  well developed, well nourished, in no apparent distress Lungs:  no respiratory distress MSK: No TTP over knees, no effusion, neg patellar apprehension/grind, Lachman's, McMurray's, varus/valgus stress b/l Psych: well oriented with normal range of affect and age-appropriate judgement/insight, alert and oriented x4.  Assessment and Plan  Anxiety with depression - Plan: sertraline (ZOLOFT) 100 MG tablet, sertraline (ZOLOFT) 50 MG tablet  Patellofemoral arthralgia of both knees - Plan: naproxen (NAPROSYN) 500 MG tablet, Ambulatory referral to Physical Therapy  Need for influenza vaccination - Plan: Flu Vaccine QUAD 6+ mos PF IM (Fluarix Quad PF)  Orders as above. Ok to call in Zoloft as pt is having communication issues with  psych. She will call us with name of other medication that was started if psych will not call it in.  Counseled on diet and exercise affecting mood. Refer to PT again. Home stretches/exercises given again. NSAIDs. Ice.  F/u in 2 mo to recheck knee pain, I do not need to see her for anx/dep anymore if she is seeing psych. The patient voiced understanding and agreement to the plan.  [place], DO 11/11/16 11:01 AM

## 2016-11-11 NOTE — Patient Instructions (Signed)
Knee Exercises Ask your health care provider which exercises are safe for you. Do exercises exactly as told by your health care provider and adjust them as directed. It is normal to feel mild stretching, pulling, tightness, or discomfort as you do these exercises, but you should stop right away if you feel sudden pain or your pain gets worse.Do not begin these exercises until told by your health care provider. STRETCHING AND RANGE OF MOTION EXERCISES  These exercises warm up your muscles and joints and improve the movement and flexibility of your knee. These exercises also help to relieve pain, numbness, and tingling. Exercise A: Knee Extension, Prone  1. Lie on your abdomen on a bed. 2. Place your left / right knee just beyond the edge of the surface so your knee is not on the bed. You can put a towel under your left / right thigh just above your knee for comfort. 3. Relax your leg muscles and allow gravity to straighten your knee. You should feel a stretch behind your left / right knee. 4. Hold this position for 15-20 seconds. 5. Scoot up so your knee is supported between repetitions. Repeat 2-3 times. Complete this stretch 1 time a day. Exercise B: Knee Flexion, Active    1. Lie on your back with both knees straight. If this causes back discomfort, bend your left / right knee so your foot is flat on the floor. 2. Slowly slide your left / right heel back toward your buttocks until you feel a gentle stretch in the front of your knee or thigh. 3. Hold this position for 15-20 seconds. 4. Slowly slide your left / right heel back to the starting position. Repeat 2-3 times. Complete this exercise 1 time a day. Exercise C: Quadriceps, Prone    1. Lie on your abdomen on a firm surface, such as a bed or padded floor. 2. Bend your left / right knee and hold your ankle. If you cannot reach your ankle or pant leg, loop a belt around your foot and grab the belt instead. 3. Gently pull your heel  toward your buttocks. Your knee should not slide out to the side. You should feel a stretch in the front of your thigh and knee. 4. Hold this position for 15-20 seconds. Repeat 2-3 times. Complete this stretch 1 time a day. Exercise D: Hamstring, Supine  1. Lie on your back. 2. Loop a belt or towel over the ball of your left / right foot. The ball of your foot is on the walking surface, right under your toes. 3. Straighten your left / right knee and slowly pull on the belt to raise your leg until you feel a gentle stretch behind your knee. ? Do not let your left / right knee bend while you do this. ? Keep your other leg flat on the floor. 4. Hold this position for 15-20 seconds. Repeat 2-3 times. Complete this stretch 1 time a day. STRENGTHENING EXERCISES  These exercises build strength and endurance in your knee. Endurance is the ability to use your muscles for a long time, even after they get tired. Exercise E: Quadriceps, Isometric    1. Lie on your back with your left / right leg extended and your other knee bent. Put a rolled towel or small pillow under your knee if told by your health care provider. 2. Slowly tense the muscles in the front of your left / right thigh. You should see your kneecap slide up toward your  hip or see increased dimpling just above the knee. This motion will push the back of the knee toward the floor. 3. For 15 seconds, keep the muscle as tight as you can without increasing your pain. 4. Relax the muscles slowly and completely. Repeat 1 times. Complete this exercise every other day. Exercise F: Straight Leg Raises - Quadriceps  1. Lie on your back with your left / right leg extended and your other knee bent. 2. Tense the muscles in the front of your left / right thigh. You should see your kneecap slide up or see increased dimpling just above the knee. Your thigh may even shake a bit. 3. Keep these muscles tight as you raise your leg 4-6 inches (10-15 cm) off the  floor. Do not let your knee bend. 4. Hold this position for 15 seconds. 5. Keep these muscles tense as you lower your leg. 6. Relax your muscles slowly and completely after each repetition. Repeat 2 times. Complete this exercise every other day.  Exercise G: Hamstring Curls    If told by your health care provider, do this exercise while wearing ankle weights. Begin with 5 lb weights (optional). Then increase the weight by 1 lb (0.5 kg) increments. Do not wear ankle weights that are more than 20 lbs to start with. 1. Lie on your abdomen with your legs straight. 2. Bend your left / right knee as far as you can without feeling pain. Keep your hips flat against the floor. 3. Hold this position for 10 seconds. 4. Slowly lower your leg to the starting position. Repeat 2 times. Complete this exercise every other day. Exercise H: Squats (Quadriceps)  1. Stand in front of a table, with your feet and knees pointing straight ahead. You may rest your hands on the table for balance but not for support. 2. Slowly bend your knees and lower your hips like you are going to sit in a chair. ? Keep your weight over your heels, not over your toes. ? Keep your lower legs upright so they are parallel with the table legs. ? Do not let your hips go lower than your knees. ? Do not bend lower than told by your health care provider. ? If your knee pain increases, do not bend as low. 3. Hold the squat position for 10 seconds. 4. Slowly push with your legs to return to standing. Do not use your hands to pull yourself to standing. Repeat 2 times. Complete this exercise every otherday. Exercise I: Wall Slides (Quadriceps)    1. Lean your back against a smooth wall or door while you walk your feet out 18-24 inches (46-61 cm) from it. 2. Place your feet hip-width apart. 3. Slowly slide down the wall or door until your knees Repeat 2 times. Complete this exercise every other day. 4. Exercise K: Straight Leg Raises -  Hip Abductors  1. Lie on your side with your left / right leg in the top position. Lie so your head, shoulder, knee, and hip line up. You may bend your bottom knee to help you keep your balance. 2. Roll your hips slightly forward so your hips are stacked directly over each other and your left / right knee is facing forward. 3. Leading with your heel, lift your top leg 4-6 inches (10-15 cm). You should feel the muscles in your outer hip lifting. ? Do not let your foot drift forward. ? Do not let your knee roll toward the ceiling. 4. Hold this  position for 10-15 seconds. 5. Slowly return your leg to the starting position. 6. Let your muscles relax completely after each repetition. Repeat 1-2 times. Complete this exercise every other. Exercise J: Straight Leg Raises - Hip Extensors  1. Lie on your abdomen on a firm surface. You can put a pillow under your hips if that is more comfortable. 2. Tense the muscles in your buttocks and lift your left / right leg about 4-6 inches (10-15 cm). Keep your knee straight as you lift your leg. 3. Hold this position for 10-15 seconds. 4. Slowly lower your leg to the starting position. 5. Let your leg relax completely after each repetition. Repeat 2 times. Complete this exercise every other day. This information is not intended to replace advice given to you by your health care provider. Make sure you discuss any questions you have with your health care provider. Document Released: 11/21/2004 Document Revised: 10/02/2015 Document Reviewed: 11/13/2014 Elsevier Interactive Patient Education  2017 [employer]

## 2016-11-11 NOTE — Progress Notes (Signed)
Pre visit review using our clinic review tool, if applicable. No additional management support is needed unless otherwise documented below in the visit note. 

## 2016-11-19 ENCOUNTER — Ambulatory Visit: Payer: Medicaid Other | Attending: Family Medicine

## 2016-12-27 ENCOUNTER — Telehealth: Payer: Self-pay | Admitting: Family Medicine

## 2016-12-27 NOTE — Telephone Encounter (Signed)
Copied from CRM [id number]. Topic: Quick Communication - Rx Refill/Question >> Dec 27, 2016  3:12 PM Crist InfanteHarrald, Kathy J wrote: Has the patient contacted their pharmacy? No (not filled since 01/2016 Pt request refill  ibuprofen (ADVIL,MOTRIN) tablet 600 mg  [facility] [address] - [place] ([address]), [place] - [address] [phone] (Phone) [phone] (Fax)

## 2016-12-31 ENCOUNTER — Ambulatory Visit: Payer: Self-pay | Admitting: *Deleted

## 2016-12-31 NOTE — Telephone Encounter (Signed)
Attempted to contact pt. To discuss her request for Ibuprofen 600 mg.  Left message to return call to the office.
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
## 2017-01-01 NOTE — Telephone Encounter (Signed)
Ok to refill naproxen. 

## 2017-01-10 ENCOUNTER — Ambulatory Visit: Payer: Medicaid Other | Admitting: Family Medicine

## 2017-01-30 ENCOUNTER — Telehealth: Payer: Self-pay | Admitting: Family Medicine

## 2017-01-30 ENCOUNTER — Encounter: Payer: Self-pay | Admitting: Family Medicine

## 2017-01-30 ENCOUNTER — Ambulatory Visit: Payer: Self-pay | Admitting: Family Medicine

## 2017-01-30 ENCOUNTER — Ambulatory Visit (INDEPENDENT_AMBULATORY_CARE_PROVIDER_SITE_OTHER): Payer: Medicaid Other | Admitting: Family Medicine

## 2017-01-30 VITALS — BP 120/86 | HR 121 | Temp 98.0°F | Ht 68.5 in | Wt 289.4 lb

## 2017-01-30 DIAGNOSIS — J069 Acute upper respiratory infection, unspecified: Secondary | ICD-10-CM | POA: Diagnosis not present

## 2017-01-30 DIAGNOSIS — B9789 Other viral agents as the cause of diseases classified elsewhere: Secondary | ICD-10-CM

## 2017-01-30 DIAGNOSIS — F418 Other specified anxiety disorders: Secondary | ICD-10-CM | POA: Diagnosis not present

## 2017-01-30 MED ORDER — METHYLPREDNISOLONE ACETATE 80 MG/ML IJ SUSP
80.0000 mg | Freq: Once | INTRAMUSCULAR | Status: AC
  Administered 2017-01-30: 80 mg via INTRAMUSCULAR

## 2017-01-30 MED ORDER — SERTRALINE HCL 100 MG PO TABS: 200.0000 mg | ORAL_TABLET | Freq: Every day | ORAL | 1 refills | Status: DC

## 2017-01-30 MED ORDER — BENZONATATE 100 MG PO CAPS: 100.0000 mg | ORAL_CAPSULE | Freq: Three times a day (TID) | ORAL | 0 refills | Status: DC | PRN

## 2017-01-30 MED ORDER — DEXTROMETHORPHAN-GUAIFENESIN 5-100 MG/5ML PO LIQD: 5.0000 mL | Freq: Three times a day (TID) | ORAL | 0 refills | Status: DC | PRN

## 2017-01-30 NOTE — Telephone Encounter (Signed)
Copied from CRM [id number]. Topic: Quick Communication - See Telephone Encounter >> Jan 30, 2017  2:52 PM Oneal GroutSebastian, Jennifer S wrote: CRM for notification. See Telephone encounter for:  benzonatate (TESSALON) 100 MG capsule is not covered by medicaid. Please advise 01/30/17.

## 2017-01-30 NOTE — Addendum Note (Signed)
Addended by: Orlene OchRENCE, Savayah Waltrip N on: 01/30/2017 01:53 PM   Modules accepted: Orders

## 2017-01-30 NOTE — Progress Notes (Signed)
Chief Complaint  Patient presents with  . Cough  . Headache  . Sore Throat    Molly BloomSusan Savage here for URI complaints.  Duration: 4 days  Associated symptoms: sinus congestion, rhinorrhea, sore throat, wheezing and cough Denies: sinus congestion, sinus pain, rhinorrhea, itchy watery eyes, ear pain, ear drainage, myalgia and fevers/rigors Treatment to date: Dayquil, Nyquil Sick contacts: No  Pt doesn't think that Zoloft is working as well, would like to increase dose. Has been using 150 mg/d and is compliant. No AE's. She does follow with psych and has an appt next mo.   ROS:  Const: Denies fevers HEENT: As noted in HPI Lungs: +cough  Past Medical History:  Diagnosis Date  . Diabetes mellitus type 1 ([facility])    Follows with Endo  . Tobacco abuse    Family History  Problem Relation Age of Onset  . Hypertension Mother   . Bipolar disorder Sister   . Heart disease Maternal Uncle   . ADD / ADHD Brother    No Known Allergies  Past Surgical History:  Procedure Laterality Date  . CESAREAN SECTION N/A 02/01/2016   Procedure: CESAREAN SECTION;  Surgeon: Catalina AntiguaPeggy Constant, MD;  Location: [facility];  Service: Obstetrics;  Laterality: N/A;   BP 120/86 (BP Location: Left Arm, Patient Position: Sitting, Cuff Size: Large)   Pulse (!) 121   Temp 98 F (36.7 C) (Oral)   Ht 5' 8.5" (1.74 m)   Wt 289 lb 6 oz (131.3 kg)   SpO2 96%   BMI 43.36 kg/m  General: Awake, alert, appears stated age HEENT: AT, [place], ears patent b/l and TM's neg, nares patent w/o discharge, pharynx pink and without exudates, MMM Neck: No masses or asymmetry Heart: reg rhythm, tachycardic Lungs: CTAB, no accessory muscle use Psych: Age appropriate judgment and insight, normal mood and affect  Viral URI with cough - Plan: benzonatate (TESSALON) 100 MG capsule  Anxiety with depression - Plan: sertraline (ZOLOFT) 100 MG tablet, DISCONTINUED: sertraline (ZOLOFT) 100 MG tablet  Morbid obesity  ([facility])  Orders as above. Continue to push fluids, practice good hand hygiene, cover mouth when coughing. Increase dose of Zoloft to 200 mg/d. Following up w psych.  F/u prn. If starting to experience fevers, shaking, or shortness of breath, seek immediate care. Pt voiced understanding and agreement to the plan.  Jilda Rocheicholas Paul [place], DO 01/30/17 1:51 PM

## 2017-01-30 NOTE — Patient Instructions (Addendum)
Continue to push fluids, practice good hand hygiene, and cover your mouth if you cough.  If you start having fevers, shaking or shortness of breath, seek immediate care.   Take 2 of the 100 mg tabs of Zoloft for 200 mg daily. A new rx as been called in.   Let us know if you need anything.

## 2017-01-30 NOTE — Telephone Encounter (Signed)
Syrup called in, if this doesn't work can we find out what antitussive MCD will cover? TY.

## 2017-01-30 NOTE — Progress Notes (Signed)
Pre visit review using our clinic review tool, if applicable. No additional management support is needed unless otherwise documented below in the visit note. 

## 2017-01-31 NOTE — Telephone Encounter (Signed)
Called left detailed message other medication sent in and PCP instructions.

## 2017-03-18 ENCOUNTER — Other Ambulatory Visit: Payer: Self-pay | Admitting: Family Medicine

## 2017-03-31 ENCOUNTER — Ambulatory Visit: Payer: Self-pay | Admitting: Family Medicine

## 2017-04-23 ENCOUNTER — Ambulatory Visit: Payer: Self-pay | Admitting: Family Medicine

## 2017-04-24 ENCOUNTER — Encounter: Payer: Self-pay | Admitting: Family Medicine

## 2017-04-24 ENCOUNTER — Ambulatory Visit (INDEPENDENT_AMBULATORY_CARE_PROVIDER_SITE_OTHER): Payer: Medicaid Other | Admitting: Family Medicine

## 2017-04-24 VITALS — BP 117/76 | HR 102 | Temp 98.5°F | Ht 68.0 in | Wt 300.1 lb

## 2017-04-24 DIAGNOSIS — F5089 Other specified eating disorder: Secondary | ICD-10-CM

## 2017-04-24 DIAGNOSIS — M25562 Pain in left knee: Secondary | ICD-10-CM

## 2017-04-24 DIAGNOSIS — L84 Corns and callosities: Secondary | ICD-10-CM | POA: Diagnosis not present

## 2017-04-24 DIAGNOSIS — M25561 Pain in right knee: Secondary | ICD-10-CM | POA: Diagnosis not present

## 2017-04-24 DIAGNOSIS — G8929 Other chronic pain: Secondary | ICD-10-CM | POA: Diagnosis not present
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
## 2017-04-24 NOTE — Patient Instructions (Addendum)
Soak the feet nightly before using a pummus stone or foot file to whittle away at the callus on your feet. Look out for cracks in your feet/callus.   Let's get set up with PT. Let us know if you need a referral. I think this is the next step.    Cont NSAIDs and Tylenol.   Let us know if you need anything.

## 2017-04-24 NOTE — Progress Notes (Signed)
Pre visit review using our clinic review tool, if applicable. No additional management support is needed unless otherwise documented below in the visit note. 

## 2017-04-25 ENCOUNTER — Other Ambulatory Visit: Payer: Self-pay | Admitting: Family Medicine

## 2017-04-25 DIAGNOSIS — D508 Other iron deficiency anemias: Secondary | ICD-10-CM

## 2017-04-25 LAB — CBC
HCT: 31.3 % — ABNORMAL LOW (ref 36.0–46.0)
Hemoglobin: 10.1 g/dL — ABNORMAL LOW (ref 12.0–15.0)
MCHC: 32.2 g/dL (ref 30.0–36.0)
MCV: 72.7 fl — ABNORMAL LOW (ref 78.0–100.0)
Platelets: 184 10*3/uL (ref 150.0–400.0)
RBC: 4.31 Mil/uL (ref 3.87–5.11)
RDW: 19.9 % — ABNORMAL HIGH (ref 11.5–15.5)
WBC: 10.1 10*3/uL (ref 4.0–10.5)

## 2017-04-25 LAB — IBC PANEL
Iron: 15 ug/dL — ABNORMAL LOW (ref 42–145)
Saturation Ratios: 3.4 % — ABNORMAL LOW (ref 20.0–50.0)
Transferrin: 311 mg/dL (ref 212.0–360.0)

## 2017-04-25 LAB — FERRITIN: Ferritin: 5.4 ng/mL — ABNORMAL LOW (ref 10.0–291.0)

## 2017-04-26 ENCOUNTER — Encounter: Payer: Self-pay | Admitting: Family Medicine

## 2017-04-28 ENCOUNTER — Telehealth: Payer: Self-pay | Admitting: Family Medicine

## 2017-04-28 NOTE — Telephone Encounter (Signed)
Tell her to look up Kegel exercises because that is what she needs, not a medicine. TY.

## 2017-04-28 NOTE — Telephone Encounter (Signed)
Called left message to call back 

## 2017-04-28 NOTE — Telephone Encounter (Signed)
Copied from CRM [id number]. Topic: Quick Communication - See Telephone Encounter >> Apr 28, 2017  2:58 PM Windy KalataMichael, Zavannah Deblois L, NT wrote: CRM for notification. See Telephone encounter for: 04/28/17.  Patient was seen on 04/24/17. She states she needs something for incontinence. She states every time she coughs she pees. She states it has been going on for awhile since she had her son in jan 2018. She states she hopes since she was just seen on 04/24/17 something can be called in without her having to make another appt. If so please contact patient.   [facility] [address] - [place], [place] - [id number] [place] [id number] Phone: [phone] Fax: [phone]

## 2017-04-30 NOTE — Telephone Encounter (Signed)
Patient informed of PCP instructions.  She agreed to try. 

## 2017-05-15 ENCOUNTER — Encounter: Payer: Self-pay | Admitting: *Deleted

## 2017-05-21 ENCOUNTER — Ambulatory Visit: Payer: Medicaid Other | Admitting: Family Medicine

## 2017-05-21 ENCOUNTER — Encounter: Payer: Self-pay | Admitting: Family Medicine

## 2017-05-21 VITALS — BP 118/76 | HR 95 | Temp 98.0°F | Ht 68.5 in | Wt 305.4 lb

## 2017-05-21 DIAGNOSIS — M222X1 Patellofemoral disorders, right knee: Secondary | ICD-10-CM

## 2017-05-21 DIAGNOSIS — M79672 Pain in left foot: Secondary | ICD-10-CM | POA: Diagnosis not present

## 2017-05-21 DIAGNOSIS — L309 Dermatitis, unspecified: Secondary | ICD-10-CM

## 2017-05-21 DIAGNOSIS — M222X2 Patellofemoral disorders, left knee: Secondary | ICD-10-CM

## 2017-05-21 MED ORDER — NAPROXEN 500 MG PO TABS: 500.0000 mg | ORAL_TABLET | Freq: Two times a day (BID) | ORAL | 2 refills | Status: DC

## 2017-05-21 MED ORDER — TRIAMCINOLONE ACETONIDE 0.1 % EX CREA: 1.0000 "application " | TOPICAL_CREAM | Freq: Two times a day (BID) | CUTANEOUS | 0 refills | Status: DC

## 2017-05-21 NOTE — Patient Instructions (Signed)
If you do not hear anything about your referrals in the next 1-2 weeks, call our office and ask for an update.  Do not use scented products. Keep the area moisturized.  Ferrous sulfate (oral iron) is available over the counter. I strongly recommend you take this supplement 3 times daily.  Let us know if you need anything.
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
## 2017-05-21 NOTE — Progress Notes (Signed)
Pre visit review using our clinic review tool, if applicable. No additional management support is needed unless otherwise documented below in the visit note. 

## 2017-05-28 ENCOUNTER — Other Ambulatory Visit: Payer: Self-pay

## 2017-05-28 ENCOUNTER — Ambulatory Visit: Payer: Medicaid Other | Attending: Family Medicine | Admitting: Physical Therapy

## 2017-05-28 ENCOUNTER — Encounter: Payer: Self-pay | Admitting: Physical Therapy

## 2017-05-28 DIAGNOSIS — R29898 Other symptoms and signs involving the musculoskeletal system: Secondary | ICD-10-CM | POA: Insufficient documentation

## 2017-05-28 DIAGNOSIS — M25561 Pain in right knee: Secondary | ICD-10-CM | POA: Diagnosis present

## 2017-05-28 DIAGNOSIS — G8929 Other chronic pain: Secondary | ICD-10-CM | POA: Diagnosis present

## 2017-05-28 DIAGNOSIS — R262 Difficulty in walking, not elsewhere classified: Secondary | ICD-10-CM

## 2017-05-28 DIAGNOSIS — M25661 Stiffness of right knee, not elsewhere classified: Secondary | ICD-10-CM | POA: Diagnosis present

## 2017-05-28 DIAGNOSIS — M25662 Stiffness of left knee, not elsewhere classified: Secondary | ICD-10-CM | POA: Insufficient documentation

## 2017-05-28 DIAGNOSIS — M25562 Pain in left knee: Secondary | ICD-10-CM | POA: Diagnosis not present

## 2017-05-28 NOTE — Therapy (Signed)
[facility] [address]  [address] [place], [place], [address] Phone: [phone]   Fax:  [phone]  Physical Therapy Evaluation  Patient Details  Name: Molly Savage MRN: [id number] Date of Birth: Oct 10, 1990 Referring Provider: Arva Chafe, DO   Encounter Date: 05/28/2017  PT End of Session - 05/28/17 1421    Visit Number  1    Number of Visits  7    Date for PT Re-Evaluation  07/11/17    Authorization Type  Medicaid (submitted for approval)    PT Start Time  1317    PT Stop Time  1402    PT Time Calculation (min)  45 min    Activity Tolerance  Patient tolerated treatment well    Behavior During Therapy  [facility] for tasks assessed/performed       Past Medical History:  Diagnosis Date  . Diabetes mellitus type 1 (HCC)    Follows with Endo  . Tobacco abuse     Past Surgical History:  Procedure Laterality Date  . CESAREAN SECTION N/A 02/01/2016   Procedure: CESAREAN SECTION;  Surgeon: Catalina Antigua, MD;  [facility]: [facility];  Service: Obstetrics;  Laterality: N/A;    There were no vitals filed for this visit.   Subjective Assessment - 05/28/17 1321    Subjective  Patient reports she has been having B knee pain for the past 6 months and reports she has been putting off going to PT. Pain in L>R. Had been taking Naproxen for the pain which recently stopping helping. Aggravating factors include, driving, stairs, walking, crossing legs. Reports onset of pain started about 6 months ago, then noticed pain in hips and ankles as well. Reports she also has noticed swelling in B LEs, but has not mentioned this to her MD. Reports she knows she needs to lose weight but does not lead an active lifestlye.    Pertinent History  DM I    Limitations  Sitting;Standing;Walking    How long can you sit comfortably?  5-10 minutes    How long can you stand comfortably?  5-10 minutes    How long can you walk comfortably?   2-3 min    Diagnostic tests  None    Patient Stated Goals  walk and drive without pain    Currently in Pain?  Yes    Pain Score  2     Pain Location  Knee    Pain Orientation  Left    Pain Descriptors / Indicators  Dull    Pain Type  Chronic pain    Multiple Pain Sites  Yes         [facility] PT Assessment - 05/28/17 0001      Assessment   Medical Diagnosis  Patellofemoral arthralgia of both knees    Referring Provider  Arva Chafe, DO    Onset Date/Surgical Date  -- 6 months    Next MD Visit  Mid May    Prior Therapy  No      Precautions   Precautions  None      Restrictions   Weight Bearing Restrictions  No      Balance Screen   Has the patient fallen in the past 6 months  Yes stepping in hole at work    How many times?  2    Has the patient had a decrease in activity level because of a fear of falling?   No    Is the  patient reluctant to leave their home because of a fear of falling?   No      Home Public house manager residence    Living Arrangements  Spouse/significant other;Children    Available Help at Discharge  Family    Type of Home  -- mobile home    Home Access  Stairs to enter    Entrance Stairs-Number of Steps  3    Entrance Stairs-Rails  None      Prior Function   Level of Independence  Independent    Vocation  Part time employment    Vocation Requirements  delivery driver    Leisure  playing with son      Cognition   Overall Cognitive Status  Within Functional Limits for tasks assessed      Observation/Other Assessments   Observations  B LEs slightly edematous      Sensation   Light Touch  Appears Intact tingling in feet      Coordination   Gross Motor Movements are Fluid and Coordinated  Yes      Posture/Postural Control   Posture/Postural Control  Postural limitations    Postural Limitations  Rounded Shoulders;Forward head;Flexed trunk    Posture Comments  Slight L weight shift      ROM / Strength   AROM /  PROM / Strength  AROM;PROM;Strength      AROM   AROM Assessment Site  Hip;Knee    Right/Left Hip  Right;Left    Right Hip External Rotation   30    Right Hip Internal Rotation   -- limited; not measured    Left Hip Flexion  -- limited; not measured    Left Hip External Rotation   30    Right/Left Knee  Right;Left    Right Knee Extension  0    Right Knee Flexion  135    Left Knee Extension  2    Left Knee Flexion  132      Strength   Strength Assessment Site  Hip;Knee;Ankle    Right/Left Hip  Right;Left    Right Hip Flexion  4/5    Right Hip ABduction  4/5    Right Hip ADduction  4/5    Left Hip Flexion  3+/5    Left Hip ABduction  4/5    Left Hip ADduction  4/5    Right/Left Knee  Right;Left    Right Knee Flexion  3+/5    Right Knee Extension  3+/5    Left Knee Flexion  3+/5    Left Knee Extension  3+/5    Right/Left Ankle  Right;Left    Right Ankle Dorsiflexion  4-/5    Left Ankle Dorsiflexion  4-/5      Flexibility   Soft Tissue Assessment /Muscle Length  yes    Hamstrings  Limited B LEs      Palpation   Patella mobility  L slight hypomobility      Ambulation/Gait   Ambulation/Gait  --    Gait Pattern  Lateral trunk lean to right;Lateral trunk lean to left prominent mediolateral weight shift throughout                Objective measurements completed on examination: See above findings.              PT Education - 05/28/17 1416    Education provided  Yes    Education Details  prognosis, POC, HEP    Person(s) Educated  Patient    Methods  Explanation;Demonstration;Tactile cues;Handout;Verbal cues    Comprehension  Verbalized understanding;Returned demonstration          PT Long Term Goals - 05/28/17 1437      PT LONG TERM GOAL #1   Title  Patient to be independent with advanced HEP.    Time  6    Period  Weeks    Status  New    Target Date  07/11/17      PT LONG TERM GOAL #2   Title  Patient to demonstrate WNL hamstring  flexibility in B LEs.    Time  6    Period  Weeks    Status  New    Target Date  07/11/17      PT LONG TERM GOAL #4   Title  Patient to demonstrate >=4+/5 strength in B LE knees & hips.    Time  6    Period  Weeks    Status  New    Target Date  07/11/17      PT LONG TERM GOAL #5   Title  Patient to tolerate 45-60 minutes of sitting without pain while working.     Time  6    Period  Weeks    Status  New    Target Date  07/11/17             Plan - 05/28/17 1427    Clinical Impression Statement  Patient is a 26y/o F presenting to OPPT with c/o B knee pain of 6 months duration, L>R. Reports that since that time, she has also been experiencing B hip and ankle pain as well as B calf swelling. Patient with pain with patellar mobility and SLR. Presents with the following impairments: decreased ROM, decreased strength, decreased flexibility, decreased joint mobility, gait impairments. Would benefit from skilled physical therapy services in order to address the aforementioned impairments. Advised patient to speak with MD about calf swelling concerns. Also educated on avoiding aggravating positions such as crossing legs when possible. Patient educated on and received HEP; able to demonstrate and reported understanding.      History and Personal Factors relevant to plan of care:  DM I    Clinical Presentation  Evolving    Clinical Presentation due to:  multiple joints involved    Clinical Decision Making  Low    Rehab Potential  Good    PT Frequency  1x / week    PT Duration  6 weeks    PT Treatment/Interventions  Ultrasound;Cryotherapy;Electrical Stimulation;Iontophoresis /ml Dexamethasone;Moist Heat;Gait training;Stair training;Functional mobility training;Therapeutic activities;Therapeutic exercise;Balance training;Neuromuscular re-education;Patient/family education;Manual techniques;Vasopneumatic Device;Taping;Dry needling;Passive range of motion    PT Next Visit Plan  Reassess HEP,  progress LE stretching/strenghening as tolerated     Consulted and Agree with Plan of Care  Patient       Patient will benefit from skilled therapeutic intervention in order to improve the following deficits and impairments:  Abnormal gait, Decreased endurance, Hypomobility, Increased edema, Decreased activity tolerance, Decreased strength, Pain, Difficulty walking, Decreased range of motion, Impaired flexibility, Postural dysfunction  Visit Diagnosis: Chronic pain of left knee  Chronic pain of right knee  Stiffness of left knee, not elsewhere classified  Stiffness of right knee, not elsewhere classified  Difficulty in walking, not elsewhere classified  Other symptoms and signs involving the musculoskeletal system     Problem List Patient Active Problem List   Diagnosis Date Noted  . Patellofemoral arthralgia of both knees 05/21/2017  .  Left foot pain 05/21/2017  . Callus of foot 04/24/2017  . Morbid obesity (HCC) 01/30/2017  . IUD contraception 09/11/2016  . Lactose intolerance 12/26/2015  . BMI 40.0-44.9, adult (HCC) 09/18/2015  . Anxiety with depression 07/30/2015  . Depression 07/30/2015  . Diabetic foot infection (HCC) 04/17/2015  . 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 Tobacco abuse   . Chronic hypertension 08/24/2014    Anette Guarneri, PT, DPT 05/28/17 2:45 PM  [facility] [address]  [address] [place], [place], [address] Phone: [phone]   Fax:  [phone]  Name: Molly Savage MRN: [id number] Date of Birth: 07/29/1990

## 2017-06-02 ENCOUNTER — Ambulatory Visit: Payer: Self-pay

## 2017-06-02 NOTE — Telephone Encounter (Signed)
Called informed of PCP instructions/she agreed to do/verbalized understanding.

## 2017-06-02 NOTE — Telephone Encounter (Signed)
Consider pads. F/u with GYN or [place] recommended if this does not resolve.

## 2017-06-02 NOTE — Telephone Encounter (Addendum)
Pt states that she is having nausea and vomiting shortly after using a tampon. She inserted a tampon yesterday and shortly after became nauseated and vomited and removed the tampon then felt better. Today she inserted and had to remove it shortly after due to nausea. Pt stated her flow is heavy and was trying tampons. She stated that she does not use tampons usually. Pt stated that her face felt hot and thought she was having a fever. Called FC to discuss pt's sx. FC asked to send triage note high priority to discuss with PCP. Advised pt to call if N/V worsens or if develops fever, rash. Care advice given per protocol.  Reason for Disposition . Unexplained nausea  Answer Assessment - Initial Assessment Questions 1. NAUSEA SEVERITY: "How bad is the nausea?" (e.g., mild, moderate, severe; dehydration, weight loss)   - MILD: loss of appetite without change in eating habits   - MODERATE: decreased oral intake without significant weight loss, dehydration, or malnutrition   - SEVERE: inadequate caloric or fluid intake, significant weight loss, symptoms of dehydration     Mild  2. ONSET: "When did the nausea begin?"     Yesterday am 3. VOMITING: "Any vomiting?" If so, ask: "How many times today?"     Yes yesterday vomited for several minutes 4. RECURRENT SYMPTOM: "Have you had nausea before?" If so, ask: "When was the last time?" "What happened that time?"     No felt fine until used tampons 5. CAUSE: "What do you think is causing the nausea?"     Tampons super plus size 6. PREGNANCY: "Is there any chance you are pregnant?" (e.g., unprotected intercourse, missed birth control pill, broken condom)     menses  Protocols used: NAUSEA-A-AH

## 2017-06-02 NOTE — Telephone Encounter (Signed)
Please advise 

## 2017-06-03 ENCOUNTER — Encounter: Payer: Self-pay | Admitting: Podiatry

## 2017-06-03 ENCOUNTER — Ambulatory Visit (INDEPENDENT_AMBULATORY_CARE_PROVIDER_SITE_OTHER): Payer: Medicaid Other

## 2017-06-03 ENCOUNTER — Ambulatory Visit: Payer: Medicaid Other | Admitting: Podiatry

## 2017-06-03 DIAGNOSIS — M216X9 Other acquired deformities of unspecified foot: Secondary | ICD-10-CM

## 2017-06-03 DIAGNOSIS — M779 Enthesopathy, unspecified: Secondary | ICD-10-CM

## 2017-06-03 DIAGNOSIS — Z872 Personal history of diseases of the skin and subcutaneous tissue: Secondary | ICD-10-CM

## 2017-06-03 DIAGNOSIS — L84 Corns and callosities: Secondary | ICD-10-CM

## 2017-06-03 NOTE — Progress Notes (Signed)
Subjective:    Patient ID: Molly Savage, female    DOB: 02-Aug-1990, 27 y.o.   MRN: [id number]  HPI 27 year old female presents the office today for concerns of a very thick, painful lesion to left fifth metatarsal head she presents today to discuss any potential surgery that she can have done to help with the pain.  She states that she has been in the hospital for this before as the area with very thick and get infected.  She would like to do some to help prevent this from happening again.  She states that she has neuropathy.  She is diabetic and she states that her A1c was in the mid eights.   Review of Systems  All other systems reviewed and are negative.  Past Medical History:  Diagnosis Date  . Diabetes mellitus type 1 ([place])    Follows with Endo  . Tobacco abuse     Past Surgical History:  Procedure Laterality Date  . CESAREAN SECTION N/A 02/01/2016   Procedure: CESAREAN SECTION;  Surgeon: Mora Bellman, MD;  Location: [facility];  Service: Obstetrics;  Laterality: N/A;     Current Outpatient Medications:  .  acetaminophen (TYLENOL) 500 MG tablet, Take 2,000 mg by mouth every 6 (six) hours as needed for moderate pain. , Disp: , Rfl:  .  busPIRone (BUSPAR) 10 MG tablet, Take 1 tablet (10 mg total) by mouth 2 (two) times daily., Disp: , Rfl:  .  divalproex (DEPAKOTE) 500 MG DR tablet, Take 1 tablet (500 mg total) by mouth 2 (two) times daily., Disp: , Rfl:  .  insulin aspart (NOVOLOG) 100 UNIT/ML injection, Use as directed in insulin pump. MDD = 125 units. DX. E10.65, Disp: , Rfl:  .  insulin aspart (NOVOLOG) 100 UNIT/ML injection, USE AS DIRECTED IN INSULIN PUMP - MAXIMUM DAILY DOSE OF 125 UNITS, Disp: , Rfl:  .  Insulin Infusion Pump (MINIMED 630G INSULIN PUMP) KIT, by Does not apply route., Disp: , Rfl:  .  lurasidone (LATUDA) 20 MG TABS tablet, Take 1 tablet (20 mg total) by mouth every evening., Disp: 60 tablet, Rfl:  .  naproxen (NAPROSYN) 500 MG tablet, Take 1  tablet (500 mg total) by mouth 2 (two) times daily with a meal., Disp: 30 tablet, Rfl: 2 .  omeprazole (PRILOSEC) 20 MG capsule, TAKE 1 CAPSULE BY MOUTH TWICE DAILY BEFORE A MEAL, Disp: 180 capsule, Rfl: 1 .  sertraline (ZOLOFT) 100 MG tablet, Take 2 tablets (200 mg total) by mouth daily., Disp: 60 tablet, Rfl: 1 .  traZODone (DESYREL) 50 MG tablet, Take 0.5-1 tablets (25-50 mg total) by mouth at bedtime as needed for sleep., Disp: 30 tablet, Rfl: 3 .  triamcinolone cream (KENALOG) 0.1 %, Apply 1 application topically 2 (two) times daily., Disp: 30 g, Rfl: 0  No Known Allergies       Objective:   Physical Exam General: AAO x3, NAD  Dermatological: Very thick hyperkeratotic lesion along the lateral as well as the plantar aspect the left fifth metatarsal head and there is tenderness palpation.  Also a smaller lesion present on the right fifth metatarsal head.  Upon debridement of both these lesions there is no underlying ulceration, drainage or any signs of infection today.  There is no other open lesions or pre-ulcerative lesions.  Vascular: Dorsalis Pedis artery and Posterior Tibial artery pedal pulses are 2/4 bilateral with immedate capillary fill time.  She states that she has a cold sensation to her feet and she  is concerned about her circulation.  There is no pain with calf compression, swelling, warmth, erythema.   Neruologic: Sensation decreased with Derrel Nip monofilament.  Musculoskeletal: Prominent metatarsal heads plantarly with atrophy of the fat pad.  Muscular strength 5/5 in all groups tested bilateral.  Gait: Unassisted, Nonantalgic.     Assessment & Plan:  27 year old female is significant hyperkeratotic lesion left submetatarsal 5 and mild on the right -Treatment options discussed including all alternatives, risks, and complications -Etiology of symptoms were discussed -X-rays were obtained and reviewed with the patient.  There is no evidence of acute fracture or  stress fracture identified today. -I sharply debrided the hyperkeratotic lesion so that any complications of bleeding as a courtesy.  We discussed the issue and options for this.  She has done numerous treatments to try to get the area to stay filed down however she is in the hospital with infections on the left foot previously.  We discussed possible fifth metatarsal head excision to help take pressure off the area.  We discussed the surgery as well as the postoperative course and the risks of surgery.  She states that given the risks she like to take her chance to do the surgery given her ongoing pain.  Prior to this I will get her A1c rechecked and I also would like to get a ABI to ensure adequate healing postoperatively. -I will see her back again in June and will likely plan surgery sometime next month.  Trula Slade DPM

## 2017-06-04 ENCOUNTER — Encounter: Payer: Self-pay | Admitting: Family Medicine

## 2017-06-04 ENCOUNTER — Other Ambulatory Visit (INDEPENDENT_AMBULATORY_CARE_PROVIDER_SITE_OTHER): Payer: Medicaid Other

## 2017-06-04 ENCOUNTER — Encounter: Payer: Medicaid Other | Admitting: Family Medicine

## 2017-06-04 DIAGNOSIS — D508 Other iron deficiency anemias: Secondary | ICD-10-CM

## 2017-06-04 NOTE — Progress Notes (Signed)
Disregard

## 2017-06-04 NOTE — Addendum Note (Signed)
Addended by: Verdie Shire on: 06/04/2017 03:08 PM   Modules accepted: Orders

## 2017-06-04 NOTE — Progress Notes (Signed)
Pre visit review using our clinic review tool, if applicable. No additional management support is needed unless otherwise documented below in the visit note. 

## 2017-06-04 NOTE — Addendum Note (Signed)
Addended by: Verdie Shire on: 06/04/2017 01:46 PM   Modules accepted: Orders

## 2017-06-05 ENCOUNTER — Ambulatory Visit: Payer: Medicaid Other | Admitting: Physical Therapy

## 2017-06-05 ENCOUNTER — Other Ambulatory Visit: Payer: Medicaid Other

## 2017-06-05 ENCOUNTER — Encounter: Payer: Self-pay | Admitting: Family Medicine

## 2017-06-05 LAB — IBC PANEL
Iron: 152 ug/dL — ABNORMAL HIGH (ref 42–145)
Saturation Ratios: 35.6 % (ref 20.0–50.0)
Transferrin: 305 mg/dL (ref 212.0–360.0)

## 2017-06-05 LAB — CBC
HCT: 36 % (ref 36.0–46.0)
Hemoglobin: 11.4 g/dL — ABNORMAL LOW (ref 12.0–15.0)
MCHC: 31.6 g/dL (ref 30.0–36.0)
MCV: 76.2 fl — ABNORMAL LOW (ref 78.0–100.0)
Platelets: 259 10*3/uL (ref 150.0–400.0)
RBC: 4.72 Mil/uL (ref 3.87–5.11)
RDW: 20.2 % — ABNORMAL HIGH (ref 11.5–15.5)
WBC: 7.2 10*3/uL (ref 4.0–10.5)

## 2017-06-05 LAB — FERRITIN: Ferritin: 9 ng/mL — ABNORMAL LOW (ref 10.0–291.0)

## 2017-06-09 MED ORDER — FERROUS SULFATE 324 (65 FE) MG PO TBEC: 1.0000 | DELAYED_RELEASE_TABLET | Freq: Every day | ORAL | Status: DC

## 2017-06-09 NOTE — Telephone Encounter (Signed)
Melissa -- can you advise in PCP's absence?

## 2017-06-09 NOTE — Telephone Encounter (Signed)
Continue iron one tab once daily, repeat serum iron /ferritin, cbc in 2 months.

## 2017-06-12 ENCOUNTER — Ambulatory Visit: Payer: Medicaid Other | Admitting: Physical Therapy

## 2017-06-17 ENCOUNTER — Ambulatory Visit: Payer: Medicaid Other | Admitting: Physician Assistant

## 2017-06-18 ENCOUNTER — Encounter: Payer: Self-pay | Admitting: Family Medicine

## 2017-06-18 ENCOUNTER — Ambulatory Visit (INDEPENDENT_AMBULATORY_CARE_PROVIDER_SITE_OTHER): Payer: Medicaid Other | Admitting: Family Medicine

## 2017-06-18 VITALS — BP 118/84 | HR 86 | Temp 97.8°F | Ht 69.0 in | Wt 303.2 lb

## 2017-06-18 DIAGNOSIS — E10319 Type 1 diabetes mellitus with unspecified diabetic retinopathy without macular edema: Secondary | ICD-10-CM | POA: Diagnosis not present

## 2017-06-18 DIAGNOSIS — R110 Nausea: Secondary | ICD-10-CM

## 2017-06-18 DIAGNOSIS — L989 Disorder of the skin and subcutaneous tissue, unspecified: Secondary | ICD-10-CM

## 2017-06-18 LAB — HEMOGLOBIN A1C: Hgb A1c MFr Bld: 7.8 % — ABNORMAL HIGH (ref 4.6–6.5)

## 2017-06-18 MED ORDER — ONDANSETRON HCL 4 MG PO TABS: 4.0000 mg | ORAL_TABLET | Freq: Three times a day (TID) | ORAL | 0 refills | Status: DC | PRN

## 2017-06-18 NOTE — Progress Notes (Signed)
Chief Complaint  Patient presents with  . Follow-up    sores     Subjective Molly Savage is a 27 y.o. female who presents with vomiting. Symptoms began 1 week ago. Has been having nausea and one episode of vomiting w every BM. No diarrhea, blood in stool, constipation; she denies any foul odor to her stool compared to usual. She is not having any pain anywhere. She does not have bowel movements after eating and denies recent travel or medication change.  She used a cream on her skin and noticed no improvement other than resolution of itching.  She would prefer not to see another specialist at this time and wishes to have [place] perform a biopsy.  She has been taking her iron routinely and reports decreased consumption of ice and cigarette ashes.  Past Medical History:  Diagnosis Date  . Diabetes mellitus type 1 ([place])    Follows with Endo  . Tobacco abuse    Family History  Problem Relation Age of Onset  . Hypertension Mother   . Bipolar disorder Sister   . Heart disease Maternal Uncle   . ADD / ADHD Brother    Past Surgical History:  Procedure Laterality Date  . CESAREAN SECTION N/A 02/01/2016   Procedure: CESAREAN SECTION;  Surgeon: Mora Bellman, MD;  Location: [place];  Service: Obstetrics;  Laterality: N/A;   Current Outpatient Medications on File Prior to Visit  Medication Sig Dispense Refill  . acetaminophen (TYLENOL) 500 MG tablet Take 2,000 mg by mouth every 6 (six) hours as needed for moderate pain.     . busPIRone (BUSPAR) 10 MG tablet Take 1 tablet (10 mg total) by mouth 2 (two) times daily.    . divalproex (DEPAKOTE) 500 MG DR tablet Take 1 tablet (500 mg total) by mouth 2 (two) times daily.    . ferrous sulfate 324 (65 Fe) MG TBEC Take 1 tablet (325 mg total) by mouth daily. 30 tablet   . insulin aspart (NOVOLOG) 100 UNIT/ML injection Use as directed in insulin pump. MDD = 125 units. DX. E10.65    . insulin aspart (NOVOLOG) 100 UNIT/ML injection  USE AS DIRECTED IN INSULIN PUMP - MAXIMUM DAILY DOSE OF 125 UNITS    . Insulin Infusion Pump (MINIMED 630G INSULIN PUMP) KIT by Does not apply route.    . lurasidone (LATUDA) 20 MG TABS tablet Take 1 tablet (20 mg total) by mouth every evening. 60 tablet   . naproxen (NAPROSYN) 500 MG tablet Take 1 tablet (500 mg total) by mouth 2 (two) times daily with a meal. 30 tablet 2  . omeprazole (PRILOSEC) 20 MG capsule TAKE 1 CAPSULE BY MOUTH TWICE DAILY BEFORE A MEAL 180 capsule 1  . sertraline (ZOLOFT) 100 MG tablet Take 2 tablets (200 mg total) by mouth daily. 60 tablet 1  . traZODone (DESYREL) 50 MG tablet Take 0.5-1 tablets (25-50 mg total) by mouth at bedtime as needed for sleep. 30 tablet 3  . triamcinolone cream (KENALOG) 0.1 % Apply 1 application topically 2 (two) times daily. 30 g 0   No Known Allergies  Review of Systems Constitutional:  No fevers or chills Gastrointestinal:  As noted in the HPI  Exam BP 118/84 (BP Location: Left Arm, Patient Position: Sitting, Cuff Size: Large)   Pulse 86   Temp 97.8 F (36.6 C) (Oral)   Ht 5' 9"  (1.753 m)   Wt (!) 303 lb 4 oz (137.6 kg)   SpO2 98%   BMI  44.78 kg/m  General:  well developed, well hydrated, in no apparent distress Throat/Pharynx:  lips and gingiva without lesion; tongue and uvula midline; non-inflamed pharynx; no exudates or postnasal drainage Neck: neck supple without adenopathy, thyromegaly, or masses Lungs:  clear to auscultation, breath sounds equal bilaterally, no respiratory distress, no wheezes Cardio:  regular rate and rhythm without murmurs Abdomen:  abdomen soft, nontender; bowel sounds normal; no masses or organomegaly Skin: Erythematous patch x2 on LLE; no excessive warmth, tenderness to palpation, fluctuance, drainage, or scaling Psych: Appropriate judgement/insight  Assessment and Plan  Skin lesion of left leg  Nausea - Plan: ondansetron (ZOFRAN) 4 MG tablet  Type 1 diabetes mellitus with retinopathy, macular  edema presence unspecified, unspecified laterality, unspecified retinopathy severity ([place]) - Plan: Hemoglobin A1c  We will have the patient return at her earliest convenience for a biopsy.  We will take at least a single specimen from each lesion. There is no syndrome or illness that I am aware of that would cause vomiting/nausea concurrent with bowel movements.  If this continues, will refer to GI for their opinion. Check an A1c at the request of her podiatrist so she may undergo a procedure if necessary. Follow-up for biopsy. The patient voiced understanding and agreement to the plan.  [place], DO 06/18/17  5:02 PM

## 2017-06-18 NOTE — Progress Notes (Signed)
Pre visit review using our clinic review tool, if applicable. No additional management support is needed unless otherwise documented below in the visit note. 

## 2017-06-18 NOTE — Patient Instructions (Addendum)
Let us know if you need anything.  

## 2017-06-19 ENCOUNTER — Ambulatory Visit: Payer: Medicaid Other | Admitting: Physical Therapy

## 2017-06-19 ENCOUNTER — Encounter: Payer: Self-pay | Admitting: Family Medicine

## 2017-06-19 ENCOUNTER — Encounter: Payer: Self-pay | Admitting: Physical Therapy

## 2017-06-19 DIAGNOSIS — R29898 Other symptoms and signs involving the musculoskeletal system: Secondary | ICD-10-CM

## 2017-06-19 DIAGNOSIS — R262 Difficulty in walking, not elsewhere classified: Secondary | ICD-10-CM

## 2017-06-19 DIAGNOSIS — G8929 Other chronic pain: Secondary | ICD-10-CM

## 2017-06-19 DIAGNOSIS — M25562 Pain in left knee: Secondary | ICD-10-CM | POA: Diagnosis not present

## 2017-06-19 DIAGNOSIS — M25662 Stiffness of left knee, not elsewhere classified: Secondary | ICD-10-CM

## 2017-06-19 DIAGNOSIS — M25661 Stiffness of right knee, not elsewhere classified: Secondary | ICD-10-CM

## 2017-06-19 DIAGNOSIS — M25561 Pain in right knee: Secondary | ICD-10-CM

## 2017-06-19 NOTE — Therapy (Addendum)
[facility] [place] [address]  [place], [place], [address] Phone: [phone]   Fax:  [phone]  Physical Therapy Treatment  Patient Details  Name: Molly Savage MRN: [id number] Date of Birth: 06/19/1990 Referring Provider: Riki Sheer, DO   Encounter Date: 06/19/2017  PT End of Session - 06/19/17 1445    Visit Number  2    Number of Visits  7    Date for PT Re-Evaluation  07/11/17    Authorization Type  Medicaid (submitted for approval)    Authorization Time Period  06/05/17-06/25/17    Authorization - Visit Number  1    Authorization - Number of Visits  3    PT Start Time  1400    PT Stop Time  1450    PT Time Calculation (min)  50 min    Activity Tolerance  Patient tolerated treatment well    Behavior During Therapy  [facility] for tasks assessed/performed       Past Medical History:  Diagnosis Date  . Diabetes mellitus type 1 ([place])    Follows with Endo  . Tobacco abuse     Past Surgical History:  Procedure Laterality Date  . CESAREAN SECTION N/A 02/01/2016   Procedure: CESAREAN SECTION;  Surgeon: Mora Bellman, MD;  Location: [facility];  Service: Obstetrics;  Laterality: N/A;    There were no vitals filed for this visit.  Subjective Assessment - 06/19/17 1407    Subjective  Patient reports she has not been performing HEP because she lost her handout. Has picked up another job. Reports knee pain is about the same.     Diagnostic tests  None    Patient Stated Goals  walk and drive without pain    Currently in Pain?  Yes    Pain Score  5     Pain Location  Knee    Pain Orientation  Right    Pain Descriptors / Indicators  Aching    Pain Type  Chronic pain                       [facility] Adult PT Treatment/Exercise - 06/19/17 0001      Exercises   Exercises  Knee/Hip      Knee/Hip Exercises: Stretches   Passive Hamstring Stretch  Both;2 reps;20 seconds with strap    Gastroc Stretch  Both      Knee/Hip Exercises: Aerobic   Stationary Bike  L1x6 min      Knee/Hip Exercises: Standing   Wall Squat  1 set;10 reps VCs to activate ERs to avoid valgus collapse; and hyperex      Knee/Hip Exercises: Seated   Long Arc Quad  Both;1 set;10 reps VCs to avoid hyperextension      Knee/Hip Exercises: Supine   Quad Sets  Strengthening;Right;10 reps;Left 10 sec hold with towel under knee    Bridges  Strengthening;Both;1 set;15 reps    Single Leg Bridge  Strengthening;Both;1 set;10 reps bridge with alt LE marching; c/o back pain    Straight Leg Raises  Strengthening;Right;Left;1 set;10 reps    Other Supine Knee/Hip Exercises  TrA contraction in hooklying; edu x3 min      Knee/Hip Exercises: Sidelying   Hip ABduction  Strengthening;Both;1 set;10 reps TC/VCs to correct LE placement                  PT Long Term Goals - 05/28/17 1437  PT LONG TERM GOAL #1   Title  Patient to be independent with advanced HEP.    Time  6    Period  Weeks    Status  New    Target Date  07/11/17      PT LONG TERM GOAL #2   Title  Patient to demonstrate WNL hamstring flexibility in B LEs.    Time  6    Period  Weeks    Status  New    Target Date  07/11/17      PT LONG TERM GOAL #4   Title  Patient to demonstrate >=4+/5 strength in B LE knees & hips.    Time  6    Period  Weeks    Status  New    Target Date  07/11/17      PT LONG TERM GOAL #5   Title  Patient to tolerate 45-60 minutes of sitting without pain while working.     Time  6    Period  Weeks    Status  New    Target Date  07/11/17            Plan - 06/19/17 1456    Clinical Impression Statement  Patient arrived to appointment with report that she has not been compliant with HEP d/t losing her handout. Re-printed handout and sent exercises to patient's phone to ensure compliance. Patient tolerated gentle quad strengthening exercises without report of pain; observable weakness in L LE  compared to R. Educated patient on TrA activation this visit. C/o LBP with bridge with marching. Patient with prominent genu valgus of B knees, especially evident with gait and wall squats. Required VC to correct valgus collapse during mini wall squats. Patient reported soreness in B knees at end of session, but tolerable. Advised patient to start HEP at home.    PT Treatment/Interventions  Ultrasound;Cryotherapy;Electrical Stimulation;Iontophoresis 28m/ml Dexamethasone;Moist Heat;Gait training;Stair training;Functional mobility training;Therapeutic activities;Therapeutic exercise;Balance training;Neuromuscular re-education;Patient/family education;Manual techniques;Vasopneumatic Device;Taping;Dry needling;Passive range of motion    PT Next Visit Plan  Progress hip ER strengthening, cueing to avoid hyperextension and valgus collapse    Consulted and Agree with Plan of Care  Patient       Patient will benefit from skilled therapeutic intervention in order to improve the following deficits and impairments:  Abnormal gait, Decreased endurance, Hypomobility, Increased edema, Decreased activity tolerance, Decreased strength, Pain, Difficulty walking, Decreased range of motion, Impaired flexibility, Postural dysfunction  Visit Diagnosis: Chronic pain of left knee  Chronic pain of right knee  Stiffness of left knee, not elsewhere classified  Stiffness of right knee, not elsewhere classified  Difficulty in walking, not elsewhere classified  Other symptoms and signs involving the musculoskeletal system     Problem List Patient Active Problem List   Diagnosis Date Noted  . Skin lesion of left leg 06/18/2017  . Patellofemoral arthralgia of both knees 05/21/2017  . Left foot pain 05/21/2017  . Callus of foot 04/24/2017  . Morbid obesity ([place] 01/30/2017  . IUD contraception 09/11/2016  . Lactose intolerance 12/26/2015  . BMI 40.0-44.9, adult ([facility] 09/18/2015  . Anxiety with depression  07/30/2015  . Depression 07/30/2015  . Diabetic foot infection ([place] 04/17/2015  . Type 1 diabetes mellitus with retinopathy ([place] 04/16/2015  . Tobacco abuse   . Chronic hypertension 08/24/2014    YJanene Harvey PT, DPT 06/19/17 2:58 PM   [place] [address] [place] [address]  Phone: [phone]   Fax:  [phone]  Name: Molly Savage MRN: [id number] Date of Birth: 06-08-1990   PHYSICAL THERAPY DISCHARGE SUMMARY  Visits from Start of Care: 2  Current functional level related to goals / functional outcomes: See above clinical impression.   Remaining deficits: Unable to assess as patient reporting she does not want to return for further visits.   Education / Equipment: N/A Plan: Patient agrees to discharge.  Patient goals were not met. Patient is being discharged due to not returning since the last visit.  ?????     Janene Harvey, PT, DPT 06/26/17 12:15 PM

## 2017-06-20 ENCOUNTER — Telehealth: Payer: Self-pay | Admitting: Family Medicine

## 2017-06-20 NOTE — Telephone Encounter (Signed)
Copied from CRM [id number]. Topic: Quick Communication - Lab Results >> Jun 20, 2017 12:09 PM Debroah Loop wrote: Notified patient regarding lab results that Dr. Carmelia Roller or cma will call her back to review at earliest convenience. She has a foot appointment on Monday June 3rd which she needs the results for.

## 2017-06-20 NOTE — Telephone Encounter (Signed)
j

## 2017-06-20 NOTE — Telephone Encounter (Signed)
Called informed the patient of results/instructions.  

## 2017-06-20 NOTE — Telephone Encounter (Signed)
Copied from CRM [id number]. Topic: Quick Communication - Lab Results >> Jun 20, 2017 12:09 PM Debroah Savage, Molly L wrote: Notified patient regarding lab results that Dr. Carmelia RollerWendling or cma will call her back to review at earliest convenience. She has a foot appointment on Monday June 3rd which she needs the results for.

## 2017-06-20 NOTE — Telephone Encounter (Signed)
Let pt know her A1c is 7.8. This is higher than before, but for many it would not prevent the procedure. This will ultimately be up to Dr. Ardelle Anton. TY.

## 2017-06-23 ENCOUNTER — Ambulatory Visit (INDEPENDENT_AMBULATORY_CARE_PROVIDER_SITE_OTHER): Payer: Medicaid Other | Admitting: Podiatry

## 2017-06-23 DIAGNOSIS — M216X9 Other acquired deformities of unspecified foot: Secondary | ICD-10-CM

## 2017-06-23 DIAGNOSIS — L84 Corns and callosities: Secondary | ICD-10-CM | POA: Diagnosis not present

## 2017-06-23 NOTE — Patient Instructions (Signed)
Pre-Operative Instructions  Congratulations, you have decided to take an important step towards improving your quality of life.  You can be assured that the doctors and staff at [facility] will be with you every step of the way.  Here are some important things you should know:  1. Plan to be at the surgery center/hospital at least 1 (one) hour prior to your scheduled time, unless otherwise directed by the surgical center/hospital staff.  You must have a responsible adult accompany you, remain during the surgery and drive you home.  Make sure you have directions to the surgical center/hospital to ensure you arrive on time. 2. If you are having surgery at [facility] or [facility] hospitals, you will need a copy of your medical history and physical form from your family physician within one month prior to the date of surgery. We will give you a form for your primary physician to complete.  3. We make every effort to accommodate the date you request for surgery.  However, there are times where surgery dates or times have to be moved.  We will contact you as soon as possible if a change in schedule is required.   4. No aspirin/ibuprofen for one week before surgery.  If you are on aspirin, any non-steroidal anti-inflammatory medications (Mobic, Aleve, Ibuprofen) should not be taken seven (7) days prior to your surgery.  You make take Tylenol for pain prior to surgery.  5. Medications - If you are taking daily heart and blood pressure medications, seizure, reflux, allergy, asthma, anxiety, pain or diabetes medications, make sure you notify the surgery center/hospital before the day of surgery so they can tell you which medications you should take or avoid the day of surgery. 6. No food or drink after midnight the night before surgery unless directed otherwise by surgical center/hospital staff. 7. No alcoholic beverages 24-hours prior to surgery.  No smoking 24-hours prior or 24-hours after  surgery. 8. Wear loose pants or shorts. They should be loose enough to fit over bandages, boots, and casts. 9. Don't wear slip-on shoes. Sneakers are preferred. 10. Bring your boot with you to the surgery center/hospital.  Also bring crutches or a walker if your physician has prescribed it for you.  If you do not have this equipment, it will be provided for you after surgery. 11. If you have not been contacted by the surgery center/hospital by the day before your surgery, call to confirm the date and time of your surgery. 12. Leave-time from work may vary depending on the type of surgery you have.  Appropriate arrangements should be made prior to surgery with your employer. 13. Prescriptions will be provided immediately following surgery by your doctor.  Fill these as soon as possible after surgery and take the medication as directed. Pain medications will not be refilled on weekends and must be approved by the doctor. 14. Remove nail polish on the operative foot and avoid getting pedicures prior to surgery. 15. Wash the night before surgery.  The night before surgery wash the foot and leg well with water and the antibacterial soap provided. Be sure to pay special attention to beneath the toenails and in between the toes.  Wash for at least three (3) minutes. Rinse thoroughly with water and dry well with a towel.  Perform this wash unless told not to do so by your physician.  Enclosed: 1 Ice pack (please put in freezer the night before surgery)   1 Hibiclens skin cleaner     Pre-op instructions  If you have any questions regarding the instructions, please do not hesitate to call our office.  [place]: [address], [place], [place] [address] -- [phone]  [place]: [address] [place], [place] [address] -- [phone]  [place]: [address]  [place], [place] [address] -- [phone]  [place]: [address], [address], [place],  [address] -- [phone]  Website:  [email or web address] 

## 2017-06-24 ENCOUNTER — Ambulatory Visit: Payer: Medicaid Other | Admitting: Podiatry

## 2017-06-25 ENCOUNTER — Ambulatory Visit: Payer: Medicaid Other | Admitting: Physical Therapy

## 2017-06-25 NOTE — Progress Notes (Signed)
Subjective: Molly Savage presents the office today for residual consultation.  She states that the left foot is been hurting quite a bit on a daily basis and she was proceed with surgery to help decrease the pain and pressure on the area the callus.  This been a chronic issue for her she has tried shoe modifications, offloading padding and periodic debridement but a significant improvement.  She has been hospitalized previously due to infection in the area once he gets thick.  Denies any systemic complaints such as fevers, chills, nausea, vomiting. No acute changes since last appointment, and no other complaints at this time.   Objective: AAO x3, NAD DP/PT pulses palpable bilaterally, CRT less than 3 seconds Tenderness submetatarsal 5 as well as lateral aspect of the fifth metatarsal head of the left foot and there is hyperkeratotic tissue on the area.  Upon debridement there is no underlying ulceration drainage or any signs of infection.  There is no edema to the area.  There is prominence of metatarsal head.  Also similar issue on the right foot but not as significant.  No open lesions or pre-ulcerative lesions.  No pain with calf compression, swelling, warmth, erythema  Assessment: Fifth metatarsal head problems resulting in a pre-ulcerative callus  Plan: -All treatment options discussed with the patient including all alternatives, risks, complications.  -At this time I did debride the hyperkeratotic lesions of the courtesy without any complications of bleeding.  We discussed with conservative as well as surgical options.  She wants to proceed with surgery to help decrease the pain deformity although she understands risks of this which we discussed in detail today.  We will put under the fifth metatarsal head resection on the left side. -The incision placement as well as the postoperative course was discussed with the patient. I discussed risks of the surgery which include, but not limited to, infection,  bleeding, pain, swelling, need for further surgery, delayed or nonhealing, painful or ugly scar, numbness or sensation changes, over/under correction, recurrence, transfer lesions, further deformity, hardware failure, DVT/PE, loss of toe/foot. Patient understands these risks and wishes to proceed with surgery. The surgical consent was reviewed with the patient all 3 pages were signed. No promises or guarantees were given to the outcome of the procedure. All questions were answered to the best of my ability. Before the surgery the patient was encouraged to call the office if there is any further questions. [facility]. -Patient encouraged to call the office with any questions, concerns, change in symptoms.   Vivi BarrackMatthew R Gorgeous [place] DPM

## 2017-06-30 ENCOUNTER — Ambulatory Visit: Payer: Medicaid Other | Admitting: Family Medicine

## 2017-06-30 ENCOUNTER — Encounter: Payer: Self-pay | Admitting: Family Medicine

## 2017-06-30 ENCOUNTER — Telehealth: Payer: Self-pay | Admitting: Podiatry

## 2017-06-30 ENCOUNTER — Other Ambulatory Visit: Payer: Self-pay | Admitting: Family Medicine

## 2017-06-30 DIAGNOSIS — L089 Local infection of the skin and subcutaneous tissue, unspecified: Secondary | ICD-10-CM

## 2017-06-30 DIAGNOSIS — L989 Disorder of the skin and subcutaneous tissue, unspecified: Secondary | ICD-10-CM

## 2017-06-30 MED ORDER — MELOXICAM 15 MG PO TABS: 15.0000 mg | ORAL_TABLET | Freq: Every day | ORAL | 2 refills | Status: DC

## 2017-06-30 NOTE — Telephone Encounter (Signed)
I'm scheduled for surgery on 26 June but I don't know what time the surgery is. I explained that the surgical center calls 24 - 48 hours before surgery to let her know what time to arrive. It depends on if they have any changes to their schedules or if they have any surgery pt's that are kids and/or are diabetic as they go first. Pt stated she was a type 1 diabetic and wanted to know would she be able to have surgery earlier in the morning? The pt then asked if she would be seen by Dr. Ardelle AntonWagoner after her surgery? I told her I was not sure about the surgical center but she would have her first post op scheduled with Dr. Ardelle AntonWagoner between 5 - 7 days after her surgery. Pt then wanted to know if Dr. Ardelle AntonWagoner had any openings for her surgery before 26 June. When I offered to send her to our surgical coordinator Delydia, pt stated she would just leave it as she already told her boss she would be having surgery that day. I told the pt to call us with any other questions she may have.

## 2017-06-30 NOTE — Progress Notes (Addendum)
Chief Complaint  Patient presents with  . Procedure    punch biopsy   Patient is here for follow-up skin lesion.  I offered to biopsy at versus refer to dermatology and she opted for the former.  No questions.  Gen- awake, alert Psych-age appropriate judgment and insight Skin- see below, no scaling, ttp, fluctuance, excessive warmth   LLE  Procedure note; punch biopsy x2, one from each lesion above Informed consent was obtained. The area was cleaned with alcohol and then injected with 0.5 mL of 1% lidocaine with epinephrine. Vertical traction was placed along the skin and a 2 mm punch biopsy was used. The specimen was grasped with forceps and separated with iris scissors. The specimen was placed in a sterile specimen cup and sent to the lab. The area was dressed.  This was repeated on the proximal lesion. There were no complications noted. The patient tolerated the procedure well.   Skin lesion - Plan: Dermatology pathology  Orders as above. After care instructions provided. 1 business week to get results of labs back. F/u prn. The patient voiced understanding and agreement to the plan.  Molly Savage 8:18 AM 07/10/17

## 2017-06-30 NOTE — Patient Instructions (Addendum)
Do not shower for the rest of the day. When you do wash it, use only soap and water. Do not vigorously scrub. Apply triple antibiotic ointment (like Neosporin) twice daily. Keep the area clean and dry.   Things to look out for: increasing pain not relieved by ibuprofen/acetaminophen, fevers, spreading redness, drainage of pus, or foul odor.  Give us 1 business week to get the results of your biopsies back.  Let us know if you need anything.  EXERCISES  RANGE OF MOTION (ROM) AND STRETCHING EXERCISES - Low Back Pain Most people with lower back pain will find that their symptoms get worse with excessive bending forward (flexion) or arching at the lower back (extension). The exercises that will help resolve your symptoms will focus on the opposite motion.  If you have pain, numbness or tingling which travels down into your buttocks, leg or foot, the goal of the therapy is for these symptoms to move closer to your back and eventually resolve. Sometimes, these leg symptoms will get better, but your lower back pain may worsen. This is often an indication of progress in your rehabilitation. Be very alert to any changes in your symptoms and the activities in which you participated in the 24 hours prior to the change. Sharing this information with your caregiver will allow him or her to most efficiently treat your condition. These exercises may help you when beginning to rehabilitate your injury. Your symptoms may resolve with or without further involvement from your physician, physical therapist or athletic trainer. While completing these exercises, remember:   Restoring tissue flexibility helps normal motion to return to the joints. This allows healthier, less painful movement and activity.  An effective stretch should be held for at least 30 seconds.  A stretch should never be painful. You should only feel a gentle lengthening or release in the stretched tissue. FLEXION RANGE OF MOTION AND STRETCHING  EXERCISES:  STRETCH - Flexion, Single Knee to Chest   Lie on a firm bed or floor with both legs extended in front of you.  Keeping one leg in contact with the floor, bring your opposite knee to your chest. Hold your leg in place by either grabbing behind your thigh or at your knee.  Pull until you feel a gentle stretch in your low back. Hold 30 seconds.  Slowly release your grasp and repeat the exercise with the opposite side. Repeat 2 times. Complete this exercise 3 times per week.   STRETCH - Flexion, Double Knee to Chest  Lie on a firm bed or floor with both legs extended in front of you.  Keeping one leg in contact with the floor, bring your opposite knee to your chest.  Tense your stomach muscles to support your back and then lift your other knee to your chest. Hold your legs in place by either grabbing behind your thighs or at your knees.  Pull both knees toward your chest until you feel a gentle stretch in your low back. Hold 30 seconds.  Tense your stomach muscles and slowly return one leg at a time to the floor. Repeat 2 times. Complete this exercise 3 times per week.   STRETCH - Low Trunk Rotation  Lie on a firm bed or floor. Keeping your legs in front of you, bend your knees so they are both pointed toward the ceiling and your feet are flat on the floor.  Extend your arms out to the side. This will stabilize your upper body by keeping your  shoulders in contact with the floor.  Gently and slowly drop both knees together to one side until you feel a gentle stretch in your low back. Hold for 30 seconds.  Tense your stomach muscles to support your lower back as you bring your knees back to the starting position. Repeat the exercise to the other side. Repeat 2 times. Complete this exercise at least 3 times per week.   EXTENSION RANGE OF MOTION AND FLEXIBILITY EXERCISES:  STRETCH - Extension, Prone on Elbows   Lie on your stomach on the floor, a bed will be too soft.  Place your palms about shoulder width apart and at the height of your head.  Place your elbows under your shoulders. If this is too painful, stack pillows under your chest.  Allow your body to relax so that your hips drop lower and make contact more completely with the floor.  Hold this position for 30 seconds.  Slowly return to lying flat on the floor. Repeat 2 times. Complete this exercise 3 times per week.   RANGE OF MOTION - Extension, Prone Press Ups  Lie on your stomach on the floor, a bed will be too soft. Place your palms about shoulder width apart and at the height of your head.  Keeping your back as relaxed as possible, slowly straighten your elbows while keeping your hips on the floor. You may adjust the placement of your hands to maximize your comfort. As you gain motion, your hands will come more underneath your shoulders.  Hold this position 30 seconds.  Slowly return to lying flat on the floor. Repeat 2 times. Complete this exercise 3 times per week.   RANGE OF MOTION- Quadruped, Neutral Spine   Assume a hands and knees position on a firm surface. Keep your hands under your shoulders and your knees under your hips. You may place padding under your knees for comfort.  Drop your head and point your tailbone toward the ground below you. This will round out your lower back like an angry cat. Hold this position for 30 seconds.  Slowly lift your head and release your tail bone so that your back sags into a large arch, like an old horse.  Hold this position for 30 seconds.  Repeat this until you feel limber in your low back.  Now, find your "sweet spot." This will be the most comfortable position somewhere between the two previous positions. This is your neutral spine. Once you have found this position, tense your stomach muscles to support your low back.  Hold this position for 30 seconds. Repeat 2 times. Complete this exercise 3 times per week.   STRENGTHENING  EXERCISES - Low Back Sprain These exercises may help you when beginning to rehabilitate your injury. These exercises should be done near your "sweet spot." This is the neutral, low-back arch, somewhere between fully rounded and fully arched, that is your least painful position. When performed in this safe range of motion, these exercises can be used for people who have either a flexion or extension based injury. These exercises may resolve your symptoms with or without further involvement from your physician, physical therapist or athletic trainer. While completing these exercises, remember:   Muscles can gain both the endurance and the strength needed for everyday activities through controlled exercises.  Complete these exercises as instructed by your physician, physical therapist or athletic trainer. Increase the resistance and repetitions only as guided.  You may experience muscle soreness or fatigue, but the pain  or discomfort you are trying to eliminate should never worsen during these exercises. If this pain does worsen, stop and make certain you are following the directions exactly. If the pain is still present after adjustments, discontinue the exercise until you can discuss the trouble with your caregiver.  STRENGTHENING - Deep Abdominals, Pelvic Tilt   Lie on a firm bed or floor. Keeping your legs in front of you, bend your knees so they are both pointed toward the ceiling and your feet are flat on the floor.  Tense your lower abdominal muscles to press your low back into the floor. This motion will rotate your pelvis so that your tail bone is scooping upwards rather than pointing at your feet or into the floor. With a gentle tension and even breathing, hold this position for 3 seconds. Repeat 2 times. Complete this exercise 3 times per week.   STRENGTHENING - Abdominals, Crunches   Lie on a firm bed or floor. Keeping your legs in front of you, bend your knees so they are both pointed  toward the ceiling and your feet are flat on the floor. Cross your arms over your chest.  Slightly tip your chin down without bending your neck.  Tense your abdominals and slowly lift your trunk high enough to just clear your shoulder blades. Lifting higher can put excessive stress on the lower back and does not further strengthen your abdominal muscles.  Control your return to the starting position. Repeat 2 times. Complete this exercise 3 times per week.   STRENGTHENING - Quadruped, Opposite UE/LE Lift   Assume a hands and knees position on a firm surface. Keep your hands under your shoulders and your knees under your hips. You may place padding under your knees for comfort.  Find your neutral spine and gently tense your abdominal muscles so that you can maintain this position. Your shoulders and hips should form a rectangle that is parallel with the floor and is not twisted.  Keeping your trunk steady, lift your right hand no higher than your shoulder and then your left leg no higher than your hip. Make sure you are not holding your breath. Hold this position for 30 seconds.  Continuing to keep your abdominal muscles tense and your back steady, slowly return to your starting position. Repeat with the opposite arm and leg. Repeat 2 times. Complete this exercise 3 times per week.   STRENGTHENING - Abdominals and Quadriceps, Straight Leg Raise   Lie on a firm bed or floor with both legs extended in front of you.  Keeping one leg in contact with the floor, bend the other knee so that your foot can rest flat on the floor.  Find your neutral spine, and tense your abdominal muscles to maintain your spinal position throughout the exercise.  Slowly lift your straight leg off the floor about 6 inches for a count of 3, making sure to not hold your breath.  Still keeping your neutral spine, slowly lower your leg all the way to the floor. Repeat this exercise with each leg 2 times. Complete this  exercise 3 times per week.  POSTURE AND BODY MECHANICS CONSIDERATIONS - Low Back Sprain Keeping correct posture when sitting, standing or completing your activities will reduce the stress put on different body tissues, allowing injured tissues a chance to heal and limiting painful experiences. The following are general guidelines for improved posture.  While reading these guidelines, remember:  The exercises prescribed by your provider will help you  have the flexibility and strength to maintain correct postures.  The correct posture provides the best environment for your joints to work. All of your joints have less wear and tear when properly supported by a spine with good posture. This means you will experience a healthier, less painful body.  Correct posture must be practiced with all of your activities, especially prolonged sitting and standing. Correct posture is as important when doing repetitive low-stress activities (typing) as it is when doing a single heavy-load activity (lifting).  RESTING POSITIONS Consider which positions are most painful for you when choosing a resting position. If you have pain with flexion-based activities (sitting, bending, stooping, squatting), choose a position that allows you to rest in a less flexed posture. You would want to avoid curling into a fetal position on your side. If your pain worsens with extension-based activities (prolonged standing, working overhead), avoid resting in an extended position such as sleeping on your stomach. Most people will find more comfort when they rest with their spine in a more neutral position, neither too rounded nor too arched. Lying on a non-sagging bed on your side with a pillow between your knees, or on your back with a pillow under your knees will often provide some relief. Keep in mind, being in any one position for a prolonged period of time, no matter how correct your posture, can still lead to stiffness.  PROPER SITTING  POSTURE In order to minimize stress and discomfort on your spine, you must sit with correct posture. Sitting with good posture should be effortless for a healthy body. Returning to good posture is a gradual process. Many people can work toward this most comfortably by using various supports until they have the flexibility and strength to maintain this posture on their own. When sitting with proper posture, your ears will fall over your shoulders and your shoulders will fall over your hips. You should use the back of the chair to support your upper back. Your lower back will be in a neutral position, just slightly arched. You may place a small pillow or folded towel at the base of your lower back for  support.  When working at a desk, create an environment that supports good, upright posture. Without extra support, muscles tire, which leads to excessive strain on joints and other tissues. Keep these recommendations in mind:  CHAIR:  A chair should be able to slide under your desk when your back makes contact with the back of the chair. This allows you to work closely.  The chair's height should allow your eyes to be level with the upper part of your monitor and your hands to be slightly lower than your elbows.  BODY POSITION  Your feet should make contact with the floor. If this is not possible, use a foot rest.  Keep your ears over your shoulders. This will reduce stress on your neck and low back.  INCORRECT SITTING POSTURES  If you are feeling tired and unable to assume a healthy sitting posture, do not slouch or slump. This puts excessive strain on your back tissues, causing more damage and pain. Healthier options include:  Using more support, like a lumbar pillow.  Switching tasks to something that requires you to be upright or walking.  Talking a brief walk.  Lying down to rest in a neutral-spine position.  PROLONGED STANDING WHILE SLIGHTLY LEANING FORWARD  When completing a task  that requires you to lean forward while standing in one place for a  long time, place either foot up on a stationary 2-4 inch high object to help maintain the best posture. When both feet are on the ground, the lower back tends to lose its slight inward curve. If this curve flattens (or becomes too large), then the back and your other joints will experience too much stress, tire more quickly, and can cause pain.  CORRECT STANDING POSTURES Proper standing posture should be assumed with all daily activities, even if they only take a few moments, like when brushing your teeth. As in sitting, your ears should fall over your shoulders and your shoulders should fall over your hips. You should keep a slight tension in your abdominal muscles to brace your spine. Your tailbone should point down to the ground, not behind your body, resulting in an over-extended swayback posture.   INCORRECT STANDING POSTURES  Common incorrect standing postures include a forward head, locked knees and/or an excessive swayback. WALKING Walk with an upright posture. Your ears, shoulders and hips should all line-up.  PROLONGED ACTIVITY IN A FLEXED POSITION When completing a task that requires you to bend forward at your waist or lean over a low surface, try to find a way to stabilize 3 out of 4 of your limbs. You can place a hand or elbow on your thigh or rest a knee on the surface you are reaching across. This will provide you more stability, so that your muscles do not tire as quickly. By keeping your knees relaxed, or slightly bent, you will also reduce stress across your lower back. CORRECT LIFTING TECHNIQUES  DO :  Assume a wide stance. This will provide you more stability and the opportunity to get as close as possible to the object which you are lifting.  Tense your abdominals to brace your spine. Bend at the knees and hips. Keeping your back locked in a neutral-spine position, lift using your leg muscles. Lift with your  legs, keeping your back straight.  Test the weight of unknown objects before attempting to lift them.  Try to keep your elbows locked down at your sides in order get the best strength from your shoulders when carrying an object.     Always ask for help when lifting heavy or awkward objects. INCORRECT LIFTING TECHNIQUES DO NOT:   Lock your knees when lifting, even if it is a small object.  Bend and twist. Pivot at your feet or move your feet when needing to change directions.  Assume that you can safely pick up even a paperclip without proper posture.

## 2017-07-02 ENCOUNTER — Telehealth: Payer: Self-pay | Admitting: Family Medicine

## 2017-07-02 NOTE — Telephone Encounter (Signed)
Called the lab and informed both samples were from the same leg

## 2017-07-02 NOTE — Telephone Encounter (Signed)
Copied from CRM [id number]. Topic: Quick Communication - See Telephone Encounter >> Jul 02, 2017  8:47 AM Louie BunPalacios Medina, Rosey Batheresa D wrote: CRM for notification. See Telephone encounter for: 07/02/17. [facility] and would like to talk to Dr. Hollie BeachWendling's CMA about biopsy results she got from office about patient. She can be reached at [phone]

## 2017-07-10 ENCOUNTER — Encounter: Payer: Self-pay | Admitting: Family Medicine

## 2017-07-10 ENCOUNTER — Ambulatory Visit: Payer: Medicaid Other | Admitting: Family Medicine

## 2017-07-10 VITALS — BP 112/76 | HR 88 | Temp 98.0°F | Ht 69.0 in | Wt 297.1 lb

## 2017-07-10 DIAGNOSIS — R413 Other amnesia: Secondary | ICD-10-CM | POA: Insufficient documentation

## 2017-07-10 DIAGNOSIS — L920 Granuloma annulare: Secondary | ICD-10-CM | POA: Diagnosis not present

## 2017-07-10 DIAGNOSIS — F418 Other specified anxiety disorders: Secondary | ICD-10-CM | POA: Diagnosis not present

## 2017-07-10 LAB — COMPREHENSIVE METABOLIC PANEL
ALT: 11 U/L (ref 0–35)
AST: 10 U/L (ref 0–37)
Albumin: 3.7 g/dL (ref 3.5–5.2)
Alkaline Phosphatase: 84 U/L (ref 39–117)
BUN: 14 mg/dL (ref 6–23)
CO2: 24 mEq/L (ref 19–32)
Calcium: 8.8 mg/dL (ref 8.4–10.5)
Chloride: 105 mEq/L (ref 96–112)
Creatinine, Ser: 0.6 mg/dL (ref 0.40–1.20)
GFR: 125.82 mL/min (ref >60.00)
Glucose, Bld: 192 mg/dL — ABNORMAL HIGH (ref 70–99)
Potassium: 4.4 mEq/L (ref 3.5–5.1)
Sodium: 135 mEq/L (ref 135–145)
Total Bilirubin: 0.2 mg/dL (ref 0.2–1.2)
Total Protein: 6.3 g/dL (ref 6.0–8.3)

## 2017-07-10 LAB — CBC
HCT: 39.5 % (ref 36.0–46.0)
Hemoglobin: 12.7 g/dL (ref 12.0–15.0)
MCHC: 32.3 g/dL (ref 30.0–36.0)
MCV: 82 fl (ref 78.0–100.0)
Platelets: 222 10*3/uL (ref 150.0–400.0)
RBC: 4.81 Mil/uL (ref 3.87–5.11)
RDW: 21.2 % — ABNORMAL HIGH (ref 11.5–15.5)
WBC: 7.9 10*3/uL (ref 4.0–10.5)

## 2017-07-10 LAB — TSH: TSH: 2 u[IU]/mL (ref 0.35–4.50)

## 2017-07-10 LAB — VITAMIN B12: Vitamin B-12: 435 pg/mL (ref 211–911)

## 2017-07-10 MED ORDER — DIVALPROEX SODIUM 500 MG PO DR TAB: 500.0000 mg | DELAYED_RELEASE_TABLET | Freq: Two times a day (BID) | ORAL | 1 refills | Status: DC

## 2017-07-10 MED ORDER — BUSPIRONE HCL 10 MG PO TABS: 10.0000 mg | ORAL_TABLET | Freq: Two times a day (BID) | ORAL | 1 refills | Status: DC

## 2017-07-10 MED ORDER — ONDANSETRON HCL 4 MG PO TABS: 4.0000 mg | ORAL_TABLET | Freq: Three times a day (TID) | ORAL | 0 refills | Status: DC | PRN

## 2017-07-10 MED ORDER — CLOBETASOL PROPIONATE 0.05 % EX CREA: 1.0000 "application " | TOPICAL_CREAM | Freq: Two times a day (BID) | CUTANEOUS | 0 refills | Status: AC

## 2017-07-10 MED ORDER — NAPROXEN 500 MG PO TABS
500.0000 mg | ORAL_TABLET | Freq: Two times a day (BID) | ORAL | 0 refills | Status: DC
Start: 2017-07-10 — End: 2017-10-17

## 2017-07-10 NOTE — Patient Instructions (Addendum)
You may need to contact your psychiatrist regarding the support dog. Ask him if he thinks anything he is treating you for could be contributing to your memory issue.  We will be in contact with your lab results.   Let us know if you need anything.

## 2017-07-10 NOTE — Progress Notes (Signed)
Chief Complaint  Patient presents with  . Results    Subjective: Patient is a 27 y.o. female here for med visit.  Patient has a history of anxiety and depression for which she follows psychiatry for.  She needs refills of medications and is unable to get in with a specialist until next month.  She feels she is doing well on Depakote, BuSpar, Latuda and Zoloft.  Patient states she uses her dog as an emotional support animal and would like me to write a letter stating such.  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  We did not use a high enough potency corticosteroid.  Her sites of biopsy are healing well.  She does not have any issues.  She also reports she has been having a poor memory.  She was diagnosed with type 2 diabetes before they realize it was actually type 1 diabetes.  She is worried that prolonged period of time with very elevated sugars has caused irreversible damage to her mind.  She has not brought this up with her psychiatrist.   ROS: Psych: No anxiety Neuro: +memory loss  Past Medical History:  Diagnosis Date  . Diabetes mellitus type 1 (HCC)    Follows with Endo  . Tobacco abuse     Objective: BP 112/76 (BP Location: Left Arm, Patient Position: Sitting, Cuff Size: Large)   Pulse 88   Temp 98 F (36.7 C) (Oral)   Ht 5\' 9"  (1.753 m)   Wt 297 lb 2 oz (134.8 kg)   SpO2 97%   BMI 43.88 kg/m  General: Awake, appears stated age HEENT: MMM, EOMi Heart: RRR Lungs: CTAB, no rales, wheezes or rhonchi. No accessory muscle use Skin: Well healing areas at biopsy site. No change in lesions. Psych: Age appropriate judgment and insight, normal affect and mood  Assessment and Plan: Granuloma annulare - Plan: clobetasol cream (TEMOVATE) 0.05 %  Anxiety with depression - Plan: busPIRone (BUSPAR) 10 MG tablet, divalproex (DEPAKOTE) 500 MG DR tablet  Poor memory - Plan: Comprehensive metabolic panel, TSH, B12, CBC  4 weeks of hypodensity topical steroid twice  daily.  If no improvement, will do intralesional corticosteroid's. Refill medication.  I did write a letter, but basically stated that she has a doctor uses for emotional support.  This probably will not work with her apartment complex.  I did state she should follow-up with her psychiatrist for a better letter. For her memory, I would like her to bring up with her psychiatrist.  This could be related to her anxiety and depression.  We will check to rule out metabolic causes.  We will consider referral to behavioral health for evaluation/dx. The patient voiced understanding and agreement to the plan.  Jilda Rocheicholas Paul PowersWendling, DO 07/10/17  2:27 PM

## 2017-07-10 NOTE — Progress Notes (Signed)
Pre visit review using our clinic review tool, if applicable. No additional management support is needed unless otherwise documented below in the visit note. 

## 2017-07-11 ENCOUNTER — Telehealth: Payer: Self-pay | Admitting: *Deleted

## 2017-07-11 NOTE — Telephone Encounter (Signed)
Received Dermatopathology Report results from [facility]; forwarded to provider/SLS 06/21     

## 2017-07-16 ENCOUNTER — Encounter: Payer: Self-pay | Admitting: Podiatry

## 2017-07-16 ENCOUNTER — Other Ambulatory Visit: Payer: Self-pay | Admitting: Podiatry

## 2017-07-16 DIAGNOSIS — M21542 Acquired clubfoot, left foot: Secondary | ICD-10-CM | POA: Diagnosis not present

## 2017-07-16 MED ORDER — PROMETHAZINE HCL 25 MG PO TABS: 25.0000 mg | ORAL_TABLET | Freq: Three times a day (TID) | ORAL | 0 refills | Status: DC | PRN

## 2017-07-16 MED ORDER — CEPHALEXIN 500 MG PO CAPS: 500.0000 mg | ORAL_CAPSULE | Freq: Three times a day (TID) | ORAL | 0 refills | Status: DC

## 2017-07-16 MED ORDER — OXYCODONE HCL 5 MG PO TABS: 5.0000 mg | ORAL_TABLET | ORAL | 0 refills | Status: DC | PRN

## 2017-07-16 NOTE — Progress Notes (Signed)
Pre-operative Note  [facility] for surgical intervention of the LEFT foot for 5th metatarsal head excision. The surgical consent was reviewed with the patient and we discussed the procedure as well as the postoperative course. I again discussed all alternatives, risks, complications. I answered all of their questions to the best of my ability and they wish to proceed with surgery. No promises or guarantees were given as to the outcome of the surgery.   The surgical consent was signed.   Patient is NPO since midnight.  The patient does not have have a history of blood clots or bleeding disorders.   Postop medications sent to the pharmacy  Her boyfriend will be here and gave me permission to talk to him if he is here. He had left the facility therefore could not talk to him before the surgery.   No further questions.   Ovid CurdMatthew Wagoner, DPM [facility]

## 2017-07-17 ENCOUNTER — Telehealth: Payer: Self-pay | Admitting: *Deleted

## 2017-07-17 ENCOUNTER — Telehealth: Payer: Self-pay | Admitting: Podiatry

## 2017-07-17 NOTE — Telephone Encounter (Addendum)
Pt states in the boot, there is numbness and the toes are throbbing. I told pt that in all probability the ace that was applied after surgery to staunch post op bleeding and swelling, has done it's job it is now too snug. I told pt to remove the cam boot, open-ended sock, ace wrap and elevate the foot for 15 minutes, after 15 minutes lower foot to hip level and rewrap the ace looser beginning at the toes moving up the legs reapply sock and cam boot. I told pt that if during this period of elevating the foot the pain, worsens dangle the foot for 15 minutes, then place level with the hip and rewrap and reapply the ace and sock and boot. I told pt she should continue to ice and if tolerated ibuprofen to take as OTC package instructs in between dosing of pain medication.

## 2017-07-17 NOTE — Telephone Encounter (Signed)
Called the patient and the patient spoke with Dr Ardelle AntonWagoner twice last night and I stated that I was calling to see how she was doing and patient was doing better since talking to Dr Ardelle AntonWagoner and the pain was an 8 and I am icing and elevating and I am taken the pain meds and I stated to call if any concerns or questions [phone]. Molly StanleyLisa

## 2017-07-17 NOTE — Telephone Encounter (Signed)
I had surgery yesterday with Dr. Ardelle AntonWagoner. The oxycodone that he prescribed me is not helping with the pain whatsoever. He said if didn't help to call. I was wondering if there was something else he could prescribe me to help me with the pain? He gave me straight oxy and its doing nothing. My number is [phone]. Thank you.

## 2017-07-21 ENCOUNTER — Ambulatory Visit (INDEPENDENT_AMBULATORY_CARE_PROVIDER_SITE_OTHER): Payer: Medicaid Other | Admitting: Podiatry

## 2017-07-21 ENCOUNTER — Ambulatory Visit (INDEPENDENT_AMBULATORY_CARE_PROVIDER_SITE_OTHER): Payer: Medicaid Other

## 2017-07-21 DIAGNOSIS — M216X2 Other acquired deformities of left foot: Secondary | ICD-10-CM

## 2017-07-21 DIAGNOSIS — Z9889 Other specified postprocedural states: Secondary | ICD-10-CM

## 2017-07-21 DIAGNOSIS — M216X9 Other acquired deformities of unspecified foot: Secondary | ICD-10-CM

## 2017-07-27 NOTE — Progress Notes (Signed)
   Subjective:  Patient presents today status post 5th metatarsal head resection left. DOS: 07/16/17. She reports continued nausea that has been present daily since surgery. She has no other concerns regarding her surgery at this time. Patient is here for further evaluation and treatment.    Past Medical History:  Diagnosis Date  . Diabetes mellitus type 1 (HCC)    Follows with Endo  . Tobacco abuse       Objective/Physical Exam Neurovascular status intact.  Skin incisions appear to be well coapted with sutures and staples intact. No sign of infectious process noted. No dehiscence. No active bleeding noted. Moderate edema noted to the surgical extremity.  Radiographic Exam:  Orthopedic hardware and osteotomies sites appear to be stable with routine healing.  Assessment: 1. s/p 5th metatarsal head resection left. DOS: 07/16/17   Plan of Care:  1. Patient was evaluated. X-rays reviewed 2. Dressing changed. Keep clean, dry and intact for one week.  3. Continue taking oral antibiotics until completed.  4. Continue weightbearing in CAM boot.  5. Return to clinic in one week with Dr. Ardelle AntonWagoner.    Felecia ShellingBrent M. Sunita Demond, DPM [facility]  Dr. Felecia ShellingBrent M. Cielo Arias, DPM    [address]                                        [place], [place] [address]                Office [phone]  Fax [phone]

## 2017-07-28 ENCOUNTER — Ambulatory Visit (INDEPENDENT_AMBULATORY_CARE_PROVIDER_SITE_OTHER): Payer: Medicaid Other | Admitting: Podiatry

## 2017-07-28 ENCOUNTER — Encounter: Payer: Self-pay | Admitting: Podiatry

## 2017-07-28 VITALS — BP 141/75 | HR 96 | Temp 98.1°F | Resp 18

## 2017-07-28 DIAGNOSIS — Z9889 Other specified postprocedural states: Secondary | ICD-10-CM

## 2017-07-28 DIAGNOSIS — M216X9 Other acquired deformities of unspecified foot: Secondary | ICD-10-CM

## 2017-07-29 NOTE — Progress Notes (Signed)
Subjective: Molly Savage is a 27 y.o. is seen today in office s/p left 5th metatarsal head resection preformed on 07/16/2017. She states her pain is much improved. She reports some occasional pain but doing well. She is asking for something smaller than the CAM boot to wear.  Denies any systemic complaints such as fevers, chills, nausea, vomiting. No calf pain, chest pain, shortness of breath.   Objective: General: No acute distress, AAOx3  DP/PT pulses palpable 2/4, CRT < 3 sec to all digits.  Protective sensation intact. Motor function intact.  LEFT foot: Incision is well coapted without any evidence of dehiscence with sutures intact. There is no surrounding erythema, ascending cellulitis, fluctuance, crepitus, malodor, drainage/purulence. There is minmal edema around the surgical site. There is no pain along the surgical site. There are no clinical signs of infection and it appears to be healing well without any issues.  No other areas of tenderness to bilateral lower extremities.  No other open lesions or pre-ulcerative lesions.  No pain with calf compression, swelling, warmth, erythema.   Assessment and Plan:  Status post left foot 5th metatarsal head resection, doing well with no complications   -Treatment options discussed including all alternatives, risks, and complications -I reviewed the x-rays with the patient from last appointment. -Sutures remain intact.  Antibiotic ointment and a bandage was applied.  Keep the dressing clean, dry, intact. -Dispensed surgical shoe for her to wear. -Ice/elevation -Pain medication as needed. -Monitor for any clinical signs or symptoms of infection and DVT/PE and directed to call the office immediately should any occur or go to the ER. -Follow-up in 1 week for suture removal or sooner if any problems arise. In the meantime, encouraged to call the office with any questions, concerns, change in symptoms.   *She is asking about when we would be  able to do surgery for the right foot.  When she is back into a shoe with good results on the left foot that she wants to do the right foot.  Today we also discussed we will do diabetic insert after the incision to transfer lesions.  Ovid CurdMatthew Charina Fons, DPM

## 2017-07-31 ENCOUNTER — Encounter: Payer: Self-pay | Admitting: Family Medicine

## 2017-07-31 ENCOUNTER — Ambulatory Visit (INDEPENDENT_AMBULATORY_CARE_PROVIDER_SITE_OTHER): Payer: Medicaid Other | Admitting: Family Medicine

## 2017-07-31 VITALS — BP 112/82 | HR 93 | Temp 98.2°F | Ht 69.0 in | Wt 297.0 lb

## 2017-07-31 DIAGNOSIS — R413 Other amnesia: Secondary | ICD-10-CM

## 2017-07-31 DIAGNOSIS — R110 Nausea: Secondary | ICD-10-CM

## 2017-07-31 MED ORDER — LURASIDONE HCL 20 MG PO TABS: 20.0000 mg | ORAL_TABLET | Freq: Every evening | ORAL | 2 refills | Status: DC

## 2017-07-31 MED ORDER — TRAZODONE HCL 50 MG PO TABS: 25.0000 mg | ORAL_TABLET | Freq: Every evening | ORAL | 3 refills | Status: DC | PRN

## 2017-07-31 NOTE — Patient Instructions (Addendum)
If you do not hear anything about your referrals in the next 1-2 weeks, call our office and ask for an update.  Contact your psychiatrist for an appointment and ask if your memory could be related to this.   Let us know if you need anything.

## 2017-07-31 NOTE — Progress Notes (Signed)
Chief Complaint  Patient presents with  . Follow-up    Pt here for f/u visit to discuss MRI and gastro referral.    Subjective: Patient is a 27 y.o. female here for memory loss.   Lab work up was neg. Noshowed for psych appt. Wants to know what is going on.  Pt continues to have issues w GI tract. Has freq defecation and nausea with every bowel movement. Nothing appears to make it better or worse. This has been going on for several mo now. Requesting to see specialist. No pain, bleeding or weight loss.  ROS: Psych: As noted in HPI GI: As noted in HPI  Past Medical History:  Diagnosis Date  . Diabetes mellitus type 1 (HCC)    Follows with Endo  . Tobacco abuse     Objective: BP 112/82 (BP Location: Right Arm, Patient Position: Sitting, Cuff Size: Large)   Pulse 93   Temp 98.2 F (36.8 C) (Oral)   Ht 5\' 9"  (1.753 m)   Wt 297 lb (134.7 kg)   SpO2 95%   BMI 43.86 kg/m  General: Awake, appears stated age HEENT: MMM, EOMi Heart: RRR, no murmurs Lungs: CTAB, no rales, wheezes or rhonchi. No accessory muscle use Psych: Age appropriate judgment and insight, normal affect and mood  Assessment and Plan: Poor memory - Plan: Ambulatory referral to Neuropsychology  Memory loss - Plan: Ambulatory referral to Neuropsychology, CT Head Wo Contrast  Nausea - Plan: Ambulatory referral to Gastroenterology  Orders as above. Ck head CT, refer to neuropsychology for eval. Encouraged to contact psychiatrist to see if they think it is related to anxiety. Refer to GI per pt request. F/u as originally scheduled. The patient voiced understanding and agreement to the plan.  Jilda Rocheicholas Paul SundanceWendling, DO 07/31/17  2:25 PM

## 2017-08-04 ENCOUNTER — Ambulatory Visit (INDEPENDENT_AMBULATORY_CARE_PROVIDER_SITE_OTHER): Payer: Medicaid Other | Admitting: Podiatry

## 2017-08-04 ENCOUNTER — Telehealth: Payer: Self-pay | Admitting: Podiatry

## 2017-08-04 DIAGNOSIS — M216X9 Other acquired deformities of unspecified foot: Secondary | ICD-10-CM

## 2017-08-04 DIAGNOSIS — Z9889 Other specified postprocedural states: Secondary | ICD-10-CM

## 2017-08-04 NOTE — Telephone Encounter (Signed)
I was calling to see if you could ask Dr. Ardelle AntonWagoner if the bottom of my foot where the stitches are, is supposes to be hurting as bad is it? The pain level is at a 6 or 7 when I walk. I just wanted to know if its still supposed to be like that, swollen and tinder? My number is [phone]. Thank you.

## 2017-08-04 NOTE — Telephone Encounter (Signed)
I told pt that she was just about 20 days post op and could expect some pain, some which may be related to increase of activity, and the scar stretching and swelling. I told pt to back off activity to her comfort zone, rest and ice for periods of discomfort, and call with concerns.

## 2017-08-05 ENCOUNTER — Telehealth: Payer: Self-pay | Admitting: Podiatry

## 2017-08-05 NOTE — Progress Notes (Signed)
Subjective: Molly BloomSusan Savage is a 27 y.o. is seen today in office s/p left 5th metatarsal head resection preformed on 07/16/2017. She presents today for suture removal. Her pain is improved. She gets some pain just below the incision site.  She is using a cam boot.  She is not taking pain medication at this time.  Denies any recent injury or falls overall she is doing well she has no new concerns. Denies any systemic complaints such as fevers, chills, nausea, vomiting. No calf pain, chest pain, shortness of breath.   Objective: General: No acute distress, AAOx3  DP/PT pulses palpable 2/4, CRT < 3 sec to all digits.  Protective sensation intact. Motor function intact.  LEFT foot: Incision is well coapted without any evidence of dehiscence with sutures intact.  There is no surrounding erythema, ascending cellulitis.  There is no fluctuation or crepitation.  There is no malodor.  Incision appears to be healing well with any signs of infection.  Trace edema.  Minimal discomfort in the surgical site. No other open lesions or pre-ulcerative lesions.  No pain with calf compression, swelling, warmth, erythema.   Assessment and Plan:  Status post left foot 5th metatarsal head resection, doing well with no complications   -Treatment options discussed including all alternatives, risks, and complications -Sutures removed today without complications and after removal incision remained well coapted.  Antibiotic ointment and a bandage was applied as well as Steri-Strips.  She can start to shower in 2 days along the incision remains healing well.  Continue in cam boot for now but over the next week as she started to feel better and asked the incisions healing she can start to transition to a regular shoe. -Ice/elevation -Pain medication as needed. -Monitor for any clinical signs or symptoms of infection and DVT/PE and directed to call the office immediately should any occur or go to the ER. -Follow-up as  scheduled or sooner if any problems arise. In the meantime, encouraged to call the office with any questions, concerns, change in symptoms.   Ovid CurdMatthew Doreene Forrey, DPM

## 2017-08-05 NOTE — Telephone Encounter (Signed)
I was calling to see if I could get Dr. Ardelle AntonWagoner to e-mail me a copy of the release form for me to go back to work? You can just give me a call back at [phone] so I can give you my e-mail id for my work. Thank you.

## 2017-08-05 NOTE — Telephone Encounter (Signed)
I just took the stitches out. 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 I think it may be too early to go back in a regular shoe and go to work.

## 2017-08-06 ENCOUNTER — Ambulatory Visit: Payer: Medicaid Other | Admitting: Gastroenterology

## 2017-08-06 ENCOUNTER — Encounter: Payer: Self-pay | Admitting: *Deleted

## 2017-08-06 NOTE — Telephone Encounter (Signed)
I informed pt of Dr. Gabriel RungWagoner's recommendations. Pt states she would like to return to work, but if she rushes it makes her foot hurt. I asked pt for what she was capable of performing and she stated parttime hours and light duty. I asked what parttime was to her employer. Pt states 4-5 hours and light duty, at her own pace. I told pt could write for the 4-5 hours/day and in the surgery shoe until reevaluated 08/14/2017. Note emailed to [email or web address], by Pasty ArchJ. Smith - Records Coordinator.

## 2017-08-06 NOTE — Telephone Encounter (Signed)
Pt's work Teacher, musicnote/letter was e-mailed to pt's new e-mail address of [email or web address]

## 2017-08-11 ENCOUNTER — Encounter: Payer: Self-pay | Admitting: Family Medicine

## 2017-08-11 ENCOUNTER — Ambulatory Visit: Payer: Medicaid Other | Admitting: Family Medicine

## 2017-08-11 VITALS — BP 116/80 | HR 98 | Temp 98.1°F | Ht 69.0 in | Wt 301.4 lb

## 2017-08-11 DIAGNOSIS — L920 Granuloma annulare: Secondary | ICD-10-CM

## 2017-08-11 MED ORDER — METHYLPREDNISOLONE ACETATE 40 MG/ML IJ SUSP
40.0000 mg | Freq: Once | INTRAMUSCULAR | Status: AC
  Administered 2017-08-11: 40 mg via INTRALESIONAL

## 2017-08-11 NOTE — Patient Instructions (Signed)
Things to look out for: increasing pain not relieved by ibuprofen/acetaminophen, fevers, spreading redness, drainage of pus, or foul odor.  You don't have to use the cream anymore.  Let us know if you need anything.

## 2017-08-11 NOTE — Addendum Note (Signed)
Addended by: Scharlene GlossEWING, Shamiracle Gorden B on: 08/11/2017 03:43 PM   Modules accepted: Orders

## 2017-08-11 NOTE — Progress Notes (Signed)
Chief Complaint  Patient presents with  . Follow-up    leg    Molly Savage is a 27 y.o. female here for a skin complaint.  Dx'd with GA. Potent topical steroids have not been helpful. She is here for possible injections. It is slightly itchy and tender over the past couple days. No new topicals.   ROS:  Const: No fevers Skin: As noted in HPI  Past Medical History:  Diagnosis Date  . Diabetes mellitus type 1 (HCC)    Follows with Endo  . Tobacco abuse    No Known Allergies   BP 116/80 (BP Location: Left Arm, Patient Position: Sitting, Cuff Size: Large)   Pulse 98   Temp 98.1 F (36.7 C) (Oral)   Ht 5\' 9"  (1.753 m)   Wt (!) 301 lb 6 oz (136.7 kg)   SpO2 96%   BMI 44.51 kg/m  Gen: awake, alert, appearing stated age Lungs: No accessory muscle use Skin: patch of erythema x2 on LLE still present. No ttp.. No drainage, fluctuance, excoriation Psych: Age appropriate judgment and insight  Procedure note: intralesional steroid injection Verbal consent obtained Area cleaned with alcohol x1 Freeze spray was used over 1 lesion 1.5 mL of mixture of 40mg  Depo with 2 mL of 1% lidocaine was fanned under lesion. This was repeated on the other lesion. The areas were bandaged.  Pt tolerated procedure well. No immediate complications noted.  Granuloma annulare  Intralesional injections performed. Aftercare warnings verbalized and written down. F/u in 3 weeks or prn. The patient voiced understanding and agreement to the plan.  Jilda Rocheicholas Paul GurdonWendling, DO 08/11/17 3:32 PM

## 2017-08-11 NOTE — Progress Notes (Signed)
Pre visit review using our clinic review tool, if applicable. No additional management support is needed unless otherwise documented below in the visit note. 

## 2017-08-14 ENCOUNTER — Ambulatory Visit (INDEPENDENT_AMBULATORY_CARE_PROVIDER_SITE_OTHER): Payer: Medicaid Other | Admitting: Podiatry

## 2017-08-14 ENCOUNTER — Ambulatory Visit: Payer: Medicaid Other

## 2017-08-14 ENCOUNTER — Encounter: Payer: Self-pay | Admitting: Podiatry

## 2017-08-14 DIAGNOSIS — Z9889 Other specified postprocedural states: Secondary | ICD-10-CM

## 2017-08-14 DIAGNOSIS — M216X9 Other acquired deformities of unspecified foot: Secondary | ICD-10-CM

## 2017-08-15 NOTE — Progress Notes (Signed)
Subjective: Molly BloomSusan Savage is a 27 y.o. is seen today in office s/p left 5th metatarsal head resection preformed on 07/16/2017. She states that she is doing better but still having some generalized foot pain.  Because of this she is remained inside the surgical shoe.  She denies any recent injury or trauma she denies any increase in swelling or redness or any drainage coming from the incision or any open sores.  She has no other concerns today. Denies any systemic complaints such as fevers, chills, nausea, vomiting. No calf pain, chest pain, shortness of breath.   Objective: General: No acute distress, AAOx3  DP/PT pulses palpable 2/4, CRT < 3 sec to all digits.  Protective sensation intact. Motor function intact.  LEFT foot: Incision is well coapted without any evidence of dehiscence and a scar has formed. There is no surrounding erythema, ascending cellulitis.  There is no fluctuation or crepitation.  There is no malodor.  There is no clinical signs of infection.  There is mild tenderness palpation of the surgical site but in general there is some diffuse tenderness to the foot but no specific area of tenderness otherwise. No other open lesions or pre-ulcerative lesions.  No pain with calf compression, swelling, warmth, erythema.   Assessment and Plan:  Status post left foot 5th metatarsal head resection, doing well with no complications   -Treatment options discussed including all alternatives, risks, and complications -Incision is healing well.  She can start to transition to regular shoe as she is able to.  We got to get her into an accommodative insert on both feet to help offload the areas.  In the short-term I did dispense tube foam padding on the surgical site on the left side but she could also use this on the right side as well.  Gradually increase activity level.  Continue ice elevate. -Monitor for any clinical signs or symptoms of infection and directed to call the office immediately  should any occur or go to the ER.  Return in about 3 weeks (around 09/04/2017).  Vivi BarrackMatthew R Wateen Varon DPM

## 2017-08-26 ENCOUNTER — Telehealth: Payer: Self-pay | Admitting: Podiatry

## 2017-08-26 NOTE — Telephone Encounter (Signed)
Dr. Gabriel RungWagoner's nurse [facility] [place] [facility] for the shoes. They said they would call me when they got them. Can you just have her call me at [phone] and see if they got them. Thank you.

## 2017-08-27 ENCOUNTER — Telehealth: Payer: Self-pay | Admitting: Podiatry

## 2017-08-27 NOTE — Telephone Encounter (Signed)
I was calling to let the nurse know that I'm on the way to get the inserts for my shoes. I should be there within the hour. I appreciate everything you all are doing to help me out. Thank you.

## 2017-08-27 NOTE — Telephone Encounter (Signed)
Patient came by today and got the inserts and I explained how to wear them. Misty StanleyLisa

## 2017-09-01 ENCOUNTER — Ambulatory Visit: Payer: Medicaid Other | Admitting: Family Medicine

## 2017-09-01 DIAGNOSIS — Z0289 Encounter for other administrative examinations: Secondary | ICD-10-CM

## 2017-09-08 ENCOUNTER — Encounter: Payer: Self-pay | Admitting: Family Medicine

## 2017-09-12 ENCOUNTER — Encounter: Payer: Medicaid Other | Admitting: Podiatry

## 2017-09-13 DIAGNOSIS — R7989 Other specified abnormal findings of blood chemistry: Secondary | ICD-10-CM | POA: Insufficient documentation

## 2017-09-18 ENCOUNTER — Inpatient Hospital Stay: Payer: Medicaid Other | Admitting: Family Medicine

## 2017-10-07 ENCOUNTER — Ambulatory Visit: Payer: Medicaid Other | Admitting: Gastroenterology

## 2017-10-17 ENCOUNTER — Encounter: Payer: Self-pay | Admitting: Family Medicine

## 2017-10-17 ENCOUNTER — Ambulatory Visit: Payer: Medicaid Other | Admitting: Family Medicine

## 2017-10-17 VITALS — BP 128/88 | HR 91 | Temp 97.8°F | Resp 18 | Ht 69.0 in | Wt 289.0 lb

## 2017-10-17 DIAGNOSIS — N946 Dysmenorrhea, unspecified: Secondary | ICD-10-CM

## 2017-10-17 DIAGNOSIS — L920 Granuloma annulare: Secondary | ICD-10-CM | POA: Diagnosis not present

## 2017-10-17 MED ORDER — NAPROXEN 500 MG PO TABS: 500.0000 mg | ORAL_TABLET | Freq: Two times a day (BID) | ORAL | 0 refills | Status: DC

## 2017-10-17 NOTE — Patient Instructions (Signed)
Don't take Midol while on Naproxen.  Heat (pad or rice pillow in microwave) over affected area, 10-15 minutes twice daily.   Let us know if you need anything.

## 2017-10-17 NOTE — Progress Notes (Signed)
Chief Complaint  Patient presents with  . Injections    Follow up  . Blood Pressure Check  . Diabetes    DKA, recent hospitalization    Subjective: Patient is a 27 y.o. female here for f/u GA.  Had inj in July but missed f/u 2/2 hospitalization for DKA. No pain, just bothersome. Interested in inj today.  Hx of painful menses. Uses Midol but does not help. Has not used naproxen before.   ROS: GU: +pelvic cramping  Past Medical History:  Diagnosis Date  . Diabetes mellitus type 1 (HCC)    Follows with Endo  . Tobacco abuse     Objective: BP 128/88 (BP Location: Left Arm, Patient Position: Sitting, Cuff Size: Large)   Pulse 91   Temp 97.8 F (36.6 C) (Oral)   Resp 18   Ht 5\' 9"  (1.753 m)   Wt 289 lb (131.1 kg)   SpO2 98%   BMI 42.68 kg/m  General: Awake, appears stated age Skin: erythematous patches still present on L anterior shin. No fluctuance, ttp, or scaling. Lungs: CTAB, no rales, wheezes or rhonchi. No accessory muscle use Psych: Age appropriate judgment and insight, normal affect and mood  Procedure note: intralesional steroid injection Verbal consent obtained Area cleaned with alcohol x1 Freeze spray was used over 1 lesion 1.5 mL of mixture of 40mg  Depo with 2 mL of 1% lidocaine was fanned under lesion w 27 g needle. This was repeated on the other lesion. The areas were bandaged.  Pt tolerated procedure well. No immediate complications noted.  Assessment and Plan: Granuloma annulare  Menses painful - Plan: naproxen (NAPROSYN) 500 MG tablet  Inj today. Will repeat in 3 weeks and in another 3 weeks after. If no improvement, will offer referral to derm or just monitoring. Above for #2 during cycle. F/u in 3 weeks. The patient voiced understanding and agreement to the plan.  Jilda Roche [place], DO 10/17/17  11:23 AM

## 2017-10-24 ENCOUNTER — Telehealth: Payer: Self-pay | Admitting: Family Medicine

## 2017-10-24 MED ORDER — FAMOTIDINE 20 MG PO TABS: 20.0000 mg | ORAL_TABLET | Freq: Two times a day (BID) | ORAL | 3 refills | Status: DC

## 2017-10-24 NOTE — Telephone Encounter (Signed)
Sent in and patient informed 

## 2017-10-24 NOTE — Addendum Note (Signed)
Addended by: Scharlene Gloss B on: 10/24/2017 03:15 PM   Modules accepted: Orders

## 2017-10-24 NOTE — Telephone Encounter (Signed)
Pepcid is fine, 20 mg twice daily. Ranitidine is the reflux medicine that I am aware of that was recalled. TY.

## 2017-10-24 NOTE — Telephone Encounter (Signed)
Copied from CRM [id number]. Topic: Quick Communication - See Telephone Encounter >> Oct 24, 2017 12:40 PM Lorrine Kin, NT wrote: CRM for notification. See Telephone encounter for: 10/24/17. Patient calling and states that her mother told her that her doctor told her that the omeprazole (PRILOSEC) 20 MG capsule  has been recalled. Patient requesting prescription for pepcid ac be sent to [facility] - [place], [place] - [address] CB#: [phone]

## 2017-11-07 ENCOUNTER — Ambulatory Visit: Payer: Medicaid Other | Admitting: Family Medicine

## 2017-11-12 ENCOUNTER — Ambulatory Visit: Payer: Medicaid Other | Admitting: Family Medicine

## 2017-11-12 ENCOUNTER — Encounter: Payer: Self-pay | Admitting: Family Medicine

## 2017-11-12 VITALS — BP 112/80 | HR 92 | Temp 98.4°F | Ht 69.0 in | Wt 291.5 lb

## 2017-11-12 DIAGNOSIS — Z23 Encounter for immunization: Secondary | ICD-10-CM

## 2017-11-12 DIAGNOSIS — T7840XA Allergy, unspecified, initial encounter: Secondary | ICD-10-CM | POA: Diagnosis not present

## 2017-11-12 DIAGNOSIS — M79672 Pain in left foot: Secondary | ICD-10-CM

## 2017-11-12 DIAGNOSIS — L920 Granuloma annulare: Secondary | ICD-10-CM

## 2017-11-12 MED ORDER — METHYLPREDNISOLONE ACETATE 40 MG/ML IJ SUSP
40.0000 mg | Freq: Once | INTRAMUSCULAR | Status: AC
  Administered 2017-11-12: 40 mg via INTRALESIONAL

## 2017-11-12 MED ORDER — IBUPROFEN 800 MG PO TABS: 800.0000 mg | ORAL_TABLET | Freq: Three times a day (TID) | ORAL | 0 refills | Status: DC | PRN

## 2017-11-12 MED ORDER — LEVOCETIRIZINE DIHYDROCHLORIDE 5 MG PO TABS: 5.0000 mg | ORAL_TABLET | Freq: Every evening | ORAL | 2 refills | Status: DC

## 2017-11-12 MED ORDER — ACETAMINOPHEN 500 MG PO TABS: 500.0000 mg | ORAL_TABLET | Freq: Four times a day (QID) | ORAL | 0 refills | Status: DC | PRN

## 2017-11-12 NOTE — Progress Notes (Signed)
Chief Complaint  Patient presents with  . Follow-up    legs    Subjective: Patient is a 27 y.o. female here for skin f/u.  Hx of GA, skin starting to get slightly better with steroid inj. Here for cycle 2.  Hx of foot surg, got worse after stepping wrong. 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   Hx of allergies, would like something non drowsy. No fevers, sob, wheezing or other uri complaints.   ROS: Heart: Denies chest pain  Lungs: Denies SOB   Past Medical History:  Diagnosis Date  . Diabetes mellitus type 1 (HCC)    Follows with Endo  . Tobacco abuse     Objective: BP 112/80 (BP Location: Left Arm, Patient Position: Sitting, Cuff Size: Large)   Pulse 92   Temp 98.4 F (36.9 C) (Oral)   Ht 5\' 9"  (1.753 m)   Wt 291 lb 8 oz (132.2 kg)   SpO2 96%   BMI 43.05 kg/m  General: Awake, appears stated age Heart: RRR, no murmurs Lungs: CTAB, no rales, wheezes or rhonchi. No accessory muscle use Skin: See below Psych: Age appropriate judgment and insight, normal affect and mood   LLE  Procedure note: skin lesion injection Verbal consent obtained Area cleaned with alcohol x1 Freeze spray used for anesthesia 27 g needle used to inject 40 mg depo and 2 cc 1% lido w/o epi Bandage placed.  This was repeated on the lower lesion. No complications noted. Pt tolerated well  Assessment and Plan: Granuloma annulare  Left foot pain - Plan: ibuprofen (ADVIL,MOTRIN) 800 MG tablet, acetaminophen (TYLENOL) 500 MG tablet  Allergic state, initial encounter - Plan: levocetirizine (XYZAL) 5 MG tablet  Need for influenza vaccination - Plan: Flu Vaccine QUAD 6+ mos PF IM (Fluarix Quad PF)  Inj today. Final cycle in 3 weeks. Above. Ice Xyzal. F/u 3 weeks. The patient voiced understanding and agreement to the plan.  Jilda Roche [place], DO 11/12/17  3:30 PM

## 2017-11-12 NOTE — Progress Notes (Signed)
Pre visit review using our clinic review tool, if applicable. No additional management support is needed unless otherwise documented below in the visit note. 

## 2017-11-12 NOTE — Patient Instructions (Signed)
OK to take Tylenol 1000 mg (2 extra strength tabs) or 975 mg (3 regular strength tabs) every 6 hours as needed. ? ?Ice/cold pack over area for 10-15 min twice daily. ? ?Let us know if you need anything. ?

## 2017-11-21 ENCOUNTER — Other Ambulatory Visit (INDEPENDENT_AMBULATORY_CARE_PROVIDER_SITE_OTHER): Payer: Medicaid Other

## 2017-11-21 ENCOUNTER — Ambulatory Visit (INDEPENDENT_AMBULATORY_CARE_PROVIDER_SITE_OTHER): Payer: Medicaid Other

## 2017-11-21 ENCOUNTER — Encounter

## 2017-11-21 ENCOUNTER — Ambulatory Visit: Payer: Medicaid Other | Admitting: Podiatry

## 2017-11-21 ENCOUNTER — Ambulatory Visit: Payer: Medicaid Other | Admitting: Gastroenterology

## 2017-11-21 ENCOUNTER — Encounter: Payer: Self-pay | Admitting: Gastroenterology

## 2017-11-21 VITALS — BP 112/68 | HR 76 | Ht 68.5 in | Wt 288.0 lb

## 2017-11-21 DIAGNOSIS — R110 Nausea: Secondary | ICD-10-CM | POA: Diagnosis not present

## 2017-11-21 DIAGNOSIS — M7752 Other enthesopathy of left foot: Secondary | ICD-10-CM | POA: Diagnosis not present

## 2017-11-21 DIAGNOSIS — E1040 Type 1 diabetes mellitus with diabetic neuropathy, unspecified: Secondary | ICD-10-CM

## 2017-11-21 DIAGNOSIS — M216X9 Other acquired deformities of unspecified foot: Secondary | ICD-10-CM

## 2017-11-21 DIAGNOSIS — M779 Enthesopathy, unspecified: Principal | ICD-10-CM

## 2017-11-21 DIAGNOSIS — M778 Other enthesopathies, not elsewhere classified: Secondary | ICD-10-CM

## 2017-11-21 DIAGNOSIS — K5900 Constipation, unspecified: Secondary | ICD-10-CM | POA: Diagnosis not present

## 2017-11-21 DIAGNOSIS — Z872 Personal history of diseases of the skin and subcutaneous tissue: Secondary | ICD-10-CM

## 2017-11-21 LAB — CBC WITH DIFFERENTIAL/PLATELET
Basophils Absolute: 0.1 10*3/uL (ref 0.0–0.1)
Basophils Relative: 0.7 % (ref 0.0–3.0)
Eosinophils Absolute: 0.1 10*3/uL (ref 0.0–0.7)
Eosinophils Relative: 0.6 % (ref 0.0–5.0)
HCT: 38.2 % (ref 36.0–46.0)
Hemoglobin: 12.6 g/dL (ref 12.0–15.0)
Lymphocytes Relative: 23.3 % (ref 12.0–46.0)
Lymphs Abs: 2.5 10*3/uL (ref 0.7–4.0)
MCHC: 33 g/dL (ref 30.0–36.0)
MCV: 86.2 fl (ref 78.0–100.0)
Monocytes Absolute: 0.6 10*3/uL (ref 0.1–1.0)
Monocytes Relative: 5.9 % (ref 3.0–12.0)
Neutro Abs: 7.6 10*3/uL (ref 1.4–7.7)
Neutrophils Relative %: 69.5 % (ref 43.0–77.0)
Platelets: 259 10*3/uL (ref 150.0–400.0)
RBC: 4.44 Mil/uL (ref 3.87–5.11)
RDW: 15.4 % (ref 11.5–15.5)
WBC: 10.9 10*3/uL — ABNORMAL HIGH (ref 4.0–10.5)

## 2017-11-21 LAB — COMPREHENSIVE METABOLIC PANEL
ALT: 9 U/L (ref 0–35)
AST: 9 U/L (ref 0–37)
Albumin: 3.9 g/dL (ref 3.5–5.2)
Alkaline Phosphatase: 87 U/L (ref 39–117)
BUN: 12 mg/dL (ref 6–23)
CO2: 25 mEq/L (ref 19–32)
Calcium: 8.9 mg/dL (ref 8.4–10.5)
Chloride: 109 mEq/L (ref 96–112)
Creatinine, Ser: 0.61 mg/dL (ref 0.40–1.20)
GFR: 123.13 mL/min (ref >60.00)
Glucose, Bld: 150 mg/dL — ABNORMAL HIGH (ref 70–99)
Potassium: 4 mEq/L (ref 3.5–5.1)
Sodium: 140 mEq/L (ref 135–145)
Total Bilirubin: 0.4 mg/dL (ref 0.2–1.2)
Total Protein: 6.7 g/dL (ref 6.0–8.3)

## 2017-11-21 NOTE — Patient Instructions (Addendum)
Please start taking citrucel (orange flavored) powder fiber supplement.  This may cause some bloating at first but that usually goes away. Begin with a small spoonful and work your way up to a large, heaping spoonful daily over a week.  (will look into prescription for this).  Stay hyrdrated.  Cut back on caffeine.  pepcid twice daily (before bedtime and then 12 hours later)  Please return to see Dr. Christella Hartigan in 2 months.  You will have labs checked today in the basement lab.  Please head down after you check out with the front desk  (cbc, cmet).  Your provider has requested that you go to the basement level for lab work before leaving today. Press "B" on the elevator. The lab is located at the first door on the left as you exit the elevator.  Thank you for entrusting me with your care and choosing [facility].  Dr Christella Hartigan

## 2017-11-21 NOTE — Progress Notes (Signed)
HPI: This is a very pleasant 27 year old woman who was referred to me by Shelda Pal*  to evaluate constipation, nausea, GERD.    Chief complaint is chronic constipation, chronic GERD, nausea  Constipation: once a week or two.  SHe's tried miralax; cannot recall how she took.  Never tried fiber supplements.  Has to push/strain at times.  Never seen blood in her stool.  Has GERD, pyrosis symptoms, really started after birth of her son.  Takes pepcid twice daily (works well).  Used to take prilosec, usually once daily.  Never dysphagia.  Overall weight up and down.    She has chronic nausea as well.  Seems to be worse in the morning.  Old Data Reviewed: Labs 06/2017: cbc normal, cmet normal except gluc 192    Review of systems: Pertinent positive and negative review of systems were noted in the above HPI section. All other review negative.   Past Medical History:  Diagnosis Date  . Diabetes mellitus type 1 ([place])    Follows with Endo  . Tobacco abuse   . Wears glasses   morbid obesity BMI 43  Past Surgical History:  Procedure Laterality Date  . CESAREAN SECTION N/A 02/01/2016   Procedure: CESAREAN SECTION;  Surgeon: Mora Bellman, MD;  Location: [facility];  Service: Obstetrics;  Laterality: N/A;  . FOOT SURGERY Left     Current Outpatient Medications  Medication Sig Dispense Refill  . acetaminophen (TYLENOL) 500 MG tablet Take 1 tablet (500 mg total) by mouth every 6 (six) hours as needed. 30 tablet 0  . ibuprofen (ADVIL,MOTRIN) 800 MG tablet Take 1 tablet (800 mg total) by mouth every 8 (eight) hours as needed. 30 tablet 0  . insulin aspart (NOVOLOG) 100 UNIT/ML injection Use as directed in insulin pump. MDD = 125 units. DX. E10.65    . Insulin Infusion Pump (MINIMED 630G INSULIN PUMP) KIT by Does not apply route.    Marland Kitchen levocetirizine (XYZAL) 5 MG tablet Take 1 tablet (5 mg total) by mouth every evening. 30 tablet 2  . oxyCODONE (OXY IR/ROXICODONE) 5  MG immediate release tablet Take 1 tablet (5 mg total) by mouth every 4 (four) hours as needed for severe pain. 20 tablet 0   No current facility-administered medications for this visit.     Allergies as of 11/21/2017  . (No Known Allergies)    Family History  Problem Relation Age of Onset  . Hypertension Mother   . Bipolar disorder Sister   . Heart disease Maternal Uncle   . ADD / ADHD Brother     Social History   Socioeconomic History  . Marital status: Significant Other    Spouse name: Not on file  . Number of children: Not on file  . Years of education: Not on file  . Highest education level: Not on file  Occupational History  . Not on file  Social Needs  . Financial resource strain: Not on file  . Food insecurity:    Worry: Not on file    Inability: Not on file  . Transportation needs:    Medical: Not on file    Non-medical: Not on file  Tobacco Use  . Smoking status: Current Every Day Smoker    Packs/day: 0.50    Years: 12.00    Pack years: 6.00    Types: Cigarettes  . Smokeless tobacco: Never Used  Substance and Sexual Activity  . Alcohol use: No  . Drug use: No  . Sexual  activity: Not on file  Lifestyle  . Physical activity:    Days per week: Not on file    Minutes per session: Not on file  . Stress: Not on file  Relationships  . Social connections:    Talks on phone: Not on file    Gets together: Not on file    Attends religious service: Not on file    Active member of club or organization: Not on file    Attends meetings of clubs or organizations: Not on file    Relationship status: Not on file  . Intimate partner violence:    Fear of current or ex partner: Not on file    Emotionally abused: Not on file    Physically abused: Not on file    Forced sexual activity: Not on file  Other Topics Concern  . Not on file  Social History Narrative  . Not on file     Physical Exam: BP 112/68   Pulse 76   Ht 5' 8.5" (1.74 m)   Wt 288 lb (130.6  kg)   BMI 43.15 kg/m  Constitutional: generally well-appearing Psychiatric: alert and oriented x3 Eyes: extraocular movements intact Mouth: oral pharynx moist, no lesions Neck: supple no lymphadenopathy Cardiovascular: heart regular rate and rhythm Lungs: clear to auscultation bilaterally Abdomen: soft, nontender, nondistended, no obvious ascites, no peritoneal signs, normal bowel sounds Extremities: no lower extremity edema bilaterally Skin: no lesions on visible extremities   Assessment and plan: 27 y.o. female with morbid obesity, chronic constipation, chronic GERD and GERD related nausea I suspect  First her GERD symptoms are well controlled on H2 blocker taken twice a day.  She will continue this.  I recommended she take 1 shortly before she goes to bed and another about 12 hours later.  She is morbidly obese and losing weight will likely help her GERD control.  She drinks a 2 L diet Dr. Malachi Bonds about every couple days and I explained to her that caffeine can make GERD worse as well and she should try to cut back.  She has chronic constipation, will move her bowels 1 time every 7 to 14 days.  She has never tried fiber supplements.  She does not really tend to stay hydrated.  She is going to try fiber supplements now and she will try to stay hydrated better.  She will return to see me in 2 months and sooner if any issues.  She will have a basic set of lab tests today including a CBC and complete med about profile.    Please see the "Patient Instructions" section for addition details about the plan.   Owens Loffler, MD [facility] 11/21/2017, 3:11 PM  Cc: Shelda Pal*

## 2017-11-23 NOTE — Progress Notes (Signed)
Subjective: 27 year old female presents the office today for concerns of pain to her left foot.  She states she was doing great after the surgery up until last couple weeks when she states that she started to have a sharp pain to the onset aspect of the foot.  She denies any recent injury or trauma denies any increase in swelling or any redness.  No open sores.  She has no other concerns.  She is wearing insert inside of her shoe. Denies any systemic complaints such as fevers, chills, nausea, vomiting. No acute changes since last appointment, and no other complaints at this time.   Objective: AAO x3, NAD DP/PT pulses palpable bilaterally, CRT less than 3 seconds Incision from the prior surgery is well-healed.  There is minimal hyperkeratotic tissue submetatarsal 5 and this is not causing any pain.  There is no mild tenderness to palpation along the distal portion of the fifth metatarsal distally but there is no specific area pinpoint tenderness.  There is no other areas of tenderness identified at this time. No open lesions or pre-ulcerative lesions.  No pain with calf compression, swelling, warmth, erythema  Assessment: Left lateral foot pain  Plan: -All treatment options discussed with the patient including all alternatives, risks, complications.  -X-rays were obtained and reviewed.  There is no evidence of acute fracture or stress fracture identified today.  Status post fifth metatarsal head excision. -She is going to insert inside of her she was I think causing too much arch support and pushing her to the outside aspect.  I do think she needs a diabetic inserts to help offload the area prescription for [facility] was provided.  We also discussed shoe modifications. -Patient encouraged to call the office with any questions, concerns, change in symptoms.   Vivi Barrack DPM

## 2017-12-04 ENCOUNTER — Ambulatory Visit: Payer: Medicaid Other | Admitting: Family Medicine

## 2017-12-04 ENCOUNTER — Encounter: Payer: Self-pay | Admitting: Family Medicine

## 2017-12-04 VITALS — BP 112/78 | HR 85 | Temp 98.3°F | Ht 69.0 in | Wt 290.0 lb

## 2017-12-04 DIAGNOSIS — E1065 Type 1 diabetes mellitus with hyperglycemia: Secondary | ICD-10-CM | POA: Diagnosis not present

## 2017-12-04 DIAGNOSIS — M79672 Pain in left foot: Secondary | ICD-10-CM | POA: Diagnosis not present

## 2017-12-04 DIAGNOSIS — L920 Granuloma annulare: Secondary | ICD-10-CM | POA: Diagnosis not present

## 2017-12-04 MED ORDER — IBUPROFEN 800 MG PO TABS: 800.0000 mg | ORAL_TABLET | Freq: Three times a day (TID) | ORAL | 0 refills | Status: DC | PRN

## 2017-12-04 NOTE — Patient Instructions (Addendum)
Let me know if there is a form you need us to fill out.  If you want to see a dermatologist, let me know.  Let us know if you need anything.

## 2017-12-04 NOTE — Progress Notes (Signed)
Pre visit review using our clinic review tool, if applicable. No additional management support is needed unless otherwise documented below in the visit note. 

## 2017-12-04 NOTE — Progress Notes (Signed)
Chief Complaint  Patient presents with  . Follow-up    Subjective: Patient is a 27 y.o. female here for F/u GA.  Has been getting injections into GA (dx'd by biopsy) after failing topical steroid therapy. Has received several inj's w/o noticeable improvement. Not interested in dermatology referral at this time.  Hx of DM I, sees endo. Has callous formation on both feet and has a hx of foot infection. Needs a letter from her PCP.   ROS: Skin: As noted in HPI  Past Medical History:  Diagnosis Date  . Diabetes mellitus type 1 ([facility])    Follows with Endo  . Tobacco abuse   . Wears glasses     Objective: BP 112/78 (BP Location: Left Arm, Patient Position: Sitting, Cuff Size: Large)   Pulse 85   Temp 98.3 F (36.8 C) (Oral)   Ht 5\' 9"  (1.753 m)   Wt 290 lb (131.5 kg)   SpO2 98%   BMI 42.83 kg/m  General: Awake, appears stated age Skin: +callous on the lateral aspect of b/l feet, GA areas still present without improvement Lungs: No accessory muscle use Psych: Age appropriate judgment and insight, normal affect and mood  Assessment and Plan: Type 1 diabetes mellitus with hyperglycemia ([facility])  Granuloma annulare  Left foot pain - Plan: ibuprofen (ADVIL,MOTRIN) 800 MG tablet  Letter for insoles and DM shoes given. Apparently she was told that Endo is not allowed to write this letter? She certainly meets criteria though. No more injections, offered to refer to derm, but she would prefer to hold off for now. F/u for CPE as originally scheduled. The patient voiced understanding and agreement to the plan.  Jilda Rocheicholas Paul [place], DO 12/04/17  4:02 PM

## 2017-12-17 ENCOUNTER — Ambulatory Visit: Payer: Medicaid Other | Admitting: Family Medicine

## 2018-01-16 ENCOUNTER — Other Ambulatory Visit: Payer: Self-pay | Admitting: Family Medicine

## 2018-01-16 DIAGNOSIS — M79672 Pain in left foot: Secondary | ICD-10-CM

## 2018-01-16 MED ORDER — IBUPROFEN 800 MG PO TABS: 800.0000 mg | ORAL_TABLET | Freq: Three times a day (TID) | ORAL | 2 refills | Status: DC | PRN

## 2018-02-01 ENCOUNTER — Encounter: Payer: Self-pay | Admitting: Family Medicine

## 2018-02-03 ENCOUNTER — Telehealth: Payer: Self-pay | Admitting: *Deleted

## 2018-02-03 NOTE — Telephone Encounter (Signed)
Received Physician Orders from [facility] for Diabetic Shoes and Inserts; forwarded to provider/SLS 01/14

## 2018-02-06 ENCOUNTER — Telehealth: Payer: Self-pay | Admitting: *Deleted

## 2018-02-06 NOTE — Telephone Encounter (Signed)
Received Physician Orders from [facility] for Diabetic Shoes & Inserts, forwarded to provider/SLS

## 2018-02-10 ENCOUNTER — Telehealth: Payer: Self-pay | Admitting: *Deleted

## 2018-02-10 NOTE — Telephone Encounter (Signed)
They now will "need the F2F/Diabetic Management Notes in order to move forward with  Her treatment and stay compliant with her Insurance."; this is to receive her diabetic shoes and inserts that we have supplied the CMN and Letter of Medical Necessity for pt; forwarded to provider with last OV notes/SLS 01/21

## 2018-02-25 ENCOUNTER — Encounter: Payer: Self-pay | Admitting: Family Medicine

## 2018-03-20 ENCOUNTER — Encounter: Payer: Self-pay | Admitting: Certified Nurse Midwife

## 2018-03-20 ENCOUNTER — Ambulatory Visit (INDEPENDENT_AMBULATORY_CARE_PROVIDER_SITE_OTHER): Payer: Medicaid Other | Admitting: Certified Nurse Midwife

## 2018-03-20 VITALS — BP 159/79 | HR 85 | Ht 68.5 in | Wt 289.4 lb

## 2018-03-20 DIAGNOSIS — Z30432 Encounter for removal of intrauterine contraceptive device: Secondary | ICD-10-CM | POA: Diagnosis not present

## 2018-03-20 NOTE — Progress Notes (Signed)
    GYNECOLOGY CLINIC PROCEDURE NOTE  Ms. Molly Savage is a 28 y.o. G1P1001 here for [place] IUD removal. No GYN concerns.  Last pap smear was on 06/2015 and was normal.  IUD Removal  Patient was in the dorsal lithotomy position, normal external genitalia was noted.  A speculum was placed in the patient's vagina, patient currently on menstrual cycle, no lesions. The multiparous cervix was visualized, no lesions. The strings of the IUD were grasped and pulled using ring forceps. The IUD was removed in its entirety.  Patient tolerated the procedure well.    Patient plans for pregnancy soon and she was told to avoid teratogens, take PNV and folic acid.  Routine preventative health maintenance measures emphasized. Educated and discussed need for good management of diabetes prior to pregnancy, patient verbalizes understanding  Molly Savage, [place] 03/20/2018 12:00 PM

## 2018-03-23 ENCOUNTER — Telehealth: Payer: Self-pay | Admitting: *Deleted

## 2018-03-23 NOTE — Telephone Encounter (Signed)
Molly Savage called and left a message she was in our office a week ago and got IUD removed.Wants to know how long before she can get pregnant. States we can leave a message if we don't reach her.

## 2018-03-24 NOTE — Telephone Encounter (Signed)
Called patient to inform her once the IUD is removed she can get pregnant at any time.

## 2018-04-09 ENCOUNTER — Telehealth: Payer: Self-pay | Admitting: Family Medicine

## 2018-04-09 MED ORDER — OMEPRAZOLE 20 MG PO CPDR: DELAYED_RELEASE_CAPSULE | ORAL | 1 refills | Status: DC

## 2018-04-09 NOTE — Telephone Encounter (Signed)
That's fine to reorder previous rx. TY.

## 2018-04-09 NOTE — Telephone Encounter (Signed)
Please advise 

## 2018-04-09 NOTE — Telephone Encounter (Signed)
Copied from CRM [id number]. Topic: Quick Communication - See Telephone Encounter >> Apr 09, 2018  1:48 PM Trula Slade wrote: CRM for notification. See Telephone encounter for: 04/09/18. Patient stated that the Pepcid is not working for her, so she would like to go back on the Prilosec.  Can a prescription be sent to her preferred pharmacy [facility] on [address].

## 2018-04-09 NOTE — Telephone Encounter (Signed)
Refill done.  

## 2018-04-17 ENCOUNTER — Encounter: Payer: Self-pay | Admitting: *Deleted

## 2018-04-24 ENCOUNTER — Telehealth: Payer: Self-pay

## 2018-04-24 NOTE — Telephone Encounter (Signed)
She cannot take tylenol due to her g6 sensor

## 2018-04-24 NOTE — Telephone Encounter (Signed)
Copied from CRM [id number]. Topic: General - Inquiry >> Apr 24, 2018 11:44 AM Crist Infante wrote: Reason for CRM: pt wants to know if there is anything besides tylenol (which she cannot take b/c of her dexcom g6 sensor) or any of the pain meds associted with the covid 19, that she can take for pain.  Pt states her back, legs and feet hurt.  Pt states she could also be pregnant.

## 2018-04-24 NOTE — Telephone Encounter (Signed)
Could use heat, ice, Biofreeze. I don't have good options for her.

## 2018-04-24 NOTE — Telephone Encounter (Signed)
Not if she is pregnant. OK to take Tylenol 1000 mg (2 extra strength tabs) or 975 mg (3 regular strength tabs) every 6 hours as needed.

## 2018-04-27 NOTE — Telephone Encounter (Signed)
Called left detailed message of PCP instructions. 

## 2018-05-11 ENCOUNTER — Telehealth: Payer: Self-pay | Admitting: *Deleted

## 2018-05-11 ENCOUNTER — Telehealth: Payer: Self-pay | Admitting: Family Medicine

## 2018-05-11 NOTE — Telephone Encounter (Signed)
Returned pt call regarding her missed period following IUD removal. Pt did not pick up.  Left message informing pt that she was being contacted regarding her missed call and that, if she continued to have questions or concerns, she could contact the office.  Mychart message also sent.

## 2018-05-11 NOTE — Telephone Encounter (Signed)
Pt called because she wants to know if the removal of her IUD could have caused her to miss her first period after it's removal.

## 2018-05-14 ENCOUNTER — Encounter: Payer: Self-pay | Admitting: Medical

## 2018-05-14 ENCOUNTER — Ambulatory Visit (INDEPENDENT_AMBULATORY_CARE_PROVIDER_SITE_OTHER): Payer: Self-pay | Admitting: Medical

## 2018-05-14 ENCOUNTER — Other Ambulatory Visit: Payer: Self-pay

## 2018-05-14 ENCOUNTER — Encounter: Payer: Self-pay | Admitting: Family Medicine

## 2018-05-14 DIAGNOSIS — R51 Headache: Secondary | ICD-10-CM

## 2018-05-14 DIAGNOSIS — M545 Low back pain, unspecified: Secondary | ICD-10-CM

## 2018-05-14 DIAGNOSIS — N926 Irregular menstruation, unspecified: Secondary | ICD-10-CM

## 2018-05-14 DIAGNOSIS — R350 Frequency of micturition: Secondary | ICD-10-CM

## 2018-05-14 DIAGNOSIS — R519 Headache, unspecified: Secondary | ICD-10-CM

## 2018-05-14 DIAGNOSIS — R110 Nausea: Secondary | ICD-10-CM

## 2018-05-14 LAB — POC URINALSYSI DIPSTICK (AUTOMATED)
Bilirubin, UA: NEGATIVE
Glucose, UA: NEGATIVE
Ketones, UA: NEGATIVE
Leukocytes, UA: NEGATIVE
Nitrite, UA: NEGATIVE
Protein, UA: NEGATIVE
Spec Grav, UA: 1.015 (ref 1.010–1.025)
Urobilinogen, UA: 0.2 E.U./dL
pH, UA: 6 (ref 5.0–8.0)

## 2018-05-14 NOTE — Progress Notes (Signed)
Subjective:    Patient ID: Molly Savage, female    DOB: 07-Sep-1990, 28 y.o.   MRN: [id number]  HPI  Virtual Visit via Video Note  I connected with Bedie Bohmer on 05/14/18 at  1:00 PM EDT by a video enabled telemedicine application and verified that I am speaking with the correct person using two identifiers.   I discussed the limitations of evaluation and management by telemedicine and the availability of in person appointments. The patient expressed understanding and agreed to proceed.  First platform doxy. Audio issues. Then tried face-time and just could connect. 15 minute spent but in end technology failure. So end up calling pt.   History of Present Illness:  Pt states this morning her blood sugar machine may have malfunctioned. Pt sugar got high. She has insulin pump. It stated signal loss as if she was far away from device. She had to change out transmitter sensor after calling company. She assumes her blood sugar were very high.  Alarm on her machine went off.  Pt states her sugar was 158 at 10:49 Then at 12:30 it was 173.  Presently her sugar is 147 on dexcom(her glucometer at home 161)  Pt is on novolog. Pt sees endocrinologist.   Pt has low level ha, nausea and some soreness in body. Some low back pain. Pt has iud removed in february. She states no menses in march. Then on April 20 states she had some heavy bleeding. Some frequent urination.  No cough, fever, sob or wheezing.   Pt states on last appointment with endocrinolgist adjust her insulin pump.      Observations/Objective: No acute distress. Did make video contact and she apperared normal but was having audio issues initially. So called pt.  Assessment and Plan: Patient reports he very symptoms including nausea, frequent urination, mild headache with recent irregular menses.  She attributes symptoms to possible high sugar though her insulin pump never gave her any very high readings.  She also  has glucometer which she checked against the insulin pump reading and they were within 14 points.  No readings that were above 173.  Though she does have a history of DKA in the past and that time she had sugars over 400.  With the irregular light menses this month and no preceding menstrual cycle for about 2 months that she took out her IUD, I do think is a good idea to get urine pregnancy test as well as quantitative hCG.  We will also get a urine and see if she has any ketones and do a urine culture to see if she has any infection present.  Also get a CBC and a metabolic panel.  Regarding her history of diabetes and history of DKA last year, I want her to call her endocrinologist office tomorrow morning and explain issues with her pump yesterday.  Also explained the slight discrepancy between the glucometer and her insulin pump.  I asked her to check her sugar a couple times between now tomorrow morning with glucometer.  Patient expressed understanding on the importance of speaking with endocrinologist office.  Follow-up date to be determined with [place] after lab evaluation/review.  Follow Up Instructions:    I discussed the assessment and treatment plan with the patient. The patient was provided an opportunity to ask questions and all were answered. The patient agreed with the plan and demonstrated an understanding of the instructions.   The patient was advised to call back or seek an in-person evaluation  if the symptoms worsen or if the condition fails to improve as anticipated.     Esperanza RichtersEdward Ade Stmarie, PA-C   Review of Systems  Constitutional: Negative for chills and fatigue.  HENT: Negative for congestion and ear discharge.   Respiratory: Negative for cough, chest tightness, shortness of breath and wheezing.   Cardiovascular: Negative for chest pain and palpitations.  Gastrointestinal: Positive for nausea. Negative for abdominal pain and vomiting.  Genitourinary: Positive for frequency  and vaginal bleeding. Negative for difficulty urinating, flank pain and vaginal pain.       Recently with short menstrual cycle only 3 days.  Musculoskeletal: Positive for back pain. Negative for arthralgias, myalgias, neck pain and neck stiffness.  Skin: Negative for rash.  Neurological: Positive for headaches. Negative for dizziness, tremors, syncope, facial asymmetry, speech difficulty, weakness, light-headedness and numbness.  Hematological: Negative for adenopathy. Does not bruise/bleed easily.       Objective:   Physical Exam  No acute distress.      Assessment & Plan:

## 2018-05-14 NOTE — Patient Instructions (Signed)
Patient reports he very symptoms including nausea, frequent urination, mild headache with recent irregular menses.  She attributes symptoms to possible high sugar though her insulin pump never gave her any very high readings.  She also has glucometer which she checked against the insulin pump reading and they were within 14 points.  No readings that were above 173.  Though she does have a history of DKA in the past and that time she had sugars over 400.  With the irregular light menses this month and no preceding menstrual cycle for about 2 months that she took out her IUD, I do think is a good idea to get urine pregnancy test as well as quantitative hCG.  We will also get a urine and see if she has any ketones and do a urine culture to see if she has any infection present.  Also get a CBC and a metabolic panel.  Regarding her history of diabetes and history of DKA last year, I want her to call her endocrinologist office tomorrow morning and explain issues with her pump yesterday.  Also explained the slight discrepancy between the glucometer and her insulin pump.  I asked her to check her sugar a couple times between now tomorrow morning with glucometer.  Patient expressed understanding on the importance of speaking with endocrinologist office.  Follow-up date to be determined with [place] after lab evaluation/review.

## 2018-05-15 ENCOUNTER — Telehealth: Payer: Self-pay | Admitting: Medical

## 2018-05-15 ENCOUNTER — Encounter: Payer: Self-pay | Admitting: Medical

## 2018-05-15 LAB — CBC WITH DIFFERENTIAL/PLATELET
Basophils Absolute: 0 10*3/uL (ref 0.0–0.1)
Basophils Relative: 0.3 % (ref 0.0–3.0)
Eosinophils Absolute: 0.1 10*3/uL (ref 0.0–0.7)
Eosinophils Relative: 0.7 % (ref 0.0–5.0)
HCT: 37.4 % (ref 36.0–46.0)
Hemoglobin: 12.2 g/dL (ref 12.0–15.0)
Lymphocytes Relative: 20.5 % (ref 12.0–46.0)
Lymphs Abs: 2 10*3/uL (ref 0.7–4.0)
MCHC: 32.6 g/dL (ref 30.0–36.0)
MCV: 81.3 fl (ref 78.0–100.0)
Monocytes Absolute: 0.4 10*3/uL (ref 0.1–1.0)
Monocytes Relative: 4.3 % (ref 3.0–12.0)
Neutro Abs: 7.4 10*3/uL (ref 1.4–7.7)
Neutrophils Relative %: 74.2 % (ref 43.0–77.0)
Platelets: 254 10*3/uL (ref 150.0–400.0)
RBC: 4.6 Mil/uL (ref 3.87–5.11)
RDW: 16.7 % — ABNORMAL HIGH (ref 11.5–15.5)
WBC: 10 10*3/uL (ref 4.0–10.5)

## 2018-05-15 LAB — COMPREHENSIVE METABOLIC PANEL
ALT: 12 U/L (ref 0–35)
AST: 11 U/L (ref 0–37)
Albumin: 4 g/dL (ref 3.5–5.2)
Alkaline Phosphatase: 94 U/L (ref 39–117)
BUN: 13 mg/dL (ref 6–23)
CO2: 26 mEq/L (ref 19–32)
Calcium: 8.9 mg/dL (ref 8.4–10.5)
Chloride: 105 mEq/L (ref 96–112)
Creatinine, Ser: 0.62 mg/dL (ref 0.40–1.20)
GFR: 113.32 mL/min (ref >60.00)
Glucose, Bld: 144 mg/dL — ABNORMAL HIGH (ref 70–99)
Potassium: 4.2 mEq/L (ref 3.5–5.1)
Sodium: 139 mEq/L (ref 135–145)
Total Bilirubin: 0.3 mg/dL (ref 0.2–1.2)
Total Protein: 6.6 g/dL (ref 6.0–8.3)

## 2018-05-15 LAB — URINE CULTURE
MICRO NUMBER:: 417099
Result:: NO GROWTH
SPECIMEN QUALITY:: ADEQUATE

## 2018-05-15 LAB — HCG, QUANTITATIVE, PREGNANCY: Quantitative HCG: <0.6 m[IU]/mL

## 2018-05-15 MED ORDER — ONDANSETRON 4 MG PO TBDP: 4.0000 mg | ORAL_TABLET | Freq: Three times a day (TID) | ORAL | 0 refills | Status: DC | PRN

## 2018-05-15 NOTE — Telephone Encounter (Signed)
Rx zofran sent to pt pharmacy. 

## 2018-05-20 NOTE — Telephone Encounter (Signed)
Opened in error

## 2018-10-02 ENCOUNTER — Telehealth: Payer: Self-pay | Admitting: *Deleted

## 2018-10-02 ENCOUNTER — Ambulatory Visit (INDEPENDENT_AMBULATORY_CARE_PROVIDER_SITE_OTHER): Payer: Medicaid Other

## 2018-10-02 ENCOUNTER — Ambulatory Visit: Payer: Self-pay

## 2018-10-02 ENCOUNTER — Ambulatory Visit (INDEPENDENT_AMBULATORY_CARE_PROVIDER_SITE_OTHER): Payer: Medicaid Other | Admitting: Podiatry

## 2018-10-02 ENCOUNTER — Other Ambulatory Visit: Payer: Self-pay

## 2018-10-02 DIAGNOSIS — M2042 Other hammer toe(s) (acquired), left foot: Secondary | ICD-10-CM

## 2018-10-02 DIAGNOSIS — E1040 Type 1 diabetes mellitus with diabetic neuropathy, unspecified: Secondary | ICD-10-CM | POA: Diagnosis not present

## 2018-10-02 DIAGNOSIS — M7752 Other enthesopathy of left foot: Secondary | ICD-10-CM

## 2018-10-02 DIAGNOSIS — M2041 Other hammer toe(s) (acquired), right foot: Secondary | ICD-10-CM

## 2018-10-02 DIAGNOSIS — M779 Enthesopathy, unspecified: Secondary | ICD-10-CM

## 2018-10-02 DIAGNOSIS — M216X9 Other acquired deformities of unspecified foot: Secondary | ICD-10-CM | POA: Diagnosis not present

## 2018-10-02 DIAGNOSIS — M778 Other enthesopathies, not elsewhere classified: Secondary | ICD-10-CM

## 2018-10-02 NOTE — Telephone Encounter (Signed)
"  I'm calling to get my appointment that was set up for October 21 rescheduled to March or early April.  Please give me a call back."

## 2018-10-02 NOTE — Patient Instructions (Signed)

## 2018-10-05 NOTE — Progress Notes (Signed)
Subjective: 28 year old female presents the office today for pain to both feet.  She has moderate discomfort in her left fourth toe and also the right foot on the area of painful callus.  She previously underwent metatarsal head excision on the left foot which is done well as well as the surgery on the right side given the continued pain and callus.  Also she is Artie noticed the fourth toes curling of her left foot and this is causing discomfort as well.  She states that her blood sugar has been well and her A1c is 6. Denies any systemic complaints such as fevers, chills, nausea, vomiting. No acute changes since last appointment, and no other complaints at this time.   Objective: AAO x3, NAD DP/PT pulses palpable bilaterally, CRT less than 3 seconds Protective sensation intact with Simms Weinstein monofilament, vibratory prominence of the fifth metatarsal head plantarly on the right foot.  Adductovarus is present of fourth digits bilaterally causing discomfort.  There is no skin breakdown identified at this time there is no erythema or warmth.  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  X-rays obtained reviewed.  No evidence of acute fracture.  Digital block is present. -We discussed with conservative as well as surgical treatment options.  She has been wearing inserts and she is tried changing shoes and despite this she still having pain mostly to the right foot the fifth metatarsal head as well as the 4th digits -Ultimately she wants to go ahead and proceed with surgical intervention. She wants to have surgery on the right foot. Discussed 5th metatarsal head excision and repair of 4th digit deformity.  -The incision placement as well as the postoperative course was discussed with the patient.  I discussed risks of the surgery which include, but not limited to, infection, bleeding, pain, swelling, need for further surgery, delayed or nonhealing, painful or ugly scar, numbness or sensation changes, over/under correction, recurrence, transfer lesions, further deformity, hardware failure, DVT/PE, loss of toe/foot. Patient understands these risks and wishes to proceed with surgery. The surgical consent was reviewed with the patient all 3 pages were signed. No promises or guarantees were given to the outcome of the procedure. All questions were answered to the best of my ability. Before the surgery the patient was encouraged to call the office if there is any further questions. The surgery will be performed at the [facility] on an outpatient basis. -Patient encouraged to call the office with any questions, concerns, change in symptoms.   Trula Slade DPM

## 2018-10-06 ENCOUNTER — Telehealth: Payer: Self-pay | Admitting: Podiatry

## 2018-10-06 NOTE — Telephone Encounter (Signed)
Pt needs a note for work saying she has to wear the diabetic shoes she got from [employer] due to her diabetes. States she needs the note by tomorrow as she starts her job on Thursday. Requested it be sent to MyChart or she can come in tomorrow and pick up.

## 2018-10-06 NOTE — Telephone Encounter (Signed)
I am returning your call.  You will need to schedule an appointment to come back in to see Dr. Jacqualyn Posey for another consultation around January.  We do not have a schedule set up for March yet.  We'll schedule your surgery when you come back in to see Dr. Jacqualyn Posey.  "That's fine.  I can't do it right now.  My boyfriend and I discussed it.  We can't do it with Covid going on.  We both can't afford to be out of work at the same time.  We don't know how this Covid is going to affect [place].  So that is why I want to wait on doing the surgery."  I understand.  Would you like me to transfer you to an appointment scheduler so you can schedule that appointment?  "Yes, that will be fine.  I have MyChart so it will remind me of my appointment.  I can also set a reminder on my calendar."  I transferred her to [place].

## 2018-10-07 NOTE — Telephone Encounter (Signed)
I would be good to wear the diabetic shoes given her history of ulceration/infection if they allow it.

## 2018-10-07 NOTE — Telephone Encounter (Signed)
Pt called back in regards to message she left yesterday about wearing her diabetic shoes to work. States she her job requires black shoes are to be worn but she has to wear her diabetic shoes from [employer].

## 2018-10-07 NOTE — Telephone Encounter (Signed)
I informed pt of Dr. Leigh Aurora recommendation and that I could email to pt and she agreed.

## 2018-10-22 ENCOUNTER — Telehealth: Payer: Self-pay | Admitting: *Deleted

## 2018-10-22 NOTE — Telephone Encounter (Signed)
[name] verified that the patient has medicaid for the month of October 2020. Molly Savage

## 2018-10-30 ENCOUNTER — Telehealth: Payer: Self-pay | Admitting: *Deleted

## 2018-10-30 NOTE — Telephone Encounter (Signed)
"  We called the patient and she said she called the office and canceled her surgery for 11/11/2018.  She said she told you all that she wanted to wait until the beginning of next year."  I haven't received a call from her.  I will cancel it and let Dr.  Jacqualyn Posey know.

## 2018-11-17 ENCOUNTER — Telehealth: Payer: Self-pay | Admitting: Family Medicine

## 2018-11-17 NOTE — Telephone Encounter (Signed)
She would be best served by calling her endocrinologist regarding this. Ty.

## 2018-11-17 NOTE — Telephone Encounter (Signed)
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  Pt works for [employer] and they require a Doctor's note/order for this type of accomodation.  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    Call pt for further questions/informartion:  [phone]

## 2018-11-17 NOTE — Telephone Encounter (Signed)
Called left message to call back 

## 2018-11-17 NOTE — Telephone Encounter (Signed)
Patient notified that she needs to call her endocrinologist.

## 2018-11-20 ENCOUNTER — Other Ambulatory Visit: Payer: Self-pay | Admitting: Family Medicine

## 2018-11-25 ENCOUNTER — Ambulatory Visit (INDEPENDENT_AMBULATORY_CARE_PROVIDER_SITE_OTHER): Payer: Medicaid Other | Admitting: Family Medicine

## 2018-11-25 ENCOUNTER — Encounter: Payer: Self-pay | Admitting: Family Medicine

## 2018-11-25 ENCOUNTER — Other Ambulatory Visit: Payer: Self-pay

## 2018-11-25 DIAGNOSIS — F4323 Adjustment disorder with mixed anxiety and depressed mood: Secondary | ICD-10-CM | POA: Diagnosis not present

## 2018-11-25 DIAGNOSIS — K219 Gastro-esophageal reflux disease without esophagitis: Secondary | ICD-10-CM

## 2018-11-25 MED ORDER — BUPROPION HCL ER (XL) 150 MG PO TB24: 150.0000 mg | ORAL_TABLET | Freq: Every day | ORAL | 3 refills | Status: DC

## 2018-11-25 MED ORDER — PANTOPRAZOLE SODIUM 40 MG PO TBEC: 40.0000 mg | DELAYED_RELEASE_TABLET | Freq: Every day | ORAL | 2 refills | Status: DC

## 2018-11-25 NOTE — Progress Notes (Addendum)
Chief Complaint  Patient presents with  . Emesis  . Abdominal Pain    Subjective: Patient is a 28 y.o. female here for abd discomfort. Due to COVID-19 pandemic, we are interacting via web portal for an electronic face-to-face visit. I verified patient's ID using 2 identifiers. Patient agreed to proceed with visit via this method. Patient is at home, I am at office. Patient and I are present for visit.   For the past 2 weeks, the patient has been having epigastric abdominal pain after eating.  There seems to be no correlation with specific types of foods.  She continues to have reflux symptoms as well.  She is taking Prilosec 20 mg daily but notices no improvement.  No recent weight changes or bowel changes.  No injury or sick contacts.  She is having some nausea but no vomiting.  Patient continues have issues with poor memory.  Stress levels have been high as well.  She used to see psychiatry and was put on a handful of medications that did not work.  She ended up not following up.  She used to be on Zoloft which was helpful, but it stopped being so.  She is not currently on anything.  Her mother was on Wellbutrin for similar issues and reported good success.  ROS: GI: +nausea Psych:As noted in HPI  Past Medical History:  Diagnosis Date  . Diabetes mellitus type 1 ([place])    Follows with Endo  . Tobacco abuse   . Wears glasses     Objective: No conversational dyspnea Age appropriate judgment and insight Nml affect and mood  Assessment and Plan: Gastroesophageal reflux disease, unspecified whether esophagitis present - Plan: pantoprazole (PROTONIX) 40 MG tablet  Situational mixed anxiety and depressive disorder - Plan: buPROPion (WELLBUTRIN XL) 150 MG 24 hr tablet  1-change omeprazole to pantoprazole.  Elevate head of bed.  Try to find out if there are food triggers.  She did mention a history of gastroparesis.  Because have been no bowel changes, I would like to see how she does on  pantoprazole first.  If there are no changes, will consider starting Reglan. 2-start Wellbutrin. Follow-up in 6 weeks. The patient voiced understanding and agreement to the plan.  [place], DO 11/25/18  11:38 AM

## 2018-12-09 ENCOUNTER — Other Ambulatory Visit: Payer: Self-pay

## 2018-12-10 ENCOUNTER — Encounter: Payer: Self-pay | Admitting: Medical

## 2018-12-10 ENCOUNTER — Ambulatory Visit: Payer: Medicaid Other | Admitting: Medical

## 2018-12-10 VITALS — BP 150/90 | HR 73 | Temp 96.2°F | Resp 16 | Ht 68.5 in | Wt 266.4 lb

## 2018-12-10 DIAGNOSIS — R109 Unspecified abdominal pain: Secondary | ICD-10-CM

## 2018-12-10 DIAGNOSIS — R5383 Other fatigue: Secondary | ICD-10-CM | POA: Diagnosis not present

## 2018-12-10 DIAGNOSIS — I10 Essential (primary) hypertension: Secondary | ICD-10-CM | POA: Diagnosis not present

## 2018-12-10 LAB — CBC WITH DIFFERENTIAL/PLATELET
Basophils Absolute: 0 10*3/uL (ref 0.0–0.1)
Basophils Relative: 0.5 % (ref 0.0–3.0)
Eosinophils Absolute: 0.1 10*3/uL (ref 0.0–0.7)
Eosinophils Relative: 1.1 % (ref 0.0–5.0)
HCT: 35.5 % — ABNORMAL LOW (ref 36.0–46.0)
Hemoglobin: 11.4 g/dL — ABNORMAL LOW (ref 12.0–15.0)
Lymphocytes Relative: 32 % (ref 12.0–46.0)
Lymphs Abs: 2.1 10*3/uL (ref 0.7–4.0)
MCHC: 32.1 g/dL (ref 30.0–36.0)
MCV: 80.2 fl (ref 78.0–100.0)
Monocytes Absolute: 0.5 10*3/uL (ref 0.1–1.0)
Monocytes Relative: 8.2 % (ref 3.0–12.0)
Neutro Abs: 3.9 10*3/uL (ref 1.4–7.7)
Neutrophils Relative %: 58.2 % (ref 43.0–77.0)
Platelets: 242 10*3/uL (ref 150.0–400.0)
RBC: 4.43 Mil/uL (ref 3.87–5.11)
RDW: 18.1 % — ABNORMAL HIGH (ref 11.5–15.5)
WBC: 6.6 10*3/uL (ref 4.0–10.5)

## 2018-12-10 LAB — COMPREHENSIVE METABOLIC PANEL
ALT: 14 U/L (ref 0–35)
AST: 12 U/L (ref 0–37)
Albumin: 4 g/dL (ref 3.5–5.2)
Alkaline Phosphatase: 89 U/L (ref 39–117)
BUN: 13 mg/dL (ref 6–23)
CO2: 26 mEq/L (ref 19–32)
Calcium: 9 mg/dL (ref 8.4–10.5)
Chloride: 107 mEq/L (ref 96–112)
Creatinine, Ser: 0.66 mg/dL (ref 0.40–1.20)
GFR: 105.02 mL/min (ref >60.00)
Glucose, Bld: 57 mg/dL — ABNORMAL LOW (ref 70–99)
Potassium: 4.1 mEq/L (ref 3.5–5.1)
Sodium: 140 mEq/L (ref 135–145)
Total Bilirubin: 0.4 mg/dL (ref 0.2–1.2)
Total Protein: 6.5 g/dL (ref 6.0–8.3)

## 2018-12-10 LAB — VITAMIN B12: Vitamin B-12: 278 pg/mL (ref 211–911)

## 2018-12-10 LAB — HCG, QUANTITATIVE, PREGNANCY: Quantitative HCG: <0.6 m[IU]/mL

## 2018-12-10 LAB — LIPASE: Lipase: 3 U/L — ABNORMAL LOW (ref 11.0–59.0)

## 2018-12-10 LAB — TSH: TSH: 1.94 u[IU]/mL (ref 0.35–4.50)

## 2018-12-10 MED ORDER — LABETALOL HCL 100 MG PO TABS: ORAL_TABLET | ORAL | 0 refills | Status: DC

## 2018-12-10 NOTE — Progress Notes (Signed)
Subjective:    Patient ID: Molly Savage, female    DOB: [birth year], 28 y.o.   MRN: [id number]  HPI  Pt in for recent sensation of random sharp, stabbing pain all over her stomach. This has been going on for about 2 weeks. Pt does have some gastroparesis. Pt states mentioned this to Dr. Nani Ravens and he changed her ppi from prilosec to pantoprazole. Sometimes sharp pain occurs after eating. Other times can occur without eating. But no just in epigstric area. Can occur in other areas all over.  Occurs 2-3 times a day last for about 5-15 minutes.  She does report some daily fatigue that is mild.  Pt last bm was today. She has no constipation. Normal bm today.  Pt last menses period was on 11-29-2018. Her pregnancy test came back negative. Pt states does states she wants to get pregnant in furure.     Review of Systems  Constitutional: Positive for fatigue. Negative for chills and fever.  HENT: Negative for congestion and ear pain.   Respiratory: Negative for cough, chest tightness, shortness of breath and wheezing.   Cardiovascular: Negative for chest pain and palpitations.  Gastrointestinal: Positive for abdominal pain. Negative for abdominal distention, constipation, diarrhea, nausea and vomiting.  Genitourinary: Negative for difficulty urinating, dysuria, frequency, pelvic pain and urgency.  Musculoskeletal: Negative for back pain.  Skin: Negative for rash.  Psychiatric/Behavioral: Negative for behavioral problems.    Past Medical History:  Diagnosis Date  . Diabetes mellitus type 1 ([place])    Follows with Endo  . Tobacco abuse   . Wears glasses      Social History   Socioeconomic History  . Marital status: Significant Other    Spouse name: Not on file  . Number of children: Not on file  . Years of education: Not on file  . Highest education level: Not on file  Occupational History  . Not on file  Social Needs  . Financial resource strain: Not on file  . Food  insecurity    Worry: Not on file    Inability: Not on file  . Transportation needs    Medical: Not on file    Non-medical: Not on file  Tobacco Use  . Smoking status: Current Every Day Smoker    Packs/day: 0.50    Years: 12.00    Pack years: 6.00    Types: Cigarettes  . Smokeless tobacco: Never Used  Substance and Sexual Activity  . Alcohol use: No  . Drug use: No  . Sexual activity: Not on file  Lifestyle  . Physical activity    Days per week: Not on file    Minutes per session: Not on file  . Stress: Not on file  Relationships  . Social Herbalist on phone: Not on file    Gets together: Not on file    Attends religious service: Not on file    Active member of club or organization: Not on file    Attends meetings of clubs or organizations: Not on file    Relationship status: Not on file  . Intimate partner violence    Fear of current or ex partner: Not on file    Emotionally abused: Not on file    Physically abused: Not on file    Forced sexual activity: Not on file  Other Topics Concern  . Not on file  Social History Narrative  . Not on file    Past Surgical History:  Procedure Laterality Date  . CESAREAN SECTION N/A 02/01/2016   Procedure: CESAREAN SECTION;  Surgeon: Mora Bellman, MD;  Location: [facility];  Service: Obstetrics;  Laterality: N/A;  . FOOT SURGERY Left     Family History  Problem Relation Age of Onset  . Hypertension Mother   . Bipolar disorder Sister   . Heart disease Maternal Uncle   . ADD / ADHD Brother     No Known Allergies  Current Outpatient Medications on File Prior to Visit  Medication Sig Dispense Refill  . buPROPion (WELLBUTRIN XL) 150 MG 24 hr tablet Take 1 tablet (150 mg total) by mouth daily. 30 tablet 3  . ibuprofen (ADVIL,MOTRIN) 800 MG tablet Take 1 tablet (800 mg total) by mouth every 8 (eight) hours as needed. 30 tablet 2  . insulin aspart (NOVOLOG) 100 UNIT/ML injection Use as directed in insulin  pump. MDD = 125 units. DX. E10.65    . Insulin Infusion Pump (MINIMED 630G INSULIN PUMP) KIT by Does not apply route.    Marland Kitchen levocetirizine (XYZAL) 5 MG tablet Take 1 tablet (5 mg total) by mouth every evening. 30 tablet 2  . omeprazole (PRILOSEC) 20 MG capsule TAKE 1 CAPSULE BY MOUTH TWICE DAILY BEFORE A MEAL 180 capsule 0  . pantoprazole (PROTONIX) 40 MG tablet Take 1 tablet (40 mg total) by mouth daily. 30 tablet 2   No current facility-administered medications on file prior to visit.     BP (!) 161/86   Pulse 73   Temp (!) 96.2 F (35.7 C) (Temporal)   Resp 16   Ht 5' 8.5" (1.74 m)   Wt 266 lb 6.4 oz (120.8 kg)   SpO2 100%   BMI 39.92 kg/m       Objective:   Physical Exam  General Mental Status- Alert. General Appearance- Not in acute distress.   Skin General: Color- Normal Color. Moisture- Normal Moisture.  Neck Carotid Arteries- Normal color. Moisture- Normal Moisture. No carotid bruits. No JVD.  Chest and Lung Exam Auscultation: Breath Sounds:-Normal.  Cardiovascular Auscultation:Rythm- Regular. Murmurs & Other Heart Sounds:Auscultation of the heart reveals- No Murmurs.  Abdomen Inspection:-Inspeection Normal. Palpation/Percussion:Note:No mass. Palpation and Percussion of the abdomen reveal- Non Tender, Non Distended + BS, no rebound or guarding.(none presently but states left upper quadrant, epigatric and rt of umbilicus are most common locations)  Neurologic Cranial Nerve exam:- CN III-XII intact(No nystagmus), symmetric smile. Strength:- 5/5 equal and symmetric strength both upper and lower extremities.      Assessment & Plan:  For abdomen pain which has some atypical features will get cbc, cmp, and lipase. Continue current meds. If your studies are negative might add on h2 blocker.  Will get hcg blood work. If negative might get abd xray or [place].  For htn, rx labetolol. Pt diabetic and since desires preg in future rx labetolol.  For fatigiue will get  b12, tsh, b1 and vit d.  Follow up in 2 weeks or as needed.  Did recommend getting otc bp cuff.  40 minutes spent with pt. 50% of time spent counseling pt on plan going forward and answering pt questions.  Mackie Pai, PA-C

## 2018-12-10 NOTE — Patient Instructions (Signed)
For abdomen pain which has some atypical features will get cbc, cmp, and lipase. Continue current meds. If your studies are negative might add on h2 blocker.  Will get hcg blood work. If negative might get abd xray or [place].  For htn, rx labetolol. Pt diabetic and since desires preg in future rx labetolol.  For fatigiue will get b12, tsh, b1 and vit d.  Follow up in 2 weeks or as needed.  Did recommend getting otc bp cuff.

## 2018-12-11 ENCOUNTER — Encounter: Payer: Self-pay | Admitting: Medical

## 2018-12-12 ENCOUNTER — Encounter: Payer: Self-pay | Admitting: Medical

## 2018-12-13 ENCOUNTER — Telehealth: Payer: Self-pay | Admitting: Medical

## 2018-12-13 DIAGNOSIS — K3184 Gastroparesis: Secondary | ICD-10-CM

## 2018-12-13 DIAGNOSIS — K219 Gastro-esophageal reflux disease without esophagitis: Secondary | ICD-10-CM

## 2018-12-13 LAB — VITAMIN D 1,25 DIHYDROXY
Vitamin D 1, 25 (OH)2 Total: 59 pg/mL (ref 18–72)
Vitamin D2 1, 25 (OH)2: <8 pg/mL
Vitamin D3 1, 25 (OH)2: 59 pg/mL

## 2018-12-13 MED ORDER — FAMOTIDINE 20 MG PO TABS: 20.0000 mg | ORAL_TABLET | Freq: Every day | ORAL | 1 refills | Status: DC

## 2018-12-13 NOTE — Telephone Encounter (Signed)
Rx pepcid sent in and refer to gi.

## 2018-12-14 LAB — VITAMIN B1: Vitamin B1 (Thiamine): 7 nmol/L — ABNORMAL LOW (ref 8–30)

## 2018-12-24 ENCOUNTER — Other Ambulatory Visit: Payer: Self-pay

## 2018-12-24 ENCOUNTER — Encounter: Payer: Self-pay | Admitting: Medical

## 2018-12-24 ENCOUNTER — Ambulatory Visit: Payer: Medicaid Other | Admitting: Medical

## 2018-12-24 VITALS — BP 131/71 | HR 90 | Temp 97.1°F | Resp 16 | Ht 68.5 in | Wt 269.4 lb

## 2018-12-24 DIAGNOSIS — F331 Major depressive disorder, recurrent, moderate: Secondary | ICD-10-CM | POA: Diagnosis not present

## 2018-12-24 DIAGNOSIS — K3184 Gastroparesis: Secondary | ICD-10-CM | POA: Diagnosis not present

## 2018-12-24 DIAGNOSIS — E519 Thiamine deficiency, unspecified: Secondary | ICD-10-CM

## 2018-12-24 DIAGNOSIS — K219 Gastro-esophageal reflux disease without esophagitis: Secondary | ICD-10-CM

## 2018-12-24 MED ORDER — FAMOTIDINE 20 MG PO TABS: 20.0000 mg | ORAL_TABLET | Freq: Two times a day (BID) | ORAL | 3 refills | Status: DC

## 2018-12-24 NOTE — Progress Notes (Signed)
Subjective:    Patient ID: Molly Savage, female    DOB: [birth year], 30 y.o.   MRN: [id number]  HPI  Pt in for follow up.  Pt states her stomach pain seems to much better. She is on pantoprazole 40 mg q day. I had added famotadine and it does seem to help but only for half a day. She thinks needs meds twice a day.  Pt had been to various GI MD but never had egd.  Told had gastroparesis.  Pt has mild low b1. She has not started med yet.   She also still complains of baseline memory issues. Hx of ADD. Some depression in past. On office diagnosed bipolar. currenltly mood stable.   Review of Systems  Constitutional: Negative for chills, fatigue and fever.  Respiratory: Negative for cough, chest tightness, shortness of breath and wheezing.   Cardiovascular: Negative for chest pain and palpitations.  Gastrointestinal: Positive for abdominal pain. Negative for abdominal distention, blood in stool, constipation and nausea.  Genitourinary: Negative for dysuria and enuresis.  Musculoskeletal: Negative for back pain.  Neurological: Negative for dizziness and headaches.       Pt reviews with me that get has short term memory issues. And describes being distracted.  Hematological: Negative for adenopathy. Does not bruise/bleed easily.  Psychiatric/Behavioral: Positive for decreased concentration. Negative for behavioral problems, confusion, dysphoric mood and sleep disturbance.    Past Medical History:  Diagnosis Date  . Diabetes mellitus type 1 ([place])    Follows with Endo  . Tobacco abuse   . Wears glasses      Social History   Socioeconomic History  . Marital status: Significant Other    Spouse name: Not on file  . Number of children: Not on file  . Years of education: Not on file  . Highest education level: Not on file  Occupational History  . Not on file  Social Needs  . Financial resource strain: Not on file  . Food insecurity    Worry: Not on file    Inability: Not  on file  . Transportation needs    Medical: Not on file    Non-medical: Not on file  Tobacco Use  . Smoking status: Current Every Day Smoker    Packs/day: 0.50    Years: 12.00    Pack years: 6.00    Types: Cigarettes  . Smokeless tobacco: Never Used  Substance and Sexual Activity  . Alcohol use: No  . Drug use: No  . Sexual activity: Not on file  Lifestyle  . Physical activity    Days per week: Not on file    Minutes per session: Not on file  . Stress: Not on file  Relationships  . Social Herbalist on phone: Not on file    Gets together: Not on file    Attends religious service: Not on file    Active member of club or organization: Not on file    Attends meetings of clubs or organizations: Not on file    Relationship status: Not on file  . Intimate partner violence    Fear of current or ex partner: Not on file    Emotionally abused: Not on file    Physically abused: Not on file    Forced sexual activity: Not on file  Other Topics Concern  . Not on file  Social History Narrative  . Not on file    Past Surgical History:  Procedure Laterality Date  .  CESAREAN SECTION N/A 02/01/2016   Procedure: CESAREAN SECTION;  Surgeon: Mora Bellman, MD;  Location: [facility];  Service: Obstetrics;  Laterality: N/A;  . FOOT SURGERY Left     Family History  Problem Relation Age of Onset  . Hypertension Mother   . Bipolar disorder Sister   . Heart disease Maternal Uncle   . ADD / ADHD Brother     No Known Allergies  Current Outpatient Medications on File Prior to Visit  Medication Sig Dispense Refill  . buPROPion (WELLBUTRIN XL) 150 MG 24 hr tablet Take 1 tablet (150 mg total) by mouth daily. 30 tablet 3  . ibuprofen (ADVIL,MOTRIN) 800 MG tablet Take 1 tablet (800 mg total) by mouth every 8 (eight) hours as needed. 30 tablet 2  . insulin aspart (NOVOLOG) 100 UNIT/ML injection Use as directed in insulin pump. MDD = 125 units. DX. E10.65    . Insulin  Infusion Pump (MINIMED 630G INSULIN PUMP) KIT by Does not apply route.    . labetalol (NORMODYNE) 100 MG tablet 1 tab po q day 30 tablet 0  . omeprazole (PRILOSEC) 20 MG capsule TAKE 1 CAPSULE BY MOUTH TWICE DAILY BEFORE A MEAL 180 capsule 0  . pantoprazole (PROTONIX) 40 MG tablet Take 1 tablet (40 mg total) by mouth daily. 30 tablet 2  . levocetirizine (XYZAL) 5 MG tablet Take 1 tablet (5 mg total) by mouth every evening. (Patient not taking: Reported on 12/24/2018) 30 tablet 2   No current facility-administered medications on file prior to visit.     BP 131/71   Pulse 90   Temp (!) 97.1 F (36.2 C) (Temporal)   Resp 16   Ht 5' 8.5" (1.74 m)   Wt 269 lb 6.4 oz (122.2 kg)   SpO2 100%   BMI 40.37 kg/m       Objective:   Physical Exam  General Mental Status- Alert. General Appearance- Not in acute distress.   Skin General: Color- Normal Color. Moisture- Normal Moisture.  Neck Carotid Arteries- Normal color. Moisture- Normal Moisture. No carotid bruits. No JVD.  Chest and Lung Exam Auscultation: Breath Sounds:-Normal.  Cardiovascular Auscultation:Rythm- Regular. Murmurs & Other Heart Sounds:Auscultation of the heart reveals- No Murmurs.  Abdomen Inspection:-Inspeection Normal. Palpation/Percussion:Note:No mass. Palpation and Percussion of the abdomen reveal- Non Tender, Non Distended + BS, no rebound or guarding.   Neurologic Cranial Nerve exam:- CN III-XII intact(No nystagmus), symmetric smile. Strength:- 5/5 equal and symmetric strength both upper and lower extremities.      Assessment & Plan:  For hx of abdomen pain/likely gerd, I refilled famotadine but increased to twice a day. Continue protonix. Recommend go upstairs to gi office and have them look at referral I had placed. Get scheduled.  For low b1, get b1 otc and take daily. Will see if this has I,mprovement on your memory. Have seen b12 supplament help  low but not b1.  Some of memory issues may be ADD  related. Continue wellbutrin.  Update [place] in 2 weeks if any memory improvement. Could get pcp opinion on referral to neuropysychologsit for testing. At your young age memory issues not real common. Concentration may be the more significant issue. Consider increase wellbutrin to 300 mg daily.  Follow up in 2 week or as needed  25 minutes spent with pt. 50% of time spent counseling pt on plan going forward  [employer], [employer]

## 2018-12-24 NOTE — Patient Instructions (Signed)
For hx of abdomen pain/likely gerd, I refilled famotadine but increased to twice a day. Continue protonix. Recommend go upstairs to gi office and have them look at referral I had placed. Get scheduled.  For low b1, get b1 otc and take daily. Will see if this has I,mprovement on your memory. Have seen b12 supplament help  low but not b1.  Some of memory issues may be ADD related. Continue wellbutrin.  Update [place] in 2 weeks if any memory improvement. Could get pcp opinion on referral to neuropysychologsit for testing. At your young age memory issues not real common. Concentration may be the more significant issue. Consider increase wellbutrin to 300 mg daily.  Follow up in 2 week or as needed

## 2018-12-25 ENCOUNTER — Encounter: Payer: Self-pay | Admitting: Medical

## 2018-12-28 ENCOUNTER — Ambulatory Visit: Payer: Medicaid Other | Admitting: Gastroenterology

## 2018-12-31 ENCOUNTER — Other Ambulatory Visit: Payer: Self-pay | Admitting: Podiatry

## 2018-12-31 ENCOUNTER — Ambulatory Visit: Payer: Medicaid Other | Admitting: Podiatry

## 2018-12-31 ENCOUNTER — Ambulatory Visit (INDEPENDENT_AMBULATORY_CARE_PROVIDER_SITE_OTHER): Payer: Medicaid Other

## 2018-12-31 ENCOUNTER — Encounter: Payer: Self-pay | Admitting: Podiatry

## 2018-12-31 ENCOUNTER — Other Ambulatory Visit: Payer: Self-pay

## 2018-12-31 DIAGNOSIS — M2041 Other hammer toe(s) (acquired), right foot: Secondary | ICD-10-CM

## 2018-12-31 DIAGNOSIS — M7742 Metatarsalgia, left foot: Secondary | ICD-10-CM

## 2018-12-31 DIAGNOSIS — M2042 Other hammer toe(s) (acquired), left foot: Secondary | ICD-10-CM

## 2018-12-31 DIAGNOSIS — M216X9 Other acquired deformities of unspecified foot: Secondary | ICD-10-CM

## 2018-12-31 DIAGNOSIS — M1990 Unspecified osteoarthritis, unspecified site: Secondary | ICD-10-CM

## 2018-12-31 DIAGNOSIS — M7751 Other enthesopathy of right foot: Secondary | ICD-10-CM | POA: Diagnosis not present

## 2018-12-31 DIAGNOSIS — M79674 Pain in right toe(s): Secondary | ICD-10-CM

## 2018-12-31 DIAGNOSIS — M659 Synovitis and tenosynovitis, unspecified: Secondary | ICD-10-CM

## 2018-12-31 DIAGNOSIS — M65971 Unspecified synovitis and tenosynovitis, right ankle and foot: Secondary | ICD-10-CM

## 2018-12-31 DIAGNOSIS — M65972 Unspecified synovitis and tenosynovitis, left ankle and foot: Secondary | ICD-10-CM

## 2018-12-31 DIAGNOSIS — M7741 Metatarsalgia, right foot: Secondary | ICD-10-CM

## 2019-01-03 LAB — C-REACTIVE PROTEIN: CRP: 2.4 mg/L (ref <8.0)

## 2019-01-03 LAB — HLA-B27 ANTIGEN: HLA-B27 Antigen: NEGATIVE

## 2019-01-03 LAB — SEDIMENTATION RATE: Sed Rate: 14 mm/h (ref 0–20)

## 2019-01-03 LAB — ANA: Anti Nuclear Antibody (ANA): NEGATIVE

## 2019-01-03 LAB — RHEUMATOID FACTOR: Rheumatoid fact SerPl-aCnc: <14 IU/mL (ref <14)

## 2019-01-04 ENCOUNTER — Encounter: Payer: Self-pay | Admitting: Podiatry

## 2019-01-11 NOTE — Progress Notes (Signed)
Subjective: 28 year old female presents the office today for concerns of her left fourth toe stating that the "knot" on the toe is getting larger and causes irritation inside shoes.  On the right foot she states that she still gets pain pointing to submetatarsal 5 as well as her toes starting to turn out.  No recent injury to her feet.  No swelling.Denies any systemic complaints such as fevers, chills, nausea, vomiting. No acute changes since last appointment, and no other complaints at this time.   Objective: AAO x3, NAD DP/PT pulses palpable bilaterally, CRT less than 3 seconds Semirigid hammertoe contracture present left fourth toe.  Mild erythema dorsal PIPJ from irritation in shoes.  Symptomatic hyperkeratotic lesion right foot metatarsal 5 causing discomfort.  No ongoing ulceration.  Semirigid hammertoe contracture present of the right side.  On the right side there is lateral deviation of the digits. No pain with calf compression, swelling, warmth, erythema  Assessment: Left fourth symptomatic hammertoe deformity; metatarsalgia/hammertoe right foot  Plan: -All treatment options discussed with the patient including all alternatives, risks, complications.  -X-rays obtained reviewed.  Hammertoe contractures present.  There is no evidence of acute fracture. -Given the lateral deviation of the digits and chronic foot pain will order arthritic panel blood work. -Discussed surgical intervention bilateral forearms of the blood work.  Likely will do the left fourth toe as this is becoming more symptomatic than the right foot. -Patient encouraged to call the office with any questions, concerns, change in symptoms.   Trula Slade DPM

## 2019-01-12 ENCOUNTER — Encounter: Payer: Self-pay | Admitting: Medical

## 2019-01-12 ENCOUNTER — Other Ambulatory Visit: Payer: Self-pay | Admitting: Family Medicine

## 2019-01-12 MED ORDER — LABETALOL HCL 100 MG PO TABS: ORAL_TABLET | ORAL | 0 refills | Status: DC

## 2019-01-12 NOTE — Telephone Encounter (Signed)
Pt lost bottle of labetalol (NORMODYNE) 100 MG tablet Is requesting refill, she took last pill yesterday. Please advise

## 2019-01-25 ENCOUNTER — Ambulatory Visit: Payer: Medicaid Other | Admitting: Podiatry

## 2019-01-28 ENCOUNTER — Other Ambulatory Visit: Payer: Self-pay

## 2019-01-28 ENCOUNTER — Encounter: Payer: Self-pay | Admitting: Podiatry

## 2019-01-28 ENCOUNTER — Ambulatory Visit (INDEPENDENT_AMBULATORY_CARE_PROVIDER_SITE_OTHER): Payer: Medicaid Other | Admitting: Podiatry

## 2019-01-28 DIAGNOSIS — E1040 Type 1 diabetes mellitus with diabetic neuropathy, unspecified: Secondary | ICD-10-CM

## 2019-01-28 DIAGNOSIS — M2042 Other hammer toe(s) (acquired), left foot: Secondary | ICD-10-CM

## 2019-01-28 NOTE — Patient Instructions (Signed)

## 2019-02-01 NOTE — Progress Notes (Signed)
Subjective: 29 year old female presents the office today for surgical consultation given hammertoes in the left foot.  She states that the fourth toe is continued to curl up causing discomfort but she is also noticed the third toe curving as well as the fifth toe.  She does get red spots the toes from rubbing inside shoes but no skin breakdown.  Her last A1c she reports is 7.  She denies any claudication symptoms. Denies any systemic complaints such as fevers, chills, nausea, vomiting. No acute changes since last appointment, and no other complaints at this time.   Objective: AAO x3, NAD DP/PT pulses palpable bilaterally, CRT less than 3 seconds Hammertoe deformity present the left fourth and fifth toe with mild erythema the dorsal PIPJ from irritation inside shoes.  The third digit also contracted and putting pressure to the second digit with a pinch callus present.  No open lesions identified at this time. No open lesions or pre-ulcerative lesions.  No pain with calf compression, swelling, warmth, erythema  Assessment: Symptomatic hammertoes left foot  Plan: -All treatment options discussed with the patient including all alternatives, risks, complications.  -Reviewed the x-rays with her.  We discussed with conservative as well as surgical treatment options.  At this time she wishes to proceed with surgical intervention.  Discussed with her hammertoe repair of the left third, fourth, fifth digit with pin fixation of the third and fourth toes. -The incision placement as well as the postoperative course was discussed with the patient. I discussed risks of the surgery which include, but not limited to, infection, bleeding, pain, swelling, need for further surgery, delayed or nonhealing, painful or ugly scar, numbness or sensation changes, over/under correction, recurrence, transfer lesions, further deformity, hardware failure, DVT/PE, loss of toe/foot. Patient understands these risks and wishes to  proceed with surgery. The surgical consent was reviewed with the patient all 3 pages were signed. No promises or guarantees were given to the outcome of the procedure. All questions were answered to the best of my ability. Before the surgery the patient was encouraged to call the office if there is any further questions. The surgery will be performed at the [facility] on an outpatient basis. -Patient encouraged to call the office with any questions, concerns, change in symptoms.   Vivi Barrack DPM

## 2019-02-03 ENCOUNTER — Other Ambulatory Visit: Payer: Self-pay | Admitting: Medical

## 2019-02-12 ENCOUNTER — Ambulatory Visit (INDEPENDENT_AMBULATORY_CARE_PROVIDER_SITE_OTHER): Payer: Medicaid Other | Admitting: Gastroenterology

## 2019-02-12 ENCOUNTER — Telehealth: Payer: Self-pay

## 2019-02-12 ENCOUNTER — Encounter: Payer: Self-pay | Admitting: Gastroenterology

## 2019-02-12 VITALS — BP 130/80 | HR 88 | Temp 98.5°F | Ht 68.5 in | Wt 270.2 lb

## 2019-02-12 DIAGNOSIS — Z01818 Encounter for other preprocedural examination: Secondary | ICD-10-CM

## 2019-02-12 DIAGNOSIS — K219 Gastro-esophageal reflux disease without esophagitis: Secondary | ICD-10-CM | POA: Diagnosis not present

## 2019-02-12 MED ORDER — FAMOTIDINE 20 MG PO TABS: 20.0000 mg | ORAL_TABLET | ORAL | 11 refills | Status: DC

## 2019-02-12 MED ORDER — PANTOPRAZOLE SODIUM 40 MG PO TBEC: 40.0000 mg | DELAYED_RELEASE_TABLET | Freq: Every day | ORAL | 2 refills | Status: DC

## 2019-02-12 NOTE — Patient Instructions (Addendum)
If you are age 31 or older, your body mass index should be between 23-30. Your Body mass index is 40.49 kg/m. If this is out of the aforementioned range listed, please consider follow up with your Primary Care Provider.  If you are age 80 or younger, your body mass index should be between 19-25. Your Body mass index is 40.49 kg/m. If this is out of the aformentioned range listed, please consider follow up with your Primary Care Provider.   You have been scheduled for an endoscopy. Please follow written instructions given to you at your visit today. If you use inhalers (even only as needed), please bring them with you on the day of your procedure.  We have sent the following medications to your pharmacy for you to pick up at your convenience:  START: famotidine 20 mg one tablet every morning.  Take pantoprazole 20-30 mintues prior to dinner meal.  Please take into consideration that reducing caffiene intake, decreasing cigarette smoking, and loosing weight will help to reduce GERD.  Thank you, Dr Christella Hartigan

## 2019-02-12 NOTE — Progress Notes (Signed)
HPI: This is a pleasant 29 year old woman whom I last saw her November 2019 for morbid obesity, chronic constipation, chronic GERD and GERD related nausea.  Her GERD symptoms were well controlled on H2 blocker taken twice a day.  I thought she should continue that.  I recommend she try to lose weight.  I recommended she cut back on her caffeine intake.  I recommended she try fiber supplements and try to stay hydrated.  I have recommended that she return to see me about 2 months afterwards however we have not heard from her since.  She never tried fiber supplements.  Check she does not recall being in the office before.  Since that visit she tells me her heartburn has been worse.  She is having intermittent solid food dysphagia.  She is bothered by nausea and asked that he belches throughout the day.  Her weight has been up and down.  She still drinks quite a lot of caffeine with 40 ounces of Dr. Malachi Bonds daily and says several days a week she will have a coffee in the morning as well.  She has tried [employer], she has tried Pepcid she has tried Prilosec without much improvement.  Currently she is taking her 40 mg Protonix pill first thing in the morning and she does not generally eat anything until several hours later.  Sometimes she will take a second pill around dinner.  She does not drink much alcohol   Looks like she has lost about 18 pounds since that visit in 2019.  Same scale here in our office.  Blood work November 2020 shows hemoglobin 11.4, normal complete metabolic profile  Blood work December 2020 shows normal sed rate, negative ANA, negative rheumatoid factor.    ROS: complete GI ROS as described in HPI, all other review negative.  Constitutional:  No unintentional weight loss   Past Medical History:  Diagnosis Date  . Diabetes mellitus type 1 ([place])    Follows with Endo  . Tobacco abuse   . Wears glasses     Past Surgical History:  Procedure Laterality Date  .  CESAREAN SECTION N/A 02/01/2016   Procedure: CESAREAN SECTION;  Surgeon: Mora Bellman, MD;  Location: [facility];  Service: Obstetrics;  Laterality: N/A;  . FOOT SURGERY Left     Current Outpatient Medications  Medication Sig Dispense Refill  . buPROPion (WELLBUTRIN XL) 150 MG 24 hr tablet Take 1 tablet (150 mg total) by mouth daily. 30 tablet 3  . gabapentin (NEURONTIN) 300 MG capsule Take by mouth.    Marland Kitchen ibuprofen (ADVIL,MOTRIN) 800 MG tablet Take 1 tablet (800 mg total) by mouth every 8 (eight) hours as needed. 30 tablet 2  . insulin aspart (NOVOLOG) 100 UNIT/ML injection Use as directed in insulin pump. MDD = 125 units. DX. E10.65    . Insulin Infusion Pump (MINIMED 630G INSULIN PUMP) KIT by Does not apply route.    . labetalol (NORMODYNE) 100 MG tablet Take 1 tablet by mouth once daily 30 tablet 0  . pantoprazole (PROTONIX) 40 MG tablet Take 1 tablet (40 mg total) by mouth daily. 30 tablet 2   No current facility-administered medications for this visit.    Allergies as of 02/12/2019  . (No Known Allergies)    Family History  Problem Relation Age of Onset  . Hypertension Mother   . Bipolar disorder Sister   . Lupus Brother   . Heart disease Maternal Uncle   . ADD / ADHD Brother  Social History   Socioeconomic History  . Marital status: Significant Other    Spouse name: Not on file  . Number of children: 1  . Years of education: Not on file  . Highest education level: Not on file  Occupational History  . 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 Smoking status: Current Every Day Smoker    Packs/day: 0.50    Years: 12.00    Pack years: 6.00    Types: Cigarettes  . Smokeless tobacco: Never Used  Substance and Sexual Activity  . Alcohol use: No  . Drug use: No  . Sexual activity: Not on file  Other Topics Concern  . Not on file  Social History Narrative  . Not on file   Social Determinants of Health   Financial Resource Strain:   . Difficulty of  Paying Living Expenses: Not on file  Food Insecurity:   . Worried About Charity fundraiser in the Last Year: Not on file  . Ran Out of Food in the Last Year: Not on file  Transportation Needs:   . Lack of Transportation (Medical): Not on file  . Lack of Transportation (Non-Medical): Not on file  Physical Activity:   . Days of Exercise per Week: Not on file  . Minutes of Exercise per Session: Not on file  Stress:   . Feeling of Stress : Not on file  Social Connections:   . Frequency of Communication with Friends and Family: Not on file  . Frequency of Social Gatherings with Friends and Family: Not on file  . Attends Religious Services: Not on file  . Active Member of Clubs or Organizations: Not on file  . Attends Archivist Meetings: Not on file  . Marital Status: Not on file  Intimate Partner Violence:   . Fear of Current or Ex-Partner: Not on file  . Emotionally Abused: Not on file  . Physically Abused: Not on file  . Sexually Abused: Not on file     Physical Exam: Temp 98.5 F (36.9 C)   Ht 5' 8.5" (1.74 m)   Wt 270 lb 4 oz (122.6 kg)   BMI 40.49 kg/m  Constitutional: generally well-appearing Psychiatric: alert and oriented x3 Abdomen: soft, nontender, nondistended, no obvious ascites, no peritoneal signs, normal bowel sounds No peripheral edema noted in lower extremities  Assessment and plan: 29 y.o. female with obesity, GERD, dysphagia  First I do think she is having some issues with acid reflux.  She is not taking proton pump inhibitor the correct time in relation to meals and so she is going to start taking her Protonix 20 to 30 minutes before her dinner meal since she never really eats breakfast.  In addition she is going to restart famotidine 20 mg pills she will take 1 pill every morning right after she wakes up.  We will write her a new prescription for that.  She does have some dysphagia and I recommended EGD at her soonest convenience to exclude  neoplasm which I think is unlikely.  I again educated her on the fact that cutting back on caffeine, stopping cigarette smoking and losing weight would all probably help her acid reflux.  Please see the "Patient Instructions" section for addition details about the plan.  Owens Loffler, MD [facility] 02/12/2019, 9:11 AM   Total time on date of encounter was 30 minutes  (this included time spent preparing to see the patient reviewing records; obtaining and/or reviewing separately obtained history;  performing a medically appropriate exam and/or evaluation; counseling and educating the patient and family if present; ordering medications, tests or procedures if applicable; and documenting clinical information in the health record).

## 2019-02-12 NOTE — Telephone Encounter (Signed)
Molly Savage February 09, 1990 [id number]   Dear Dr. Katrinka Blazing    Dr. Christella Hartigan has scheduled the above patient for a(n) Endoscopy at [facility] on 02/24/19.  Our records show that he/she is on insulin therapy via an insulin pump.  Our endooscopy prep protocol requires that:  the patient must be on a clear liquid diet the entire day of the procedure  the patient must be NPO for 3 to 4 hours prior to the procedure    Please advise [place] of any adjustments that need to be made to the patient's insulin pump therapy prior to the above procedure date.    Please route or fax back this completed form to me at [phone] .  If you have any questions, please call me at [phone].  Thank you for your help with this matter.  Sincerely,  Thompson Grayer, CMA    Physician Recommendation:  ________________________________________________  ________________________________________________________________________  ________________________________________________________________________  ________________________________________________________________________

## 2019-02-16 ENCOUNTER — Telehealth: Payer: Self-pay | Admitting: Gastroenterology

## 2019-02-16 ENCOUNTER — Other Ambulatory Visit: Payer: Self-pay | Admitting: Medical

## 2019-02-16 NOTE — Telephone Encounter (Signed)
Spoke with patient and discussed that if paper work was mailed to her home, it may not get to her in time for her procedure.  Patient agreed to pick up paper work on 02/22/19 when she comes into [place] of Covid testing. Patient agreed to plan and verbalized understanding.  No further questions.

## 2019-02-16 NOTE — Telephone Encounter (Signed)
Patient advised that recommendations for insulin pump therapy prior endoscopy has been faxed to our office by Dr Michaelle Copas office. Patient aware of instructions. I offered to mail copy of instructions to the patient's home, but she would rather pick up in person. Copy of instructions left at front desk for patient to pick up. Patient agreed to plan and verbalized understanding. No further questions.

## 2019-02-22 ENCOUNTER — Other Ambulatory Visit: Payer: Self-pay | Admitting: Gastroenterology

## 2019-02-22 ENCOUNTER — Other Ambulatory Visit: Payer: Self-pay

## 2019-02-22 ENCOUNTER — Ambulatory Visit (INDEPENDENT_AMBULATORY_CARE_PROVIDER_SITE_OTHER): Payer: Self-pay

## 2019-02-22 DIAGNOSIS — Z1159 Encounter for screening for other viral diseases: Secondary | ICD-10-CM

## 2019-02-23 ENCOUNTER — Other Ambulatory Visit: Payer: Self-pay | Admitting: Family Medicine

## 2019-02-23 MED ORDER — LABETALOL HCL 100 MG PO TABS: ORAL_TABLET | ORAL | 0 refills | Status: DC

## 2019-02-24 ENCOUNTER — Encounter: Payer: Self-pay | Admitting: Gastroenterology

## 2019-02-24 ENCOUNTER — Ambulatory Visit (AMBULATORY_SURGERY_CENTER): Payer: Medicaid Other | Admitting: Gastroenterology

## 2019-02-24 ENCOUNTER — Other Ambulatory Visit: Payer: Self-pay

## 2019-02-24 VITALS — BP 119/74 | HR 76 | Temp 96.8°F | Resp 19 | Ht 68.0 in | Wt 270.0 lb

## 2019-02-24 DIAGNOSIS — K2970 Gastritis, unspecified, without bleeding: Secondary | ICD-10-CM | POA: Diagnosis not present

## 2019-02-24 DIAGNOSIS — K219 Gastro-esophageal reflux disease without esophagitis: Secondary | ICD-10-CM | POA: Diagnosis not present

## 2019-02-24 DIAGNOSIS — K2950 Unspecified chronic gastritis without bleeding: Secondary | ICD-10-CM

## 2019-02-24 MED ORDER — SODIUM CHLORIDE 0.9 % IV SOLN: 500.0000 mL | Freq: Once | INTRAVENOUS | Status: DC

## 2019-02-24 NOTE — Op Note (Signed)
[facility] Patient Name: Molly Savage Procedure Date: 02/24/2019 11:16 AM MRN: [id number] Endoscopist: Rachael Fee , MD Age: 29 Referring MD:  Date of Birth: 05-01-1990 Gender: Female Account #: [id number] Procedure:                Upper GI endoscopy Indications:              GERD Medicines:                Monitored Anesthesia Care Procedure:                Pre-Anesthesia Assessment:                           - Prior to the procedure, a History and Physical                            was performed, and patient medications and                            allergies were reviewed. The patient's tolerance of                            previous anesthesia was also reviewed. The risks                            and benefits of the procedure and the sedation                            options and risks were discussed with the patient.                            All questions were answered, and informed consent                            was obtained. Prior Anticoagulants: The patient has                            taken no previous anticoagulant or antiplatelet                            agents. ASA Grade Assessment: II - A patient with                            mild systemic disease. After reviewing the risks                            and benefits, the patient was deemed in                            satisfactory condition to undergo the procedure.                           After obtaining informed consent, the endoscope was  passed under direct vision. Throughout the                            procedure, the patient's blood pressure, pulse, and                            oxygen saturations were monitored continuously. The                            Endoscope was introduced through the mouth, and                            advanced to the second part of duodenum. The upper                            GI endoscopy was accomplished without  difficulty.                            The patient tolerated the procedure well. Scope In: Scope Out: Findings:                 Mild inflammation characterized by erythema and                            granularity was found in the gastric antrum.                            Biopsies were taken with a cold forceps for                            histology.                           The exam was otherwise without abnormality. Complications:            No immediate complications. Estimated blood loss:                            None. Estimated Blood Loss:     Estimated blood loss: none. Impression:               - Mild gastritis, biopsied to check for H. pylori.                           - The examination was otherwise normal. Recommendation:           - Patient has a contact number available for                            emergencies. The signs and symptoms of potential                            delayed complications were discussed with the                            patient. Return to normal activities tomorrow.  Written discharge instructions were provided to the                            patient.                           - Resume previous diet. Cutting back on caffeine,                            stopping smoking and losing weight may help your GI                            issues.                           - Continue present medications.                           - Await pathology results. If biopsies show H.                            pylori infection you will be started on appropriate                            antibiotics. [name], MD 02/24/2019 11:39:40 AM This report has been signed electronically.

## 2019-02-24 NOTE — Progress Notes (Signed)
Pt tolerated well. VSS. Awake and to recovery. 

## 2019-02-24 NOTE — Patient Instructions (Signed)
YOU HAD AN ENDOSCOPIC PROCEDURE TODAY AT THE [facility]:   Refer to the procedure report that was given to you for any specific questions about what was found during the examination.  If the procedure report does not answer your questions, please call your gastroenterologist to clarify.  If you requested that your care partner not be given the details of your procedure findings, then the procedure report has been included in a sealed envelope for you to review at your convenience later.  YOU SHOULD EXPECT: Some feelings of bloating in the abdomen. Passage of more gas than usual.  Walking can help get rid of the air that was put into your GI tract during the procedure and reduce the bloating. If you had a lower endoscopy (such as a colonoscopy or flexible sigmoidoscopy) you may notice spotting of blood in your stool or on the toilet paper. If you underwent a bowel prep for your procedure, you may not have a normal bowel movement for a few days.  Please Note:  You might notice some irritation and congestion in your nose or some drainage.  This is from the oxygen used during your procedure.  There is no need for concern and it should clear up in a day or so.  SYMPTOMS TO REPORT IMMEDIATELY:    Following upper endoscopy (EGD)  Vomiting of blood or coffee ground material  New chest pain or pain under the shoulder blades  Painful or persistently difficult swallowing  New shortness of breath  Fever of 100F or higher  Black, tarry-looking stools  For urgent or emergent issues, a gastroenterologist can be reached at any hour by calling [phone].   DIET:  We do recommend a small meal at first, but then you may proceed to your regular diet.  Drink plenty of fluids but you should avoid alcoholic beverages for 24 hours.  ACTIVITY:  You should plan to take it easy for the rest of today and you should NOT DRIVE or use heavy machinery until tomorrow (because of the sedation medicines used  during the test).    FOLLOW UP: Our staff will call the number listed on your records 48-72 hours following your procedure to check on you and address any questions or concerns that you may have regarding the information given to you following your procedure. If we do not reach you, we will leave a message.  We will attempt to reach you two times.  During this call, we will ask if you have developed any symptoms of COVID 19. If you develop any symptoms (ie: fever, flu-like symptoms, shortness of breath, cough etc.) before then, please call [phone].  If you test positive for Covid 19 in the 2 weeks post procedure, please call and report this information to [place].    If any biopsies were taken you will be contacted by phone or by letter within the next 1-3 weeks.  Please call us at [phone] if you have not heard about the biopsies in 3 weeks.    SIGNATURES/CONFIDENTIALITY: You and/or your care partner have signed paperwork which will be entered into your electronic medical record.  These signatures attest to the fact that that the information above on your After Visit Summary has been reviewed and is understood.  Full responsibility of the confidentiality of this discharge information lies with you and/or your care-partner.  Resume medications. Information given on gastritis.

## 2019-02-24 NOTE — Progress Notes (Signed)
Called to room to assist during endoscopic procedure.  Patient ID and intended procedure confirmed with present staff. Received instructions for my participation in the procedure from the performing physician.  

## 2019-02-24 NOTE — Progress Notes (Signed)
VS- Molly Savage  Temperature- [name] Clapps  Insulin pump set at 80 percent

## 2019-02-26 ENCOUNTER — Telehealth: Payer: Self-pay

## 2019-02-26 NOTE — Telephone Encounter (Signed)
Second post procedure follow up call, no answer 

## 2019-02-26 NOTE — Telephone Encounter (Signed)
  Follow up Call-  Call back number 02/24/2019  Post procedure Call Back phone  # [phone]  Permission to leave phone message Yes  Some recent data might be hidden     Left message

## 2019-03-01 ENCOUNTER — Encounter: Payer: Self-pay | Admitting: Gastroenterology

## 2019-03-27 ENCOUNTER — Telehealth (INDEPENDENT_AMBULATORY_CARE_PROVIDER_SITE_OTHER): Payer: Medicaid Other | Admitting: Internal Medicine

## 2019-03-27 ENCOUNTER — Other Ambulatory Visit: Payer: Self-pay

## 2019-03-27 ENCOUNTER — Encounter: Payer: Self-pay | Admitting: Internal Medicine

## 2019-03-27 DIAGNOSIS — K219 Gastro-esophageal reflux disease without esophagitis: Secondary | ICD-10-CM | POA: Diagnosis not present

## 2019-03-27 DIAGNOSIS — E10319 Type 1 diabetes mellitus with unspecified diabetic retinopathy without macular edema: Secondary | ICD-10-CM

## 2019-03-27 DIAGNOSIS — Z8379 Family history of other diseases of the digestive system: Secondary | ICD-10-CM | POA: Diagnosis not present

## 2019-03-27 DIAGNOSIS — R1010 Upper abdominal pain, unspecified: Secondary | ICD-10-CM

## 2019-03-27 NOTE — Progress Notes (Signed)
Virtual Visit via Video Note  I connected with@ on 03/27/19 at 12:30 PM EST by a video enabled telemedicine application and verified that I am speaking with the correct person using two identifiers. Location patient: home Location provider:work  office Persons participating in the virtual visit: patient, provider  [place] national recommendations  regarding COVID 19 pandemic   video visit is advised over in office visit for this patient.  Patient aware  of the limitations of evaluation and management by telemedicine and  availability of in person appointments. and agreed to proceed.   HPI: Molly Savage presents for video visit acute today for [facility] Saturday clinic for  new problem evaluation. 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   Trying for pregnancy  Period due next week.   fam hx bro and mom had GB removed  ROS: See pertinent positives and negatives per HPI. No fever hematuria hx renal stones   Past Medical History:  Diagnosis Date  . Anxiety   . Depression   . Diabetes mellitus type 1 ([place])    Follows with Endo  . GERD (gastroesophageal reflux disease)   . Tobacco abuse   . Wears glasses     Past Surgical History:  Procedure Laterality Date  . CESAREAN SECTION N/A 02/01/2016   Procedure: CESAREAN SECTION;  Surgeon: Mora Bellman, MD;  Location: [facility];  Service: Obstetrics;  Laterality: N/A;  . FOOT SURGERY Left     Family History  Problem Relation Age of Onset  . Hypertension Mother   . Bipolar disorder Sister   . Lupus Brother   . Heart disease Maternal Uncle    . ADD / ADHD Brother   . Colon cancer Neg Hx   . Esophageal cancer Neg Hx   . Rectal cancer Neg Hx   . Stomach cancer Neg Hx     Social History   Tobacco Use  . Smoking status: Current Every Day Smoker    Packs/day: 0.50    Years: 12.00    Pack years: 6.00    Types: Cigarettes  . Smokeless tobacco: Never Used  Substance Use Topics  . Alcohol use: No  . Drug use: No      Current Outpatient Medications:  .  buPROPion (WELLBUTRIN XL) 150 MG 24 hr tablet, Take 1 tablet (150 mg total) by mouth daily., Disp: 30 tablet, Rfl: 3 .  famotidine (PEPCID) 20 MG tablet, Take 1 tablet (20 mg total) by mouth every morning., Disp: 30 tablet, Rfl: 11 .  gabapentin (NEURONTIN) 300 MG capsule, Take by mouth., Disp: , Rfl:  .  ibuprofen (ADVIL,MOTRIN) 800 MG tablet, Take 1 tablet (800 mg total) by mouth every 8 (eight) hours as needed., Disp: 30 tablet, Rfl: 2 .  insulin aspart (NOVOLOG) 100 UNIT/ML injection, Use as directed in insulin pump. MDD = 125 units. DX. E10.65, Disp: , Rfl:  .  Insulin Infusion Pump (MINIMED 630G INSULIN PUMP) KIT, by Does not apply route., Disp: ,  Rfl:  .  labetalol (NORMODYNE) 100 MG tablet, Take 1 tablet by mouth once daily, Disp: 30 tablet, Rfl: 0 .  pantoprazole (PROTONIX) 40 MG tablet, Take 1 tablet (40 mg total) by mouth daily. Take 2- 30 minutes prior to dinner meal., Disp: 30 tablet, Rfl: 2  EXAM: BP Readings from Last 3 Encounters:  02/24/19 119/74  02/12/19 130/80  12/24/18 131/71   Wt Readings from Last 3 Encounters:  02/24/19 270 lb (122.5 kg)  02/12/19 270 lb 4 oz (122.6 kg)  12/24/18 269 lb 6.4 oz (122.2 kg)     VITALS per patient if applicable:  GENERAL: alert, oriented, appears well and in no acute distress driving in car   Non toxic nl color   HEENT: atraumatic, conjunttiva clear, no obvious abnormalities on inspection of external nose and ears  NECK: normal movements of the head and neck  LUNGS: on inspection no signs of respiratory  distress, breathing rate appears normal, no obvious gross SOB, gasping or wheezing  CV: no obvious cyanosis Points to upper abd are of discomfort  MS: moves all visible extremities without noticeable abnormality  PSYCH/NEURO: pleasant and cooperative, no obvious depression or anxiety, speech and thought processing grossly intact Lab Results  Component Value Date   WBC 6.6 12/10/2018   HGB 11.4 (L) 12/10/2018   HCT 35.5 (L) 12/10/2018   PLT 242.0 12/10/2018   GLUCOSE 57 (L) 12/10/2018   ALT 14 12/10/2018   AST 12 12/10/2018   NA 140 12/10/2018   K 4.1 12/10/2018   CL 107 12/10/2018   CREATININE 0.66 12/10/2018   BUN 13 12/10/2018   CO2 26 12/10/2018   TSH 1.94 12/10/2018   INR 1.11 04/16/2015   HGBA1C 7.8 (H) 06/18/2017    ASSESSMENT AND PLAN:  Discussed the following assessment and plan:    ICD-10-CM   1. Pain of upper abdomen  R10.10 US Abdomen Complete  2. Gastroesophageal reflux disease, unspecified whether esophagitis present  K21.9   3. Family history of gallbladder disease  Z83.79 US Abdomen Complete  4. Type 1 diabetes mellitus with retinopathy, macular edema presence unspecified, unspecified laterality, unspecified retinopathy severity ([place])  E10.319   5. Morbid obesity ([facility])  E66.01    Underlying  gerd and gastritis  By endo last fall onrx  This however sounds like biliary pain with acuity and context, family  hx and underlying conditions Does not appear to be cardiovascular cause  in nature  Menses due next week can check testing ( trying to get pregnant)   Eat light no fried fatty foods   Take your pepcid twice day  If fever severe pain worsening  Seek ED care .  Check bg  As indicated  Counseled.   Expectant management and discussion of plan and treatment with opportunity to ask questions and all were answered. The patient agreed with the plan and demonstrated an understanding of the instructions.   Advised to call back or seek an in-person evaluation if  worsening  or having  further concerns . Return primary care contact next week  or ed if worse.    Shanon Ace, MD

## 2019-04-02 ENCOUNTER — Other Ambulatory Visit: Payer: Self-pay | Admitting: Family Medicine

## 2019-04-02 DIAGNOSIS — F4323 Adjustment disorder with mixed anxiety and depressed mood: Secondary | ICD-10-CM

## 2019-04-07 ENCOUNTER — Telehealth: Payer: Self-pay | Admitting: Internal Medicine

## 2019-04-07 NOTE — Telephone Encounter (Signed)
Can you help with this?

## 2019-04-07 NOTE — Telephone Encounter (Signed)
Kylie from [facility] in [place] is needing a PA in order for the pt to be seen tomorrow at 9:00 am. Pt has medicaid and they require a PA in order for them to cover it. Once this has been completed contact Kylie at [phone] ext [phone]

## 2019-04-08 ENCOUNTER — Other Ambulatory Visit: Payer: Self-pay

## 2019-04-08 ENCOUNTER — Ambulatory Visit (HOSPITAL_BASED_OUTPATIENT_CLINIC_OR_DEPARTMENT_OTHER)
Admission: RE | Admit: 2019-04-08 | Discharge: 2019-04-08 | Disposition: A | Discharge status: Home / Self Care | Payer: Medicaid Other | Source: Ambulatory Visit | Attending: Internal Medicine | Admitting: Internal Medicine

## 2019-04-08 DIAGNOSIS — Z8379 Family history of other diseases of the digestive system: Secondary | ICD-10-CM | POA: Diagnosis present

## 2019-04-08 DIAGNOSIS — R1010 Upper abdominal pain, unspecified: Secondary | ICD-10-CM | POA: Diagnosis present

## 2019-04-09 NOTE — Telephone Encounter (Signed)
This has been completed an pt was advised and pre service center    Service [phone] Authorization [phone] Sent to [facility]

## 2019-04-09 NOTE — Progress Notes (Signed)
Gallsstones are present and probable cause of your symptoms  pain and vomiting  Please make a FU visit in person of virtual with your PCP Dr Carmelia Roller  to discuss next step .

## 2019-04-12 ENCOUNTER — Ambulatory Visit (INDEPENDENT_AMBULATORY_CARE_PROVIDER_SITE_OTHER): Payer: Medicaid Other | Admitting: Family Medicine

## 2019-04-12 ENCOUNTER — Other Ambulatory Visit: Payer: Self-pay

## 2019-04-12 ENCOUNTER — Encounter: Payer: Self-pay | Admitting: Family Medicine

## 2019-04-12 DIAGNOSIS — K8020 Calculus of gallbladder without cholecystitis without obstruction: Secondary | ICD-10-CM

## 2019-04-12 DIAGNOSIS — K8050 Calculus of bile duct without cholangitis or cholecystitis without obstruction: Secondary | ICD-10-CM | POA: Diagnosis not present

## 2019-04-12 NOTE — Progress Notes (Signed)
Chief Complaint  Patient presents with  . Follow-up    Gall Stones found on recent US    Subjective: Patient is a 29 y.o. female here for gallstones. Due to COVID-19 pandemic, we are interacting via web portal for an electronic face-to-face visit. I verified patient's ID using 2 identifiers. Patient agreed to proceed with visit via this method. Patient is at home, I am at office. Patient and I are present for visit.   Patient was seen by Dr. Fabian Sharp on 03/27/2019 for upper abdominal pain.  An abdominal ultrasound was obtained that showed a gallbladder full of stones.  There is no signs of cholecystitis.  Her upper abdominal pain continues, particularly worse after eating and even more severe when she has greasy foods.  No nausea or vomiting.  No bowel changes.  Past Medical History:  Diagnosis Date  . Anxiety   . Depression   . Diabetes mellitus type 1 (HCC)    Follows with Endo  . GERD (gastroesophageal reflux disease)   . Tobacco abuse   . Wears glasses     Objective: No conversational dyspnea Age appropriate judgment and insight Nml affect and mood  Assessment and Plan: Gallstones - Plan: Ambulatory referral to General Surgery  Biliary colic - Plan: Ambulatory referral to General Surgery  Refer to the general surgery team.  Sounds like she has symptomatic gallstones. Follow-up as needed for this. The patient voiced understanding and agreement to the plan.  Jilda Roche [place], DO 04/12/19  4:21 PM

## 2019-04-12 NOTE — Progress Notes (Addendum)
[facility] Note  04/13/2019     CHIEF COMPLAINT Patient presents for Retina Evaluation   HISTORY OF PRESENT ILLNESS: Molly Savage is a 29 y.o. female who presents to the clinic today for:   HPI    Retina Evaluation    In both eyes.  This started 2 weeks ago.  Duration of 2 weeks.  Associated Symptoms Floaters and Flashes.  Context:  distance vision and near vision.  I, the attending physician,  performed the HPI with the patient and updated documentation appropriately.          Comments    T1 DM Last BS: 123 Last HgA1c: 6.3 Pt states her vision is getting worse and has been wearing glasses that are a few years old because patient cannot afford new glasses.  Pt denies eye pain or discomfort.  She complains of lines in both eyes and floaters on occasion.  Pt states that endocrinologist has been happy with her numbers from labs.  Pt states as a child, she was asked to wear a patch OS, to strengthen OD but states this was because of diabetic damage in right eye.       Last edited by Bernarda Caffey, MD on 04/13/2019  4:41 PM. (History)    pt is here on the referral of Dr. Frederico Hamman, she states she saw him for a routine eye exam, she states she is not having problems with her vision unless her blood sugar drops or is too high, she states her right eye has always been weaker than her left, but she did not patch it as a child, she states she did not see an eye dr for the first time until she was 29 years old, pt is type 1 diabetic, on insulin  Referring physician: Gevena Cotton, MD [address] [place],  [place] [address]  HISTORICAL INFORMATION:   Selected notes from the MEDICAL RECORD NUMBER Referral from Dr. Frederico Hamman on 03/26/19 for eval of diabetic mac edema Wearing Rx: OD: +2.75+0.25x038  OS: -0.50 sph MRx: OD: +3.00 sph 20/60-2  OS: -0.25 sph 20/30 Amblyopic OD IOP 17 OU    CURRENT MEDICATIONS: No current outpatient medications  on file. (Ophthalmic Drugs)   No current facility-administered medications for this visit. (Ophthalmic Drugs)   Current Outpatient Medications (Other)  Medication Sig  . buPROPion (WELLBUTRIN XL) 150 MG 24 hr tablet Take 1 tablet by mouth once daily  . famotidine (PEPCID) 20 MG tablet Take 1 tablet (20 mg total) by mouth every morning.  . gabapentin (NEURONTIN) 300 MG capsule Take by mouth.  Marland Kitchen ibuprofen (ADVIL,MOTRIN) 800 MG tablet Take 1 tablet (800 mg total) by mouth every 8 (eight) hours as needed.  . insulin aspart (NOVOLOG) 100 UNIT/ML injection Use as directed in insulin pump. MDD = 125 units. DX. E10.65  . Insulin Infusion Pump (MINIMED 630G INSULIN PUMP) KIT by Does not apply route.  . labetalol (NORMODYNE) 100 MG tablet Take 1 tablet by mouth once daily  . pantoprazole (PROTONIX) 40 MG tablet Take 1 tablet (40 mg total) by mouth daily. Take 2- 30 minutes prior to dinner meal.   No current facility-administered medications for this visit. (Other)      REVIEW OF SYSTEMS: ROS    Positive for: Endocrine, Eyes   Negative for: Constitutional, Gastrointestinal, Neurological, Skin, Genitourinary, Musculoskeletal, HENT, Cardiovascular, Respiratory, Psychiatric, Allergic/Imm, Heme/Lymph   Last edited by Doneen Poisson on 04/13/2019 12:51 PM. (History)  ALLERGIES No Known Allergies  PAST MEDICAL HISTORY Past Medical History:  Diagnosis Date  . Anxiety   . Depression   . Diabetes mellitus type 1 ([place])    Follows with Endo  . GERD (gastroesophageal reflux disease)   . Tobacco abuse   . Wears glasses    Past Surgical History:  Procedure Laterality Date  . CESAREAN SECTION N/A 02/01/2016   Procedure: CESAREAN SECTION;  Surgeon: Mora Bellman, MD;  Location: [facility];  Service: Obstetrics;  Laterality: N/A;  . FOOT SURGERY Left     FAMILY HISTORY Family History  Problem Relation Age of Onset  . Hypertension Mother   . Bipolar disorder Sister   . Lupus  Brother   . Heart disease Maternal Uncle   . ADD / ADHD Brother   . Colon cancer Neg Hx   . Esophageal cancer Neg Hx   . Rectal cancer Neg Hx   . Stomach cancer Neg Hx     SOCIAL HISTORY Social History   Tobacco Use  . Smoking status: Current Every Day Smoker    Packs/day: 0.50    Years: 12.00    Pack years: 6.00    Types: Cigarettes  . Smokeless tobacco: Never Used  Substance Use Topics  . Alcohol use: No  . Drug use: No         OPHTHALMIC EXAM:  Base Eye Exam    Visual Acuity (Snellen - Linear)      Right Left   Dist cc 20/80 +1 20/30 +1   Dist ph cc NI 20/25 -1   Correction: Glasses       Tonometry (Tonopen, 1:11 PM)      Right Left   Pressure 20 18       Pupils      Dark Light Shape React APD   Right 4 3 Round Brisk 0   Left 4 3 Round Brisk 0       Visual Fields      Left Right    Full Full       Extraocular Movement      Right Left    Full Full       Neuro/Psych    Oriented x3: Yes   Mood/Affect: Normal       Dilation    Both eyes: 1.0% Mydriacyl, 2.5% Phenylephrine @ 1:11 PM        Slit Lamp and Fundus Exam    Slit Lamp Exam      Right Left   Lids/Lashes Mild Dermatochalasis - upper lid, mild Meibomian gland dysfunction Mild Dermatochalasis - upper lid, mild Meibomian gland dysfunction   Conjunctiva/Sclera White and quiet White and quiet   Cornea Trace Punctate epithelial erosions, Debris in tear film Trace Punctate epithelial erosions, Debris in tear film   Anterior Chamber Deep and quiet Deep and quiet   Iris Round and dilated, No NVI Round and dilated, No NVI   Lens Clear Clear   Vitreous Trace Vitreous syneresis Trace Vitreous syneresis       Fundus Exam      Right Left   Disc Pink and Sharp mild Pallor, Sharp rim   C/D Ratio 0.1 0.3   Macula Flat, Blunted foveal reflex, mild Epiretinal membrane, scattered punctate Microaneurysms Flat, Blunted foveal reflex, scattered, punctate Microaneurysms, Retinal pigment epithelial  mottling   Vessels Mild Vascular attenuation, Tortuousity Mild Vascular attenuation, Tortuous, mild Copper wiring; focal NV nasal to disc   Periphery Attached, focal fibrosis IN to  disc, rare MA Attached, rare MA, focal scattered exudates; focal NV nasal to disc        Refraction    Wearing Rx      Sphere Cylinder Axis   Right +2.25 +0.25 038   Left -0.50 Sphere        Manifest Refraction      Sphere Cylinder Axis Dist VA   Right +1.00 +0.50 010 20/60-2   Left -1.00 +0.50 180 20/25          IMAGING AND PROCEDURES  Imaging and Procedures for _0 @  OCT, Retina - OU - Both Eyes       Right Eye Quality was good. Central Foveal Thickness: 263. Progression has no prior data. Findings include normal foveal contour, no IRF, no SRF, vitreomacular adhesion  (Focal inner retinal thinning nasal macula, irregular lamination).   Left Eye Quality was good. Progression has no prior data. Findings include normal foveal contour, no IRF, no SRF, vitreomacular adhesion  (Broad foveal depression, irregular lamination centrally; focal pre-retinal fibrosis IN to disc caught on widefield).   Notes *Images captured and stored on drive  Diagnosis / Impression:  No IRF/SRF OU -- No DME OU Irregular lamination OU Focal PRF OS  Clinical management:  See below  Abbreviations: NFP - Normal foveal profile. CME - cystoid macular edema. PED - pigment epithelial detachment. IRF - intraretinal fluid. SRF - subretinal fluid. EZ - ellipsoid zone. ERM - epiretinal membrane. ORA - outer retinal atrophy. ORT - outer retinal tubulation. SRHM - subretinal hyper-reflective material        Fluorescein Angiography Optos (Transit OS)       Right Eye   Progression has no prior data. Early phase findings include microaneurysm, vascular perfusion defect. Mid/Late phase findings include leakage, microaneurysm, vascular perfusion defect (No NV).   Left Eye   Progression has no prior data. Early phase  findings include vascular perfusion defect, microaneurysm, retinal neovascularization. Mid/Late phase findings include leakage, microaneurysm, retinal neovascularization, vascular perfusion defect (Focal NV nasal to disc).   Notes **Images stored on drive**  Impression: OD: moderate NPDR  OS: PDR with focal NV nasal to disc                  ASSESSMENT/PLAN:    ICD-10-CM   1. 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 Moderate nonproliferative diabetic retinopathy of right eye without macular edema associated with type 1 diabetes mellitus ([place])  E10.3391   3. Retinal edema  H35.81 OCT, Retina - OU - Both Eyes  4. Essential hypertension  I10   5. Hypertensive retinopathy of both eyes  H35.033 Fluorescein Angiography Optos (Transit OS)  6. Amblyopia of right eye  H53.001    1. Proliferative diabetic retinopathy w/o DME, OS  - The incidence, risk factors for progression, natural history and treatment options for diabetic retinopathy were discussed with patient.    - The need for close monitoring of blood glucose, blood pressure, and serum lipids, avoiding cigarette or any type of tobacco, and the need for long term follow up was also discussed with patient.  - exam shows focal, early NV nasal to disc; scattered MA  - FA today (03.23.21) shows leaking NV nasal to disc, scattered late leaking MA  - OCT without diabetic macular edema, both eyes   - discussed findings and prognosis  - recommend PRP OS in 2 weeks  - f/u in  2 wks for PRP OS  2,3. Moderate nonproliferative diabetic retinopathy w/o DME, OD  - The incidence, risk factors for progression, natural history and treatment options for diabetic retinopathy were discussed with patient.    - The need for close monitoring of blood glucose, blood pressure, and serum lipids, avoiding cigarette or any type of tobacco, and the need for long term  follow up was also discussed with patient.  - exam shows scattered MA; no NV  - FA (03.23.21) shows late leaking MA, no NV  - OCT without diabetic macular edema, OU  4,5. Hypertensive retinopathy OU  - discussed importance of tight BP control  - monitor  6. Amblyopia OD  - long standing  - stable  - monitor   Ophthalmic Meds Ordered this visit:  No orders of the defined types were placed in this encounter.      Return in about 2 weeks (around 04/27/2019) for f/u PDR OS, NPDR OD -- DFE, OCT, Laser PRP OS.  There are no Patient Instructions on file for this visit.   Explained the diagnoses, plan, and follow up with the patient and they expressed understanding.  Patient expressed understanding of the importance of proper follow up care.  This document serves as a record of services personally performed by Gardiner Sleeper, MD, PhD. It was created on their behalf by Estill Bakes, COT an ophthalmic technician. The creation of this record is the provider's dictation and/or activities during the visit.    Electronically signed by: Estill Bakes, COT 04/12/19 @ 4:57 PM   This document serves as a record of services personally performed by Gardiner Sleeper, MD, PhD. It was created on their behalf by Ernest Mallick, OA, an ophthalmic assistant. The creation of this record is the provider's dictation and/or activities during the visit.    Electronically signed by: Ernest Mallick, OA 03.23.2021 4:57 PM   Gardiner Sleeper, M.D., Ph.D. Diseases & Surgery of the Retina and Vitreous [facility]   I have reviewed the above documentation for accuracy and completeness, and I agree with the above. Gardiner Sleeper, M.D., Ph.D. 04/13/19 4:57 PM    Abbreviations: M myopia (nearsighted); A astigmatism; H hyperopia (farsighted); P presbyopia; Mrx spectacle prescription;  CTL contact lenses; OD right eye; OS left eye; OU both eyes  XT exotropia; ET esotropia; PEK punctate  epithelial keratitis; PEE punctate epithelial erosions; DES dry eye syndrome; MGD meibomian gland dysfunction; ATs artificial tears; PFAT's preservative free artificial tears; [place] nuclear sclerotic cataract; PSC posterior subcapsular cataract; ERM epi-retinal membrane; PVD posterior vitreous detachment; RD retinal detachment; DM diabetes mellitus; DR diabetic retinopathy; NPDR non-proliferative diabetic retinopathy; PDR proliferative diabetic retinopathy; CSME clinically significant macular edema; DME diabetic macular edema; dbh dot blot hemorrhages; CWS cotton wool spot; POAG primary open angle glaucoma; C/D cup-to-disc ratio; HVF humphrey visual field; GVF goldmann visual field; OCT optical coherence tomography; IOP intraocular pressure; BRVO Branch retinal vein occlusion; CRVO central retinal vein occlusion; CRAO central retinal artery occlusion; BRAO branch retinal artery occlusion; RT retinal tear; SB scleral buckle; PPV pars plana vitrectomy; VH Vitreous hemorrhage; PRP panretinal laser photocoagulation; IVK intravitreal kenalog; VMT vitreomacular traction; MH Macular hole;  NVD neovascularization of the disc; NVE neovascularization elsewhere; AREDS age related eye disease study; ARMD age related macular degeneration; POAG primary open angle glaucoma; EBMD epithelial/anterior basement membrane dystrophy; ACIOL anterior chamber intraocular lens; IOL intraocular lens; PCIOL posterior chamber intraocular lens; Phaco/IOL phacoemulsification with intraocular lens placement; PRK photorefractive  keratectomy; LASIK laser assisted in situ keratomileusis; HTN hypertension; DM diabetes mellitus; COPD chronic obstructive pulmonary disease

## 2019-04-13 ENCOUNTER — Encounter (INDEPENDENT_AMBULATORY_CARE_PROVIDER_SITE_OTHER): Payer: Self-pay | Admitting: Ophthalmology

## 2019-04-13 ENCOUNTER — Other Ambulatory Visit: Payer: Self-pay

## 2019-04-13 ENCOUNTER — Ambulatory Visit (INDEPENDENT_AMBULATORY_CARE_PROVIDER_SITE_OTHER): Payer: Medicaid Other | Admitting: Ophthalmology

## 2019-04-13 DIAGNOSIS — E103391 Type 1 diabetes mellitus with moderate nonproliferative diabetic retinopathy without macular edema, right eye: Secondary | ICD-10-CM | POA: Diagnosis not present

## 2019-04-13 DIAGNOSIS — H3581 Retinal edema: Secondary | ICD-10-CM | POA: Diagnosis not present

## 2019-04-13 DIAGNOSIS — H35033 Hypertensive retinopathy, bilateral: Secondary | ICD-10-CM

## 2019-04-13 DIAGNOSIS — H53001 Unspecified amblyopia, right eye: Secondary | ICD-10-CM

## 2019-04-13 DIAGNOSIS — E103592 Type 1 diabetes mellitus with proliferative diabetic retinopathy without macular edema, left eye: Secondary | ICD-10-CM | POA: Diagnosis not present

## 2019-04-13 DIAGNOSIS — I10 Essential (primary) hypertension: Secondary | ICD-10-CM | POA: Diagnosis not present

## 2019-05-06 ENCOUNTER — Other Ambulatory Visit: Payer: Self-pay | Admitting: Family Medicine

## 2019-05-06 DIAGNOSIS — F4323 Adjustment disorder with mixed anxiety and depressed mood: Secondary | ICD-10-CM

## 2019-05-10 ENCOUNTER — Ambulatory Visit: Payer: Self-pay | Admitting: General Surgery

## 2019-05-10 NOTE — Progress Notes (Signed)
[facility] Note  05/11/2019     CHIEF COMPLAINT Patient presents for Retina Follow Up   HISTORY OF PRESENT ILLNESS: Molly Savage is a 29 y.o. female who presents to the clinic today for:   HPI    Retina Follow Up    Patient presents with  Diabetic Retinopathy.  In left eye.  This started weeks ago.  Severity is moderate.  Duration of weeks.  Since onset it is stable.  I, the attending physician,  performed the HPI with the patient and updated documentation appropriately.          Comments    Pt states her vision is about the same OU.  Pt denies eye pain or discomfort and denies any new or worsening floaters or fol OU.       Last edited by Bernarda Caffey, MD on 05/11/2019  9:44 AM. (History)    pt is here for PRP OS today, she states she is ready for treatment, no change in vision since last exam  Referring physician: Shelda Pal, DO [address] [place] [address] [place],   [id number]  HISTORICAL INFORMATION:   Selected notes from the MEDICAL RECORD NUMBER Referral from Dr. Frederico Hamman on 03/26/19 for eval of diabetic mac edema    CURRENT MEDICATIONS: Current Outpatient Medications (Ophthalmic Drugs)  Medication Sig  . prednisoLONE acetate (PRED FORTE) 1 % ophthalmic suspension Place 1 drop into the left eye 4 (four) times daily for 7 days.   No current facility-administered medications for this visit. (Ophthalmic Drugs)   Current Outpatient Medications (Other)  Medication Sig  . buPROPion (WELLBUTRIN XL) 150 MG 24 hr tablet Take 1 tablet by mouth once daily  . famotidine (PEPCID) 20 MG tablet Take 1 tablet (20 mg total) by mouth every morning.  . gabapentin (NEURONTIN) 300 MG capsule Take by mouth.  Marland Kitchen ibuprofen (ADVIL,MOTRIN) 800 MG tablet Take 1 tablet (800 mg total) by mouth every 8 (eight) hours as needed.  . insulin aspart (NOVOLOG) 100 UNIT/ML injection Use as directed in insulin pump. MDD = 125 units. DX. E10.65  .  Insulin Infusion Pump (MINIMED 630G INSULIN PUMP) KIT by Does not apply route.  . labetalol (NORMODYNE) 100 MG tablet Take 1 tablet by mouth once daily  . pantoprazole (PROTONIX) 40 MG tablet Take 1 tablet (40 mg total) by mouth daily. Take 2- 30 minutes prior to dinner meal.   No current facility-administered medications for this visit. (Other)      REVIEW OF SYSTEMS: ROS    Positive for: Endocrine, Eyes   Negative for: Constitutional, Gastrointestinal, Neurological, Skin, Genitourinary, Musculoskeletal, HENT, Cardiovascular, Respiratory, Psychiatric, Allergic/Imm, Heme/Lymph   Last edited by Doneen Poisson on 05/11/2019  8:57 AM. (History)       ALLERGIES No Known Allergies  PAST MEDICAL HISTORY Past Medical History:  Diagnosis Date  . Anxiety   . Depression   . Diabetes mellitus type 1 ([place])    Follows with Endo  . GERD (gastroesophageal reflux disease)   . Tobacco abuse   . Wears glasses    Past Surgical History:  Procedure Laterality Date  . CESAREAN SECTION N/A 02/01/2016   Procedure: CESAREAN SECTION;  Surgeon: Mora Bellman, MD;  Location: [facility];  Service: Obstetrics;  Laterality: N/A;  . FOOT SURGERY Left     FAMILY HISTORY Family History  Problem Relation Age of Onset  . Hypertension Mother   . Bipolar disorder Sister   .  Lupus Brother   . Heart disease Maternal Uncle   . ADD / ADHD Brother   . Colon cancer Neg Hx   . Esophageal cancer Neg Hx   . Rectal cancer Neg Hx   . Stomach cancer Neg Hx     SOCIAL HISTORY Social History   Tobacco Use  . Smoking status: Current Every Day Smoker    Packs/day: 0.50    Years: 12.00    Pack years: 6.00    Types: Cigarettes  . Smokeless tobacco: Never Used  Substance Use Topics  . Alcohol use: No  . Drug use: No         OPHTHALMIC EXAM:  Base Eye Exam    Visual Acuity (Snellen - Linear)      Right Left   Dist cc 20/80 -2 20/30 +2   Dist ph cc 20/60 -2 NI   Correction: Glasses        Tonometry (Tonopen, 9:02 AM)      Right Left   Pressure 18 21       Pupils      Dark Light Shape React APD   Right 3 2 Round Brisk 0   Left 3 2 Round Brisk 0       Visual Fields      Left Right    Full Full       Extraocular Movement      Right Left    Full Full       Neuro/Psych    Oriented x3: Yes   Mood/Affect: Normal       Dilation    Left eye: 1.0% Mydriacyl, 2.5% Phenylephrine @ 9:04 AM        Slit Lamp and Fundus Exam    Slit Lamp Exam      Right Left   Lids/Lashes Mild Dermatochalasis - upper lid, mild Meibomian gland dysfunction Mild Dermatochalasis - upper lid, mild Meibomian gland dysfunction   Conjunctiva/Sclera White and quiet White and quiet   Cornea Trace Punctate epithelial erosions, Debris in tear film Trace Punctate epithelial erosions, Debris in tear film   Anterior Chamber Deep and quiet Deep and quiet   Iris Round and dilated, No NVI Round and dilated, No NVI   Lens Clear Clear   Vitreous Trace Vitreous syneresis Trace Vitreous syneresis       Fundus Exam      Right Left   Disc Pink and Sharp mild Pallor, Sharp rim   C/D Ratio 0.1 0.3   Macula Flat, Blunted foveal reflex, mild Epiretinal membrane, scattered punctate Microaneurysms Flat, Blunted foveal reflex, scattered, punctate Microaneurysms, Retinal pigment epithelial mottling   Vessels Mild Vascular attenuation, Tortuousity Mild Vascular attenuation, Tortuous, mild Copper wiring; focal NV nasal to disc   Periphery Attached, focal fibrosis IN to disc, rare MA Attached, rare MA, focal scattered exudates; focal NV nasal to disc        Refraction    Wearing Rx      Sphere Cylinder Axis   Right +2.25 +0.25 038   Left -0.50 Sphere           IMAGING AND PROCEDURES  Imaging and Procedures for _0 @  OCT, Retina - OU - Both Eyes       Right Eye Quality was good. Central Foveal Thickness: 265. Progression has been stable. Findings include normal foveal contour, no IRF, no SRF,  vitreomacular adhesion  (Focal inner retinal thinning nasal macula, irregular lamination).   Left Eye Quality was good. Central  Foveal Thickness: 260. Progression has been stable. Findings include normal foveal contour, no IRF, no SRF, vitreomacular adhesion  (Broad foveal depression, irregular lamination centrally; focal pre-retinal fibrosis IN to disc caught on widefield).   Notes *Images captured and stored on drive  Diagnosis / Impression:  No IRF/SRF OU -- No DME OU Irregular lamination OU Focal PRF OS Stable from prior  Clinical management:  See below  Abbreviations: NFP - Normal foveal profile. CME - cystoid macular edema. PED - pigment epithelial detachment. IRF - intraretinal fluid. SRF - subretinal fluid. EZ - ellipsoid zone. ERM - epiretinal membrane. ORA - outer retinal atrophy. ORT - outer retinal tubulation. SRHM - subretinal hyper-reflective material        Panretinal Photocoagulation - OS - Left Eye       LASER PROCEDURE NOTE  Diagnosis:   Proliferative Diabetic Retinopathy, LEFT EYE  Procedure:  Segmental pan-retinal photocoagulation using slit lamp laser, LEFT EYE  Anesthesia:  Topical  Surgeon: Bernarda Caffey, MD, PhD   Informed consent obtained, operative eye marked, and time out performed prior to initiation of laser.   Lumenis [id number] slit lamp laser Pattern: 3x3 square Power: 230 mW Duration: 30 msec  Spot size: 200 microns  # spots: 940 spots nasal hemisphere  Complications: None.  RTC: 6 wks  Patient tolerated the procedure well and received written and verbal post-procedure care information/education.                  ASSESSMENT/PLAN:    ICD-10-CM   1. 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 Moderate nonproliferative diabetic retinopathy of right eye without macular edema associated with type 1  diabetes mellitus ([place])  E10.3391   3. Retinal edema  H35.81 OCT, Retina - OU - Both Eyes  4. Essential hypertension  I10   5. Hypertensive retinopathy of both eyes  H35.033   6. Amblyopia of right eye  H53.001    1. Proliferative diabetic retinopathy w/o DME, OS  - exam shows focal, early NV nasal to disc; scattered MA  - OCT without diabetic macular edema, both eyes   - discussed findings and prognosis  - recommend segmental PRP (nasal hemisphere) OS today, 04.20.21  - pt wishes to proceed with laser  - RBA of procedure discussed, questions answered  - informed consent obtained and signed  - see procedure note  - start PF qid OS x7 days  - f/u 6 weeks, DFE/OCT  2,3. Moderate nonproliferative diabetic retinopathy w/o DME, OD  - The incidence, risk factors for progression, natural history and treatment options for diabetic retinopathy were discussed with patient.    - The need for close monitoring of blood glucose, blood pressure, and serum lipids, avoiding cigarette or any type of tobacco, and the need for long term follow up was also discussed with patient.  - exam shows scattered MA; no NV  - FA (03.23.21) shows late leaking MA, no NV  - OCT without diabetic macular edema, OU  4,5. Hypertensive retinopathy OU  - discussed importance of tight BP control  - monitor  6. Amblyopia OD  - long standing  - stable  - monitor   Ophthalmic Meds Ordered this visit:  Meds ordered this encounter  Medications  . prednisoLONE acetate (PRED FORTE) 1 % ophthalmic suspension    Sig: Place 1 drop into the left eye 4 (four) times daily  for 7 days.    Dispense:  10 mL    Refill:  0       Return in about 6 weeks (around 06/22/2019) for f/u PDR OS, DFE, OCT.  There are no Patient Instructions on file for this visit.   Explained the diagnoses, plan, and follow up with the patient and they expressed understanding.  Patient expressed understanding of the importance of proper follow up  care.  This document serves as a record of services personally performed by Gardiner Sleeper, MD, PhD. It was created on their behalf by Estill Bakes, COT an ophthalmic technician. The creation of this record is the provider's dictation and/or activities during the visit.    Electronically signed by: Estill Bakes, COT 05/10/19 @ 12:44 PM  Gardiner Sleeper, M.D., Ph.D. Diseases & Surgery of the Retina and [place] 05/11/2019   I have reviewed the above documentation for accuracy and completeness, and I agree with the above. Gardiner Sleeper, M.D., Ph.D. 05/11/19 12:44 PM    Abbreviations: M myopia (nearsighted); A astigmatism; H hyperopia (farsighted); P presbyopia; Mrx spectacle prescription;  CTL contact lenses; OD right eye; OS left eye; OU both eyes  XT exotropia; ET esotropia; PEK punctate epithelial keratitis; PEE punctate epithelial erosions; DES dry eye syndrome; MGD meibomian gland dysfunction; ATs artificial tears; PFAT's preservative free artificial tears; [place] nuclear sclerotic cataract; PSC posterior subcapsular cataract; ERM epi-retinal membrane; PVD posterior vitreous detachment; RD retinal detachment; DM diabetes mellitus; DR diabetic retinopathy; NPDR non-proliferative diabetic retinopathy; PDR proliferative diabetic retinopathy; CSME clinically significant macular edema; DME diabetic macular edema; dbh dot blot hemorrhages; CWS cotton wool spot; POAG primary open angle glaucoma; C/D cup-to-disc ratio; HVF humphrey visual field; GVF goldmann visual field; OCT optical coherence tomography; IOP intraocular pressure; BRVO Branch retinal vein occlusion; CRVO central retinal vein occlusion; CRAO central retinal artery occlusion; BRAO branch retinal artery occlusion; RT retinal tear; SB scleral buckle; PPV pars plana vitrectomy; VH Vitreous hemorrhage; PRP panretinal laser photocoagulation; IVK intravitreal kenalog; VMT vitreomacular traction; MH Macular  hole;  NVD neovascularization of the disc; NVE neovascularization elsewhere; AREDS age related eye disease study; ARMD age related macular degeneration; POAG primary open angle glaucoma; EBMD epithelial/anterior basement membrane dystrophy; ACIOL anterior chamber intraocular lens; IOL intraocular lens; PCIOL posterior chamber intraocular lens; Phaco/IOL phacoemulsification with intraocular lens placement; [place] photorefractive keratectomy; LASIK laser assisted in situ keratomileusis; HTN hypertension; DM diabetes mellitus; COPD chronic obstructive pulmonary disease

## 2019-05-11 ENCOUNTER — Ambulatory Visit (INDEPENDENT_AMBULATORY_CARE_PROVIDER_SITE_OTHER): Payer: Medicaid Other | Admitting: Ophthalmology

## 2019-05-11 ENCOUNTER — Other Ambulatory Visit: Payer: Self-pay

## 2019-05-11 ENCOUNTER — Encounter (INDEPENDENT_AMBULATORY_CARE_PROVIDER_SITE_OTHER): Payer: Self-pay | Admitting: Ophthalmology

## 2019-05-11 DIAGNOSIS — E103592 Type 1 diabetes mellitus with proliferative diabetic retinopathy without macular edema, left eye: Secondary | ICD-10-CM

## 2019-05-11 DIAGNOSIS — H3581 Retinal edema: Secondary | ICD-10-CM

## 2019-05-11 DIAGNOSIS — I10 Essential (primary) hypertension: Secondary | ICD-10-CM

## 2019-05-11 DIAGNOSIS — H53001 Unspecified amblyopia, right eye: Secondary | ICD-10-CM

## 2019-05-11 DIAGNOSIS — H35033 Hypertensive retinopathy, bilateral: Secondary | ICD-10-CM

## 2019-05-11 DIAGNOSIS — E103391 Type 1 diabetes mellitus with moderate nonproliferative diabetic retinopathy without macular edema, right eye: Secondary | ICD-10-CM | POA: Diagnosis not present

## 2019-05-11 MED ORDER — PREDNISOLONE ACETATE 1 % OP SUSP: 1.0000 [drp] | Freq: Four times a day (QID) | OPHTHALMIC | 0 refills | Status: AC

## 2019-05-12 ENCOUNTER — Other Ambulatory Visit: Payer: Self-pay | Admitting: Gastroenterology

## 2019-05-12 DIAGNOSIS — K219 Gastro-esophageal reflux disease without esophagitis: Secondary | ICD-10-CM

## 2019-05-17 SURGERY — Surgical Case
Anesthesia: *Unknown

## 2019-05-18 ENCOUNTER — Ambulatory Visit: Payer: Self-pay | Admitting: General Surgery

## 2019-05-18 ENCOUNTER — Telehealth: Payer: Self-pay

## 2019-05-18 NOTE — Telephone Encounter (Signed)
After Hours Call:   Initial Comment: Caller states she wants to set up a virtual appointment on Tuesday / 05/18/2019. Caller wants to quit smoking - could the caller have a script for Chantix? Additional Comment: Caller states she would like to schedule a virtual or tele-health appointmnt for Tuesday / 05/18/2019 (caller's day off) to discuss smoking cessation

## 2019-05-18 NOTE — Telephone Encounter (Signed)
Called back to schedule an appointment

## 2019-05-18 NOTE — Telephone Encounter (Signed)
Appt scheduled 05/21/2019.

## 2019-05-21 ENCOUNTER — Telehealth: Payer: Self-pay | Admitting: Family Medicine

## 2019-05-21 ENCOUNTER — Other Ambulatory Visit: Payer: Self-pay

## 2019-05-21 ENCOUNTER — Telehealth (INDEPENDENT_AMBULATORY_CARE_PROVIDER_SITE_OTHER): Payer: Medicaid Other | Admitting: Family Medicine

## 2019-05-21 ENCOUNTER — Encounter: Payer: Self-pay | Admitting: Family Medicine

## 2019-05-21 DIAGNOSIS — Z720 Tobacco use: Secondary | ICD-10-CM

## 2019-05-21 DIAGNOSIS — F331 Major depressive disorder, recurrent, moderate: Secondary | ICD-10-CM | POA: Diagnosis not present

## 2019-05-21 MED ORDER — VARENICLINE TARTRATE 0.5 MG X 11 & 1 MG X 42 PO MISC: ORAL | 0 refills | Status: DC

## 2019-05-21 MED ORDER — BUPROPION HCL ER (XL) 300 MG PO TB24: 300.0000 mg | ORAL_TABLET | Freq: Every day | ORAL | 2 refills | Status: DC

## 2019-05-21 NOTE — Telephone Encounter (Signed)
Called informed of PCP instructions. 

## 2019-05-21 NOTE — Telephone Encounter (Signed)
CallerLella Savage  Call Back # [phone]   Subject : Blister   Patient states that she has a blister in between her toes and it has ruptured. Patient would like to know how to handle the problem.   Please Advise

## 2019-05-21 NOTE — Telephone Encounter (Signed)
Keep clean and dry overall. Follow up if no better.

## 2019-05-21 NOTE — Progress Notes (Signed)
Chief Complaint  Patient presents with  . Nicotine Dependence    wants to stop smoking    Subjective: Patient is a 29 y.o. female here for smoking cessation. Due to COVID-19 pandemic, we are interacting via web portal for an electronic face-to-face visit. I verified patient's ID using 2 identifiers. Patient agreed to proceed with visit via this method. Patient is at work, I am at office. Patient and I are present for visit.   Pt has a hx of tobacco abuse. Smoking around 1 ppd. Wants to quit. Smokes due to stress. Interested in Chantix. Has not tried/failed anything else.  +hx of depression. Started on Wellbutrin XL 150 mg/d, compliant, no AEs, does not notice a great improvement, would like to increase. Depression is still bothersome.  Past Medical History:  Diagnosis Date  . Anxiety   . Depression   . Diabetes mellitus type 1 (HCC)    Follows with Endo  . GERD (gastroesophageal reflux disease)   . Tobacco abuse   . Wears glasses     Objective: No conversational dyspnea Age appropriate judgment and insight Nml affect and mood  Assessment and Plan: Tobacco abuse - Plan: varenicline (CHANTIX PAK) 0.5 MG X 11 & 1 MG X 42 tablet  Moderate episode of recurrent major depressive disorder (HCC) - Plan: buPROPion (WELLBUTRIN XL) 300 MG 24 hr tablet  1- Start Chantix.  2- Increase bupropion dosage.  F/u in 6 weeks.  The patient voiced understanding and agreement to the plan.  Jilda Roche [place], DO 05/21/19  10:09 AM

## 2019-05-26 ENCOUNTER — Encounter (INDEPENDENT_AMBULATORY_CARE_PROVIDER_SITE_OTHER): Payer: Medicaid Other | Admitting: Ophthalmology

## 2019-05-27 ENCOUNTER — Telehealth: Payer: Self-pay | Admitting: Family Medicine

## 2019-05-27 NOTE — Telephone Encounter (Signed)
Medication: levocetirizine (XYZAL) 5 MG tablet [[id number]] DISCONTINUED   Has the patient contacted their pharmacy? No. (If no, request that the patient contact the pharmacy for the refill.) (If yes, when and what did the pharmacy advise?)  Preferred Pharmacy (with phone number or street name):  [facility] [address] [place], [place] - [address] Phone:  [phone]  Fax:  [phone]       Agent: Please be advised that RX refills may take up to 3 business days. We ask that you follow-up with your pharmacy.

## 2019-05-27 NOTE — Telephone Encounter (Signed)
Medication is D/C is it okay to refill?

## 2019-05-28 MED ORDER — LEVOCETIRIZINE DIHYDROCHLORIDE 5 MG PO TABS: 5.0000 mg | ORAL_TABLET | Freq: Every evening | ORAL | 1 refills | Status: DC

## 2019-05-28 NOTE — Addendum Note (Signed)
Addended by: Scharlene Gloss B on: 05/28/2019 08:21 AM   Modules accepted: Orders

## 2019-05-28 NOTE — Telephone Encounter (Signed)
OK 

## 2019-05-28 NOTE — Telephone Encounter (Signed)
Refill done.  

## 2019-06-08 NOTE — Patient Instructions (Addendum)
DUE TO COVID-19 ONLY ONE VISITOR IS ALLOWED TO COME WITH YOU AND STAY IN THE WAITING ROOM ONLY DURING PRE OP AND PROCEDURE DAY OF SURGERY. THE 1 VISITOR MAY VISIT WITH YOU AFTER SURGERY IN YOUR PRIVATE ROOM DURING VISITING HOURS ONLY!  YOU NEED TO HAVE A COVID 19 TEST ON_5/22______ @__11 :00_____, THIS TEST MUST BE DONE BEFORE SURGERY, COME  [address], [place] , .  [facility]) ONCE YOUR COVID TEST IS COMPLETED, PLEASE BEGIN THE QUARANTINE INSTRUCTIONS AS OUTLINED IN YOUR HANDOUT.                Alica Shellhammer    Your procedure is scheduled on: 06/16/19   Report to [facility] Main  Entrance   Report to admitting at  9:30 AM     Call this number if you have problems the morning of surgery [phone]    Remember: Do not eat food or drink liquids :After Midnight.   BRUSH YOUR TEETH MORNING OF SURGERY AND RINSE YOUR MOUTH OUT, NO CHEWING GUM CANDY OR MINTS.   Do not eat food After Midnight.   CLEAR LIQUID DIET   Foods Allowed                                                                     Foods Excluded  Coffee and tea, regular and decaf                             liquids that you cannot  Plain Jell-O any favor except red or purple                                           see through such as: Fruit ices (not with fruit pulp)                                     milk, soups, orange juice  Iced Popsicles                                    All solid food Carbonated beverages, regular and diet                                    Cranberry, grape and apple juices Sports drinks like Gatorade Lightly seasoned clear broth or consume(fat free) Sugar, honey syrup  YOU MAY HAVE CLEAR LIQUIDS FROM MIDNIGHT UNTIL 8:30 AM.   At 8:30 AM Please finish the prescribed Pre-Surgery Gatorade drink  . Nothing by mouth after you finish the Gatorade drink !    Take these medicines the morning of surgery with A SIP OF WATER: Gabapentin, Wellbutrin,  Labetalol, Pepcid, Protonix   How to Manage Your Diabetes Before and After Surgery  Why is it important to control my blood sugar before and after surgery? . Improving blood sugar levels before and  after surgery helps healing and can limit problems. . A way of improving blood sugar control is eating a healthy diet by: o  Eating less sugar and carbohydrates o  Increasing activity/exercise o  Talking with your doctor about reaching your blood sugar goals . High blood sugars (greater than 180 mg/dL) can raise your risk of infections and slow your recovery, so you will need to focus on controlling your diabetes during the weeks before surgery. . Make sure that the doctor who takes care of your diabetes knows about your planned surgery including the date and location.  How do I manage my blood sugar before surgery? . Check your blood sugar at least 4 times a day, starting 2 days before surgery, to make sure that the level is not too high or low. o Check your blood sugar the morning of your surgery when you wake up and every 2 hours until you get to the Short Stay unit. . If your blood sugar is less than 70 mg/dL, you will need to treat for low blood sugar: o Do not take insulin. o Treat a low blood sugar (less than 70 mg/dL) with  cup of clear juice (cranberry or apple), 4 glucose tablets, OR glucose gel. o Recheck blood sugar in 15 minutes after treatment (to make sure it is greater than 70 mg/dL). If your blood sugar is not greater than 70 mg/dL on recheck, call [phone] for further instructions. . Report your blood sugar to the short stay nurse when you get to Short Stay.  . If you are admitted to the hospital after surgery: o Your blood sugar will be checked by the staff and you will probably be given insulin after surgery (instead of oral diabetes medicines) to make sure you have good blood sugar levels. o The goal for blood sugar control after surgery is 80-180 mg/dL.   WHAT DO I  DO ABOUT MY DIABETES MEDICATION?  Marland Kitchen Do not take oral diabetes medicines (pills) the morning of surgery.   . If your CBG is greater than 220 mg/dL, you may take  of your sliding scale  . (correction) dose of insulin.    For patients with insulin pumps:  Contact your diabetes doctor for specific instructions before surgery.  Decrease basal rates by 20% at midnight the night before your surgery.  Note that if your surgery is planned to be longer than 2 hours, your insulin pump will be removed and intravenous (IV) insulin will be started and managed by the nurses and the anesthesiologist.   You will be able to restart your insulin pump once you are awake and able to manage it.   Make sure to bring insulin pump supplies to the hospital with you in case the site needs to be changed.  Refer to the pump policy , flow sheet and contract. Contact your Dr. If you need advice on setting for pump.                               You may not have any metal on your body including              piercings              Do not wear jewelry,  lotions, powders or deodorant             You must shave 48 hours before surgery  Do not bring valuables to the hospital. [place].  Contacts, dentures or bridgework may not be worn into surgery.       Patients discharged the day of surgery will not be allowed to drive home.   IF YOU ARE HAVING SURGERY AND GOING HOME THE SAME DAY, YOU MUST HAVE AN ADULT TO DRIVE YOU HOME AND BE WITH YOU FOR 24 HOURS.   YOU MAY GO HOME BY TAXI OR UBER OR ORTHERWISE, BUT AN ADULT MUST ACCOMPANY YOU HOME AND STAY WITH YOU FOR 24 HOURS.  Name and phone number of your driver:  Special Instructions: N/A              Please read over the following fact sheets you were given: _____________________________________________________________________             [facility] - Preparing for Surgery Before surgery, you  can play an important role.   Because skin is not sterile, your skin needs to be as free of germs as possible.   You can reduce the number of germs on your skin by washing with CHG (chlorahexidine gluconate) soap before surgery.   CHG is an antiseptic cleaner which kills germs and bonds with the skin to continue killing germs even after washing. Please DO NOT use if you have an allergy to CHG or antibacterial soaps.   If your skin becomes reddened/irritated stop using the CHG and inform your nurse when you arrive at Short Stay. Do not shave (including legs and underarms) for at least 48 hours prior to the first CHG shower.   Please follow these instructions carefully:  1.  Shower with CHG Soap the night before surgery and the  morning of Surgery.  2.  If you choose to wash your hair, wash your hair first as usual with your  normal  shampoo.  3.  After you shampoo, rinse your hair and body thoroughly to remove the  shampoo.                                        4.  Use CHG as you would any other liquid soap.  You can apply chg directly  to the skin and wash                       Gently with a scrungie or clean washcloth.  5.  Apply the CHG Soap to your body ONLY FROM THE NECK DOWN.   Do not use on face/ open                           Wound or open sores. Avoid contact with eyes, ears mouth and genitals (private parts).                       Wash face,  Genitals (private parts) with your normal soap.             6.  Wash thoroughly, paying special attention to the area where your surgery  will be performed.  7.  Thoroughly rinse your body with warm water from the neck down.  8.  DO NOT shower/wash with your normal soap after using and rinsing off  the CHG  Soap.             9.  Pat yourself dry with a clean towel.            10.  Wear clean pajamas.            11.  Place clean sheets on your bed the night of your first shower and do not  sleep with pets. Day of Surgery : Do not apply any  lotions/deodorants the morning of surgery.  Please wear clean clothes to the hospital/surgery center.  FAILURE TO FOLLOW THESE INSTRUCTIONS MAY RESULT IN THE CANCELLATION OF YOUR SURGERY PATIENT SIGNATURE_________________________________  NURSE SIGNATURE__________________________________  ________________________________________________________________________

## 2019-06-09 ENCOUNTER — Other Ambulatory Visit: Payer: Self-pay

## 2019-06-09 ENCOUNTER — Encounter (HOSPITAL_COMMUNITY)
Admission: RE | Admit: 2019-06-09 | Discharge: 2019-06-09 | Disposition: A | Discharge status: Home / Self Care | Payer: Medicaid Other | Source: Ambulatory Visit | Attending: General Surgery | Admitting: General Surgery

## 2019-06-09 ENCOUNTER — Encounter (HOSPITAL_COMMUNITY): Payer: Self-pay

## 2019-06-09 DIAGNOSIS — Z01818 Encounter for other preprocedural examination: Secondary | ICD-10-CM | POA: Diagnosis not present

## 2019-06-09 LAB — CBC WITH DIFFERENTIAL/PLATELET
Abs Immature Granulocytes: 0.02 10*3/uL (ref 0.00–0.07)
Basophils Absolute: 0.1 10*3/uL (ref 0.0–0.1)
Basophils Relative: 1 %
Eosinophils Absolute: 0.1 10*3/uL (ref 0.0–0.5)
Eosinophils Relative: 1 %
HCT: 37.1 % (ref 36.0–46.0)
Hemoglobin: 11.7 g/dL — ABNORMAL LOW (ref 12.0–15.0)
Immature Granulocytes: 0 %
Lymphocytes Relative: 30 %
Lymphs Abs: 2.5 10*3/uL (ref 0.7–4.0)
MCH: 25.8 pg — ABNORMAL LOW (ref 26.0–34.0)
MCHC: 31.5 g/dL (ref 30.0–36.0)
MCV: 81.9 fL (ref 80.0–100.0)
Monocytes Absolute: 0.6 10*3/uL (ref 0.1–1.0)
Monocytes Relative: 7 %
Neutro Abs: 5 10*3/uL (ref 1.7–7.7)
Neutrophils Relative %: 61 %
Platelets: 255 10*3/uL (ref 150–400)
RBC: 4.53 MIL/uL (ref 3.87–5.11)
RDW: 16.3 % — ABNORMAL HIGH (ref 11.5–15.5)
WBC: 8.3 10*3/uL (ref 4.0–10.5)
nRBC: 0 % (ref 0.0–0.2)

## 2019-06-09 LAB — GLUCOSE, CAPILLARY: Glucose-Capillary: 47 mg/dL — ABNORMAL LOW (ref 70–99)

## 2019-06-09 LAB — COMPREHENSIVE METABOLIC PANEL
ALT: 26 U/L (ref 0–44)
AST: 22 U/L (ref 15–41)
Albumin: 4.1 g/dL (ref 3.5–5.0)
Alkaline Phosphatase: 78 U/L (ref 38–126)
Anion gap: 9 (ref 5–15)
BUN: 12 mg/dL (ref 6–20)
CO2: 22 mmol/L (ref 22–32)
Calcium: 9 mg/dL (ref 8.9–10.3)
Chloride: 108 mmol/L (ref 98–111)
Creatinine, Ser: 0.7 mg/dL (ref 0.44–1.00)
GFR calc Af Amer: >60 mL/min (ref >60)
GFR calc non Af Amer: >60 mL/min (ref >60)
Glucose, Bld: 47 mg/dL — ABNORMAL LOW (ref 70–99)
Potassium: 4 mmol/L (ref 3.5–5.1)
Sodium: 139 mmol/L (ref 135–145)
Total Bilirubin: 0.7 mg/dL (ref 0.3–1.2)
Total Protein: 7.4 g/dL (ref 6.5–8.1)

## 2019-06-09 NOTE — Progress Notes (Signed)
PCP - Dr, Dorris Carnes. [place] Cardiologist - no Endocrinologist- Dr, Nelva Nay. Smith  Chest x-ray - 03/18/19 EKG - 06/09/19 Stress Test - no ECHO - no Cardiac Cath - no  Sleep Study - no CPAP -   Fasting Blood Sugar - 80-120. Pt has an insulin pump. Basil rate if 4 units/hr. Checks Blood Sugar _____ times a day Pt has a blood sensor  Blood Thinner Instructions:NA Aspirin Instructions: Last Dose:  Anesthesia review:   Patient denies shortness of breath, fever, cough and chest pain at PAT appointment  yes Patient verbalized understanding of instructions that were given to them at the PAT appointment. Patient was also instructed that they will need to review over the PAT instructions again at home before surgery. Yes  Pt was given contract to sign and flow sheet.

## 2019-06-10 LAB — HEMOGLOBIN A1C
Hgb A1c MFr Bld: 6.7 % — ABNORMAL HIGH (ref 4.8–5.6)
Mean Plasma Glucose: 146 mg/dL

## 2019-06-12 ENCOUNTER — Other Ambulatory Visit (HOSPITAL_COMMUNITY)
Admission: RE | Admit: 2019-06-12 | Discharge: 2019-06-12 | Disposition: A | Discharge status: Home / Self Care | Payer: Medicaid Other | Source: Ambulatory Visit | Attending: General Surgery | Admitting: General Surgery

## 2019-06-12 DIAGNOSIS — Z20822 Contact with and (suspected) exposure to covid-19: Secondary | ICD-10-CM | POA: Insufficient documentation

## 2019-06-12 DIAGNOSIS — Z01812 Encounter for preprocedural laboratory examination: Secondary | ICD-10-CM | POA: Insufficient documentation

## 2019-06-12 LAB — SARS CORONAVIRUS 2 (TAT 6-24 HRS): SARS Coronavirus 2: NEGATIVE

## 2019-06-16 ENCOUNTER — Encounter (HOSPITAL_COMMUNITY): Payer: Self-pay | Admitting: General Surgery

## 2019-06-16 ENCOUNTER — Other Ambulatory Visit: Payer: Self-pay | Admitting: Family Medicine

## 2019-06-16 ENCOUNTER — Ambulatory Visit (HOSPITAL_COMMUNITY)
Admission: RE | Admit: 2019-06-16 | Discharge: 2019-06-16 | Disposition: A | Discharge status: Home / Self Care | Payer: Medicaid Other | Attending: General Surgery | Admitting: General Surgery

## 2019-06-16 ENCOUNTER — Ambulatory Visit (HOSPITAL_COMMUNITY): Payer: Medicaid Other | Admitting: Certified Registered"

## 2019-06-16 ENCOUNTER — Encounter (HOSPITAL_COMMUNITY)
Admission: RE | Disposition: A | Discharge status: Home / Self Care | Payer: Self-pay | Source: Home / Self Care | Attending: General Surgery

## 2019-06-16 ENCOUNTER — Telehealth (HOSPITAL_COMMUNITY): Payer: Self-pay | Admitting: *Deleted

## 2019-06-16 DIAGNOSIS — Z6839 Body mass index (BMI) 39.0-39.9, adult: Secondary | ICD-10-CM | POA: Diagnosis not present

## 2019-06-16 DIAGNOSIS — K8010 Calculus of gallbladder with chronic cholecystitis without obstruction: Secondary | ICD-10-CM | POA: Diagnosis present

## 2019-06-16 DIAGNOSIS — Z79899 Other long term (current) drug therapy: Secondary | ICD-10-CM | POA: Insufficient documentation

## 2019-06-16 DIAGNOSIS — F329 Major depressive disorder, single episode, unspecified: Secondary | ICD-10-CM | POA: Insufficient documentation

## 2019-06-16 DIAGNOSIS — F17210 Nicotine dependence, cigarettes, uncomplicated: Secondary | ICD-10-CM | POA: Diagnosis not present

## 2019-06-16 DIAGNOSIS — K219 Gastro-esophageal reflux disease without esophagitis: Secondary | ICD-10-CM | POA: Diagnosis not present

## 2019-06-16 DIAGNOSIS — Z794 Long term (current) use of insulin: Secondary | ICD-10-CM | POA: Insufficient documentation

## 2019-06-16 DIAGNOSIS — E109 Type 1 diabetes mellitus without complications: Secondary | ICD-10-CM | POA: Diagnosis not present

## 2019-06-16 DIAGNOSIS — F419 Anxiety disorder, unspecified: Secondary | ICD-10-CM | POA: Diagnosis not present

## 2019-06-16 DIAGNOSIS — I10 Essential (primary) hypertension: Secondary | ICD-10-CM | POA: Insufficient documentation

## 2019-06-16 HISTORY — PX: CHOLECYSTECTOMY: SHX55

## 2019-06-16 LAB — PREGNANCY, URINE: Preg Test, Ur: NEGATIVE

## 2019-06-16 LAB — GLUCOSE, CAPILLARY
Glucose-Capillary: 133 mg/dL — ABNORMAL HIGH (ref 70–99)
Glucose-Capillary: 167 mg/dL — ABNORMAL HIGH (ref 70–99)

## 2019-06-16 SURGERY — LAPAROSCOPIC CHOLECYSTECTOMY
Anesthesia: General

## 2019-06-16 MED ORDER — FENTANYL CITRATE (PF) 100 MCG/2ML IJ SOLN
INTRAMUSCULAR | Status: AC
  Filled 2019-06-16: qty 2

## 2019-06-16 MED ORDER — INSULIN ASPART 100 UNIT/ML ~~LOC~~ SOLN
3.0000 [IU] | Freq: Once | SUBCUTANEOUS | Status: AC
  Administered 2019-06-16: 3 [IU] via SUBCUTANEOUS

## 2019-06-16 MED ORDER — SUGAMMADEX SODIUM 500 MG/5ML IV SOLN
INTRAVENOUS | Status: DC | PRN
  Administered 2019-06-16: 300 mg via INTRAVENOUS

## 2019-06-16 MED ORDER — LACTATED RINGERS IV SOLN: INTRAVENOUS | Status: DC

## 2019-06-16 MED ORDER — CHLORHEXIDINE GLUCONATE CLOTH 2 % EX PADS: 6.0000 | MEDICATED_PAD | Freq: Once | CUTANEOUS | Status: DC

## 2019-06-16 MED ORDER — DIPHENHYDRAMINE HCL 50 MG/ML IJ SOLN
INTRAMUSCULAR | Status: AC
  Filled 2019-06-16: qty 1

## 2019-06-16 MED ORDER — LACTATED RINGERS IR SOLN
Status: DC | PRN
  Administered 2019-06-16: 1000 mL

## 2019-06-16 MED ORDER — MIDAZOLAM HCL 5 MG/5ML IJ SOLN
INTRAMUSCULAR | Status: DC | PRN
  Administered 2019-06-16: 2 mg via INTRAVENOUS

## 2019-06-16 MED ORDER — OXYCODONE HCL 5 MG PO TABS
5.0000 mg | ORAL_TABLET | Freq: Four times a day (QID) | ORAL | 0 refills | Status: DC | PRN
Start: 2019-06-16 — End: 2019-10-05

## 2019-06-16 MED ORDER — PHENYLEPHRINE 40 MCG/ML (10ML) SYRINGE FOR IV PUSH (FOR BLOOD PRESSURE SUPPORT)
PREFILLED_SYRINGE | INTRAVENOUS | Status: AC
  Filled 2019-06-16: qty 10

## 2019-06-16 MED ORDER — PROPOFOL 10 MG/ML IV BOLUS
INTRAVENOUS | Status: AC
  Filled 2019-06-16: qty 20

## 2019-06-16 MED ORDER — BUPIVACAINE-EPINEPHRINE 0.25% -1:200000 IJ SOLN
INTRAMUSCULAR | Status: DC | PRN
  Administered 2019-06-16: 30 mL

## 2019-06-16 MED ORDER — OXYCODONE HCL 5 MG PO TABS
ORAL_TABLET | ORAL | Status: AC
  Administered 2019-06-16: 5 mg via ORAL
  Filled 2019-06-16: qty 1

## 2019-06-16 MED ORDER — DEXAMETHASONE SODIUM PHOSPHATE 10 MG/ML IJ SOLN
INTRAMUSCULAR | Status: AC
  Filled 2019-06-16: qty 1

## 2019-06-16 MED ORDER — LIDOCAINE 2% (20 MG/ML) 5 ML SYRINGE
INTRAMUSCULAR | Status: AC
  Filled 2019-06-16: qty 5

## 2019-06-16 MED ORDER — PROPOFOL 10 MG/ML IV BOLUS
INTRAVENOUS | Status: DC | PRN
  Administered 2019-06-16: 200 mg via INTRAVENOUS

## 2019-06-16 MED ORDER — 0.9 % SODIUM CHLORIDE (POUR BTL) OPTIME
TOPICAL | Status: DC | PRN
  Administered 2019-06-16: 1000 mL

## 2019-06-16 MED ORDER — IBUPROFEN 800 MG PO TABS: 800.0000 mg | ORAL_TABLET | Freq: Three times a day (TID) | ORAL | 0 refills | Status: DC | PRN

## 2019-06-16 MED ORDER — ENSURE PRE-SURGERY PO LIQD
296.0000 mL | Freq: Once | ORAL | Status: DC
  Filled 2019-06-16: qty 296

## 2019-06-16 MED ORDER — KETOROLAC TROMETHAMINE 30 MG/ML IJ SOLN: 30.0000 mg | Freq: Once | INTRAMUSCULAR | Status: AC | PRN

## 2019-06-16 MED ORDER — GABAPENTIN 300 MG PO CAPS
300.0000 mg | ORAL_CAPSULE | ORAL | Status: DC
  Filled 2019-06-16: qty 1

## 2019-06-16 MED ORDER — DEXAMETHASONE SODIUM PHOSPHATE 4 MG/ML IJ SOLN
INTRAMUSCULAR | Status: DC | PRN
  Administered 2019-06-16: 8 mg via INTRAVENOUS

## 2019-06-16 MED ORDER — OXYCODONE HCL 5 MG PO TABS: 5.0000 mg | ORAL_TABLET | Freq: Once | ORAL | Status: AC | PRN

## 2019-06-16 MED ORDER — ONDANSETRON HCL 4 MG/2ML IJ SOLN
INTRAMUSCULAR | Status: AC
  Filled 2019-06-16: qty 2

## 2019-06-16 MED ORDER — OXYCODONE HCL 5 MG/5ML PO SOLN: 5.0000 mg | Freq: Once | ORAL | Status: AC | PRN

## 2019-06-16 MED ORDER — ROCURONIUM BROMIDE 10 MG/ML (PF) SYRINGE
PREFILLED_SYRINGE | INTRAVENOUS | Status: DC | PRN
  Administered 2019-06-16: 60 mg via INTRAVENOUS

## 2019-06-16 MED ORDER — MIDAZOLAM HCL 2 MG/2ML IJ SOLN
INTRAMUSCULAR | Status: AC
  Filled 2019-06-16: qty 2

## 2019-06-16 MED ORDER — ONDANSETRON HCL 4 MG/2ML IJ SOLN
INTRAMUSCULAR | Status: DC | PRN
  Administered 2019-06-16: 4 mg via INTRAVENOUS

## 2019-06-16 MED ORDER — FENTANYL CITRATE (PF) 100 MCG/2ML IJ SOLN
INTRAMUSCULAR | Status: DC | PRN
  Administered 2019-06-16: 25 ug via INTRAVENOUS
  Administered 2019-06-16: 100 ug via INTRAVENOUS

## 2019-06-16 MED ORDER — ROCURONIUM BROMIDE 10 MG/ML (PF) SYRINGE
PREFILLED_SYRINGE | INTRAVENOUS | Status: AC
  Filled 2019-06-16: qty 10

## 2019-06-16 MED ORDER — DIPHENHYDRAMINE HCL 50 MG/ML IJ SOLN
INTRAMUSCULAR | Status: DC | PRN
  Administered 2019-06-16: 12.5 mg via INTRAVENOUS

## 2019-06-16 MED ORDER — FENTANYL CITRATE (PF) 250 MCG/5ML IJ SOLN
INTRAMUSCULAR | Status: AC
  Filled 2019-06-16: qty 5

## 2019-06-16 MED ORDER — KETOROLAC TROMETHAMINE 30 MG/ML IJ SOLN
INTRAMUSCULAR | Status: AC
  Administered 2019-06-16: 30 mg via INTRAVENOUS
  Filled 2019-06-16: qty 1

## 2019-06-16 MED ORDER — PHENYLEPHRINE 40 MCG/ML (10ML) SYRINGE FOR IV PUSH (FOR BLOOD PRESSURE SUPPORT)
PREFILLED_SYRINGE | INTRAVENOUS | Status: DC | PRN
  Administered 2019-06-16: 80 ug via INTRAVENOUS
  Administered 2019-06-16: 120 ug via INTRAVENOUS

## 2019-06-16 MED ORDER — CEFAZOLIN SODIUM-DEXTROSE 2-4 GM/100ML-% IV SOLN
2.0000 g | INTRAVENOUS | Status: AC
  Administered 2019-06-16: 2 g via INTRAVENOUS
  Filled 2019-06-16: qty 100

## 2019-06-16 MED ORDER — KETAMINE HCL 10 MG/ML IJ SOLN
INTRAMUSCULAR | Status: DC | PRN
  Administered 2019-06-16: 30 mg via INTRAVENOUS

## 2019-06-16 MED ORDER — KETOROLAC TROMETHAMINE 15 MG/ML IJ SOLN
15.0000 mg | INTRAMUSCULAR | Status: AC
  Administered 2019-06-16: 15 mg via INTRAVENOUS
  Filled 2019-06-16: qty 1

## 2019-06-16 MED ORDER — BUPIVACAINE-EPINEPHRINE (PF) 0.25% -1:200000 IJ SOLN
INTRAMUSCULAR | Status: AC
  Filled 2019-06-16: qty 30

## 2019-06-16 MED ORDER — FENTANYL CITRATE (PF) 100 MCG/2ML IJ SOLN
INTRAMUSCULAR | Status: AC
  Administered 2019-06-16: 50 ug via INTRAVENOUS
  Filled 2019-06-16: qty 2

## 2019-06-16 MED ORDER — LIDOCAINE 2% (20 MG/ML) 5 ML SYRINGE
INTRAMUSCULAR | Status: DC | PRN
  Administered 2019-06-16: 100 mg via INTRAVENOUS
  Administered 2019-06-16: 1.5 mg/kg/h via INTRAVENOUS

## 2019-06-16 MED ORDER — ACETAMINOPHEN 500 MG PO TABS
1000.0000 mg | ORAL_TABLET | ORAL | Status: AC
  Administered 2019-06-16: 1000 mg via ORAL
  Filled 2019-06-16: qty 2

## 2019-06-16 MED ORDER — FENTANYL CITRATE (PF) 100 MCG/2ML IJ SOLN
25.0000 ug | INTRAMUSCULAR | Status: DC | PRN
  Administered 2019-06-16 (×2): 50 ug via INTRAVENOUS

## 2019-06-16 SURGICAL SUPPLY — 44 items
APPLIER CLIP ROT 10 11.4 M/L (STAPLE)
BENZOIN TINCTURE PRP APPL 2/3 (GAUZE/BANDAGES/DRESSINGS) ×2 IMPLANT
BNDG ADH 1X3 SHEER STRL LF (GAUZE/BANDAGES/DRESSINGS) ×8 IMPLANT
BNDG COHESIVE 2X5 TAN STRL LF (GAUZE/BANDAGES/DRESSINGS) ×2 IMPLANT
CABLE HIGH FREQUENCY MONO STRZ (ELECTRODE) ×2 IMPLANT
CATH CHOLANG 76X19 KUMAR (CATHETERS) ×2 IMPLANT
CHLORAPREP W/TINT 26 (MISCELLANEOUS) ×2 IMPLANT
CLIP APPLIE ROT 10 11.4 M/L (STAPLE) IMPLANT
CLIP VESOLOCK LG 6/CT PURPLE (CLIP) IMPLANT
CLIP VESOLOCK MED LG 6/CT (CLIP) ×4 IMPLANT
COVER MAYO STAND STRL (DRAPES) ×2 IMPLANT
COVER SURGICAL LIGHT HANDLE (MISCELLANEOUS) ×2 IMPLANT
COVER WAND RF STERILE (DRAPES) IMPLANT
DECANTER SPIKE VIAL GLASS SM (MISCELLANEOUS) ×2 IMPLANT
DERMABOND ADVANCED (GAUZE/BANDAGES/DRESSINGS) ×1
DERMABOND ADVANCED .7 DNX12 (GAUZE/BANDAGES/DRESSINGS) ×1 IMPLANT
DRAIN CHANNEL 19F RND (DRAIN) IMPLANT
DRAPE C-ARM 42X120 X-RAY (DRAPES) IMPLANT
EVACUATOR SILICONE 100CC (DRAIN) IMPLANT
GLOVE BIOGEL PI IND STRL 7.0 (GLOVE) ×1 IMPLANT
GLOVE BIOGEL PI INDICATOR 7.0 (GLOVE) ×1
GLOVE SURG SS PI 7.0 STRL IVOR (GLOVE) ×2 IMPLANT
GOWN STRL REUS W/TWL LRG LVL3 (GOWN DISPOSABLE) ×2 IMPLANT
GOWN STRL REUS W/TWL XL LVL3 (GOWN DISPOSABLE) ×4 IMPLANT
GRASPER SUT TROCAR 14GX15 (MISCELLANEOUS) IMPLANT
KIT BASIN (CUSTOM PROCEDURE TRAY) ×2 IMPLANT
KIT TURNOVER KIT A (KITS) IMPLANT
POUCH RETRIEVAL ECOSAC 10 (ENDOMECHANICALS) ×1 IMPLANT
POUCH RETRIEVAL ECOSAC 10MM (ENDOMECHANICALS) ×1
SCISSORS LAP 5X35 DISP (ENDOMECHANICALS) ×2 IMPLANT
SET IRRIG TUBING LAPAROSCOPIC (IRRIGATION / IRRIGATOR) ×2 IMPLANT
SET TUBE SMOKE EVAC HIGH FLOW (TUBING) ×2 IMPLANT
SLEEVE XCEL OPT CAN 5 100 (ENDOMECHANICALS) ×4 IMPLANT
STOPCOCK 4 WAY LG BORE MALE ST (IV SETS) IMPLANT
STRIP CLOSURE SKIN 1/2X4 (GAUZE/BANDAGES/DRESSINGS) ×2 IMPLANT
SUT ETHILON 2 0 PS N (SUTURE) IMPLANT
SUT MNCRL AB 4-0 PS2 18 (SUTURE) ×2 IMPLANT
SUT VICRYL 0 ENDOLOOP (SUTURE) IMPLANT
TAPE STRIPS DRAPE STRL (GAUZE/BANDAGES/DRESSINGS) ×2 IMPLANT
TOWEL OR 17X26 10 PK STRL BLUE (TOWEL DISPOSABLE) ×2 IMPLANT
TOWEL OR NON WOVEN STRL DISP B (DISPOSABLE) IMPLANT
TRAY LAPAROSCOPIC (CUSTOM PROCEDURE TRAY) ×2 IMPLANT
TROCAR BLADELESS OPT 5 100 (ENDOMECHANICALS) ×2 IMPLANT
TROCAR XCEL NON-BLD 11X100MML (ENDOMECHANICALS) ×2 IMPLANT

## 2019-06-16 NOTE — Anesthesia Procedure Notes (Signed)
Procedure Name: Intubation Date/Time: 06/16/2019 10:48 AM Performed by: Deliah Boston, CRNA Pre-anesthesia Checklist: Patient identified, Emergency Drugs available, Suction available and Patient being monitored Patient Re-evaluated:Patient Re-evaluated prior to induction Oxygen Delivery Method: Circle system utilized Preoxygenation: Pre-oxygenation with 100% oxygen Induction Type: IV induction Ventilation: Mask ventilation without difficulty Laryngoscope Size: Mac and 3 Grade View: Grade I Tube type: Oral Tube size: 7.0 mm Number of attempts: 1 Airway Equipment and Method: Stylet and Oral airway Placement Confirmation: ETT inserted through vocal cords under direct vision,  positive ETCO2 and breath sounds checked- equal and bilateral Secured at: 22 cm Tube secured with: Tape Dental Injury: Teeth and Oropharynx as per pre-operative assessment

## 2019-06-16 NOTE — Transfer of Care (Signed)
Immediate Anesthesia Transfer of Care Note  Patient: Molly Savage  Procedure(s) Performed: Procedure(s): LAPAROSCOPIC CHOLECYSTECTOMY (N/A)  Patient Location: PACU  Anesthesia Type:General  Level of Consciousness: Patient easily awoken, sedated, comfortable, cooperative, following commands, responds to stimulation.   Airway & Oxygen Therapy: Patient spontaneously breathing, ventilating well, oxygen via simple oxygen mask.  Post-op Assessment: Report given to PACU RN, vital signs reviewed and stable, moving all extremities.   Post vital signs: Reviewed and stable.  Complications: No apparent anesthesia complications Last Vitals:  Vitals Value Taken Time  BP 161/98 06/16/19 1146  Temp    Pulse 76 06/16/19 1149  Resp 31 06/16/19 1149  SpO2 100 % 06/16/19 1149  Vitals shown include unvalidated device data.  Last Pain:  Vitals:   06/16/19 1023  TempSrc:   PainSc: 4       Patients Stated Pain Goal: 3 (06/16/19 1023)  Complications: No apparent anesthesia complications

## 2019-06-16 NOTE — Op Note (Signed)
PATIENT:  Molly Savage  29 y.o. female  PRE-OPERATIVE DIAGNOSIS:  CHRONIC CALCULOUS CHOLECYSTITIS  POST-OPERATIVE DIAGNOSIS:  CHRONIC CALCULOUS CHOLECYSTITIS  PROCEDURE:  Procedure(s): LAPAROSCOPIC CHOLECYSTECTOMY   SURGEON:  Surgeon(s): Kinsinger, De Blanch, MD Hedda Slade, PA-C  ASSISTANT: Hedda Slade  ANESTHESIA:   local and general  Indications for procedure: Molly Savage is a 29 y.o. female with symptoms of Abdominal pain and Nausea and vomiting consistent with gallbladder disease, Confirmed by Ultrasound.  Description of procedure: The patient was brought into the operative suite, placed supine. Anesthesia was administered with endotracheal tube. Patient was strapped in place and foot board was secured. All pressure points were offloaded by foam padding. The patient was prepped and draped in the usual sterile fashion.  A small incision was made to the right of the umbilicus. A 59mm trocar was inserted into the peritoneal cavity with optical entry. Pneumoperitoneum was applied with high flow low pressure. 2 53mm trocars were placed in the RUQ. A 73mm trocar was placed in the subxiphoid space. Marcaine was infused to the subxiphoid space and lateral upper right abdomen in the transversus abdominis plane. Next the patient was placed in reverse trendelenberg. The gallbladder was white in color with adhesions of the omentum and duodenum to the body and infundibulum of the gallbladder. The adhesions were broad and lysed with cautery.  The gallbladder was retracted cephalad and lateral. The peritoneum was reflected off the infundibulum working lateral to medial. The cystic duct and cystic artery were identified and further dissection revealed a critical view. The cystic duct and cystic artery were doubly clipped and ligated.   The gallbladder was removed off the liver bed with cautery. The Gallbladder was placed in a specimen bag. The gallbladder fossa was irrigated and hemostasis  was applied with cautery. The gallbladder was removed via the 35mm trocar. The fascial defect was closed with interrupted 0 vicryl suture via laparoscopic trans-fascial suture passer. Pneumoperitoneum was removed, all trocar were removed. All incisions were closed with 4-0 monocryl subcuticular stitch. The patient woke from anesthesia and was brought to PACU in stable condition. All counts were correct  Findings: chronic calculous cholecystitis  Specimen: gallbladder  Blood loss: 20 ml  Local anesthesia: 30 ml marcaine  Complications: none  PLAN OF CARE: Discharge to home after PACU  PATIENT DISPOSITION:  PACU - hemodynamically stable.  Images:     Feliciana Rossetti, M.D. General, Bariatric, & Minimally Invasive Surgery [facility]

## 2019-06-16 NOTE — H&P (Signed)
Molly Savage is an 29 y.o. female.   Chief Complaint: abdominal pain HPI: 29 yo female with epigastric pain and nausea. She had a work up showing large gallstones. She has made minor changes to her diet without improvement.  Past Medical History:  Diagnosis Date  . Anxiety   . Depression   . Diabetes mellitus type 1 ([place])    Follows with Endo  . GERD (gastroesophageal reflux disease)   . Tobacco abuse   . Wears glasses     Past Surgical History:  Procedure Laterality Date  . CESAREAN SECTION N/A 02/01/2016   Procedure: CESAREAN SECTION;  Surgeon: Mora Bellman, MD;  Location: [facility];  Service: Obstetrics;  Laterality: N/A;  . FOOT SURGERY Left   . TOOTH EXTRACTION  2018    Family History  Problem Relation Age of Onset  . Hypertension Mother   . Bipolar disorder Sister   . Lupus Brother   . Heart disease Maternal Uncle   . ADD / ADHD Brother   . Colon cancer Neg Hx   . Esophageal cancer Neg Hx   . Rectal cancer Neg Hx   . Stomach cancer Neg Hx    Social History:  reports that she has been smoking cigarettes. She has a 6.00 pack-year smoking history. She has never used smokeless tobacco. She reports that she does not drink alcohol or use drugs.  Allergies: No Known Allergies  Medications Prior to Admission  Medication Sig Dispense Refill  . buPROPion (WELLBUTRIN XL) 150 MG 24 hr tablet Take 150 mg by mouth daily.    . famotidine (PEPCID) 20 MG tablet Take 1 tablet (20 mg total) by mouth every morning. 30 tablet 11  . gabapentin (NEURONTIN) 300 MG capsule Take 900 mg by mouth 2 (two) times daily.     Marland Kitchen ibuprofen (ADVIL,MOTRIN) 800 MG tablet Take 1 tablet (800 mg total) by mouth every 8 (eight) hours as needed. 30 tablet 2  . insulin aspart (NOVOLOG) 100 UNIT/ML injection Use as directed in insulin pump. MDD = 125 units. DX. E10.65    . labetalol (NORMODYNE) 100 MG tablet Take 1 tablet by mouth once daily (Patient taking differently: Take 100 mg by mouth  daily. Take 1 tablet by mouth once daily) 30 tablet 0  . levocetirizine (XYZAL) 5 MG tablet Take 1 tablet (5 mg total) by mouth every evening. 90 tablet 1  . pantoprazole (PROTONIX) 40 MG tablet TAKE 1 TABLET BY MOUTH ONCE DAILY 20-30 MINUTES BEFORE SUPPER (Patient taking differently: Take 40 mg by mouth daily. ) 30 tablet 6  . buPROPion (WELLBUTRIN XL) 300 MG 24 hr tablet Take 1 tablet (300 mg total) by mouth daily. (Patient not taking: Reported on 06/01/2019) 30 tablet 2  . Insulin Infusion Pump (MINIMED 630G INSULIN PUMP) KIT by Does not apply route.    . varenicline (CHANTIX PAK) 0.5 MG X 11 & 1 MG X 42 tablet Take 0.5 mg tab by mouth daily for 3 days, then increase to 0.5 mg tab twice daily for 4 days, then increase to one 1 mg tab twice daily. 53 tablet 0    No results found for this or any previous visit (from the past 48 hour(s)). No results found.  Review of Systems  Constitutional: Negative for chills and fever.  HENT: Negative for hearing loss.   Respiratory: Negative for cough.   Cardiovascular: Negative for chest pain and palpitations.  Gastrointestinal: Negative for abdominal pain, nausea and vomiting.  Genitourinary: Negative for  dysuria and urgency.  Musculoskeletal: Negative for myalgias and neck pain.  Skin: Negative for rash.  Neurological: Negative for dizziness and headaches.  Hematological: Does not bruise/bleed easily.  Psychiatric/Behavioral: Negative for suicidal ideas.    Blood pressure (!) 136/99, pulse 90, temperature 98 F (36.7 C), temperature source Oral, resp. rate 16, height 5' 8.5" (1.74 m), weight 118.5 kg, last menstrual period 06/11/2019, SpO2 98 %. Physical Exam  Vitals reviewed. Constitutional: She is oriented to person, place, and time. She appears well-developed and well-nourished.  HENT:  Head: Normocephalic and atraumatic.  Eyes: Pupils are equal, round, and reactive to light. Conjunctivae and EOM are normal.  Cardiovascular: Normal rate and  regular rhythm.  Respiratory: Effort normal and breath sounds normal.  GI: Soft. Bowel sounds are normal. She exhibits no distension. There is no abdominal tenderness.  Musculoskeletal:        General: Normal range of motion.     Cervical back: Normal range of motion and neck supple.  Neurological: She is alert and oriented to person, place, and time.  Skin: Skin is warm and dry.  Psychiatric: She has a normal mood and affect. Her behavior is normal.     Assessment/Plan 29 yo female with chronic calculous cholecystitis -lap chole -planned outpatient procedure  Mickeal Skinner, MD 06/16/2019, 10:14 AM

## 2019-06-16 NOTE — Anesthesia Preprocedure Evaluation (Signed)
Anesthesia Evaluation  Patient identified by MRN, date of birth, ID band Patient awake    Reviewed: Allergy & Precautions, NPO status , Patient's Chart, lab work & pertinent test results  Airway Mallampati: II  TM Distance: >3 FB Neck ROM: Full    Dental no notable dental hx.    Pulmonary Current Smoker,    Pulmonary exam normal breath sounds clear to auscultation       Cardiovascular hypertension, Normal cardiovascular exam Rhythm:Regular Rate:Normal     Neuro/Psych negative neurological ROS  negative psych ROS   GI/Hepatic negative GI ROS, Neg liver ROS,   Endo/Other  diabetes, Insulin DependentMorbid obesity  Renal/GU negative Renal ROS  negative genitourinary   Musculoskeletal negative musculoskeletal ROS (+)   Abdominal   Peds negative pediatric ROS (+)  Hematology negative hematology ROS (+)   Anesthesia Other Findings   Reproductive/Obstetrics negative OB ROS                             Anesthesia Physical Anesthesia Plan  ASA: III  Anesthesia Plan: General   Post-op Pain Management:    Induction: Intravenous  PONV Risk Score and Plan: 2 and Ondansetron, Dexamethasone and Treatment may vary due to age or medical condition  Airway Management Planned: Oral ETT  Additional Equipment:   Intra-op Plan:   Post-operative Plan: Extubation in OR  Informed Consent: I have reviewed the patients History and Physical, chart, labs and discussed the procedure including the risks, benefits and alternatives for the proposed anesthesia with the patient or authorized representative who has indicated his/her understanding and acceptance.     Dental advisory given  Plan Discussed with: CRNA and Surgeon  Anesthesia Plan Comments:         Anesthesia Quick Evaluation

## 2019-06-17 ENCOUNTER — Encounter: Payer: Self-pay | Admitting: *Deleted

## 2019-06-17 ENCOUNTER — Encounter: Payer: Self-pay | Admitting: Podiatry

## 2019-06-17 LAB — SURGICAL PATHOLOGY

## 2019-06-17 NOTE — Telephone Encounter (Signed)
Response discussed w/ Dr. Patsy Lager.

## 2019-06-17 NOTE — Anesthesia Postprocedure Evaluation (Signed)
Anesthesia Post Note  Patient: Molly Savage  Procedure(s) Performed: LAPAROSCOPIC CHOLECYSTECTOMY (N/A )     Patient location during evaluation: PACU Anesthesia Type: General Level of consciousness: awake and alert Pain management: pain level controlled Vital Signs Assessment: post-procedure vital signs reviewed and stable Respiratory status: spontaneous breathing, nonlabored ventilation, respiratory function stable and patient connected to nasal cannula oxygen Cardiovascular status: blood pressure returned to baseline and stable Postop Assessment: no apparent nausea or vomiting Anesthetic complications: no    Last Vitals:  Vitals:   06/16/19 1410 06/16/19 1515  BP: (!) 154/91   Pulse: 78   Resp:  16  Temp:    SpO2: 97%     Last Pain:  Vitals:   06/16/19 1515  TempSrc:   PainSc: 3                  Saya Mccoll S

## 2019-06-18 LAB — GLUCOSE, CAPILLARY: Glucose-Capillary: 102 mg/dL — ABNORMAL HIGH (ref 70–99)

## 2019-06-21 NOTE — Progress Notes (Signed)
[facility] Note  06/22/2019     CHIEF COMPLAINT Patient presents for Retina Follow Up   HISTORY OF PRESENT ILLNESS: Molly Savage is a 29 y.o. female who presents to the clinic today for:   HPI    Retina Follow Up    Patient presents with  Diabetic Retinopathy.  In both eyes.  Duration of 6 weeks.  I, the attending physician,  performed the HPI with the patient and updated documentation appropriately.          Comments    6 week follow up PDR OU-  After laser Tx OS, pt now has a large "floater" OS.  Moves but when she focus' on something it will get in her way.  Denies any FOLs or decreased vision.  Pt had gallbladder removed 06/16/2019.       Last edited by Bernarda Caffey, MD on 06/25/2019  1:32 AM. (History)      Referring physician: Shelda Pal, DO [address] [place],  [place] [phone]  HISTORICAL INFORMATION:   Selected notes from the MEDICAL RECORD NUMBER Referral from Dr. Frederico Hamman on 03/26/19 for eval of diabetic mac edema    CURRENT MEDICATIONS: No current outpatient medications on file. (Ophthalmic Drugs)   No current facility-administered medications for this visit. (Ophthalmic Drugs)   Current Outpatient Medications (Other)  Medication Sig  . buPROPion (WELLBUTRIN XL) 150 MG 24 hr tablet Take 150 mg by mouth daily.  . famotidine (PEPCID) 20 MG tablet Take 1 tablet (20 mg total) by mouth every morning.  . gabapentin (NEURONTIN) 300 MG capsule Take 900 mg by mouth 2 (two) times daily.   Marland Kitchen ibuprofen (ADVIL) 800 MG tablet Take 1 tablet (800 mg total) by mouth every 8 (eight) hours as needed.  . insulin aspart (NOVOLOG) 100 UNIT/ML injection Use as directed in insulin pump. MDD = 125 units. DX. E10.65  . Insulin Infusion Pump (MINIMED 630G INSULIN PUMP) KIT by Does not apply route.  . labetalol (NORMODYNE) 100 MG tablet Take 1 tablet by mouth once daily  . levocetirizine (XYZAL) 5 MG tablet Take 1 tablet (5  mg total) by mouth every evening.  Marland Kitchen oxyCODONE (OXY IR/ROXICODONE) 5 MG immediate release tablet Take 1 tablet (5 mg total) by mouth every 6 (six) hours as needed for severe pain.  . pantoprazole (PROTONIX) 40 MG tablet TAKE 1 TABLET BY MOUTH ONCE DAILY 20-30 MINUTES BEFORE SUPPER (Patient taking differently: Take 40 mg by mouth daily. )  . varenicline (CHANTIX PAK) 0.5 MG X 11 & 1 MG X 42 tablet Take 0.5 mg tab by mouth daily for 3 days, then increase to 0.5 mg tab twice daily for 4 days, then increase to one 1 mg tab twice daily.   No current facility-administered medications for this visit. (Other)      REVIEW OF SYSTEMS: ROS    Positive for: Endocrine, Eyes   Negative for: Constitutional, Gastrointestinal, Neurological, Skin, Genitourinary, Musculoskeletal, HENT, Cardiovascular, Respiratory, Psychiatric, Allergic/Imm, Heme/Lymph   Last edited by Leonie Douglas, COA on 06/22/2019  9:53 AM. (History)       ALLERGIES No Known Allergies  PAST MEDICAL HISTORY Past Medical History:  Diagnosis Date  . Anxiety   . Depression   . Diabetes mellitus type 1 ([place])    Follows with Endo  . GERD (gastroesophageal reflux disease)   . Tobacco abuse   . Wears glasses    Past Surgical History:  Procedure  Laterality Date  . CESAREAN SECTION N/A 02/01/2016   Procedure: CESAREAN SECTION;  Surgeon: Mora Bellman, MD;  Location: [facility];  Service: Obstetrics;  Laterality: N/A;  . CHOLECYSTECTOMY N/A 06/16/2019   Procedure: LAPAROSCOPIC CHOLECYSTECTOMY;  Surgeon: Kieth Brightly Arta Bruce, MD;  Location: [facility];  Service: General;  Laterality: N/A;  . FOOT SURGERY Left   . TOOTH EXTRACTION  2018    FAMILY HISTORY Family History  Problem Relation Age of Onset  . Hypertension Mother   . Bipolar disorder Sister   . Lupus Brother   . Heart disease Maternal Uncle   . ADD / ADHD Brother   . Colon cancer Neg Hx   . Esophageal cancer Neg Hx   . Rectal cancer Neg Hx   . Stomach cancer Neg  Hx     SOCIAL HISTORY Social History   Tobacco Use  . Smoking status: Current Every Day Smoker    Packs/day: 0.50    Years: 12.00    Pack years: 6.00    Types: Cigarettes  . Smokeless tobacco: Never Used  Substance Use Topics  . Alcohol use: No  . Drug use: No         OPHTHALMIC EXAM:  Base Eye Exam    Visual Acuity (Snellen - Linear)      Right Left   Dist cc 20/70 +2 20/30 +2   Dist ph cc NI    Correction: Glasses  She has another "floater" OD that stayed in the middle of vision while checking vision.       Tonometry (Tonopen, 10:03 AM)      Right Left   Pressure 17 17       Pupils      Dark Light Shape React APD   Right 3 2 Round Brisk None   Left 3 2 Round Brisk None       Visual Fields (Counting fingers)      Left Right    Full Full       Extraocular Movement      Right Left    Full Full       Neuro/Psych    Oriented x3: Yes   Mood/Affect: Normal       Dilation    Both eyes: 1.0% Mydriacyl, 2.5% Phenylephrine @ 10:03 AM        Slit Lamp and Fundus Exam    Slit Lamp Exam      Right Left   Lids/Lashes Mild Dermatochalasis - upper lid, mild Meibomian gland dysfunction Mild Dermatochalasis - upper lid, mild Meibomian gland dysfunction   Conjunctiva/Sclera White and quiet White and quiet   Cornea 1-2+ inferior PEE, Debris in tear film 1-2+ inferior PEE, Debris in tear film   Anterior Chamber Deep and quiet Deep and quiet   Iris Round and dilated, No NVI Round and dilated, No NVI   Lens Clear Clear   Vitreous Trace Vitreous syneresis Trace Vitreous syneresis       Fundus Exam      Right Left   Disc Pink and Sharp mild Pallor, Sharp rim   C/D Ratio 0.2 0.3   Macula Flat, Blunted foveal reflex, mild Epiretinal membrane, scattered punctate Microaneurysms Flat, Blunted foveal reflex, scattered, punctate Microaneurysms, Retinal pigment epithelial mottling   Vessels Mild Vascular attenuation, Tortuousity,mild av crossing changes Mild Vascular  attenuation, Tortuous, mild Copper wiring; focal NV nasal to disc--regressing   Periphery Attached, focal fibrosis IN to disc, rare MA Attached, rare MA, focal scattered exudates;  focal NV nasal to disc--regressing.  Good PRP laser changes nasal hemisphere.        Refraction    Wearing Rx      Sphere Cylinder Axis   Right +2.25 +0.25 038   Left -0.50 Sphere           IMAGING AND PROCEDURES  Imaging and Procedures for _0 @  OCT, Retina - OU - Both Eyes       Right Eye Quality was good. Central Foveal Thickness: 262. Progression has been stable. Findings include normal foveal contour, no IRF, no SRF, vitreomacular adhesion  (Focal inner retinal thinning nasal macula, irregular lamination).   Left Eye Quality was good. Central Foveal Thickness: 263. Progression has been stable. Findings include normal foveal contour, no IRF, no SRF, vitreomacular adhesion  (Broad foveal depression, irregular lamination centrally; focal pre-retinal fibrosis IN to disc caught on widefield).   Notes *Images captured and stored on drive  Diagnosis / Impression:  No IRF/SRF OU -- No DME OU Irregular lamination OU Focal PRF OS Stable from prior  Clinical management:  See below  Abbreviations: NFP - Normal foveal profile. CME - cystoid macular edema. PED - pigment epithelial detachment. IRF - intraretinal fluid. SRF - subretinal fluid. EZ - ellipsoid zone. ERM - epiretinal membrane. ORA - outer retinal atrophy. ORT - outer retinal tubulation. SRHM - subretinal hyper-reflective material                 ASSESSMENT/PLAN:    ICD-10-CM   1. 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 Moderate nonproliferative diabetic retinopathy of right eye without macular edema associated with type 1 diabetes mellitus ([place])  E10.3391   3. Retinal edema  H35.81 OCT, Retina - OU - Both Eyes  4. Essential hypertension   I10   5. Hypertensive retinopathy of both eyes  H35.033   6. Amblyopia of right eye  H53.001    1. Proliferative diabetic retinopathy w/o DME, OS  - initial exam w/ focal, early NV nasal to disc  - s/p segmental PRP OS -- nasal hemisphere -- 4.20.21   - nasal NV regressing; +scattered MA  - OCT without diabetic macular edema, both eyes   - discussed findings and prognosis  - f/u 2 mos, DFE/OCT/FA-Transit OS  2,3. Moderate nonproliferative diabetic retinopathy w/o DME, OD  - stable  - The incidence, risk factors for progression, natural history and treatment options for diabetic retinopathy were discussed with patient.    - The need for close monitoring of blood glucose, blood pressure, and serum lipids, avoiding cigarette or any type of tobacco, and the need for long term follow up was also discussed with patient.  - exam shows scattered MA; no NV  - FA (03.23.21) shows late leaking MA, no NV  - OCT without diabetic macular edema, OU  4,5. Hypertensive retinopathy OU  - discussed importance of tight BP control  - monitor  6. Amblyopia OD  - long standing  - stable  - monitor   Ophthalmic Meds Ordered this visit:  No orders of the defined types were placed in this encounter.      Return in about 2 months (around 08/22/2019) for 2 mo f/u for PDR OS w/DFE/OCT/Optos FA-Transit OS.  There are no Patient Instructions on file for this visit.   Explained the diagnoses, plan, and follow up with the patient and they expressed understanding.  Patient expressed understanding  of the importance of proper follow up care.  This document serves as a record of services personally performed by Gardiner Sleeper, MD, PhD. It was created on their behalf by Estill Bakes, COT an ophthalmic technician. The creation of this record is the provider's dictation and/or activities during the visit.    Electronically signed by: Estill Bakes, COT 06/21/19 @ 1:36 AM  Gardiner Sleeper, M.D., Ph.D. Diseases  & Surgery of the Retina and [place] 06/22/2019   I have reviewed the above documentation for accuracy and completeness, and I agree with the above. Gardiner Sleeper, M.D., Ph.D. 06/25/19 1:36 AM   Abbreviations: M myopia (nearsighted); A astigmatism; H hyperopia (farsighted); P presbyopia; Mrx spectacle prescription;  CTL contact lenses; OD right eye; OS left eye; OU both eyes  XT exotropia; ET esotropia; PEK punctate epithelial keratitis; PEE punctate epithelial erosions; DES dry eye syndrome; MGD meibomian gland dysfunction; ATs artificial tears; PFAT's preservative free artificial tears; [place] nuclear sclerotic cataract; PSC posterior subcapsular cataract; ERM epi-retinal membrane; PVD posterior vitreous detachment; RD retinal detachment; DM diabetes mellitus; DR diabetic retinopathy; NPDR non-proliferative diabetic retinopathy; PDR proliferative diabetic retinopathy; CSME clinically significant macular edema; DME diabetic macular edema; dbh dot blot hemorrhages; CWS cotton wool spot; POAG primary open angle glaucoma; C/D cup-to-disc ratio; HVF humphrey visual field; GVF goldmann visual field; OCT optical coherence tomography; IOP intraocular pressure; BRVO Branch retinal vein occlusion; CRVO central retinal vein occlusion; CRAO central retinal artery occlusion; BRAO branch retinal artery occlusion; RT retinal tear; SB scleral buckle; PPV pars plana vitrectomy; VH Vitreous hemorrhage; PRP panretinal laser photocoagulation; IVK intravitreal kenalog; VMT vitreomacular traction; MH Macular hole;  NVD neovascularization of the disc; NVE neovascularization elsewhere; AREDS age related eye disease study; ARMD age related macular degeneration; POAG primary open angle glaucoma; EBMD epithelial/anterior basement membrane dystrophy; ACIOL anterior chamber intraocular lens; IOL intraocular lens; PCIOL posterior chamber intraocular lens; Phaco/IOL phacoemulsification with intraocular  lens placement; [place] photorefractive keratectomy; LASIK laser assisted in situ keratomileusis; HTN hypertension; DM diabetes mellitus; COPD chronic obstructive pulmonary disease

## 2019-06-22 ENCOUNTER — Other Ambulatory Visit: Payer: Self-pay

## 2019-06-22 ENCOUNTER — Ambulatory Visit (INDEPENDENT_AMBULATORY_CARE_PROVIDER_SITE_OTHER): Payer: Medicaid Other | Admitting: Ophthalmology

## 2019-06-22 DIAGNOSIS — E103592 Type 1 diabetes mellitus with proliferative diabetic retinopathy without macular edema, left eye: Secondary | ICD-10-CM

## 2019-06-22 DIAGNOSIS — I10 Essential (primary) hypertension: Secondary | ICD-10-CM | POA: Diagnosis not present

## 2019-06-22 DIAGNOSIS — H53001 Unspecified amblyopia, right eye: Secondary | ICD-10-CM

## 2019-06-22 DIAGNOSIS — E103391 Type 1 diabetes mellitus with moderate nonproliferative diabetic retinopathy without macular edema, right eye: Secondary | ICD-10-CM

## 2019-06-22 DIAGNOSIS — H35033 Hypertensive retinopathy, bilateral: Secondary | ICD-10-CM

## 2019-06-22 DIAGNOSIS — H3581 Retinal edema: Secondary | ICD-10-CM | POA: Diagnosis not present

## 2019-06-25 ENCOUNTER — Encounter (INDEPENDENT_AMBULATORY_CARE_PROVIDER_SITE_OTHER): Payer: Self-pay | Admitting: Ophthalmology

## 2019-07-04 ENCOUNTER — Other Ambulatory Visit: Payer: Self-pay | Admitting: Family Medicine

## 2019-07-10 ENCOUNTER — Encounter: Payer: Self-pay | Admitting: Podiatry

## 2019-07-13 ENCOUNTER — Encounter: Payer: Self-pay | Admitting: *Deleted

## 2019-08-02 ENCOUNTER — Encounter: Payer: Self-pay | Admitting: Podiatry

## 2019-08-07 ENCOUNTER — Other Ambulatory Visit: Payer: Self-pay | Admitting: Family Medicine

## 2019-08-07 DIAGNOSIS — F331 Major depressive disorder, recurrent, moderate: Secondary | ICD-10-CM

## 2019-08-23 ENCOUNTER — Encounter (INDEPENDENT_AMBULATORY_CARE_PROVIDER_SITE_OTHER): Payer: Medicaid Other | Admitting: Ophthalmology

## 2019-09-22 ENCOUNTER — Other Ambulatory Visit: Payer: Self-pay

## 2019-09-22 ENCOUNTER — Telehealth (INDEPENDENT_AMBULATORY_CARE_PROVIDER_SITE_OTHER): Payer: Medicaid Other | Admitting: *Deleted

## 2019-09-22 DIAGNOSIS — K219 Gastro-esophageal reflux disease without esophagitis: Secondary | ICD-10-CM

## 2019-09-22 DIAGNOSIS — O10919 Unspecified pre-existing hypertension complicating pregnancy, unspecified trimester: Secondary | ICD-10-CM

## 2019-09-22 DIAGNOSIS — O119 Pre-existing hypertension with pre-eclampsia, unspecified trimester: Secondary | ICD-10-CM | POA: Insufficient documentation

## 2019-09-22 DIAGNOSIS — O24319 Unspecified pre-existing diabetes mellitus in pregnancy, unspecified trimester: Secondary | ICD-10-CM | POA: Insufficient documentation

## 2019-09-22 DIAGNOSIS — O0990 Supervision of high risk pregnancy, unspecified, unspecified trimester: Secondary | ICD-10-CM

## 2019-09-22 MED ORDER — BLOOD PRESSURE KIT DEVI: 1.0000 | 0 refills | Status: DC | PRN

## 2019-09-22 NOTE — Addendum Note (Signed)
Addended by: Gerome Apley on: 09/22/2019 04:56 PM   Modules accepted: Orders

## 2019-09-22 NOTE — Progress Notes (Signed)
[id number] patient not connected virtually for new ob intake virtual visit. I called Molly Savage home/ mobile number and was unable to leave a message- I heard message " mailbox is full and cannot accept messages." Molly Trapani,RN  0930 Molly Savage not connected virtually. I called her and again got her voicemail. Front office then reported to me she is calling - I asked them to have her connect with me virtually. Molly Lanius,RN  I connected with  Molly Savage on 09/22/19 at 9:30 by virtually and verified that I am speaking with the correct person using two identifiers. At time of visit patient was at her home, provider ( nurse) was in office at [facility].   I discussed the limitations, risks, security and privacy concerns of performing an evaluation and management service by virtually and the availability of in person appointments. I also discussed with the patient that there may be a patient responsible charge related to this service. The patient expressed understanding and agreed to proceed.  I explained I am completing her New OB Intake today. We discussed Her EDD and that it is based on  sure LMP . I reviewed her allergies, meds, OB History, Medical /Surgical history, and appropriate screenings. I informed her of [facility] services.  After discussion with Dr. Macon Large I instructed patient she may continue to  Take xyzal. I advised her we recommend her to stop   neurontin and Chantix. I also advised not to start wellbutrin until her visit. She states she is not currently taking wellbutrin.  I explained she can talk with provider about options at her ob visit.  She has had food insecurity in past but has [employer] and food stamps. Resources given in AVS.  She is G2  P1001 with HTN, Diabetes, Reflux.  I explained we will have her take her blood pressure weekly during her pregnancy.  She verified she does not have a blood pressure cuff.  I explained I will send a prescription for a Blood pressure cuff to [facility]  and she will pick up the prescription.  I asked her to bring the blood pressure cuff with her to her first ob appointment so we can show her how to use it.  I explained after her first ob appointment   we will have her take her blood pressure weekly I  explained we will have her log her blood pressures into an app that I will send her called [employer].  The  app was sent to her while on phone.     I explained she will have some visits in office and some virtually. She already has Sports coach. I reviewed her new ob  appointment date/ time with her , our location and to wear mask,  I explained she will have a pelvic exam, ob bloodwork, hemoglobin a1C, cbg ,pap, and  genetic testing if desired,- she is decided about a panorama.  I explained I will scheduled an [place] at 19 weeks and she will see the appointment later in MyChart. Also referral to Diabetic Educator ordered due to history of Diabetes and on pump; now pregnant per protocol. She voices understanding.   Molly Lemelin,RN 09/22/2019  9:33 AM

## 2019-09-22 NOTE — Progress Notes (Signed)
Patient was assessed and managed by nursing staff during this encounter. I have reviewed the chart and agree with the documentation and plan. I have also made any necessary editorial changes.  Leone Mobley A Ahaan Zobrist, MD 09/22/2019 2:31 PM   

## 2019-09-22 NOTE — Patient Instructions (Addendum)
[place] [facility]  Department of Social Services[place] [address], [place], [place] [address] [phone]   or  www.[place].gov/[place]-county/human-services/social-services **SNAP/EBT/ Other nutritional benefits  [place] DHHS-Public Health-WIC [address], [place], [place] [address] [phone]  or  https://[place]nc.gov/[place]-county/human-services/health-department **WIC for  women who are pregnant and postpartum, infants and children up to 5 years old  [facility] [address], [place], [place] [address] [phone]   or   www.theblessedtable.org  **Food pantry  [facility] [address], [place], [place] [address] [phone]   or   https://brotherkolbes.godaddysites.com  **Emergency food and prepared meals  [facility] [address], [place], [place] [address] [phone]   or   www.cedargrovetop.us **Food pantry  [facility] [address], [place], [place] [address] [phone]   or   www.facebook.com/[facility]-116430931718202 **Food pantry  [facility] [address], [place], [place] [address] [phone] **Food pantry  [facility] [address], [place], [place] [address] [phone]   or   www.greensborourbanministry.org  **Food pantry and prepared meals  [facility] [address] [place] [address], [place], [place] [address] https://jfsgreensboro.org/  **Food pantry  [facility] [address], [place], [place] [address] [phone]   or   www.lbcnow.org  **Food pantry  [facility] [address], [place], [place] [address] [phone]   or   http://www.onestepfurther.com **Food pantry, nutrition education, gardening activities  [facility] [address], [place], [place] [address] [phone] **Food pantry  [facility] [place] [address], [place], [place] [address] [phone]   or   www.salvationarmyofgreensboro.org **Food pantry  [facility] [place] [address], [place], [place] [address] [phone]   or   http://senior-resources-[place]il[place].org **Meals on Wheels Program  [facility] [address], [place], [place] [address] [phone]   or   www.stmattchurch.com  **Food pantry  [facility] [address], , [place] [address] [phone]   or   vandaliapresbyterianchurch.org **Food pantry  [place] [facility]  [place] [facility] [address], [place], [place] [address] [phone]   or   www.facebook.com/JulianUMC Food pantry  [facility] [address], [place], [place] [address] [phone] **Food pantry  [place] [facility]   Department of Public Health[place] [address], [place], [place] [address] [phone]   or   www.co.[place]pen[place].Boligee.us/ph/  [place] DHHS-Public Health-WIC ([place]) [address], [place], [place] [address] [phone]   or   https://www.[place]nc.gov/our-county/human-services/health-department **WIC for pregnant and postpartum women, infants and children up to 5 years old  [facility] [address], [place], [place] [address] [phone] **Food pantry  [facility] [address] [place] [address], [place], [place] [address] [phone]   or   emerywoodbaptistchurch.com *Food pantry  [employer] [employer] [facility] [address], [place], [place] [address] [phone]   or   www.fcchighpoint.org **Food pantry  [facility] [address], [place],  [address] [phone]   or    www.helpinghandshp.org **Food pantry  [facility] [address], [place],  [address] [phone]   or   www.facebook.com/KBCI1 **Food Pantry  [employer] [facility] [address] [address] [facility]   [address] [place], [place] [address] [phone]   or   www.abbottscreek.org [employer] [facility] [place] [phone]   **Delivers meals  [facility] [address] [place], [place] [address] [phone]   or   nbfgm.sundaystreamwebsites.com  Tree surgeon of [employer] [address] [place], [place] [address] [phone]   or   www.odm-hp.org  **Food pantry  [address] [place] [facility] [address] [place], [place] [address] [phone]   or   R2live.tv **Food pantry  [facility] [address] [place], [place] [address] [phone]   or   WrestlingMonthly.pl **Emergency food and pet food  [facility] [address] [place], [place] [address] [phone]   or   www.senioradults.org **Congregate and delivered meals to older adults  [place] [facility] [employer] [address], [place], [place] [address] [phone]   or   https://www.miller-montoya.com/ **Back Pack Program for elementary school students  [facility] [address] [place], [place] [address] [phone]   or   www.wardstreetcommunityresources.org **Food pantry  [facility] [address] [place], [place] [address] [phone]   or   ResumeQuery.com.ee **Emergency food, nutrition classes, food budgeting  [facility] Resources [place]  Department of Social [place]  [address], [place], [place] [address] [phone]  or   www.co.[place].us/pview.aspx?id=14850&catid=407 **SNAP/Other nutrition benefits  [facility] [address], [place], [place] [address] [phone]   or  FutureSponsors.be  **SNAP/Other nutrition benefits  [facility] Department of Public [facility] [address] [place], [place], [place] [address] [phone]  or  http://www.rock[place]countypublichealth.org **WIC for pregnant and postpartum women, infants and children up to 81 years old  Aging, Disability and Transit [facility] [address], [place], [place] [address] [phone]  or www.BlackjackCoupons.com.br **Prepared meals for older adults  [facility] Food [facility] [address], [place], [place] [address] [phone]  or  http://caldwell-sandoval.com/ **Food pantry  [facility] [address], [place], [place] [address] [phone]   or   https://www.handsofgod.org/  Tour manager  [facility] [place] [facility] [address] [place], [place] [address] [phone] **Food pantry  [facility] [address] [place], [place], [place] [address] [phone]   or   www.ci.[place].us/government/parks_and_recreation/senior_center/index.php **Congregate meal for older adults  [facility] [address] [place], [place] [address] [phone]   or www.reidsvilleoutreachcenter.org  **Food pantry  [facility] [address], [place], [place] [address] [phone]   or   TelevisionEnthusiast.fr **Programme researcher, broadcasting/film/video   At [employer] for [employer] at [employer] [facility], we work as an Visual merchandiser, providing care to address both physical and emotional health. Your medical provider may refer you to see our Behavioral Health Clinician [facility]) on the same day you see your medical provider, as availability permits.  Our [facility] is available to all patients, visits generally last between 20-30 minutes, but can be longer or shorter, depending on patient need. The [facility]  offers help with stress management, coping with symptoms of depression and anxiety, major life changes , sleep issues, changing risky behavior, grief and loss, life stress, working on personal life goals, and  behavioral health issues, as these all affect your overall health and wellness.  The [facility] is NOT  available for the following: FMLA paperwork, court-ordered evaluations, specialty assessments (custody or disability), letters to employers, or obtaining certification for an emotional support animal. The [facility] does not provide long-term therapy. You have the right to refuse integrated behavioral health services, or to reschedule to see the [facility] at a later date.  Exception: If you are having thoughts of suicide, we require that you either see the [facility] for further assessment, or contract for safety with your medical provider. Confidentiality exception: If it is suspected that a child or disabled adult is being abused or neglected, we are required by law to report that to either Child Protective Services or Adult Management consultant.  If you have a diagnosis of Bipolar affective disorder, Schizophrenia, or recurrent Major depressive disorder, we will recommend that you establish care with a psychiatrist, as these are lifelong, chronic conditions, and we want your overall emotional health and medications to be more closely monitored. If you anticipate needing extended maternity leave due to mental illness, it it recommended you inform your medical provider, so we can put in a referral to a  psychiatrist as soon as possible. The [facility] is unable to recommend an extended maternity leave for mental health issues. Your medical provider or [facility] may refer you to a therapist for ongoing, traditional therapy, or to a psychiatrist, for medication management, if it would benefit your overall health. Depending on your insurance, you may have a copay to see the [facility]. If you are uninsured, it is recommended that you apply for  financial assistance. (Forms may be requested at the front desk for in-person visits, via MyChart, or request a form during a virtual visit).  If you see the [facility] more than 6 times, you will have to complete a comprehensive clinical assessment interview with the [facility] to resume integrated services.  For virtual visits with the [facility], you must be physically in the state of [place] at the time of the visit. For example, if you live in [place], you will have to do an in-person visit with the [facility]. If you are going out of the state or country for any reason, the [facility] may see you virtually when you return to [place], but not while you are physically outside of [place].

## 2019-09-24 ENCOUNTER — Other Ambulatory Visit: Payer: Self-pay

## 2019-09-24 ENCOUNTER — Encounter: Payer: Medicaid Other | Attending: Obstetrics and Gynecology | Admitting: Registered"

## 2019-09-24 ENCOUNTER — Other Ambulatory Visit: Payer: Self-pay | Admitting: Family Medicine

## 2019-09-24 DIAGNOSIS — O24319 Unspecified pre-existing diabetes mellitus in pregnancy, unspecified trimester: Secondary | ICD-10-CM | POA: Insufficient documentation

## 2019-09-29 ENCOUNTER — Encounter: Payer: Self-pay | Admitting: Registered"

## 2019-09-29 NOTE — Patient Instructions (Signed)
Continue choosing unsweetened beverages. Consider including more vegetables in your daily intake Work with your MD on how they want you to use your insulin when your pre-meal number is below 70 mg/dL

## 2019-09-29 NOTE — Progress Notes (Signed)
Diabetes Self-Management Education  Visit Type: First/Initial  Appt. Start Time: 0920 Appt. End Time: 1020  09/29/2019  Ms. Molly Savage, identified by name and date of birth, is a 29 y.o. female with a diagnosis of Diabetes: Type 1 (type 1 in pregnancy).   ASSESSMENT  Last menstrual period 07/17/2019. There is no height or weight on file to calculate BMI.   Patient reports she was diagnosed with Type 1 DM at 29 yrs old. Pt states she started using an insulin pump 02/2016 and using CGM last year.  Pt reports that glucose tablets do not affect her blood sugar when she experiences lows. Pt states she was not aware that CGM has a 15 min lag time.  Patient provided Dexcom reader for RD to review. I was not able to locate Time in Range feature, however it looks like majority of time patient stays within target range as set by her MD, 70-150 mg/dL. Occasionally she will not inject insulin at meals if starting out low. A recent event was starting a meal at 67 mg/dL, did not inject insulin, post prandial peaked at 250 mg/dL, did not stay elevated long and returned to normal without insulin.  Pt states she enjoys most vegetables and plans to increase vegetable intake. Patient states she mostly snacks on higher protein foods.   Diabetes Self-Management Education - 09/29/19 0818      Visit Information   Visit Type First/Initial      Initial Visit   Diabetes Type Type 1   type 1 in pregnancy   Are you currently following a meal plan? No      Complications   Last HgB A1C per patient/outside source 6.7 %    How often do you check your blood sugar? > 4 times/day      Patient Education   Previous Diabetes Education Yes (please comment)   2017 at [facility] and has educator at Endo office   Monitoring Taught/evaluated SMBG meter.    Preconception care Reviewed with patient blood glucose goals with pregnancy      Outcomes   Expected Outcomes Demonstrated interest in learning. Expect positive  outcomes    Future DMSE PRN    Program Status Completed           Individualized Plan for Diabetes Self-Management Training:   Learning Objective:  Patient will have a greater understanding of diabetes self-management. Patient education plan is to attend individual and/or group sessions per assessed needs and concerns.   Patient Instructions  Continue choosing unsweetened beverages. Consider including more vegetables in your daily intake Work with your MD on how they want you to use your insulin when your pre-meal number is below 70 mg/dL   Expected Outcomes:  Demonstrated interest in learning. Expect positive outcomes  Education material provided: Pre-existing diabetes in pregnancy  If problems or questions, patient to contact team via:  Phone and MyChart  Future DSME appointment: PRN

## 2019-10-05 ENCOUNTER — Other Ambulatory Visit: Payer: Self-pay

## 2019-10-05 ENCOUNTER — Other Ambulatory Visit: Payer: Medicaid Other

## 2019-10-05 ENCOUNTER — Telehealth (INDEPENDENT_AMBULATORY_CARE_PROVIDER_SITE_OTHER): Payer: Medicaid Other | Admitting: Family Medicine

## 2019-10-05 ENCOUNTER — Encounter: Payer: Self-pay | Admitting: Family Medicine

## 2019-10-05 DIAGNOSIS — M25511 Pain in right shoulder: Secondary | ICD-10-CM

## 2019-10-05 DIAGNOSIS — R519 Headache, unspecified: Secondary | ICD-10-CM | POA: Diagnosis not present

## 2019-10-05 DIAGNOSIS — Z3490 Encounter for supervision of normal pregnancy, unspecified, unspecified trimester: Secondary | ICD-10-CM

## 2019-10-05 DIAGNOSIS — M25512 Pain in left shoulder: Secondary | ICD-10-CM

## 2019-10-05 MED ORDER — LIDOCAINE 5 % EX PTCH: 1.0000 | MEDICATED_PATCH | Freq: Every day | CUTANEOUS | 0 refills | Status: DC | PRN

## 2019-10-05 MED ORDER — PROMETHAZINE HCL 25 MG PO TABS
25.0000 mg | ORAL_TABLET | Freq: Three times a day (TID) | ORAL | 0 refills | Status: DC | PRN
Start: 2019-10-05 — End: 2019-10-27

## 2019-10-05 NOTE — Progress Notes (Signed)
Chief Complaint  Patient presents with  . Follow-up    medications. Patient is pregnant    Subjective: Patient is a 29 y.o. female here for discussion of meds. Due to COVID-19 pandemic, we are interacting via web portal for an electronic face-to-face visit. I verified patient's ID using 2 identifiers. Patient agreed to proceed with visit via this method. Patient is at work, I am at office. Patient and I are present for visit.     Past Medical History:  Diagnosis Date  . Anxiety   . Depression   . Diabetes mellitus type 1 (HCC)    Follows with Endo  . GERD (gastroesophageal reflux disease)   . Hypertension   . Tobacco abuse   . Wears glasses     Objective: No conversational dyspnea Age appropriate judgment and insight Nml affect and mood  Assessment and Plan: Pregnancy, unspecified gestational age  Nonintractable episodic headache, unspecified headache type  Bilateral shoulder pain, unspecified chronicity  Stop many of her meds. Appt w OB tomorrow. Tylenol prn, will have to use reg glucometer. Phenergan may be helpful.  Lidocaine patches for shoulder pain. Capsaicin cream for neuropathy.  Ck'd with pharmacy team who are in agreement with recommendations.  F/u prn.  The patient voiced understanding and agreement to the plan.  Jilda Roche Shirley, DO 10/05/19  2:58 PM

## 2019-10-06 ENCOUNTER — Other Ambulatory Visit (HOSPITAL_COMMUNITY)
Admission: RE | Admit: 2019-10-06 | Discharge: 2019-10-06 | Disposition: A | Discharge status: Home / Self Care | Payer: Medicaid Other | Source: Ambulatory Visit | Attending: Obstetrics and Gynecology | Admitting: Obstetrics and Gynecology

## 2019-10-06 ENCOUNTER — Other Ambulatory Visit: Payer: Self-pay

## 2019-10-06 ENCOUNTER — Encounter: Payer: Self-pay | Admitting: Obstetrics and Gynecology

## 2019-10-06 ENCOUNTER — Ambulatory Visit (INDEPENDENT_AMBULATORY_CARE_PROVIDER_SITE_OTHER): Payer: Medicaid Other | Admitting: Obstetrics and Gynecology

## 2019-10-06 VITALS — BP 125/84 | HR 96 | Wt 272.6 lb

## 2019-10-06 DIAGNOSIS — E1065 Type 1 diabetes mellitus with hyperglycemia: Secondary | ICD-10-CM

## 2019-10-06 DIAGNOSIS — O10919 Unspecified pre-existing hypertension complicating pregnancy, unspecified trimester: Secondary | ICD-10-CM

## 2019-10-06 DIAGNOSIS — O24319 Unspecified pre-existing diabetes mellitus in pregnancy, unspecified trimester: Secondary | ICD-10-CM

## 2019-10-06 DIAGNOSIS — E10319 Type 1 diabetes mellitus with unspecified diabetic retinopathy without macular edema: Secondary | ICD-10-CM

## 2019-10-06 DIAGNOSIS — O0990 Supervision of high risk pregnancy, unspecified, unspecified trimester: Secondary | ICD-10-CM | POA: Diagnosis present

## 2019-10-06 DIAGNOSIS — Z6841 Body Mass Index (BMI) 40.0 and over, adult: Secondary | ICD-10-CM

## 2019-10-06 DIAGNOSIS — E1042 Type 1 diabetes mellitus with diabetic polyneuropathy: Secondary | ICD-10-CM

## 2019-10-06 DIAGNOSIS — Z720 Tobacco use: Secondary | ICD-10-CM

## 2019-10-06 DIAGNOSIS — K219 Gastro-esophageal reflux disease without esophagitis: Secondary | ICD-10-CM

## 2019-10-06 LAB — POCT URINALYSIS DIP (DEVICE)
Bilirubin Urine: NEGATIVE
Glucose, UA: NEGATIVE mg/dL
Ketones, ur: NEGATIVE mg/dL
Leukocytes,Ua: NEGATIVE
Nitrite: NEGATIVE
Protein, ur: NEGATIVE mg/dL
Specific Gravity, Urine: >=1.03 (ref 1.005–1.030)
Urobilinogen, UA: 0.2 mg/dL (ref 0.0–1.0)
pH: 6 (ref 5.0–8.0)

## 2019-10-06 MED ORDER — LABETALOL HCL 100 MG PO TABS: 100.0000 mg | ORAL_TABLET | Freq: Two times a day (BID) | ORAL | 3 refills | Status: DC

## 2019-10-06 NOTE — Progress Notes (Signed)
INITIAL PRENATAL VISIT NOTE  Subjective:  Molly Savage is a 29 y.o. G2P1001 at 41w4dby sure LMP being seen today for her initial prenatal visit. This is a planned pregnancy.  She was using nothing  for birth control previously. She has an obstetric history significant for history of cesarean section. She has a medical history significant for chronic hypertension and type 1 DM with retinopathy, pt also has neuropathy.  Patient reports no complaints.  Contractions: Not present. Vag. Bleeding: None.  Movement: Absent. Denies leaking of fluid.    Past Medical History:  Diagnosis Date  . Anxiety   . Depression   . Diabetes mellitus type 1 ([place]    Follows with Endo  . GERD (gastroesophageal reflux disease)   . Hypertension   . Tobacco abuse   . Wears glasses     Past Surgical History:  Procedure Laterality Date  . CESAREAN SECTION N/A 02/01/2016   Procedure: CESAREAN SECTION;  Surgeon: PMora Bellman MD;  [facility]  Service: Obstetrics;  Laterality: N/A;  . CESAREAN SECTION    . CHOLECYSTECTOMY N/A 06/16/2019   Procedure: LAPAROSCOPIC CHOLECYSTECTOMY;  Surgeon: Kinsinger, LArta Bruce MD;  Location: [facility];  Service: General;  Laterality: N/A;  . FOOT SURGERY Left   . TOOTH EXTRACTION  2018    OB History  Gravida Para Term Preterm AB Living  2 1 1     1   SAB TAB Ectopic Multiple Live Births  0 0 0 0 1    # Outcome Date GA Lbr Len/2nd Weight Sex Delivery Anes PTL Lv  2 Current           1 Term 02/01/16 366w5d6 lb 1.7 oz (2.77 kg) M CS-LTranv EPI  LIV     B[facility]  Social History   Socioeconomic History  . Marital status: Single    Spouse name: Not on file  . Number of children: 1  . Years of education: Not on file  . Highest education level: Not on file  Occupational History  . 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 Smoking status: Current Every Day Smoker    Packs/day: 1.00    Years: 12.00    Pack years:  12.00    Types: Cigarettes  . Smokeless tobacco: Never Used  . Tobacco comment: trying to cut back   Vaping Use  . Vaping Use: Never used  Substance and Sexual Activity  . Alcohol use: Not Currently    Comment: occasionally  . Drug use: No  . Sexual activity: Yes    Birth control/protection: None  Other Topics Concern  . Not on file  Social History Narrative  . Not on file   Social Determinants of Health   Financial Resource Strain:   . Difficulty of Paying Living Expenses: Not on file  Food Insecurity: Food Insecurity Present  . Worried About RuCharity fundraisern the Last Year: Sometimes true  . Ran Out of Food in the Last Year: Sometimes true  Transportation Needs: No Transportation Needs  . Lack of Transportation (Medical): No  . Lack of Transportation (Non-Medical): No  Physical Activity:   . Days of Exercise per Week: Not on file  . Minutes of Exercise per Session: Not on file  Stress:   . Feeling of Stress : Not on file  Social Connections:   . Frequency of Communication with Friends and Family: Not on file  . Frequency  of Social Gatherings with Friends and Family: Not on file  . Attends Religious Services: Not on file  . Active Member of Clubs or Organizations: Not on file  . Attends Archivist Meetings: Not on file  . Marital Status: Not on file    Family History  Problem Relation Age of Onset  . Hypertension Mother   . Depression Mother   . Bipolar disorder Sister   . Drug abuse Sister   . Lupus Brother   . Heart disease Maternal Uncle   . ADD / ADHD Brother   . Sleep apnea Brother   . Colon cancer Neg Hx   . Esophageal cancer Neg Hx   . Rectal cancer Neg Hx   . Stomach cancer Neg Hx      Current Outpatient Medications:  .  Blood Pressure Monitoring (BLOOD PRESSURE KIT) DEVI, 1 Device by Does not apply route as needed., Disp: 1 each, Rfl: 0 .  insulin aspart (NOVOLOG) 100 UNIT/ML injection, Use as directed in insulin pump. MDD = 125  units. DX. E10.65, Disp: , Rfl:  .  Insulin Infusion Pump (MINIMED 630G INSULIN PUMP) KIT, by Does not apply route., Disp: , Rfl:  .  labetalol (NORMODYNE) 100 MG tablet, Take 1 tablet by mouth once daily, Disp: 30 tablet, Rfl: 0 .  levocetirizine (XYZAL) 5 MG tablet, TAKE 1 TABLET BY MOUTH ONCE DAILY IN THE EVENING, Disp: 30 tablet, Rfl: 0 .  lidocaine (LIDODERM) 5 %, Place 1 patch onto the skin daily as needed (Pain). Remove & Discard patch within 12 hours or as directed by MD, Disp: 30 patch, Rfl: 0 .  pantoprazole (PROTONIX) 40 MG tablet, TAKE 1 TABLET BY MOUTH ONCE DAILY 20-30 MINUTES BEFORE SUPPER (Patient taking differently: Take 40 mg by mouth daily. ), Disp: 30 tablet, Rfl: 6 .  Prenatal MV-Min-FA-Omega-3 (PRENATAL GUMMIES/DHA & FA PO), Take 1 tablet by mouth daily., Disp: , Rfl:  .  promethazine (PHENERGAN) 25 MG tablet, Take 1 tablet (25 mg total) by mouth every 8 (eight) hours as needed for nausea or vomiting (headache)., Disp: 20 tablet, Rfl: 0 .  famotidine (PEPCID) 20 MG tablet, Take 1 tablet (20 mg total) by mouth every morning. (Patient not taking: Reported on 10/06/2019), Disp: 30 tablet, Rfl: 11  No Known Allergies  Review of Systems: Negative except for what is mentioned in HPI.  Objective:   Vitals:   10/06/19 1525  BP: 125/84  Pulse: 96  Weight: 272 lb 9.6 oz (123.7 kg)    Fetal Status: Fetal Heart Rate (bpm): 166 (U/S)    Movement: Absent     Physical Exam: BP 125/84   Pulse 96   Wt 272 lb 9.6 oz (123.7 kg)   LMP 07/17/2019 (Exact Date) Comment: urine preg.pending,06/16/19  BMI 40.85 kg/m  CONSTITUTIONAL: Well-developed, well-nourished female in no acute distress.  NEUROLOGIC: Alert and oriented to person, place, and time. Normal reflexes, muscle tone coordination. No cranial nerve deficit noted. PSYCHIATRIC: Normal mood and affect. Normal behavior. Normal judgment and thought content. SKIN: Skin is warm and dry. No rash noted. Not diaphoretic. No erythema. No  pallor. HENT:  Normocephalic, atraumatic, External right and left ear normal. Oropharynx is clear and moist EYES: Conjunctivae and EOM are normal. Pupils are equal, round, and reactive to light. No scleral icterus.  NECK: Normal range of motion, supple, no masses CARDIOVASCULAR: Normal heart rate noted, regular rhythm RESPIRATORY: Effort and breath sounds normal, no problems with respiration noted BREASTS:deferred ABDOMEN: Soft,  nontender, nondistended, gravid, obese. GU: normal appearing external female genitalia,normal appearing cervix, scant white discharge in vagina, no lesions noted, pap taken without incident Bimanual: 10 weeks sized uterus, no adnexal tenderness or palpable lesions noted MUSCULOSKELETAL: Normal range of motion. EXT:  No edema and no tenderness. 2+ distal pulses.   Assessment and Plan:  Pregnancy: G2P1001 at 16w4dby sure LMP  1. Supervision of high risk pregnancy, antepartum Pt seen, complete physical performed, genetic screening will be drawn at later date due to inability to get good blood draw - Genetic Screening - Culture, OB Urine - CBC/D/Plt+RPR+Rh+ABO+Rub Ab... - Hemoglobin A1c - Cytology - PAP( [place]) - Comprehensive metabolic panel - Protein / creatinine ratio, urine - TSH  2. Chronic hypertension affecting pregnancy Pt was taking labetalol 100 mg po daily but was not taking her BP at home, she now has a BP cuff, advised BID dosing , new rx given - Protein / creatinine ratio, urine  3. Gastroesophageal reflux disease, unspecified whether esophagitis present Pt using protonix for relief  4. 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 Type 1 diabetes mellitus with hyperglycemia (HCC)   6. Type 1 diabetes mellitus with diabetic polyneuropathy (HB[place]   7.  Preexisting diabetes complicating pregnancy, antepartum Pt advised to start bringing in blood sugars for provider to review  8. BMI 40.0-44.9, adult (HWight[place]   9. Tobacco abuse Pt notes 1 ppd/tobacco, strongly advised to decrease use  Preterm labor symptoms and general obstetric precautions including but not limited to vaginal bleeding, contractions, leaking of fluid and fetal movement were reviewed in detail with the patient.  Please refer to After Visit Summary for other counseling recommendations.   Return in about 3 weeks (around 10/27/2019) [facility].  LGriffin Basil9/15/2021 4:17 PM

## 2019-10-06 NOTE — Patient Instructions (Signed)
Type 1 or Type 2 Diabetes Mellitus During Pregnancy, Self Care When you have type 1 or type 2 diabetes (diabetes mellitus), you must keep your blood sugar (glucose) in a healthy range. You can do this with:  Nutrition.  Exercise.  Lifestyle changes.  Insulin or medicines, if needed.  Support from your doctors and others. If diabetes is treated, it is unlikely to cause problems for the mother or baby. If it is not treated, it may cause problems that can be harmful to the mother and baby. How to stay aware of blood sugar   Check your blood sugar every day, as often as told.  Call your doctor if your blood sugar is above your goal numbers for 2 tests in a row.  Have your A1c (hemoglobin A1c) level checked at least two times a year. Have it checked more often if your doctor tells you to do that. Your doctor will set personal treatment goals for you. In general, you should have these blood sugar levels:  After not eating for a long time (fasting): 95 mg/dL (5.3 mmol/L).  After meals (postprandial): ? One hour after a meal: at or below 140 mg/dL (7.8 mmol/L). ? Two hours after a meal: at or below 120 mg/dL (6.7 mmol/L).  A1c level: 6-6.5%. How to manage high and low blood sugar Signs of high blood sugar High blood sugar is called hyperglycemia. Know the early signs of high blood sugar. Signs may include:  Feeling: ? Thirsty. ? Hungry. ? Very tired.  Needing to pee (urinate) more than usual.  Blurry vision. Signs of low blood sugar Low blood sugar is called hypoglycemia. This is when blood sugar is at or below 70 mg/dL (3.9 mmol/L). Signs may include:  Feeling: ? Hungry. ? Worried or nervous (anxious). ? Sweaty and clammy. ? Confused. ? Dizzy. ? Sleepy. ? Sick to your stomach (nauseous).  Having: ? A fast heartbeat. ? A headache. ? A change in your vision. ? Tingling or no feeling (numbness) around your mouth, lips, or tongue. ? Jerky movements that you cannot  control (seizure).  Having trouble with: ? Moving (coordination). ? Sleeping. ? Passing out (fainting). ? Getting upset easily (irritability). Treating low blood sugar To treat low blood sugar, eat or drink something sugary right away. If you can think clearly and swallow safely, follow the 15:15 rule:  Take 15 grams of a fast-acting carb (carbohydrate). Some fast-acting carbs are: ? 1 tube of glucose gel. ? 3 sugar tablets (glucose pills). ? 6-8 pieces of hard candy. ? 4 oz (120 mL) of fruit juice. ? 4 oz (120 mL) of regular (not diet) soda.  Check your blood sugar 15 minutes after you take the carb.  If your blood sugar is still at or below 70 mg/dL (3.9 mmol/L), take 15 grams of a carb again.  If your blood sugar does not go above 70 mg/dL (3.9 mmol/L) after 3 tries, get help right away.  After your blood sugar goes back to normal, eat a meal or a snack within 1 hour. Treating very low blood sugar If your blood sugar is at or below 54 mg/dL (3 mmol/L), you have very low blood sugar (severe hypoglycemia). This is an emergency. Do not wait to see if the symptoms will go away. Get medical help right away. Call your local emergency services (911 in the [place] If you have very low blood sugar and you cannot eat or drink, you may need a glucagon shot (injection). A   family member or friend should learn how to check your blood sugar and how to give you a glucagon shot. Ask your doctor if you need to have a glucagon shot kit at home. Follow these instructions at home: Medicine  Take insulin and diabetes medicines as told.  If your doctor says you should take more or less insulin and medicines, do this exactly as told.  Do not run out of insulin or medicines. Having diabetes can put you at risk for other long-term conditions. These include heart disease and kidney disease. Your doctor may prescribe medicines to prevent these problems. Food   Make healthy food choices. These  include: ? Chicken, fish, egg whites, and beans. ? Oats, whole wheat, bulgur, brown rice, quinoa, and millet. ? Fresh fruits and vegetables. ? Low-fat dairy products. ? Nuts, avocado, olive oil, and canola oil.  Meet with a food specialist (dietitian). He or she can help you make an eating plan that is right for you.  Follow instructions from your doctor about what you cannot eat or drink.  Drink enough fluid to keep your pee (urine) pale yellow.  Eat healthy snacks between healthy meals.  Keep track of the carbs you eat. Do this by reading food labels and learning food serving sizes.  Follow your sick day plan when you cannot eat or drink normally. Make this plan with your doctor so it is ready to use. Activity  Exercise for 30 minutes or more a day during your pregnancy or as much as told by your doctor.  Talk with your doctor before you start a new exercise or activity. Your doctor may need to tell you to change: ? How much insulin or medicines you take. ? How much food you eat. Lifestyle  Do not drink alcohol.  Do not use any tobacco products, such as cigarettes, chewing tobacco, and e-cigarettes. If you need help quitting, ask your doctor.  Learn how to deal with stress. If you need help with this, ask your doctor. Body care  Stay up to date with your shots (immunizations).  Get an eye exam during your first trimester.  Check your skin and feet every day. Check for cuts, bruises, redness, blisters, or sores.  Get regular foot exams as told by your doctor.  Brush your teeth and gums two times a day. Floss one or more times a day.  Go to the dentist one or more times every 6 months.  Stay at a healthy weight during your pregnancy. General instructions  Take over-the-counter and prescription medicines only as told by your doctor.  Talk with your doctor about your risk for high blood pressure during pregnancy (preeclampsia or eclampsia).  Share your diabetes care  plan with: ? Your work or school. ? People you live with.  Check your pee for ketones: ? When you are sick. ? As told by your doctor.  Carry a card or wear jewelry that says that you have diabetes.  Keep all follow-up visits with your doctor. This is important. Questions to ask your doctor  Do I need to meet with a diabetes educator?  Where can I find a support group for people with diabetes? Where to find more information To learn more about diabetes, visit:  American Diabetes Association: www.diabetes.org  American Association of Diabetes Educators (AADE): www.diabeteseducator.org Summary  When you have type 1 or type 2 diabetes (diabetes mellitus), you must keep your blood sugar (glucose) in a healthy range.  Check your blood sugar every  day, as often as told.  Take insulin and diabetes medicines as told.  Keep all follow-up visits as told by your doctor. This is important. This information is not intended to replace advice given to you by your health care provider. Make sure you discuss any questions you have with your health care provider. Document Revised: 04/28/2018 Document Reviewed: 02/10/2015 Elsevier Patient Education  2020 Elsevier Inc.  

## 2019-10-07 LAB — CBC/D/PLT+RPR+RH+ABO+RUB AB...
Antibody Screen: NEGATIVE
Basophils Absolute: 0.1 10*3/uL (ref 0.0–0.2)
Basos: 1 %
EOS (ABSOLUTE): 0.1 10*3/uL (ref 0.0–0.4)
Eos: 1 %
HCV Ab: <0.1 s/co ratio (ref 0.0–0.9)
HIV Screen 4th Generation wRfx: NONREACTIVE
Hematocrit: 36.7 % (ref 34.0–46.6)
Hemoglobin: 12 g/dL (ref 11.1–15.9)
Hepatitis B Surface Ag: NEGATIVE
Immature Grans (Abs): 0 10*3/uL (ref 0.0–0.1)
Immature Granulocytes: 0 %
Lymphocytes Absolute: 3 10*3/uL (ref 0.7–3.1)
Lymphs: 25 %
MCH: 27.1 pg (ref 26.6–33.0)
MCHC: 32.7 g/dL (ref 31.5–35.7)
MCV: 83 fL (ref 79–97)
Monocytes Absolute: 1.1 10*3/uL — ABNORMAL HIGH (ref 0.1–0.9)
Monocytes: 9 %
Neutrophils Absolute: 7.5 10*3/uL — ABNORMAL HIGH (ref 1.4–7.0)
Neutrophils: 64 %
Platelets: 241 10*3/uL (ref 150–450)
RBC: 4.42 x10E6/uL (ref 3.77–5.28)
RDW: 16.8 % — ABNORMAL HIGH (ref 11.7–15.4)
RPR Ser Ql: NONREACTIVE
Rh Factor: POSITIVE
Rubella Antibodies, IGG: 3.55 index (ref >0.99)
WBC: 11.7 10*3/uL — ABNORMAL HIGH (ref 3.4–10.8)

## 2019-10-07 LAB — COMPREHENSIVE METABOLIC PANEL
ALT: 30 IU/L (ref 0–32)
AST: 16 IU/L (ref 0–40)
Albumin/Globulin Ratio: 1.5 (ref 1.2–2.2)
Albumin: 4.1 g/dL (ref 3.8–4.8)
Alkaline Phosphatase: 100 IU/L (ref 44–121)
BUN/Creatinine Ratio: 18 (ref 9–23)
BUN: 12 mg/dL (ref 6–20)
Bilirubin Total: <0.2 mg/dL (ref 0.0–1.2)
CO2: 19 mmol/L — ABNORMAL LOW (ref 20–29)
Calcium: 9.8 mg/dL (ref 8.7–10.2)
Chloride: 103 mmol/L (ref 96–106)
Creatinine, Ser: 0.68 mg/dL (ref 0.57–1.00)
GFR calc Af Amer: 135 mL/min/{1.73_m2} (ref >59)
GFR calc non Af Amer: 117 mL/min/{1.73_m2} (ref >59)
Globulin, Total: 2.8 g/dL (ref 1.5–4.5)
Glucose: 52 mg/dL — ABNORMAL LOW (ref 65–99)
Potassium: 4.4 mmol/L (ref 3.5–5.2)
Sodium: 135 mmol/L (ref 134–144)
Total Protein: 6.9 g/dL (ref 6.0–8.5)

## 2019-10-07 LAB — GC/CHLAMYDIA PROBE AMP (~~LOC~~) NOT AT ARMC
Chlamydia: NEGATIVE
Comment: NEGATIVE
Comment: NORMAL
Neisseria Gonorrhea: NEGATIVE

## 2019-10-07 LAB — TSH: TSH: 0.776 u[IU]/mL (ref 0.450–4.500)

## 2019-10-07 LAB — HEMOGLOBIN A1C
Est. average glucose Bld gHb Est-mCnc: 160 mg/dL
Hgb A1c MFr Bld: 7.2 % — ABNORMAL HIGH (ref 4.8–5.6)

## 2019-10-07 LAB — PROTEIN / CREATININE RATIO, URINE
Creatinine, Urine: 124.9 mg/dL
Protein, Ur: 14 mg/dL
Protein/Creat Ratio: 112 mg/g creat (ref 0–200)

## 2019-10-07 LAB — HCV INTERPRETATION

## 2019-10-08 ENCOUNTER — Telehealth (INDEPENDENT_AMBULATORY_CARE_PROVIDER_SITE_OTHER): Payer: Medicaid Other

## 2019-10-08 DIAGNOSIS — O10919 Unspecified pre-existing hypertension complicating pregnancy, unspecified trimester: Secondary | ICD-10-CM

## 2019-10-08 LAB — CYTOLOGY - PAP
Chlamydia: NEGATIVE
Comment: NEGATIVE
Comment: NEGATIVE
Comment: NORMAL
Diagnosis: NEGATIVE
High risk HPV: NEGATIVE
Neisseria Gonorrhea: NEGATIVE

## 2019-10-08 NOTE — Telephone Encounter (Signed)
[employer] called with elevated BP alert of 148/85. No retakes or symptoms reported.

## 2019-10-08 NOTE — Telephone Encounter (Signed)
Called patient due to [employer] alert last night. Patient reports that she saw the OB that her Labetalol was increased, she has not picked up from the pharmacy today.   She did take a pill last night to increase to BID and took the BP right after she took her Medication and as horsing around with her BF before she took it. Enc patient to sit quietly for 15 minutes prior to taking in the future.   She has some headaches that improve with food. She denies blurred vision or dizziness. She has no questions or concerns at this time.

## 2019-10-11 ENCOUNTER — Other Ambulatory Visit: Payer: Medicaid Other

## 2019-10-11 ENCOUNTER — Other Ambulatory Visit: Payer: Self-pay

## 2019-10-12 ENCOUNTER — Telehealth: Payer: Self-pay | Admitting: Podiatry

## 2019-10-12 NOTE — Telephone Encounter (Signed)
Thanks for the update. If needed we can provide her with a prescription for the inserts.

## 2019-10-12 NOTE — Telephone Encounter (Signed)
Pt called wanting a new rx to go to [facility] for diabetic shoes and inserts. She has not been seen in a while and explained she would need and appt to get the new rx but I don't think [facility] is still doing the diabetic shoes and inserts. She then said she believes her insurance changed to medicaid family planning since she was pregnant. I explained that we are not able to except that insurance as it is for family planning only. I told pt to call pcp and see if they would give her a rx to go to [facility] as they do diabetic shoes and inserts. If any issues with pcp to call us back. I gave pt number to [facility] as well. I also told her if she finds out she does not have family planning we can get her in if needed.

## 2019-10-13 LAB — CULTURE, OB URINE

## 2019-10-13 LAB — URINE CULTURE, OB REFLEX

## 2019-10-21 ENCOUNTER — Encounter: Payer: Self-pay | Admitting: *Deleted

## 2019-10-25 ENCOUNTER — Telehealth (INDEPENDENT_AMBULATORY_CARE_PROVIDER_SITE_OTHER): Payer: Medicaid Other

## 2019-10-25 DIAGNOSIS — O10919 Unspecified pre-existing hypertension complicating pregnancy, unspecified trimester: Secondary | ICD-10-CM

## 2019-10-25 NOTE — Telephone Encounter (Signed)
Pt retook BP. Alert sent by [employer], BP is 155/88.  Reviewed pt history and recent BP values with Earlene Plater, MD who recommends pt continue current plan of care and follow up at office on 10/6 unless there is increased vision changes or headache severity. Called pt; VM left stating I am calling to follow up on BP recheck. MyChart message sent.

## 2019-10-25 NOTE — Telephone Encounter (Signed)
Alert received from [employer] with elevated BP of 151/84; no retakes or symptoms reported. Per chart review pt has history of chronic hypertension. Called pt. Pt states this was taken last night and she has not checked since. Reports taking labetalol 100 mg BID as prescribed. Reports history of headaches that come and go. Reports some vision changes since diabetes diagnosis. Encouraged pt to check BP today and log into [employer]ys[employer]s. Pt will follow up with provider at appt on 10/27/19.

## 2019-10-27 ENCOUNTER — Other Ambulatory Visit: Payer: Self-pay

## 2019-10-27 ENCOUNTER — Ambulatory Visit (INDEPENDENT_AMBULATORY_CARE_PROVIDER_SITE_OTHER): Payer: Medicaid Other | Admitting: Obstetrics and Gynecology

## 2019-10-27 ENCOUNTER — Telehealth (INDEPENDENT_AMBULATORY_CARE_PROVIDER_SITE_OTHER): Payer: Medicaid Other | Admitting: Obstetrics and Gynecology

## 2019-10-27 ENCOUNTER — Encounter: Payer: Self-pay | Admitting: Obstetrics and Gynecology

## 2019-10-27 VITALS — BP 132/72 | HR 87 | Wt 276.9 lb

## 2019-10-27 DIAGNOSIS — O0990 Supervision of high risk pregnancy, unspecified, unspecified trimester: Secondary | ICD-10-CM

## 2019-10-27 DIAGNOSIS — O24319 Unspecified pre-existing diabetes mellitus in pregnancy, unspecified trimester: Secondary | ICD-10-CM

## 2019-10-27 DIAGNOSIS — O10919 Unspecified pre-existing hypertension complicating pregnancy, unspecified trimester: Secondary | ICD-10-CM

## 2019-10-27 DIAGNOSIS — O34219 Maternal care for unspecified type scar from previous cesarean delivery: Secondary | ICD-10-CM | POA: Insufficient documentation

## 2019-10-27 DIAGNOSIS — Z98891 History of uterine scar from previous surgery: Secondary | ICD-10-CM

## 2019-10-27 MED ORDER — LABETALOL HCL 200 MG PO TABS: 200.0000 mg | ORAL_TABLET | Freq: Two times a day (BID) | ORAL | 3 refills | Status: DC

## 2019-10-27 MED ORDER — ASPIRIN EC 81 MG PO TBEC: 81.0000 mg | DELAYED_RELEASE_TABLET | Freq: Every day | ORAL | 2 refills | Status: DC

## 2019-10-27 MED ORDER — PROMETHAZINE HCL 25 MG PO TABS: 25.0000 mg | ORAL_TABLET | Freq: Three times a day (TID) | ORAL | 2 refills | Status: DC | PRN

## 2019-10-27 NOTE — Telephone Encounter (Signed)
Returned patients call. Patient reports she called an second time and was told to go ahead and take the medication again. She has no further questions or concerns and plans to come for her appointment this afternoon.

## 2019-10-27 NOTE — Patient Instructions (Signed)

## 2019-10-27 NOTE — Telephone Encounter (Signed)
Patient threw up this morning after taking her medication. She wants to know if she can take more.

## 2019-11-01 ENCOUNTER — Other Ambulatory Visit: Payer: Self-pay | Admitting: Gastroenterology

## 2019-11-01 DIAGNOSIS — K219 Gastro-esophageal reflux disease without esophagitis: Secondary | ICD-10-CM

## 2019-11-10 ENCOUNTER — Encounter: Payer: Self-pay | Admitting: *Deleted

## 2019-11-22 ENCOUNTER — Encounter: Payer: Medicaid Other | Admitting: Obstetrics and Gynecology

## 2019-11-24 ENCOUNTER — Encounter: Payer: Self-pay | Admitting: *Deleted

## 2019-11-29 ENCOUNTER — Ambulatory Visit: Payer: Medicaid Other | Admitting: *Deleted

## 2019-11-29 ENCOUNTER — Ambulatory Visit (INDEPENDENT_AMBULATORY_CARE_PROVIDER_SITE_OTHER): Payer: Medicaid Other | Admitting: Obstetrics & Gynecology

## 2019-11-29 ENCOUNTER — Other Ambulatory Visit: Payer: Self-pay

## 2019-11-29 ENCOUNTER — Other Ambulatory Visit: Payer: Self-pay | Admitting: *Deleted

## 2019-11-29 ENCOUNTER — Ambulatory Visit: Payer: Medicaid Other | Attending: Obstetrics and Gynecology

## 2019-11-29 ENCOUNTER — Encounter: Payer: Self-pay | Admitting: *Deleted

## 2019-11-29 VITALS — BP 138/87 | HR 85 | Temp 98.0°F | Wt 280.0 lb

## 2019-11-29 DIAGNOSIS — O24319 Unspecified pre-existing diabetes mellitus in pregnancy, unspecified trimester: Secondary | ICD-10-CM

## 2019-11-29 DIAGNOSIS — O24912 Unspecified diabetes mellitus in pregnancy, second trimester: Secondary | ICD-10-CM

## 2019-11-29 DIAGNOSIS — O0990 Supervision of high risk pregnancy, unspecified, unspecified trimester: Secondary | ICD-10-CM | POA: Diagnosis present

## 2019-11-29 DIAGNOSIS — O10919 Unspecified pre-existing hypertension complicating pregnancy, unspecified trimester: Secondary | ICD-10-CM

## 2019-11-29 NOTE — Patient Instructions (Signed)

## 2019-11-29 NOTE — Progress Notes (Addendum)
PRENATAL VISIT NOTE  Subjective:  Molly Savage is a 29 y.o. G2P1001 at [redacted]w[redacted]d being seen today for ongoing prenatal care.  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); Tobacco abuse; Diabetic foot infection (HCC); BMI 40.0-44.9, adult (HCC); Situational mixed anxiety and depressive disorder; Depression; Chronic hypertension; Lactose intolerance; Morbid obesity (HCC); Callus of foot; Patellofemoral arthralgia of both knees; Left foot pain; Skin lesion of left leg; Granuloma annulare; Poor memory; Allergies; Increased ammonia level; Type 1 diabetes mellitus with hyperglycemia (HCC); Gastroesophageal reflux disease; Type 1 diabetes mellitus with diabetic polyneuropathy (HCC); Supervision of high risk pregnancy, antepartum; Preexisting diabetes complicating pregnancy, antepartum; Chronic hypertension affecting pregnancy; and H/O: cesarean section on their problem list.  Patient reports no complaints.  Contractions: Not present. Vag. Bleeding: None.  Movement: Absent. Denies leaking of fluid.   Pt reports that she has had no problems or complaints thus far in her pregnancy. She denies taking her BP at home since her last visit in the office and notes that she forgot to take her Labetalol this AM.  The following portions of the patient's history were reviewed and updated as appropriate: allergies, current medications, past family history, past medical history, past social history, past surgical history and problem list.   Objective:   Vitals:   11/29/19 1507  BP: 138/87  Pulse: 85  Temp: 98 F (36.7 C)  Weight: 280 lb (127 kg)    Fetal Status: Fetal Heart Rate (bpm): 150   Movement: Absent     General:  Alert, oriented and cooperative. Patient is in no acute distress.  Skin: Skin is warm and dry. No rash noted.   Cardiovascular: Normal heart rate noted  Respiratory: Normal respiratory effort, no  problems with respiration noted  Abdomen: Soft, gravid, appropriate for gestational age. Fundus at approximately the level of the umbilicus.   Pain/Pressure: Abscent     Pelvic: Cervical exam deferred        Extremities: Normal range of motion.  Edema: None  Mental Status: Normal mood and affect. Normal behavior. Normal judgment and thought content.   Assessment and Plan:  Pregnancy: G2P1001 at [redacted]w[redacted]d 1. Chronic hypertension affecting pregnancy Pt reports not taking her blood pressures at home since her last visit. She forgot to take her labetalol this AM. BP mildly elevated today at 138/87. -Pt has been set up with [employer] and encouraged to record her blood pressure for daily monitoring. -Continue Labetalol 200 mg BID  2. Preexisting diabetes complicating pregnancy, antepartum Pt uses insulin pump and dexcom glucose monitor. Recent blood glucose levels fasting have been in the 60s-90s with occasional measurements in the 50s. These correct eventually after eating food. She has several readings in the afternoon over 140. -Reports f/u with endocrinologist in January -Close monitoring with f/u appointment in ~3 weeks -Will schedule fetal echocardiogram  3. Supervision of high risk pregnancy, antepartum Pt is doing well and progressing well in her pregnancy. Genetic screening was performed and negative.   Preterm labor symptoms and general obstetric precautions including but not limited to vaginal bleeding, contractions, leaking of fluid and fetal movement were reviewed in detail with the patient. Please refer to After Visit Summary for other counseling recommendations.   Return in about 3 weeks (around 12/20/2019).  Future Appointments  Date Time Provider Department Center  12/27/2019  9:00 AM [facility] NURSE [facility]  12/27/2019  9:15 AM [facility] US2 [facility]  Nonah Mattes, Medical Student   Attestation of Attending Supervision of Medical Student: Evaluation and  management procedures were performed by the medical student under my supervision and collaboration.  I have reviewed the student's note and chart, and I agree with the management and plan.  Scheryl Darter, MD, FACOG Attending Obstetrician & Gynecologist Faculty Practice, [employer]

## 2019-12-06 ENCOUNTER — Telehealth: Payer: Self-pay

## 2019-12-06 NOTE — Telephone Encounter (Signed)
Contacted pt about logging in her blood sugars into [employer]s.  Pt reports that she just started logging in her sugars this week.  She states that she has the continous monitoring and did not know how to log.  I advised pt to put her blood sugars in when she wakes up and then two hours after every meal.  Pt verbalized understanding with no further questions.  Addison Naegeli, RN  12/06/19

## 2019-12-21 ENCOUNTER — Other Ambulatory Visit: Payer: Self-pay | Admitting: Obstetrics and Gynecology

## 2019-12-21 DIAGNOSIS — O0990 Supervision of high risk pregnancy, unspecified, unspecified trimester: Secondary | ICD-10-CM

## 2019-12-27 ENCOUNTER — Ambulatory Visit: Payer: Medicaid Other

## 2019-12-27 ENCOUNTER — Ambulatory Visit: Payer: Medicaid Other | Attending: Obstetrics and Gynecology

## 2019-12-28 ENCOUNTER — Telehealth: Payer: Self-pay

## 2019-12-28 ENCOUNTER — Telehealth: Payer: Self-pay | Admitting: Obstetrics & Gynecology

## 2019-12-28 DIAGNOSIS — O0990 Supervision of high risk pregnancy, unspecified, unspecified trimester: Secondary | ICD-10-CM

## 2019-12-28 MED ORDER — PROMETHAZINE HCL 25 MG PO TABS: 25.0000 mg | ORAL_TABLET | Freq: Three times a day (TID) | ORAL | 0 refills | Status: DC | PRN

## 2019-12-28 NOTE — Telephone Encounter (Signed)
Pt called stating her OB/GYN is refusing to refill Phenergan for her.  Pt's PCP has not been the prescriber of this medication and I advised pt of this.  Pt wanted to know if she could see Dr. Carmelia Roller today for this medication.  I advised pt to contact her OB/GYN to find out why they are not willing to refill before proceeding with her PCP since there are no openings today and pt said she could not come in/do VOV until next Monday.

## 2019-12-28 NOTE — Telephone Encounter (Signed)
Called patient stating I am returning her phone call. Patient states that her pharmacy told her we refused her phenergan refill and that is the only thing that helps her not to vomit. Told patient there must have be some confusion between [place] and the pharmacy. Informed patient of refill #30 phenergan. Patient asked about getting more and I discussed she mention that to the provider at her next appt. Per chart review, patient doesn't have an appt scheduled. Discussed with patient someone from the front office would reach out to her with an appt. Patient verbalized understanding.

## 2019-12-28 NOTE — Telephone Encounter (Signed)
Pt called and stated that her prescription for nausea was denied and she needs to know why or what other option, pt request a call back ASAP. Thank you

## 2019-12-31 ENCOUNTER — Ambulatory Visit (INDEPENDENT_AMBULATORY_CARE_PROVIDER_SITE_OTHER): Payer: Medicaid Other | Admitting: Obstetrics & Gynecology

## 2019-12-31 VITALS — BP 125/73 | HR 85 | Wt 281.1 lb

## 2019-12-31 DIAGNOSIS — Z98891 History of uterine scar from previous surgery: Secondary | ICD-10-CM

## 2019-12-31 DIAGNOSIS — Z3A23 23 weeks gestation of pregnancy: Secondary | ICD-10-CM

## 2019-12-31 DIAGNOSIS — Z6841 Body Mass Index (BMI) 40.0 and over, adult: Secondary | ICD-10-CM

## 2019-12-31 DIAGNOSIS — O10919 Unspecified pre-existing hypertension complicating pregnancy, unspecified trimester: Secondary | ICD-10-CM

## 2019-12-31 DIAGNOSIS — E1065 Type 1 diabetes mellitus with hyperglycemia: Secondary | ICD-10-CM

## 2019-12-31 DIAGNOSIS — O0990 Supervision of high risk pregnancy, unspecified, unspecified trimester: Secondary | ICD-10-CM

## 2019-12-31 LAB — POCT URINALYSIS DIP (DEVICE)
Bilirubin Urine: NEGATIVE
Glucose, UA: NEGATIVE mg/dL
Ketones, ur: NEGATIVE mg/dL
Leukocytes,Ua: NEGATIVE
Nitrite: NEGATIVE
Protein, ur: 30 mg/dL — AB
Specific Gravity, Urine: 1.02 (ref 1.005–1.030)
Urobilinogen, UA: 0.2 mg/dL (ref 0.0–1.0)
pH: 7 (ref 5.0–8.0)

## 2019-12-31 MED ORDER — OMEPRAZOLE 40 MG PO CPDR: 40.0000 mg | DELAYED_RELEASE_CAPSULE | Freq: Every day | ORAL | Status: DC

## 2019-12-31 NOTE — Progress Notes (Addendum)
   PRENATAL VISIT NOTE  Subjective:  Molly Savage is a 29 y.o. G2P1001 at [redacted]w[redacted]d being seen today for ongoing prenatal care.  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); Tobacco abuse; Diabetic foot infection (HCC); BMI 40.0-44.9, adult (HCC); Situational mixed anxiety and depressive disorder; Depression; Chronic hypertension; Lactose intolerance; Morbid obesity (HCC); Callus of foot; Patellofemoral arthralgia of both knees; Left foot pain; Skin lesion of left leg; Granuloma annulare; Poor memory; Allergies; Increased ammonia level; Type 1 diabetes mellitus with hyperglycemia (HCC); Gastroesophageal reflux disease; Type 1 diabetes mellitus with diabetic polyneuropathy (HCC); Supervision of high risk pregnancy, antepartum; Preexisting diabetes complicating pregnancy, antepartum; Chronic hypertension affecting pregnancy; and H/O: cesarean section on their problem list.  Patient reports fetal movement.  She is having increased fatigue..  Contractions: Not present. Vag. Bleeding: None.  Movement: Present. Denies leaking of fluid.   Pt cannot given any recent values for BS except a 250 this morning (after eating a cookie) but monitor reads 109.  Pt reports fasting can be up to 150.  We do not have ability to download BS from monitor.  Has endocrinology appt 01/04/2020.  States she doesn't see the point in "seeing all these doctors".  Reviewed pregnancy risks for mother and fetus with uncontrolled diabetes and importance of follow-up especially for better monitoring of blood sugars.  The following portions of the patient's history were reviewed and updated as appropriate: allergies, current medications, past family history, past medical history, past social history, past surgical history and problem list.   Objective:   Vitals:   12/31/19 0900  BP: 125/73  Pulse: 85  Weight: 281 lb 1.6 oz (127.5 kg)     Fetal Status: Fetal Heart Rate (bpm): 136   Movement: Present     General:  Alert, oriented and cooperative. Patient is in no acute distress.  Skin: Skin is warm and dry. No rash noted.   Cardiovascular: Normal heart rate noted  Respiratory: Normal respiratory effort, no problems with respiration noted  Abdomen: Soft, gravid, appropriate for gestational age.  Pain/Pressure: Present     Pelvic: Cervical exam deferred        Extremities: Normal range of motion.  Edema: None  Mental Status: Normal mood and affect. Normal behavior. Normal judgment and thought content.   Assessment and Plan:  Pregnancy: G2P1001 at [redacted]w[redacted]d 1. [redacted] weeks gestation of pregnancy -AFP will be drawn today.  2. Supervision of high risk pregnancy, antepartum -Recheck 1 month - Growth scan will be scheduled ASAP -Pt never returned phone calls about fetal echo at [facility]  3. Chronic hypertension affecting pregnancy -continue baby ASA and labetolol  4. H/O: cesarean section -Desires Repeat c section  5. Type 1 diabetes mellitus with hyperglycemia (HCC) - Suspect poorly controlled.  Cannot read any of her BS from her monitor except her current which is 109. - Has appt 01/04/2020 at [facility].  Highly encouraged her to keep appointment.   - Log provided for pt to document so we can also follow blood sugars  6. BMI 40.0-44.9, adult (HCC)  7.  GERD  8. Smoking  Preterm labor symptoms and general obstetric precautions including but not limited to vaginal bleeding, contractions, leaking of fluid and fetal movement were reviewed in detail with the patient. Please refer to After Visit Summary for other counseling recommendations.    Jerene Bears, MD

## 2020-01-02 LAB — AFP, SERUM, OPEN SPINA BIFIDA
AFP MoM: 1.39
AFP Value: 88.4 ng/mL
Gest. Age on Collection Date: 23.6 weeks
Maternal Age At EDD: 31.5 yr
OSBR Risk 1 IN: 3704
Test Results:: NEGATIVE
Weight: 281 [lb_av]

## 2020-01-19 ENCOUNTER — Other Ambulatory Visit: Payer: Self-pay | Admitting: Lactation Services

## 2020-01-19 DIAGNOSIS — O0990 Supervision of high risk pregnancy, unspecified, unspecified trimester: Secondary | ICD-10-CM

## 2020-01-19 MED ORDER — PROMETHAZINE HCL 25 MG PO TABS: 25.0000 mg | ORAL_TABLET | Freq: Three times a day (TID) | ORAL | 0 refills | Status: DC | PRN

## 2020-01-25 ENCOUNTER — Telehealth: Payer: Medicaid Other | Admitting: Family Medicine

## 2020-01-27 ENCOUNTER — Encounter: Payer: Self-pay | Admitting: Family Medicine

## 2020-01-27 ENCOUNTER — Other Ambulatory Visit: Payer: Self-pay | Admitting: Women's Health

## 2020-01-27 ENCOUNTER — Other Ambulatory Visit: Payer: Self-pay

## 2020-01-27 ENCOUNTER — Other Ambulatory Visit: Payer: Self-pay | Admitting: Family Medicine

## 2020-01-27 ENCOUNTER — Telehealth (INDEPENDENT_AMBULATORY_CARE_PROVIDER_SITE_OTHER): Payer: Medicaid Other | Admitting: Family Medicine

## 2020-01-27 ENCOUNTER — Encounter (HOSPITAL_COMMUNITY): Payer: Self-pay | Admitting: Obstetrics and Gynecology

## 2020-01-27 ENCOUNTER — Inpatient Hospital Stay (HOSPITAL_COMMUNITY)
Admission: AD | Admit: 2020-01-27 | Discharge: 2020-01-27 | Disposition: A | Discharge status: Home / Self Care | Payer: Medicaid Other | Attending: Obstetrics and Gynecology | Admitting: Obstetrics and Gynecology

## 2020-01-27 VITALS — BP 157/93 | HR 88

## 2020-01-27 DIAGNOSIS — Z20822 Contact with and (suspected) exposure to covid-19: Secondary | ICD-10-CM | POA: Insufficient documentation

## 2020-01-27 DIAGNOSIS — O162 Unspecified maternal hypertension, second trimester: Secondary | ICD-10-CM

## 2020-01-27 DIAGNOSIS — O24012 Pre-existing diabetes mellitus, type 1, in pregnancy, second trimester: Secondary | ICD-10-CM

## 2020-01-27 DIAGNOSIS — O112 Pre-existing hypertension with pre-eclampsia, second trimester: Secondary | ICD-10-CM | POA: Diagnosis not present

## 2020-01-27 DIAGNOSIS — Z3A27 27 weeks gestation of pregnancy: Secondary | ICD-10-CM | POA: Diagnosis not present

## 2020-01-27 DIAGNOSIS — O1492 Unspecified pre-eclampsia, second trimester: Secondary | ICD-10-CM

## 2020-01-27 DIAGNOSIS — O10919 Unspecified pre-existing hypertension complicating pregnancy, unspecified trimester: Secondary | ICD-10-CM

## 2020-01-27 DIAGNOSIS — O99332 Smoking (tobacco) complicating pregnancy, second trimester: Secondary | ICD-10-CM | POA: Insufficient documentation

## 2020-01-27 DIAGNOSIS — Z3490 Encounter for supervision of normal pregnancy, unspecified, unspecified trimester: Secondary | ICD-10-CM | POA: Diagnosis not present

## 2020-01-27 DIAGNOSIS — E10319 Type 1 diabetes mellitus with unspecified diabetic retinopathy without macular edema: Secondary | ICD-10-CM

## 2020-01-27 DIAGNOSIS — O0992 Supervision of high risk pregnancy, unspecified, second trimester: Secondary | ICD-10-CM | POA: Diagnosis not present

## 2020-01-27 DIAGNOSIS — R030 Elevated blood-pressure reading, without diagnosis of hypertension: Secondary | ICD-10-CM | POA: Diagnosis present

## 2020-01-27 DIAGNOSIS — O24319 Unspecified pre-existing diabetes mellitus in pregnancy, unspecified trimester: Secondary | ICD-10-CM

## 2020-01-27 DIAGNOSIS — F17210 Nicotine dependence, cigarettes, uncomplicated: Secondary | ICD-10-CM | POA: Diagnosis not present

## 2020-01-27 DIAGNOSIS — O10012 Pre-existing essential hypertension complicating pregnancy, second trimester: Secondary | ICD-10-CM | POA: Diagnosis not present

## 2020-01-27 DIAGNOSIS — O1490 Unspecified pre-eclampsia, unspecified trimester: Secondary | ICD-10-CM

## 2020-01-27 DIAGNOSIS — K219 Gastro-esophageal reflux disease without esophagitis: Secondary | ICD-10-CM

## 2020-01-27 DIAGNOSIS — E109 Type 1 diabetes mellitus without complications: Secondary | ICD-10-CM

## 2020-01-27 DIAGNOSIS — Z3689 Encounter for other specified antenatal screening: Secondary | ICD-10-CM

## 2020-01-27 DIAGNOSIS — O0990 Supervision of high risk pregnancy, unspecified, unspecified trimester: Secondary | ICD-10-CM

## 2020-01-27 LAB — CBC
HCT: 27 % — ABNORMAL LOW (ref 36.0–46.0)
Hemoglobin: 8.8 g/dL — ABNORMAL LOW (ref 12.0–15.0)
MCH: 27.8 pg (ref 26.0–34.0)
MCHC: 32.6 g/dL (ref 30.0–36.0)
MCV: 85.4 fL (ref 80.0–100.0)
Platelets: 243 10*3/uL (ref 150–400)
RBC: 3.16 MIL/uL — ABNORMAL LOW (ref 3.87–5.11)
RDW: 14 % (ref 11.5–15.5)
WBC: 10.6 10*3/uL — ABNORMAL HIGH (ref 4.0–10.5)
nRBC: 0 % (ref 0.0–0.2)

## 2020-01-27 LAB — COMPREHENSIVE METABOLIC PANEL
ALT: 40 U/L (ref 0–44)
AST: 24 U/L (ref 15–41)
Albumin: 2.4 g/dL — ABNORMAL LOW (ref 3.5–5.0)
Alkaline Phosphatase: 104 U/L (ref 38–126)
Anion gap: 10 (ref 5–15)
BUN: 11 mg/dL (ref 6–20)
CO2: 19 mmol/L — ABNORMAL LOW (ref 22–32)
Calcium: 8.6 mg/dL — ABNORMAL LOW (ref 8.9–10.3)
Chloride: 107 mmol/L (ref 98–111)
Creatinine, Ser: 0.64 mg/dL (ref 0.44–1.00)
GFR, Estimated: >60 mL/min (ref >60)
Glucose, Bld: 189 mg/dL — ABNORMAL HIGH (ref 70–99)
Potassium: 3.6 mmol/L (ref 3.5–5.1)
Sodium: 136 mmol/L (ref 135–145)
Total Bilirubin: 0.7 mg/dL (ref 0.3–1.2)
Total Protein: 5.5 g/dL — ABNORMAL LOW (ref 6.5–8.1)

## 2020-01-27 LAB — URINALYSIS, ROUTINE W REFLEX MICROSCOPIC
Bilirubin Urine: NEGATIVE
Glucose, UA: 50 mg/dL — AB
Ketones, ur: NEGATIVE mg/dL
Leukocytes,Ua: NEGATIVE
Nitrite: NEGATIVE
Protein, ur: 100 mg/dL — AB
Specific Gravity, Urine: 1.026 (ref 1.005–1.030)
pH: 6 (ref 5.0–8.0)

## 2020-01-27 LAB — PROTEIN / CREATININE RATIO, URINE
Creatinine, Urine: 191.75 mg/dL
Protein Creatinine Ratio: 0.84 mg/mg{Cre} — ABNORMAL HIGH (ref 0.00–0.15)
Total Protein, Urine: 161 mg/dL

## 2020-01-27 LAB — RESP PANEL BY RT-PCR (RSV, FLU A&B, COVID)  RVPGX2
Influenza A by PCR: NEGATIVE
Influenza B by PCR: NEGATIVE
Resp Syncytial Virus by PCR: NEGATIVE
SARS Coronavirus 2 by RT PCR: NEGATIVE

## 2020-01-27 NOTE — MAU Note (Signed)
Pt has had  Nasal congestion and cough for about 1.5 weeks. Went [facility] and had negative covid and flu. Did not do chest xray due to pregnancy. Pt still c/o cough hard for her to sleep. Has some SOB (pt is a smoker and stated she has not been able to smoke much since she has been sick). Had a virtual visit today and her b/p was elevated(chronic HTN and on labetalol). and told to come in to get that evaluated. Good fetal movement felt. Reports she has had a headache off and on. Pt  Is type 1 diabetic.

## 2020-01-27 NOTE — MAU Note (Signed)
RN attempted to check pt in, pt on the phone and unable to answer RN questions. RN will reattempt to complete admission questions in a few minutes. Pt informed this may create a delay.

## 2020-01-27 NOTE — Progress Notes (Signed)
Feraheme orders entered.

## 2020-01-27 NOTE — MAU Provider Note (Signed)
History     CSN: [id number]  Arrival date and time: 01/27/20 1403   Event Date/Time   First Provider Initiated Contact with Patient 01/27/20 1559      Chief Complaint  Patient presents with  . Hypertension   Ms. Molly Savage is a 30 y.o. G2P1001 at 46w5dwho presents to MAU for preeclampsia evaluation after she was seen by her PCP today for a virtual visit for URI symptoms and told the provider that she had elevated blood pressure. No specific reading was noted. Patient was advised to come to MAU and be seen for preeclampsia evaluation. Patient reports her only symptom at this time is a cough.  Pt denies HA, blurry vision/seeing spots, N/V, epigastric pain, swelling in face and hands, sudden weight gain. Pt denies chest pain and SOB.  Pt denies constipation, diarrhea, or urinary problems. Pt denies fever, chills, fatigue, sweating or changes in appetite. Pt denies dizziness, light-headedness, weakness.  Pt denies VB, ctx, LOF and reports good FM.   OB History    Gravida  2   Para  1   Term  1   Preterm      AB      Living  1     SAB  0   IAB  0   Ectopic  0   Multiple  0   Live Births  1           Past Medical History:  Diagnosis Date  . Anxiety   . Depression   . Diabetes mellitus type 1 ([facility]    Follows with Endo  . GERD (gastroesophageal reflux disease)   . Hypertension   . Tobacco abuse   . Wears glasses     Past Surgical History:  Procedure Laterality Date  . CESAREAN SECTION N/A 02/01/2016   Procedure: CESAREAN SECTION;  Surgeon: PMora Bellman MD;  [name]  Service: Obstetrics;  Laterality: N/A;  . CESAREAN SECTION    . CHOLECYSTECTOMY N/A 06/16/2019   Procedure: LAPAROSCOPIC CHOLECYSTECTOMY;  Surgeon: Kinsinger, LArta Bruce MD;  Location: [facility];  Service: General;  Laterality: N/A;  . FOOT SURGERY Left   . TOOTH EXTRACTION  2018    Family History  Problem Relation Age of Onset  . Hypertension Mother    . Depression Mother   . Bipolar disorder Sister   . Drug abuse Sister   . Lupus Brother   . Heart disease Maternal Uncle   . ADD / ADHD Brother   . Sleep apnea Brother   . Colon cancer Neg Hx   . Esophageal cancer Neg Hx   . Rectal cancer Neg Hx   . Stomach cancer Neg Hx     Social History   Tobacco Use  . Smoking status: Current Every Day Smoker    Packs/day: 0.50    Years: 12.00    Pack years: 6.00    Types: Cigarettes  . Smokeless tobacco: Never Used  . Tobacco comment: trying to cut back   Vaping Use  . Vaping Use: Never used  Substance Use Topics  . Alcohol use: Not Currently    Comment: occasionally  . Drug use: No    Allergies: No Known Allergies  No medications prior to admission.    Review of Systems  Constitutional: Negative for chills, diaphoresis, fatigue and fever.  Eyes: Negative for visual disturbance.  Respiratory: Positive for cough. Negative for shortness of breath.   Cardiovascular: Negative for chest pain.  Gastrointestinal: Negative for  abdominal pain, constipation, diarrhea, nausea and vomiting.  Genitourinary: Negative for dysuria, flank pain, frequency, pelvic pain, urgency, vaginal bleeding and vaginal discharge.  Neurological: Negative for dizziness, weakness, light-headedness and headaches.   Physical Exam   Blood pressure 128/65, pulse 86, temperature 98.4 F (36.9 C), resp. rate 18, height 5' 8.5" (1.74 m), last menstrual period 07/17/2019, SpO2 98 %.  Patient Vitals for the past 24 hrs:  BP Temp Pulse Resp SpO2 Height  01/27/20 1746 128/65 - 86 - - -  01/27/20 1745 - - - - 98 % -  01/27/20 1730 (!) 132/57 - 83 - 98 % -  01/27/20 1715 134/63 - 85 - 100 % -  01/27/20 1710 (!) 139/48 - 93 - 100 % -  01/27/20 1531 127/67 - 90 - - -  01/27/20 1516 (!) 133/109 - 86 - - -  01/27/20 1501 (!) 124/51 - 87 - - -  01/27/20 1447 136/65 - 97 - - -  01/27/20 1423 130/65 98.4 F (36.9 C) 96 18 98 % 5' 8.5" (1.74 m)   Physical  Exam Vitals and nursing note reviewed.  Constitutional:      General: She is not in acute distress.    Appearance: Normal appearance. She is not ill-appearing, toxic-appearing or diaphoretic.  HENT:     Head: Normocephalic and atraumatic.  Pulmonary:     Effort: Pulmonary effort is normal.  Neurological:     Mental Status: She is alert and oriented to person, place, and time.  Psychiatric:        Mood and Affect: Mood normal.        Behavior: Behavior normal.        Thought Content: Thought content normal.        Judgment: Judgment normal.    Results for orders placed or performed during the hospital encounter of 01/27/20 (from the past 24 hour(s))  CBC     Status: Abnormal   Collection Time: 01/27/20  3:29 PM  Result Value Ref Range   WBC 10.6 (H) 4.0 - 10.5 K/uL   RBC 3.16 (L) 3.87 - 5.11 MIL/uL   Hemoglobin 8.8 (L) 12.0 - 15.0 g/dL   HCT 27.0 (L) 36.0 - 46.0 %   MCV 85.4 80.0 - 100.0 fL   MCH 27.8 26.0 - 34.0 pg   MCHC 32.6 30.0 - 36.0 g/dL   RDW 14.0 11.5 - 15.5 %   Platelets 243 150 - 400 K/uL   nRBC 0.0 0.0 - 0.2 %  Comprehensive metabolic panel     Status: Abnormal   Collection Time: 01/27/20  3:29 PM  Result Value Ref Range   Sodium 136 135 - 145 mmol/L   Potassium 3.6 3.5 - 5.1 mmol/L   Chloride 107 98 - 111 mmol/L   CO2 19 (L) 22 - 32 mmol/L   Glucose, Bld 189 (H) 70 - 99 mg/dL   BUN 11 6 - 20 mg/dL   Creatinine, Ser 0.64 0.44 - 1.00 mg/dL   Calcium 8.6 (L) 8.9 - 10.3 mg/dL   Total Protein 5.5 (L) 6.5 - 8.1 g/dL   Albumin 2.4 (L) 3.5 - 5.0 g/dL   AST 24 15 - 41 U/L   ALT 40 0 - 44 U/L   Alkaline Phosphatase 104 38 - 126 U/L   Total Bilirubin 0.7 0.3 - 1.2 mg/dL   GFR, Estimated >60 >60 mL/min   Anion gap 10 5 - 15  Resp panel by RT-PCR (RSV, Flu A&B, Covid) Nasopharyngeal  Swab     Status: None   Collection Time: 01/27/20  3:49 PM   Specimen: Nasopharyngeal Swab; Nasopharyngeal(NP) swabs in vial transport medium  Result Value Ref Range   SARS  Coronavirus 2 by RT PCR NEGATIVE NEGATIVE   Influenza A by PCR NEGATIVE NEGATIVE   Influenza B by PCR NEGATIVE NEGATIVE   Resp Syncytial Virus by PCR NEGATIVE NEGATIVE  Protein / creatinine ratio, urine     Status: Abnormal   Collection Time: 01/27/20  4:08 PM  Result Value Ref Range   Creatinine, Urine 191.75 mg/dL   Total Protein, Urine 161 mg/dL   Protein Creatinine Ratio 0.84 (H) 0.00 - 0.15 mg/mg[Cre]  Urinalysis, Routine w reflex microscopic     Status: Abnormal   Collection Time: 01/27/20  4:08 PM  Result Value Ref Range   Color, Urine YELLOW YELLOW   APPearance HAZY (A) CLEAR   Specific Gravity, Urine 1.026 1.005 - 1.030   pH 6.0 5.0 - 8.0   Glucose, UA 50 (A) NEGATIVE mg/dL   Hgb urine dipstick SMALL (A) NEGATIVE   Bilirubin Urine NEGATIVE NEGATIVE   Ketones, ur NEGATIVE NEGATIVE mg/dL   Protein, ur 100 (A) NEGATIVE mg/dL   Nitrite NEGATIVE NEGATIVE   Leukocytes,Ua NEGATIVE NEGATIVE   RBC / HPF 0-5 0 - 5 RBC/hpf   WBC, UA 0-5 0 - 5 WBC/hpf   Bacteria, UA RARE (A) NONE SEEN   Squamous Epithelial / LPF 0-5 0 - 5   Mucus PRESENT    No results found.  MAU Course  Procedures  MDM -preeclampsia evaluation with severe range BP in MAU on admission and know hx of cHTN -symptoms include: none -UA: hazy/50GLU/sm hgb/100PRO/rare bacteria,sending urine for culture -COVID: negative -CBC: H/H 8.8/27.0, platelets 243 -CMP: serum creatinine 0.64, AST/ALT 24/40, glucose 189, discussed with Dr. Dione Plover, no treatment needed in MAU -PCr: 0.84 (normal 3 mo ago at 0.11) -EFM: reactive       -baseline: 145/140       -variability: moderate       -accels: present, 15x15       -decels: absent       -TOCO: quiet -pt discharged to home in stable condition  Orders Placed This Encounter  Procedures  . Resp panel by RT-PCR (RSV, Flu A&B, Covid) Nasopharyngeal Swab    Standing Status:   Standing    Number of Occurrences:   1    Order Specific Question:   Is this test for diagnosis or  screening    Answer:   Diagnosis of ill patient    Order Specific Question:   Symptomatic for COVID-19 as defined by CDC    Answer:   Yes    Order Specific Question:   Date of Symptom Onset    Answer:   01/27/2020    Order Specific Question:   Hospitalized for COVID-19    Answer:   Yes    Order Specific Question:   Admitted to ICU for COVID-19    Answer:   No    Order Specific Question:   Previously tested for COVID-19    Answer:   Yes    Order Specific Question:   Resident in a congregate (group) care setting    Answer:   Unknown    Order Specific Question:   Employed in healthcare setting    Answer:   Unknown    Order Specific Question:   Pregnant    Answer:   Yes    Order Specific Question:  Has patient completed COVID vaccination(s) (2 doses of Pfizer/Moderna 1 dose of [employer])    Answer:   Unknown  . Culture, OB Urine    Standing Status:   Standing    Number of Occurrences:   1  . Protein / creatinine ratio, urine    Standing Status:   Standing    Number of Occurrences:   1  . Urinalysis, Routine w reflex microscopic Urine, Clean Catch    Standing Status:   Standing    Number of Occurrences:   1  . CBC    Standing Status:   Standing    Number of Occurrences:   1  . Comprehensive metabolic panel    Standing Status:   Standing    Number of Occurrences:   1  . Airborne precautions    Standing Status:   Standing    Number of Occurrences:   1  . Discharge patient    Order Specific Question:   Discharge disposition    Answer:   01-Home or Self Care [1]    Order Specific Question:   Discharge patient date    Answer:   01/27/2020   No orders of the defined types were placed in this encounter.   Assessment and Plan   1. Pre-eclampsia in second trimester   2. Supervision of high risk pregnancy, antepartum   3. Preexisting diabetes complicating pregnancy, antepartum   4. Chronic hypertension affecting pregnancy   5. [redacted] weeks gestation of pregnancy   6. NST  (non-stress test) reactive     Allergies as of 01/27/2020   No Known Allergies     Medication List    TAKE these medications   aspirin EC 81 MG tablet Take 1 tablet (81 mg total) by mouth daily. Take after 12 weeks for prevention of preeclampsia later in pregnancy   Blood Pressure Kit Devi 1 Device by Does not apply route as needed.   insulin aspart 100 UNIT/ML injection Commonly known as: novoLOG Use as directed in insulin pump. MDD = 125 units. DX. E10.65   labetalol 200 MG tablet Commonly known as: NORMODYNE Take 1 tablet (200 mg total) by mouth 2 (two) times daily.   levocetirizine 5 MG tablet Commonly known as: XYZAL TAKE 1 TABLET BY MOUTH ONCE DAILY IN THE EVENING   MiniMed 630G Insulin Pump Kit by Does not apply route.   omeprazole 40 MG capsule Commonly known as: PRILOSEC Take 1 capsule (40 mg total) by mouth at bedtime. What changed: Another medication with the same name was added. Make sure you understand how and when to take each.   omeprazole 20 MG capsule Commonly known as: PRILOSEC TAKE 1 CAPSULE BY MOUTH TWICE DAILY BEFORE A MEAL What changed: You were already taking a medication with the same name, and this prescription was added. Make sure you understand how and when to take each.   pantoprazole 40 MG tablet Commonly known as: PROTONIX TAKE 1 TABLET BY MOUTH ONCE DAILY 20 TO 30 MINUTES BEFORE SUPPER   PRENATAL GUMMIES/DHA & FA PO Take 1 tablet by mouth daily.   promethazine 25 MG tablet Commonly known as: PHENERGAN Take 1 tablet (25 mg total) by mouth every 8 (eight) hours as needed for nausea or vomiting (headache).       -anemia IV infusion, message sent to [facility] Clinical Pool to schedule with high importance -discussed appropriate diabetic control with patient -new diagnosis PEC, message sent to provider scheduled with patient on 01/10  to alert to new diagnosis --Reviewed warning blood pressure values (systolic = / > 335 and/or diastolic =/>  90). Explained that, if blood pressure is elevated, she should sit down, rest, and eat/drink something. If still elevated 15 minutes later, and she is greater than 20 weeks, she should call clinic or come to MAU. She should come to MAU if she has elevated pressures and any of the following:  - headache not relieved with tylenol, rest, hydration -blurry vision, floating spots in her vision - sudden full-body edema or facial edema -RUQ pain that is constant. These symptoms may indicate that her blood pressure is worsening and she may be developing gestational hypertension or pre-eclampsia, which is an emergency.  -return MAU precautions given -pt discharged to home in stable condition  Gerrie Nordmann Dory Verdun 01/27/2020, 8:08 PM

## 2020-01-27 NOTE — Discharge Instructions (Signed)
Preeclampsia and Eclampsia Preeclampsia is a serious condition that may develop during pregnancy. This condition causes high blood pressure and increased protein in your urine along with other symptoms, such as headaches and vision changes. These symptoms may develop as the condition gets worse. Preeclampsia may occur at 20 weeks of pregnancy or later. Diagnosing and treating preeclampsia early is very important. If not treated early, it can cause serious problems for you and your baby. One problem it can lead to is eclampsia. Eclampsia is a condition that causes muscle jerking or shaking (convulsions or seizures) and other serious problems for the mother. During pregnancy, delivering your baby may be the best treatment for preeclampsia or eclampsia. For most women, preeclampsia and eclampsia symptoms go away after giving birth. In rare cases, a woman may develop preeclampsia after giving birth (postpartum preeclampsia). This usually occurs within 48 hours after childbirth but may occur up to 6 weeks after giving birth. What are the causes? The cause of preeclampsia is not known. What increases the risk? The following risk factors make you more likely to develop preeclampsia:  Being pregnant for the first time.  Having had preeclampsia during a past pregnancy.  Having a family history of preeclampsia.  Having high blood pressure.  Being pregnant with more than one baby.  Being 35 or older.  Being African-American.  Having kidney disease or diabetes.  Having medical conditions such as lupus or blood diseases.  Being very overweight (obese). What are the signs or symptoms? The most common symptoms are:  Severe headaches.  Vision problems, such as blurred or double vision.  Abdominal pain, especially upper abdominal pain. Other symptoms that may develop as the condition gets worse include:  Sudden weight gain.  Sudden swelling of the hands, face, legs, and feet.  Severe nausea  and vomiting.  Numbness in the face, arms, legs, and feet.  Dizziness.  Urinating less than usual.  Slurred speech.  Convulsions or seizures. How is this diagnosed? There are no screening tests for preeclampsia. Your health care provider will ask you about symptoms and check for signs of preeclampsia during your prenatal visits. You may also have tests that include:  Checking your blood pressure.  Urine tests to check for protein. Your health care provider will check for this at every prenatal visit.  Blood tests.  Monitoring your baby's heart rate.  Ultrasound. How is this treated? You and your health care provider will determine the treatment approach that is best for you. Treatment may include:  Having more frequent prenatal exams to check for signs of preeclampsia, if you have an increased risk for preeclampsia.  Medicine to lower your blood pressure.  Staying in the hospital, if your condition is severe. There, treatment will focus on controlling your blood pressure and the amount of fluids in your body (fluid retention).  Taking medicine (magnesium sulfate) to prevent seizures. This may be given as an injection or through an IV.  Taking a low-dose aspirin during your pregnancy.  Delivering your baby early. You may have your labor started with medicine (induced), or you may have a cesarean delivery. Follow these instructions at home: Eating and drinking   Drink enough fluid to keep your urine pale yellow.  Avoid caffeine. Lifestyle  Do not use any products that contain nicotine or tobacco, such as cigarettes and e-cigarettes. If you need help quitting, ask your health care provider.  Do not use alcohol or drugs.  Avoid stress as much as possible. Rest and get   plenty of sleep. General instructions  Take over-the-counter and prescription medicines only as told by your health care provider.  When lying down, lie on your left side. This keeps pressure off your  major blood vessels.  When sitting or lying down, raise (elevate) your feet. Try putting some pillows underneath your lower legs.  Exercise regularly. Ask your health care provider what kinds of exercise are best for you.  Keep all follow-up and prenatal visits as told by your health care provider. This is important. How is this prevented? There is no known way of preventing preeclampsia or eclampsia from developing. However, to lower your risk of complications and detect problems early:  Get regular prenatal care. Your health care provider may be able to diagnose and treat the condition early.  Maintain a healthy weight. Ask your health care provider for help managing weight gain during pregnancy.  Work with your health care provider to manage any long-term (chronic) health conditions you have, such as diabetes or kidney problems.  You may have tests of your blood pressure and kidney function after giving birth.  Your health care provider may have you take low-dose aspirin during your next pregnancy. Contact a health care provider if:  You have symptoms that your health care provider told you may require more treatment or monitoring, such as: ? Headaches. ? Nausea or vomiting. ? Abdominal pain. ? Dizziness. ? Light-headedness. Get help right away if:  You have severe: ? Abdominal pain. ? Headaches that do not get better. ? Dizziness. ? Vision problems. ? Confusion. ? Nausea or vomiting.  You have any of the following: ? A seizure. ? Sudden, rapid weight gain. ? Sudden swelling in your hands, ankles, or face. ? Trouble moving any part of your body. ? Numbness in any part of your body. ? Trouble speaking. ? Abnormal bleeding.  You faint. Summary  Preeclampsia is a serious condition that may develop during pregnancy.  This condition causes high blood pressure and increased protein in your urine along with other symptoms, such as headaches and vision  changes.  Diagnosing and treating preeclampsia early is very important. If not treated early, it can cause serious problems for you and your baby.  Get help right away if you have symptoms that your health care provider told you to watch for. This information is not intended to replace advice given to you by your health care provider. Make sure you discuss any questions you have with your health care provider. Document Revised: 09/09/2017 Document Reviewed: 08/14/2015 Elsevier Patient Education  2020 [employer]        Fetal Movement Counts Patient Name: ________________________________________________ Patient Due Date: ____________________ What is a fetal movement count?  A fetal movement count is the number of times that you feel your baby move during a certain amount of time. This may also be called a fetal kick count. A fetal movement count is recommended for every pregnant woman. You may be asked to start counting fetal movements as early as week 28 of your pregnancy. Pay attention to when your baby is most active. You may notice your baby's sleep and wake cycles. You may also notice things that make your baby move more. You should do a fetal movement count:  When your baby is normally most active.  At the same time each day. A good time to count movements is while you are resting, after having something to eat and drink. How do I count fetal movements? 1. Find a quiet,  comfortable area. Sit, or lie down on your side. 2. Write down the date, the start time and stop time, and the number of movements that you felt between those two times. Take this information with you to your health care visits. 3. Write down your start time when you feel the first movement. 4. Count kicks, flutters, swishes, rolls, and jabs. You should feel at least 10 movements. 5. You may stop counting after you have felt 10 movements, or if you have been counting for 2 hours. Write down the stop time. 6. If  you do not feel 10 movements in 2 hours, contact your health care provider for further instructions. Your health care provider may want to do additional tests to assess your baby's well-being. Contact a health care provider if:  You feel fewer than 10 movements in 2 hours.  Your baby is not moving like he or she usually does. Date: ____________ Start time: ____________ Stop time: ____________ Movements: ____________ Date: ____________ Start time: ____________ Stop time: ____________ Movements: ____________ Date: ____________ Start time: ____________ Stop time: ____________ Movements: ____________ Date: ____________ Start time: ____________ Stop time: ____________ Movements: ____________ Date: ____________ Start time: ____________ Stop time: ____________ Movements: ____________ Date: ____________ Start time: ____________ Stop time: ____________ Movements: ____________ Date: ____________ Start time: ____________ Stop time: ____________ Movements: ____________ Date: ____________ Start time: ____________ Stop time: ____________ Movements: ____________ Date: ____________ Start time: ____________ Stop time: ____________ Movements: ____________ This information is not intended to replace advice given to you by your health care provider. Make sure you discuss any questions you have with your health care provider. Document Revised: 08/27/2018 Document Reviewed: 08/27/2018 Elsevier Patient Education  2020 [employer].        Preterm Labor and Birth Information  The normal length of a pregnancy is 39-41 weeks. Preterm labor is when labor starts before 37 completed weeks of pregnancy. What are the risk factors for preterm labor? Preterm labor is more likely to occur in women who:  Have certain infections during pregnancy such as a bladder infection, sexually transmitted infection, or infection inside the uterus (chorioamnionitis).  Have a shorter-than-normal cervix.  Have gone into preterm  labor before.  Have had surgery on their cervix.  Are younger than age 29 or older than age 64.  Are African American.  Are pregnant with twins or multiple babies (multiple gestation).  Take street drugs or smoke while pregnant.  Do not gain enough weight while pregnant.  Became pregnant shortly after having been pregnant. What are the symptoms of preterm labor? Symptoms of preterm labor include:  Cramps similar to those that can happen during a menstrual period. The cramps may happen with diarrhea.  Pain in the abdomen or lower back.  Regular uterine contractions that may feel like tightening of the abdomen.  A feeling of increased pressure in the pelvis.  Increased watery or bloody mucus discharge from the vagina.  Water breaking (ruptured amniotic sac). Why is it important to recognize signs of preterm labor? It is important to recognize signs of preterm labor because babies who are born prematurely may not be fully developed. This can put them at an increased risk for:  Long-term (chronic) heart and lung problems.  Difficulty immediately after birth with regulating body systems, including blood sugar, body temperature, heart rate, and breathing rate.  Bleeding in the brain.  Cerebral palsy.  Learning difficulties.  Death. These risks are highest for babies who are born before 34 weeks of pregnancy. How is preterm  labor treated? Treatment depends on the length of your pregnancy, your condition, and the health of your baby. It may involve:  Having a stitch (suture) placed in your cervix to prevent your cervix from opening too early (cerclage).  Taking or being given medicines, such as: ? Hormone medicines. These may be given early in pregnancy to help support the pregnancy. ? Medicine to stop contractions. ? Medicines to help mature the baby's lungs. These may be prescribed if the risk of delivery is high. ? Medicines to prevent your baby from developing  cerebral palsy. If the labor happens before 34 weeks of pregnancy, you may need to stay in the hospital. What should I do if I think I am in preterm labor? If you think that you are going into preterm labor, call your health care provider right away. How can I prevent preterm labor in future pregnancies? To increase your chance of having a full-term pregnancy:  Do not use any tobacco products, such as cigarettes, chewing tobacco, and e-cigarettes. If you need help quitting, ask your health care provider.  Do not use street drugs or medicines that have not been prescribed to you during your pregnancy.  Talk with your health care provider before taking any herbal supplements, even if you have been taking them regularly.  Make sure you gain a healthy amount of weight during your pregnancy.  Watch for infection. If you think that you might have an infection, get it checked right away.  Make sure to tell your health care provider if you have gone into preterm labor before. This information is not intended to replace advice given to you by your health care provider. Make sure you discuss any questions you have with your health care provider. Document Revised: 05/01/2018 Document Reviewed: 05/31/2015 Elsevier Patient Education  [place].        Hypertension During Pregnancy High blood pressure (hypertension) is when the force of blood pumping through the arteries is too strong. Arteries are blood vessels that carry blood from the heart throughout the body. Hypertension during pregnancy can be mild or severe. Severe hypertension during pregnancy (preeclampsia) is a medical emergency that requires prompt evaluation and treatment. Different types of hypertension can happen during pregnancy. These include:  Chronic hypertension. This happens when you had high blood pressure before you became pregnant, and it continues during the pregnancy. Hypertension that develops before you are [redacted]  weeks pregnant and continues during the pregnancy is also called chronic hypertension. If you have chronic hypertension, it will not go away after you have your baby. You will need follow-up visits with your health care provider after you have your baby. Your doctor may want you to keep taking medicine for your blood pressure.  Gestational hypertension. This is hypertension that develops after the 20th week of pregnancy. Gestational hypertension usually goes away after you have your baby, but your health care provider will need to monitor your blood pressure to make sure that it is getting better.  Preeclampsia. This is severe hypertension during pregnancy. This can cause serious complications for you and your baby and can also cause complications for you after the delivery of your baby.  Postpartum preeclampsia. You may develop severe hypertension after giving birth. This usually occurs within 48 hours after childbirth but may occur up to 6 weeks after giving birth. This is rare. How does this affect me? Women who have hypertension during pregnancy have a greater chance of developing hypertension later in life or during  future pregnancies. In some cases, hypertension during pregnancy can cause serious complications, such as:  Stroke.  Heart attack.  Injury to other organs, such as kidneys, lungs, or liver.  Preeclampsia.  Convulsions or seizures.  Placental abruption. How does this affect my baby? Hypertension during pregnancy can affect your baby. Your baby may:  Be born early (prematurely).  Not weigh as much as he or she should at birth (low birth weight).  Not tolerate labor well, leading to an unplanned cesarean delivery. What are the risks? There are certain factors that make it more likely for you to develop hypertension during pregnancy. These include:  Having hypertension during a previous pregnancy.  Being overweight.  Being age 77 or older.  Being pregnant for the  first time.  Being pregnant with more than one baby.  Becoming pregnant using fertilization methods, such as IVF (in vitro fertilization).  Having other medical problems, such as diabetes, kidney disease, or lupus.  Having a family history of hypertension. What can I do to lower my risk? The exact cause of hypertension during pregnancy is not known. You may be able to lower your risk by:  Maintaining a healthy weight.  Eating a healthy and balanced diet.  Following your health care provider's instructions about treating any long-term conditions that you had before becoming pregnant. It is very important to keep all of your prenatal care appointments. Your health care provider will check your blood pressure and make sure that your pregnancy is progressing as expected. If a problem is found, early treatment can prevent complications. How is this treated? Treatment for hypertension during pregnancy varies depending on the type of hypertension you have and how serious it is.  If you were taking medicine for high blood pressure before you became pregnant, talk with your health care provider. You may need to change medicine during pregnancy because some medicines, like ACE inhibitors, may not be considered safe for your baby.  If you have gestational hypertension, your health care provider may order medicine to treat this during pregnancy.  If you are at risk for preeclampsia, your health care provider may recommend that you take a low-dose aspirin during your pregnancy.  If you have severe hypertension, you may need to be hospitalized so you and your baby can be monitored closely. You may also need to be given medicine to lower your blood pressure. This medicine may be given by mouth or through an IV.  In some cases, if your condition gets worse, you may need to deliver your baby early. Follow these instructions at home: Eating and drinking   Drink enough fluid to keep your urine pale  yellow.  Avoid caffeine. Lifestyle  Do not use any products that contain nicotine or tobacco, such as cigarettes, e-cigarettes, and chewing tobacco. If you need help quitting, ask your health care provider.  Do not use alcohol or drugs.  Avoid stress as much as possible.  Rest and get plenty of sleep.  Regular exercise can help to reduce your blood pressure. Ask your health care provider what kinds of exercise are best for you. General instructions  Take over-the-counter and prescription medicines only as told by your health care provider.  Keep all prenatal and follow-up visits as told by your health care provider. This is important. Contact a health care provider if:  You have symptoms that your health care provider told you may require more treatment or monitoring, such as: ? Headaches. ? Nausea or vomiting. ? Abdominal pain. ?  Dizziness. ? Light-headedness. Get help right away if:  You have: ? Severe abdominal pain that does not get better with treatment. ? A severe headache that does not get better. ? Vomiting that does not get better. ? Sudden, rapid weight gain. ? Sudden swelling in your hands, ankles, or face. ? Vaginal bleeding. ? Blood in your urine. ? Blurred or double vision. ? Shortness of breath or chest pain. ? Weakness on one side of your body. ? Difficulty speaking.  Your baby is not moving as much as usual. Summary  High blood pressure (hypertension) is when the force of blood pumping through the arteries is too strong.  Hypertension during pregnancy can cause problems for you and your baby.  Treatment for hypertension during pregnancy varies depending on the type of hypertension you have and how serious it is.  Keep all prenatal and follow-up visits as told by your health care provider. This is important. This information is not intended to replace advice given to you by your health care provider. Make sure you discuss any questions you have with  your health care provider. Document Revised: 04/30/2018 Document Reviewed: 02/03/2018 Elsevier Patient Education  2020 [employer].

## 2020-01-27 NOTE — Progress Notes (Signed)
[place] at [facility] [address], [place], [place] [address] [phone]  Date:  01/27/2020   Name:  Molly Savage   DOB:  [birth year]   MRN:  [id number]  PCP:  Shelda Pal, DO    Chief Complaint: No chief complaint on file.   History of Present Illness:  Molly Savage is a 30 y.o. very pleasant female patient who presents with the following:  Pt of Dr Nani Ravens- virtual visit today for concern of illness Patient location is home, my location is office.  Patient identity confirmed with 2 factors, she gives consent for virtual visit today covid series: she had J an J single dose in June  She was seen in the ER on 1/2 for ST- she was tested for covid and the flu; negative for her report  Her sx started about 2 weeks ago; she notes a stuffy nose, runny nose, cough, chest tightness, mild wheezing Cough may keep her up at night  No fever noted  She is due 04/22/20 per her report  She is pregnant, has DM1 and uses an insulin pump, also HTN-  She just checked her glucose- it was 58 prior to her meal, had her recheck during our visit, she notes her blood sugar is now 82  She is feeling good fetal movement No abd pain, no bleeding or fluid loss She does note that she may get some crampy pain "like a period"- she has noticed this for about a month, may occur for 5 minutes and then stop.   This is not getting worse   She checked her blood pressure shortly before our visit, reports that her BP was 158/93-  This is higher than is normal for her although she does note hypertension since her first pregnancy with her son-I believe he is now 48 years old Her pulse was 74   Patient Active Problem List   Diagnosis Date Noted  . H/O: cesarean section 10/27/2019  . Supervision of high risk pregnancy, antepartum 09/22/2019  . Preexisting diabetes complicating pregnancy, antepartum 09/22/2019  . Chronic hypertension affecting  pregnancy 09/22/2019  . Gastroesophageal reflux disease 11/25/2018  . Allergies 11/12/2017  . Increased ammonia level 09/13/2017  . 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 Poor memory 07/10/2017  . Skin lesion of left leg 06/18/2017  . Patellofemoral arthralgia of both knees 05/21/2017  . Left foot pain 05/21/2017  . Callus of foot 04/24/2017  . Morbid obesity ([place]) 01/30/2017  . Lactose intolerance 12/26/2015  . BMI 40.0-44.9, adult ([place]) 09/18/2015  . Situational mixed anxiety and depressive disorder 07/30/2015  . Depression 07/30/2015  . Diabetic foot infection ([place]) 04/17/2015  . 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 Tobacco abuse   . Chronic hypertension 08/24/2014  . Type 1 diabetes mellitus with hyperglycemia ([place]) 05/25/2014  . Type 1 diabetes mellitus with diabetic polyneuropathy ([place]) 05/25/2014    Past Medical History:  Diagnosis Date  . Anxiety   . Depression   . Diabetes mellitus type 1 ([place])    Follows with Endo  . GERD (gastroesophageal reflux disease)   . Hypertension   . Tobacco abuse   . Wears glasses     Past Surgical History:  Procedure Laterality Date  . CESAREAN SECTION N/A 02/01/2016   Procedure: CESAREAN SECTION;  Surgeon: Mora Bellman, MD;  Location: [facility];  Service: Obstetrics;  Laterality: N/A;  . CESAREAN SECTION    . CHOLECYSTECTOMY  N/A 06/16/2019   Procedure: LAPAROSCOPIC CHOLECYSTECTOMY;  Surgeon: Kieth Brightly Arta Bruce, MD;  Location: [facility];  Service: General;  Laterality: N/A;  . FOOT SURGERY Left   . TOOTH EXTRACTION  2018    Social History   Tobacco Use  . Smoking status: Current Every Day Smoker    Packs/day: 1.00    Years: 12.00    Pack years: 12.00    Types: Cigarettes  . Smokeless tobacco: Never Used  . Tobacco comment: trying to cut back   Vaping Use  . Vaping Use: Never used  Substance Use Topics  . Alcohol use: Not Currently    Comment: occasionally  . Drug use: No    Family History   Problem Relation Age of Onset  . Hypertension Mother   . Depression Mother   . Bipolar disorder Sister   . Drug abuse Sister   . Lupus Brother   . Heart disease Maternal Uncle   . ADD / ADHD Brother   . Sleep apnea Brother   . Colon cancer Neg Hx   . Esophageal cancer Neg Hx   . Rectal cancer Neg Hx   . Stomach cancer Neg Hx     No Known Allergies  Medication list has been reviewed and updated.  Current Outpatient Medications on File Prior to Visit  Medication Sig Dispense Refill  . aspirin EC 81 MG tablet Take 1 tablet (81 mg total) by mouth daily. Take after 12 weeks for prevention of preeclampsia later in pregnancy 300 tablet 2  . Blood Pressure Monitoring (BLOOD PRESSURE KIT) DEVI 1 Device by Does not apply route as needed. 1 each 0  . insulin aspart (NOVOLOG) 100 UNIT/ML injection Use as directed in insulin pump. MDD = 125 units. DX. E10.65    . Insulin Infusion Pump (MINIMED 630G INSULIN PUMP) KIT by Does not apply route.    . labetalol (NORMODYNE) 200 MG tablet Take 1 tablet (200 mg total) by mouth 2 (two) times daily. 60 tablet 3  . levocetirizine (XYZAL) 5 MG tablet TAKE 1 TABLET BY MOUTH ONCE DAILY IN THE EVENING 30 tablet 0  . lidocaine (LIDODERM) 5 % Place 1 patch onto the skin daily as needed (Pain). Remove & Discard patch within 12 hours or as directed by MD (Patient not taking: No sig reported) 30 patch 0  . omeprazole (PRILOSEC) 40 MG capsule Take 1 capsule (40 mg total) by mouth at bedtime.    . pantoprazole (PROTONIX) 40 MG tablet TAKE 1 TABLET BY MOUTH ONCE DAILY 20 TO 30 MINUTES BEFORE SUPPER 30 tablet 3  . Prenatal MV-Min-FA-Omega-3 (PRENATAL GUMMIES/DHA & FA PO) Take 1 tablet by mouth daily.    . promethazine (PHENERGAN) 25 MG tablet Take 1 tablet (25 mg total) by mouth every 8 (eight) hours as needed for nausea or vomiting (headache). 30 tablet 0   No current facility-administered medications on file prior to visit.    Review of Systems:  As per HPI-  otherwise negative.   Physical Examination: There were no vitals filed for this visit. There were no vitals filed for this visit. There is no height or weight on file to calculate BMI. Ideal Body Weight:    Patient is observed over video, she looks well.  No distress is noted  Assessment and Plan: Pregnancy, unspecified gestational age  Elevated blood pressure affecting pregnancy in second trimester, antepartum  Type 1 diabetes mellitus with retinopathy, macular edema presence unspecified, unspecified laterality, unspecified retinopathy severity ([place])  Virtual  visit today for concern of illness, patient was seen at the ER on January 2.  Negative for COVID-19 and flu at that time, symptoms thought due to likely viral URI versus allergies  Main concern today is that her glucose is low, blood pressure is too high.  I am concerned she may be in danger of developing preeclampsia.  This is beyond my scope of care  I called her OBG, Dr Sabra Heck- there is no answer and no phone tree, only option is to LM.  I have left message at 10:54 am and await return call  No call back by approximately noon.  I called maternity admissions at [facility], was advised to have the patient come in for evaluation. I called the patient back and gave her this recommendation, she states agreement and plans to go to [facility]  Video used for entirety of visit today Signed Lamar Blinks, MD

## 2020-01-28 ENCOUNTER — Encounter: Payer: Medicaid Other | Admitting: Obstetrics & Gynecology

## 2020-01-29 LAB — CULTURE, OB URINE

## 2020-01-31 ENCOUNTER — Ambulatory Visit (INDEPENDENT_AMBULATORY_CARE_PROVIDER_SITE_OTHER): Payer: Medicaid Other | Admitting: Obstetrics & Gynecology

## 2020-01-31 ENCOUNTER — Ambulatory Visit: Payer: Medicaid Other | Attending: Obstetrics and Gynecology

## 2020-01-31 ENCOUNTER — Other Ambulatory Visit: Payer: Self-pay

## 2020-01-31 ENCOUNTER — Ambulatory Visit: Payer: Medicaid Other | Admitting: *Deleted

## 2020-01-31 VITALS — BP 138/84 | HR 87 | Wt 292.8 lb

## 2020-01-31 DIAGNOSIS — F419 Anxiety disorder, unspecified: Secondary | ICD-10-CM

## 2020-01-31 DIAGNOSIS — F32A Depression, unspecified: Secondary | ICD-10-CM

## 2020-01-31 DIAGNOSIS — O10919 Unspecified pre-existing hypertension complicating pregnancy, unspecified trimester: Secondary | ICD-10-CM

## 2020-01-31 DIAGNOSIS — E1065 Type 1 diabetes mellitus with hyperglycemia: Secondary | ICD-10-CM

## 2020-01-31 DIAGNOSIS — O99333 Smoking (tobacco) complicating pregnancy, third trimester: Secondary | ICD-10-CM | POA: Diagnosis not present

## 2020-01-31 DIAGNOSIS — Z23 Encounter for immunization: Secondary | ICD-10-CM

## 2020-01-31 DIAGNOSIS — I10 Essential (primary) hypertension: Secondary | ICD-10-CM | POA: Insufficient documentation

## 2020-01-31 DIAGNOSIS — O24313 Unspecified pre-existing diabetes mellitus in pregnancy, third trimester: Secondary | ICD-10-CM | POA: Diagnosis not present

## 2020-01-31 DIAGNOSIS — O10913 Unspecified pre-existing hypertension complicating pregnancy, third trimester: Secondary | ICD-10-CM

## 2020-01-31 DIAGNOSIS — Z363 Encounter for antenatal screening for malformations: Secondary | ICD-10-CM

## 2020-01-31 DIAGNOSIS — O24319 Unspecified pre-existing diabetes mellitus in pregnancy, unspecified trimester: Secondary | ICD-10-CM

## 2020-01-31 DIAGNOSIS — O99343 Other mental disorders complicating pregnancy, third trimester: Secondary | ICD-10-CM

## 2020-01-31 DIAGNOSIS — Z3A28 28 weeks gestation of pregnancy: Secondary | ICD-10-CM

## 2020-01-31 DIAGNOSIS — O24912 Unspecified diabetes mellitus in pregnancy, second trimester: Secondary | ICD-10-CM | POA: Insufficient documentation

## 2020-01-31 DIAGNOSIS — O0990 Supervision of high risk pregnancy, unspecified, unspecified trimester: Secondary | ICD-10-CM

## 2020-01-31 DIAGNOSIS — Z720 Tobacco use: Secondary | ICD-10-CM

## 2020-01-31 LAB — POCT URINALYSIS DIP (DEVICE)
Bilirubin Urine: NEGATIVE
Glucose, UA: 500 mg/dL — AB
Ketones, ur: NEGATIVE mg/dL
Leukocytes,Ua: NEGATIVE
Nitrite: NEGATIVE
Protein, ur: 30 mg/dL — AB
Specific Gravity, Urine: 1.015 (ref 1.005–1.030)
Urobilinogen, UA: 0.2 mg/dL (ref 0.0–1.0)
pH: 6.5 (ref 5.0–8.0)

## 2020-01-31 MED ORDER — FERROUS SULFATE 325 (65 FE) MG PO TABS: 325.0000 mg | ORAL_TABLET | ORAL | 3 refills | Status: DC

## 2020-01-31 MED ORDER — AZITHROMYCIN 250 MG PO TABS: ORAL_TABLET | ORAL | 0 refills | Status: DC

## 2020-01-31 MED ORDER — LABETALOL HCL 200 MG PO TABS: 300.0000 mg | ORAL_TABLET | Freq: Two times a day (BID) | ORAL | 1 refills | Status: DC

## 2020-01-31 NOTE — Progress Notes (Signed)
PRENATAL VISIT NOTE  Subjective:  Molly Savage is a 30 y.o. G2P1001 at [redacted]w[redacted]d being seen today for ongoing prenatal care.  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); Tobacco abuse; Diabetic foot infection (HCC); BMI 40.0-44.9, adult (HCC); Situational mixed anxiety and depressive disorder; Depression; Chronic hypertension; Lactose intolerance; Morbid obesity (HCC); Callus of foot; Patellofemoral arthralgia of both knees; Left foot pain; Skin lesion of left leg; Granuloma annulare; Poor memory; Allergies; Increased ammonia level; Type 1 diabetes mellitus with hyperglycemia (HCC); Gastroesophageal reflux disease; Type 1 diabetes mellitus with diabetic polyneuropathy (HCC); Supervision of high risk pregnancy, antepartum; Preexisting diabetes complicating pregnancy, antepartum; Chronic hypertension affecting pregnancy; H/O: cesarean section; and Preeclampsia on their problem list.  Patient reports cough and congestion, negative testing .  Contractions: Not present. Vag. Bleeding: None.  Movement: Present. Denies leaking of fluid.   The following portions of the patient's history were reviewed and updated as appropriate: allergies, current medications, past family history, past medical history, past social history, past surgical history and problem list.   Objective:   Vitals:   01/31/20 1339  BP: 138/84  Pulse: 87  Weight: 292 lb 12.8 oz (132.8 kg)    Fetal Status: Fetal Heart Rate (bpm): 148   Movement: Present     General:  Alert, oriented and cooperative. Patient is in no acute distress.  Skin: Skin is warm and dry. No rash noted.   Cardiovascular: Normal heart rate noted  Respiratory: Normal respiratory effort, no problems with respiration noted  Abdomen: Soft, gravid, appropriate for gestational age.  Pain/Pressure: Present     Pelvic: Cervical exam deferred        Extremities: Normal  range of motion.  Edema: None  Mental Status: Normal mood and affect. Normal behavior. Normal judgment and thought content.   Assessment and Plan:  Pregnancy: G2P1001 at [redacted]w[redacted]d 1. Preexisting diabetes complicating pregnancy, antepartum  FBS > 100 anPP up to 150, needs pump adjusted, f/u w/education  - ferrous sulfate 325 (65 FE) MG tablet; Take 1 tablet (325 mg total) by mouth every other day.  Dispense: 30 tablet; Refill: 3  2. Supervision of high risk pregnancy, antepartum 28 week labs - HIV Antibody (routine testing w rflx) - RPR - ferrous sulfate 325 (65 FE) MG tablet; Take 1 tablet (325 mg total) by mouth every other day.  Dispense: 30 tablet; Refill: 3 - azithromycin (ZITHROMAX Z-PAK) 250 MG tablet; 2 PO first dose then one a day total of 5 days  Dispense: 6 each; Refill: 0  3. Chronic hypertension affecting pregnancy Increased proteinuria potential SIPIH - ferrous sulfate 325 (65 FE) MG tablet; Take 1 tablet (325 mg total) by mouth every other day.  Dispense: 30 tablet; Refill: 3 - labetalol (NORMODYNE) 200 MG tablet; Take 1.5 tablets (300 mg total) by mouth 2 (two) times daily.  Dispense: 90 tablet; Refill: 1  4. Type 1 diabetes mellitus with hyperglycemia (HCC) As above  Preterm labor symptoms and general obstetric precautions including but not limited to vaginal bleeding, contractions, leaking of fluid and fetal movement were reviewed in detail with the patient. Please refer to After Visit Summary for other counseling recommendations.   Return in about 2 weeks (around 02/14/2020) for HROB.  Future Appointments  Date Time Provider Department Center  01/31/2020  2:30 PM [facility] NURSE [facility]  01/31/2020  2:45 PM [facility] US5 [facility]  02/09/2020 10:00 AM [id number] None  Scheryl Darter, MD

## 2020-01-31 NOTE — Progress Notes (Signed)
Patient has a cough. Has been seen at [facility]. States she was negative for Covid and Flu X 2. No results in epic. One result in care Everywhere on 01/23/20 was negative for Covid and Flu. MD prescribed Z-pack today which patient has not started yet. Patient denies fever, loss of taste or smell. States her boyfriend was sick before her as well as her son and they both tested negative for Covid.

## 2020-01-31 NOTE — Patient Instructions (Signed)

## 2020-02-01 ENCOUNTER — Other Ambulatory Visit: Payer: Self-pay | Admitting: *Deleted

## 2020-02-01 DIAGNOSIS — Z362 Encounter for other antenatal screening follow-up: Secondary | ICD-10-CM

## 2020-02-01 LAB — RPR: RPR Ser Ql: NONREACTIVE

## 2020-02-01 LAB — HIV ANTIBODY (ROUTINE TESTING W REFLEX): HIV Screen 4th Generation wRfx: NONREACTIVE

## 2020-02-07 ENCOUNTER — Encounter: Payer: Self-pay | Admitting: *Deleted

## 2020-02-07 NOTE — Progress Notes (Signed)
Tried to reach patient via phone.  Left voicemail message and sent My Chart message.  Explained office opening at 10 am in the morning 02/08/20 due to inclement weather.  Explained we will cancel her diabetes education appointment at 8:15 am and reschedule.  Asked patient to come in at 10 am for her 9:55 am appointment with the doctor.  Explained we would reschedule her diabetes education appointment when she comes in to see the doctor.

## 2020-02-08 ENCOUNTER — Encounter: Payer: Self-pay | Admitting: Obstetrics & Gynecology

## 2020-02-08 ENCOUNTER — Telehealth (INDEPENDENT_AMBULATORY_CARE_PROVIDER_SITE_OTHER): Payer: Medicaid Other | Admitting: Obstetrics & Gynecology

## 2020-02-08 ENCOUNTER — Other Ambulatory Visit: Payer: Self-pay

## 2020-02-08 ENCOUNTER — Other Ambulatory Visit: Payer: Medicaid Other

## 2020-02-08 DIAGNOSIS — O24319 Unspecified pre-existing diabetes mellitus in pregnancy, unspecified trimester: Secondary | ICD-10-CM

## 2020-02-08 DIAGNOSIS — Z3A29 29 weeks gestation of pregnancy: Secondary | ICD-10-CM

## 2020-02-08 NOTE — Progress Notes (Signed)
Pt called to change appt to virtual visit because her feet are so swollen that she cannot come in.  She was called but did not answer and highly encouraged to have in person appointment.  H/O insulin requiring diabetes, non compliant.

## 2020-02-08 NOTE — Progress Notes (Signed)
Was informed by the front office that the patient stated that she her legs are swollen and she can not come to her appt if she could be virtual.  Left message for pt to please call the office as we would like to evaluate her legs.    Addison Naegeli, RN  02/08/20

## 2020-02-09 ENCOUNTER — Inpatient Hospital Stay (HOSPITAL_COMMUNITY): Admission: RE | Admit: 2020-02-09 | Payer: Medicaid Other | Source: Ambulatory Visit

## 2020-02-09 ENCOUNTER — Encounter: Payer: Self-pay | Admitting: Obstetrics & Gynecology

## 2020-02-09 ENCOUNTER — Telehealth: Payer: Medicaid Other | Admitting: Obstetrics & Gynecology

## 2020-02-09 ENCOUNTER — Ambulatory Visit (INDEPENDENT_AMBULATORY_CARE_PROVIDER_SITE_OTHER): Payer: Medicaid Other | Admitting: Obstetrics & Gynecology

## 2020-02-09 ENCOUNTER — Other Ambulatory Visit: Payer: Self-pay

## 2020-02-09 VITALS — BP 131/76 | HR 88 | Wt 301.9 lb

## 2020-02-09 DIAGNOSIS — Z3A29 29 weeks gestation of pregnancy: Secondary | ICD-10-CM

## 2020-02-09 DIAGNOSIS — O119 Pre-existing hypertension with pre-eclampsia, unspecified trimester: Secondary | ICD-10-CM

## 2020-02-09 DIAGNOSIS — O0990 Supervision of high risk pregnancy, unspecified, unspecified trimester: Secondary | ICD-10-CM

## 2020-02-09 DIAGNOSIS — O1203 Gestational edema, third trimester: Secondary | ICD-10-CM

## 2020-02-09 DIAGNOSIS — O34219 Maternal care for unspecified type scar from previous cesarean delivery: Secondary | ICD-10-CM

## 2020-02-09 DIAGNOSIS — O99013 Anemia complicating pregnancy, third trimester: Secondary | ICD-10-CM | POA: Insufficient documentation

## 2020-02-09 DIAGNOSIS — O24319 Unspecified pre-existing diabetes mellitus in pregnancy, unspecified trimester: Secondary | ICD-10-CM

## 2020-02-09 NOTE — Progress Notes (Signed)
PRENATAL VISIT NOTE  Subjective:  Molly Savage is a 30 y.o. G2P1001 at [redacted]w[redacted]d being seen today for ongoing prenatal care.  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); Tobacco abuse; Diabetic foot infection (HCC); BMI 40.0-44.9, adult (HCC); Situational mixed anxiety and depressive disorder; Depression; Chronic hypertension; Lactose intolerance; Morbid obesity (HCC); Callus of foot; Patellofemoral arthralgia of both knees; Left foot pain; Skin lesion of left leg; Granuloma annulare; Poor memory; Allergies; Increased ammonia level; Type 1 diabetes mellitus with hyperglycemia (HCC); Gastroesophageal reflux disease; Type 1 diabetes mellitus with diabetic polyneuropathy (HCC); Supervision of high risk pregnancy, antepartum; Preexisting diabetes complicating pregnancy, antepartum; Chronic hypertension with superimposed preeclampsia; Previous cesarean delivery x 1, antepartum; Preeclampsia; and Anemia affecting pregnancy in third trimester on their problem list.  Patient reports worsening edema in bilateral lower legs and under belly. Also some back pain with enlarging pregnancy. Also has residual cough, had negative COVIS test on 01/27/20, has been on prolonged antibiotics (Azithromycin).  Contractions: Not present. Vag. Bleeding: None.  Movement: Present. Denies leaking of fluid.   The following portions of the patient's history were reviewed and updated as appropriate: allergies, current medications, past family history, past medical history, past social history, past surgical history and problem list.   Objective:   Vitals:   02/09/20 1131  BP: 131/76  Pulse: 88  Weight: (!) 301 lb 14.4 oz (136.9 kg)    Fetal Status: Fetal Heart Rate (bpm): 150 on u/s   Movement: Present     General:  Alert, oriented and cooperative. Patient is in no acute distress.  Skin: Skin is warm and dry. No rash noted.    Cardiovascular: Normal heart rate noted  Respiratory: Normal respiratory effort, no problems with respiration noted  Abdomen: Soft, gravid, appropriate for gestational age.  Pain/Pressure: Present     Pelvic: Cervical exam deferred        Extremities: Normal range of motion.  Edema: Moderate pitting, indentation subsides rapidly  Mental Status: Normal mood and affect. Normal behavior. Normal judgment and thought content.    Imaging: [place] MFM OB FOLLOW UP  Result Date: 01/31/2020 ----------------------------------------------------------------------  OBSTETRICS REPORT                       (Signed Final 01/31/2020 04:37 pm) ---------------------------------------------------------------------- Patient Info  ID #:       [id number]                          D.O.B.:  03/25/1990 (29 yrs)  Name:       Molly Savage               Visit Date: 01/31/2020 04:00 pm ---------------------------------------------------------------------- Performed By  Attending:        Ma Rings MD         Ref. Address:     [address]                                                             [place], [place]  [id number]  Performed By:     Sandi MealyJovancia Adrien        [facility]  [facility]erred By:      [facility] ---------------------------------------------------------------------- Orders  #  Description                           Code        Ordered By  1  US MFM OB FOLLOW UP                   [id number]    Noralee SpaceAVI SHANKAR ----------------------------------------------------------------------  #  Order #                     Accession #                Episode #  1  [id number]                   [id number]                  [id number] ---------------------------------------------------------------------- Indications  Diabetes - Pregestational, 2nd trimester       O24.312  Hypertension - Chronic/Pre-existing            O10.019  Tobacco use complicating pregnancy,            O99.332  second trimester  Medical complication of pregnancy (Anxiety/    O26.90  Depression)  History of cesarean delivery, currently        O34.219  pregnant  Obesity complicating pregnancy, second         O99.212  trimester (BMI 40)  Encounter for antenatal screening for          Z36.3  malformations  [redacted] weeks gestation of pregnancy                Z3A.28 ---------------------------------------------------------------------- Fetal Evaluation  Num Of Fetuses:         1  Fetal Heart Rate(bpm):  135  Cardiac Activity:       Observed  Presentation:           Cephalic  Placenta:  Left lateral  P. Cord Insertion:      Visualized  Amniotic Fluid  AFI FV:      Within normal limits  AFI Sum(cm)     %Tile       Largest Pocket(cm)  14.93           52          4.86  RUQ(cm)       RLQ(cm)       LUQ(cm)        LLQ(cm)  2.39          3.96          4.86           3.72 ---------------------------------------------------------------------- Biometry  BPD:        71  mm     G. Age:  28w 4d         45  %    CI:        70.01   %    70 - 86                                                          FL/HC:      19.6   %    18.8 - 20.6  HC:      270.7  mm     G. Age:  29w 4d         58  %    HC/AC:      1.03        1.05 - 1.21  AC:      261.9  mm     G. Age:  30w 2d         93  %    FL/BPD:     74.8   %    71 - 87  FL:       53.1  mm     G. Age:  28w 1d         32  %    FL/AC:      20.3   %    20 - 24  HUM:      43.8  mm     G. Age:  26w 0d        < 5  %  Est. FW:    1393  gm      3 lb 1 oz     80  % ---------------------------------------------------------------------- OB History  Blood Type:   O+  Gravidity:    2         Term:   1        Prem:   0        SAB:   0  TOP:           0       Ectopic:  0        Living: 1 ---------------------------------------------------------------------- Gestational Age  LMP:           28w 2d        Date:  07/17/19                 EDD:   04/22/20  U/S Today:     29w 1d  EDD:   04/16/20  Best:          Eden Emms 2d     Det. By:  LMP  (07/17/19)          EDD:   04/22/20 ---------------------------------------------------------------------- Anatomy  Cranium:               Limited Visualization  Aortic Arch:            Not well visualized  Cavum:                 Appears normal         Ductal Arch:            Not well visualized  Ventricles:            Not well visualized    Diaphragm:              Appears normal  Choroid Plexus:        Not well visualized    Stomach:                Appears normal, left                                                                        sided  Cerebellum:            Limited Visualization  Abdomen:                Appears normal  Posterior Fossa:       Limited Visualization  Abdominal Wall:         Appears nml (cord                                                                        insert, abd wall)  Nuchal Fold:           Appears normal         Cord Vessels:           Appears normal (3                                                                        vessel cord)  Face:                  Not well visualized    Kidneys:                Appear normal  Lips:                  Not well visualized    Bladder:                Appears normal  Thoracic:  Appears normal         Spine:                  Not well visualized  Heart:                 Not well visualized    Upper Extremities:      Visualized Limited  RVOT:                  Not well visualized    Lower Extremities:      Visualized Limited  LVOT:                  Not well visualized  Other:  Fetus appears to be female. ---------------------------------------------------------------------- Comments  This patient was seen for a follow  up growth scan due to  maternal obesity, pregestational diabetes currently treated  with an insulin pump, and chronic hypertension currently  treated with labetalol.  The patient has missed multiple prior  ultrasound appointments including her appointments for a  fetal echocardiogram.  She denies any problems since her  last exam.  She was informed that the fetal growth and amniotic fluid  level appears appropriate for her gestational age.  The views of the fetal anatomy including views of the heart  remain suboptimal due to extreme maternal body habitus.  The patient was given another requisition for a fetal  echocardiogram with [facility].  She was  encouraged to keep that appointment when they call her.  Due to her underlying medical conditions, weekly fetal testing  will be started at around 32 weeks.  A follow-up growth scan and biophysical profile was  scheduled in 4 weeks. ----------------------------------------------------------------------                   Ma Rings, MD Electronically Signed Final Report   01/31/2020 04:37 pm ----------------------------------------------------------------------   Assessment and Plan:  Pregnancy: G2P1001 at [redacted]w[redacted]d 1. Preexisting diabetes complicating pregnancy, antepartum Reviewed BS on meter. All abnormal fasting 140-170, PP 120-160, one elevated at 265.  On insulin pump, followed by Endocrine. Pump basal and meal levels need to be adjusted ASAP. Told to contact Endocrinologist's office about this, also referred to Diabetic Coordinator ASAP to help with pump settings. No concerning symptoms today, but will check labs today. will follow up results and manage accordingly.  Antenatal testing and scans as per MFM.  - Amb Referral to Nutrition and Diabetic E - Hemoglobin A1c - Beta-hydroxybutyric acid - Protein / creatinine ratio, urine  2. Chronic hypertension with superimposed preeclampsia Stable BP today on Labetalol 300 mg bid, continue this  for now. Labs to be checked today.  - CBC - Comprehensive metabolic panel - Protein / creatinine ratio, urine  3. Edema during pregnancy in third trimester Recommended leg elevation, stockings as needed.  Diuretics are typically not prescribed unless in extenuating circumstances and for short durations. Will continue to monitor.  4. Anemia affecting pregnancy in third trimester CBC Latest Ref Rng & Units 01/27/2020 10/06/2019 06/09/2019  WBC 4.0 - 10.5 K/uL 10.6(H) 11.7(H) 8.3  Hemoglobin 12.0 - 15.0 g/dL 5.6(O) 13.0 11.7(L)  Hematocrit 36.0 - 46.0 % 27.0(L) 36.7 37.1  Platelets 150 - 400 K/uL 243 241 255  Getting iron infusions as recommended.   5. Previous cesarean delivery x 1, antepartum Desires repeat cesarean section but I discussed concerns about her uncontrolled DM and BMI,  increased risk of infection, delayed wound healing. Discussed that risk of uterine  rupture was <1%, that risk is halved with scheduled RCS but never zero (she was worried about this). She will think about this some more.  6. [redacted] weeks gestation of pregnancy 7. Supervision of high risk pregnancy, antepartum Reassured about symptoms expected in third trimester with enlarging pregnancy.  Preterm labor symptoms and general obstetric precautions including but not limited to vaginal bleeding, contractions, leaking of fluid and fetal movement were reviewed in detail with the patient. Please refer to After Visit Summary for other counseling recommendations.   Return in about 2 weeks (around 02/23/2020) for OFFICE OB VISIT (MD only) but needs appointment with DM Coordinator ASAP.  Future Appointments  Date Time Provider Department Center  02/28/2020  2:45 PM [facility]  03/07/2020  2:45 PM [facility]    Jaynie CollinsUgonna Onofrio Klemp, MD

## 2020-02-09 NOTE — Patient Instructions (Signed)
Return to office for any scheduled appointments. Call the office or go to the MAU at [facility] if:  You begin to have strong, frequent contractions  Your water breaks.  Sometimes it is a big gush of fluid, sometimes it is just a trickle that keeps getting your panties wet or running down your legs  You have vaginal bleeding.  It is normal to have a small amount of spotting if your cervix was checked.   You do not feel your baby moving like normal.  If you do not, get something to eat and drink and lay down and focus on feeling your baby move.   If your baby is still not moving like normal, you should call the office or go to MAU.  Any other obstetric concerns.   Vaginal Birth After Cesarean Delivery  Vaginal birth after cesarean delivery (VBAC) is giving birth vaginally after previously delivering a baby through a cesarean section (C-section). A VBAC may be a safe option for you, depending on your health and other factors. It is important to discuss VBAC with your health care provider early in your pregnancy so you can understand the risks, benefits, and options. Having these discussions early will give you time to make your birth plan. Who are the best candidates for VBAC? The best candidates for VBAC are women who:  Have had one or two prior cesarean deliveries, and the incision made during the delivery was horizontal (low transverse).  Do not have a vertical (classical) scar on their uterus.  Have not had a tear in the wall of their uterus (uterine rupture).  Plan to have more pregnancies. A VBAC is also more likely to be successful:  In women who have previously given birth vaginally.  When labor starts by itself (spontaneously) before the due date. What are the benefits of VBAC? The benefits of delivering your baby vaginally instead of by a cesarean delivery include:  A shorter hospital stay.  A faster recovery time.  Less pain.  Avoiding  risks associated with major surgery, such as infection and blood clots.  Less blood loss and less need for donated blood (transfusions). What are the risks of VBAC? The main risk of attempting a VBAC is that it may fail, forcing your health care provider to deliver your baby by a C-section. Other risks are rare and include:  Tearing (rupture) of the scar from a past cesarean delivery.  Other risks associated with vaginal deliveries. If a repeat cesarean delivery is needed, the risks include:  Blood loss.  Infection.  Blood clot.  Damage to surrounding organs.  Removal of the uterus (hysterectomy), if it is damaged.  Placenta problems in future pregnancies. What else should I know about my options? Delivering a baby through a VBAC is similar to having a normal spontaneous vaginal delivery. Therefore, it is safe:  To try with twins.  For your health care provider to try to turn the baby from a breech position (external cephalic version) during labor.  With epidural analgesia for pain relief. Consider where you would like to deliver your baby. VBAC should be attempted in facilities where an emergency cesarean delivery can be performed. VBAC is not recommended for home births. Any changes in your health or your baby's health during your pregnancy may make it necessary to change your initial decision about VBAC. Your health care provider may recommend that you do not attempt a VBAC if:  Your baby's suspected weight is  8.8 lb (4 kg) or more.  You have preeclampsia. This is a condition that causes high blood pressure along with other symptoms, such as swelling and headaches.  You will have VBAC less than 19 months after your cesarean delivery.  You are past your due date.  You need to have labor started (induced) because your cervix is not ready for labor (unfavorable). Where to find more information  American Pregnancy Association: americanpregnancy.org  [employer] of  Obstetricians and Gynecologists: acog.org Summary  Vaginal birth after cesarean delivery (VBAC) is giving birth vaginally after previously delivering a baby through a cesarean section (C-section). A VBAC may be a safe option for you, depending on your health and other factors.  Discuss VBAC with your health care provider early in your pregnancy so you can understand the risks, benefits, options, and have plenty of time to make your birth plan.  The main risk of attempting a VBAC is that it may fail, forcing your health care provider to deliver your baby by a C-section. Other risks are rare. This information is not intended to replace advice given to you by your health care provider. Make sure you discuss any questions you have with your health care provider. Document Revised: 05/05/2018 Document Reviewed: 04/16/2016 Elsevier Patient Education  2021 [employer].

## 2020-02-10 LAB — CBC
Hematocrit: 31.4 % — ABNORMAL LOW (ref 34.0–46.6)
Hemoglobin: 10.3 g/dL — ABNORMAL LOW (ref 11.1–15.9)
MCH: 27.3 pg (ref 26.6–33.0)
MCHC: 32.8 g/dL (ref 31.5–35.7)
MCV: 83 fL (ref 79–97)
Platelets: 233 10*3/uL (ref 150–450)
RBC: 3.77 x10E6/uL (ref 3.77–5.28)
RDW: 14.3 % (ref 11.7–15.4)
WBC: 6.6 10*3/uL (ref 3.4–10.8)

## 2020-02-10 LAB — PROTEIN / CREATININE RATIO, URINE
Creatinine, Urine: 24.2 mg/dL
Protein, Ur: 45.3 mg/dL
Protein/Creat Ratio: 1872 mg/g creat — ABNORMAL HIGH (ref 0–200)

## 2020-02-10 LAB — COMPREHENSIVE METABOLIC PANEL
ALT: 35 IU/L — ABNORMAL HIGH (ref 0–32)
AST: 22 IU/L (ref 0–40)
Albumin/Globulin Ratio: 1.1 — ABNORMAL LOW (ref 1.2–2.2)
Albumin: 3.2 g/dL — ABNORMAL LOW (ref 3.8–4.8)
Alkaline Phosphatase: 135 IU/L — ABNORMAL HIGH (ref 44–121)
BUN/Creatinine Ratio: 15 (ref 9–23)
BUN: 9 mg/dL (ref 6–20)
Bilirubin Total: <0.2 mg/dL (ref 0.0–1.2)
CO2: 19 mmol/L — ABNORMAL LOW (ref 20–29)
Calcium: 8.9 mg/dL (ref 8.7–10.2)
Chloride: 104 mmol/L (ref 96–106)
Creatinine, Ser: 0.59 mg/dL (ref 0.57–1.00)
GFR calc Af Amer: 141 mL/min/{1.73_m2} (ref >59)
GFR calc non Af Amer: 123 mL/min/{1.73_m2} (ref >59)
Globulin, Total: 2.9 g/dL (ref 1.5–4.5)
Glucose: 79 mg/dL (ref 65–99)
Potassium: 4.6 mmol/L (ref 3.5–5.2)
Sodium: 137 mmol/L (ref 134–144)
Total Protein: 6.1 g/dL (ref 6.0–8.5)

## 2020-02-10 LAB — HEMOGLOBIN A1C
Est. average glucose Bld gHb Est-mCnc: 157 mg/dL
Hgb A1c MFr Bld: 7.1 % — ABNORMAL HIGH (ref 4.8–5.6)

## 2020-02-14 LAB — BETA-HYDROXYBUTYRIC ACID: Beta-Hydroxybutyrate: 0.7 mg/dL

## 2020-02-15 ENCOUNTER — Ambulatory Visit: Payer: Medicaid Other | Admitting: Registered"

## 2020-02-15 ENCOUNTER — Encounter: Payer: Medicaid Other | Attending: Obstetrics & Gynecology | Admitting: Registered"

## 2020-02-15 DIAGNOSIS — O24319 Unspecified pre-existing diabetes mellitus in pregnancy, unspecified trimester: Secondary | ICD-10-CM
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
## 2020-02-16 ENCOUNTER — Other Ambulatory Visit: Payer: Self-pay

## 2020-02-16 ENCOUNTER — Encounter (HOSPITAL_COMMUNITY): Payer: Medicaid Other

## 2020-02-23 ENCOUNTER — Telehealth: Payer: Self-pay | Admitting: Registered"

## 2020-02-23 NOTE — Telephone Encounter (Signed)
RE: [employer] Urgent Medical Device correction notification  [facility] received fax (above) with request for Elsie Lincoln to sign acknowledgement of receipt. Notification given to me Marylene Land) due to the nature of notification and recent visit discussing patient's diabetes management. During initial Video visit (02/15/2020) patient states she does not know how to make adjustments to pump and her Endocrinologist and staff at [facility] take care of this for her.   (02/22/2020) RD called patient regarding the [employer] notification to see if she had also received the notification, but states she had not seen it. Patient provided her Endocrinologist office phone number. RD called and left a detailed message regarding the form since [facility] is managing patient's use of the pump in question.  (02/23/2020) [facility] (Dr. Michaelle Copas office) Diabetes Educator, Teodoro Spray, returned call . They have not received notice, but she will keep eye out for it. Jan said unless patient got a new or replacement pump the settings should be fine. When patients get new pumps she provides training and initial set up. Jan's number is [phone], Valinda Hoar [phone]

## 2020-02-28 ENCOUNTER — Telehealth: Payer: Self-pay

## 2020-02-28 ENCOUNTER — Ambulatory Visit: Payer: Medicaid Other

## 2020-02-28 ENCOUNTER — Telehealth: Payer: Self-pay | Admitting: Family Medicine

## 2020-02-28 NOTE — Telephone Encounter (Signed)
Attempted to reach patient about her appointment change. I was able to send her a message in her MyChart. However, her voice mail is full and I could not leave a voicemail message.

## 2020-02-28 NOTE — Telephone Encounter (Signed)
Got fax from [facility], stating that patient was a no-show to her Fetal Echo appointment on 02/24/20.   Routing this to Dr. Judeth Cornfield to notify him.

## 2020-02-29 ENCOUNTER — Telehealth (INDEPENDENT_AMBULATORY_CARE_PROVIDER_SITE_OTHER): Payer: Medicaid Other | Admitting: Family Medicine

## 2020-02-29 ENCOUNTER — Encounter (HOSPITAL_COMMUNITY): Payer: Self-pay | Admitting: Family Medicine

## 2020-02-29 ENCOUNTER — Other Ambulatory Visit: Payer: Self-pay

## 2020-02-29 ENCOUNTER — Encounter: Payer: Self-pay | Admitting: Family Medicine

## 2020-02-29 ENCOUNTER — Inpatient Hospital Stay (HOSPITAL_COMMUNITY)
Admission: AD | Admit: 2020-02-29 | Discharge: 2020-02-29 | Disposition: A | Discharge status: Home / Self Care | Payer: Medicaid Other | Attending: Family Medicine | Admitting: Family Medicine

## 2020-02-29 DIAGNOSIS — E1065 Type 1 diabetes mellitus with hyperglycemia: Secondary | ICD-10-CM

## 2020-02-29 DIAGNOSIS — O113 Pre-existing hypertension with pre-eclampsia, third trimester: Secondary | ICD-10-CM

## 2020-02-29 DIAGNOSIS — Z7982 Long term (current) use of aspirin: Secondary | ICD-10-CM | POA: Diagnosis not present

## 2020-02-29 DIAGNOSIS — Z79899 Other long term (current) drug therapy: Secondary | ICD-10-CM | POA: Diagnosis not present

## 2020-02-29 DIAGNOSIS — O99333 Smoking (tobacco) complicating pregnancy, third trimester: Secondary | ICD-10-CM | POA: Insufficient documentation

## 2020-02-29 DIAGNOSIS — O24319 Unspecified pre-existing diabetes mellitus in pregnancy, unspecified trimester: Secondary | ICD-10-CM

## 2020-02-29 DIAGNOSIS — O1493 Unspecified pre-eclampsia, third trimester: Secondary | ICD-10-CM | POA: Insufficient documentation

## 2020-02-29 DIAGNOSIS — Z794 Long term (current) use of insulin: Secondary | ICD-10-CM | POA: Insufficient documentation

## 2020-02-29 DIAGNOSIS — O34219 Maternal care for unspecified type scar from previous cesarean delivery: Secondary | ICD-10-CM

## 2020-02-29 DIAGNOSIS — O0993 Supervision of high risk pregnancy, unspecified, third trimester: Secondary | ICD-10-CM

## 2020-02-29 DIAGNOSIS — O1490 Unspecified pre-eclampsia, unspecified trimester: Secondary | ICD-10-CM

## 2020-02-29 DIAGNOSIS — E109 Type 1 diabetes mellitus without complications: Secondary | ICD-10-CM | POA: Insufficient documentation

## 2020-02-29 DIAGNOSIS — O24013 Pre-existing diabetes mellitus, type 1, in pregnancy, third trimester: Secondary | ICD-10-CM

## 2020-02-29 DIAGNOSIS — O99013 Anemia complicating pregnancy, third trimester: Secondary | ICD-10-CM | POA: Diagnosis not present

## 2020-02-29 DIAGNOSIS — Z3A32 32 weeks gestation of pregnancy: Secondary | ICD-10-CM

## 2020-02-29 DIAGNOSIS — O119 Pre-existing hypertension with pre-eclampsia, unspecified trimester: Secondary | ICD-10-CM

## 2020-02-29 DIAGNOSIS — F17210 Nicotine dependence, cigarettes, uncomplicated: Secondary | ICD-10-CM | POA: Insufficient documentation

## 2020-02-29 DIAGNOSIS — O99613 Diseases of the digestive system complicating pregnancy, third trimester: Secondary | ICD-10-CM

## 2020-02-29 DIAGNOSIS — Z3A00 Weeks of gestation of pregnancy not specified: Secondary | ICD-10-CM | POA: Diagnosis not present

## 2020-02-29 DIAGNOSIS — K219 Gastro-esophageal reflux disease without esophagitis: Secondary | ICD-10-CM

## 2020-02-29 DIAGNOSIS — O0990 Supervision of high risk pregnancy, unspecified, unspecified trimester: Secondary | ICD-10-CM

## 2020-02-29 DIAGNOSIS — E10319 Type 1 diabetes mellitus with unspecified diabetic retinopathy without macular edema: Secondary | ICD-10-CM

## 2020-02-29 DIAGNOSIS — D649 Anemia, unspecified: Secondary | ICD-10-CM

## 2020-02-29 LAB — COMPREHENSIVE METABOLIC PANEL
ALT: 32 U/L (ref 0–44)
AST: 21 U/L (ref 15–41)
Albumin: 2.3 g/dL — ABNORMAL LOW (ref 3.5–5.0)
Alkaline Phosphatase: 99 U/L (ref 38–126)
Anion gap: 7 (ref 5–15)
BUN: 8 mg/dL (ref 6–20)
CO2: 20 mmol/L — ABNORMAL LOW (ref 22–32)
Calcium: 8.2 mg/dL — ABNORMAL LOW (ref 8.9–10.3)
Chloride: 106 mmol/L (ref 98–111)
Creatinine, Ser: 0.75 mg/dL (ref 0.44–1.00)
GFR, Estimated: >60 mL/min (ref >60)
Glucose, Bld: 186 mg/dL — ABNORMAL HIGH (ref 70–99)
Potassium: 4 mmol/L (ref 3.5–5.1)
Sodium: 133 mmol/L — ABNORMAL LOW (ref 135–145)
Total Bilirubin: 0.2 mg/dL — ABNORMAL LOW (ref 0.3–1.2)
Total Protein: 5.4 g/dL — ABNORMAL LOW (ref 6.5–8.1)

## 2020-02-29 LAB — URINALYSIS, ROUTINE W REFLEX MICROSCOPIC
Bilirubin Urine: NEGATIVE
Glucose, UA: NEGATIVE mg/dL
Ketones, ur: NEGATIVE mg/dL
Leukocytes,Ua: NEGATIVE
Nitrite: NEGATIVE
Protein, ur: 100 mg/dL — AB
Specific Gravity, Urine: 1.015 (ref 1.005–1.030)
pH: 6 (ref 5.0–8.0)

## 2020-02-29 LAB — CBC
HCT: 27.3 % — ABNORMAL LOW (ref 36.0–46.0)
Hemoglobin: 9.1 g/dL — ABNORMAL LOW (ref 12.0–15.0)
MCH: 28.3 pg (ref 26.0–34.0)
MCHC: 33.3 g/dL (ref 30.0–36.0)
MCV: 84.8 fL (ref 80.0–100.0)
Platelets: 288 10*3/uL (ref 150–400)
RBC: 3.22 MIL/uL — ABNORMAL LOW (ref 3.87–5.11)
RDW: 14.8 % (ref 11.5–15.5)
WBC: 13.3 10*3/uL — ABNORMAL HIGH (ref 4.0–10.5)
nRBC: 0 % (ref 0.0–0.2)

## 2020-02-29 LAB — PROTEIN / CREATININE RATIO, URINE
Creatinine, Urine: 85.06 mg/dL
Protein Creatinine Ratio: 1.88 mg/mg{Cre} — ABNORMAL HIGH (ref 0.00–0.15)
Total Protein, Urine: 160 mg/dL

## 2020-02-29 NOTE — MAU Provider Note (Signed)
History     CSN: [id number]  Arrival date and time: 02/29/20 1716   Event Date/Time   First Provider Initiated Contact with Patient 02/29/20 1819      Chief Complaint  Patient presents with  . Hypertension   HPI  OB History    Gravida  2   Para  1   Term  1   Preterm      AB      Living  1     SAB  0   IAB  0   Ectopic  0   Multiple  0   Live Births  1           Past Medical History:  Diagnosis Date  . Anxiety   . Depression   . Diabetes mellitus type 1 ([place])    Follows with Endo  . GERD (gastroesophageal reflux disease)   . Hypertension   . Tobacco abuse   . Wears glasses     Past Surgical History:  Procedure Laterality Date  . CESAREAN SECTION N/A 02/01/2016   Procedure: CESAREAN SECTION;  Surgeon: Mora Bellman, MD;  Location: [place];  Service: Obstetrics;  Laterality: N/A;  . CESAREAN SECTION    . CHOLECYSTECTOMY N/A 06/16/2019   Procedure: LAPAROSCOPIC CHOLECYSTECTOMY;  Surgeon: Kinsinger, Arta Bruce, MD;  Location: [facility];  Service: General;  Laterality: N/A;  . FOOT SURGERY Left   . TOOTH EXTRACTION  2018    Family History  Problem Relation Age of Onset  . Hypertension Mother   . Depression Mother   . Bipolar disorder Sister   . Drug abuse Sister   . Lupus Brother   . Heart disease Maternal Uncle   . ADD / ADHD Brother   . Sleep apnea Brother   . Colon cancer Neg Hx   . Esophageal cancer Neg Hx   . Rectal cancer Neg Hx   . Stomach cancer Neg Hx     Social History   Tobacco Use  . Smoking status: Current Every Day Smoker    Packs/day: 0.50    Years: 12.00    Pack years: 6.00    Types: Cigarettes  . Smokeless tobacco: Never Used  . Tobacco comment: trying to cut back   Vaping Use  . Vaping Use: Never used  Substance Use Topics  . Alcohol use: Not Currently    Comment: occasionally  . Drug use: No    Allergies: No Known Allergies  Medications Prior to Admission  Medication Sig Dispense Refill Last  Dose  . aspirin EC 81 MG tablet Take 1 tablet (81 mg total) by mouth daily. Take after 12 weeks for prevention of preeclampsia later in pregnancy 300 tablet 2 02/28/2020 at Unknown time  . Blood Pressure Monitoring (BLOOD PRESSURE KIT) DEVI 1 Device by Does not apply route as needed. 1 each 0 02/29/2020 at Unknown time  . ferrous sulfate 325 (65 FE) MG tablet Take 1 tablet (325 mg total) by mouth every other day. 30 tablet 3 Past Week at Unknown time  . Insulin Infusion Pump (MINIMED 630G INSULIN PUMP) KIT by Does not apply route.   02/29/2020 at Unknown time  . labetalol (NORMODYNE) 200 MG tablet Take 1.5 tablets (300 mg total) by mouth 2 (two) times daily. 90 tablet 1 02/29/2020 at Unknown time  . levocetirizine (XYZAL) 5 MG tablet TAKE 1 TABLET BY MOUTH ONCE DAILY IN THE EVENING 30 tablet 0 02/28/2020 at Unknown time  . omeprazole (PRILOSEC) 20 MG  capsule TAKE 1 CAPSULE BY MOUTH TWICE DAILY BEFORE A MEAL 180 capsule 0 02/28/2020 at Unknown time  . pantoprazole (PROTONIX) 40 MG tablet TAKE 1 TABLET BY MOUTH ONCE DAILY 20 TO 30 MINUTES BEFORE SUPPER 30 tablet 3 Past Month at Unknown time  . Prenatal MV-Min-FA-Omega-3 (PRENATAL GUMMIES/DHA & FA PO) Take 1 tablet by mouth daily.   02/29/2020 at Unknown time  . promethazine (PHENERGAN) 25 MG tablet Take 1 tablet (25 mg total) by mouth every 8 (eight) hours as needed for nausea or vomiting (headache). 30 tablet 0 Past Month at Unknown time    Review of Systems  Constitutional: Negative.   HENT: Negative.   Eyes: Negative.   Respiratory: Negative.   Cardiovascular: Negative.   Gastrointestinal: Negative.   Endocrine: Negative.   Genitourinary: Negative.   Musculoskeletal: Negative.   Skin: Negative.   Allergic/Immunologic: Negative.   Neurological: Negative.   Hematological: Negative.   Psychiatric/Behavioral: Negative.    Physical Exam   Patient Vitals for the past 24 hrs:  BP Temp Pulse Resp SpO2  02/29/20 2015 (!) 148/76 -- 80 -- 100 %  02/29/20  1932 140/66 -- 85 -- --  02/29/20 1916 (!) 141/69 -- 83 -- --  02/29/20 1902 130/63 -- 81 -- --  02/29/20 1847 136/68 -- 86 -- --  02/29/20 1831 (!) 147/65 -- 83 -- --  02/29/20 1816 (!) 144/70 -- 86 -- --  02/29/20 1801 138/71 -- 89 -- --  02/29/20 1756 132/64 -- 94 -- --  02/29/20 1751 -- 98 F (36.7 C) -- 20 100 %    Physical Exam Vitals and nursing note reviewed.  Constitutional:      Appearance: Normal appearance. She is obese.  Cardiovascular:     Rate and Rhythm: Normal rate.  Musculoskeletal:        General: Normal range of motion.     Right lower leg: No edema.     Left lower leg: No edema.  Skin:    General: Skin is warm and dry.  Neurological:     Mental Status: She is alert and oriented to person, place, and time.  Psychiatric:        Mood and Affect: Mood normal.        Behavior: Behavior normal.        Thought Content: Thought content normal.        Judgment: Judgment normal.    REASSURING NST - FHR: 125 bpm / moderate variability / accels present / decels absent / TOCO: irregular with UI noted  MAU Course  Procedures  MDM CCUA CBC CMP P/C Ratio Serial BP's   Results for orders placed or performed during the hospital encounter of 02/29/20 (from the past 24 hour(s))  Urinalysis, Routine w reflex microscopic Urine, Clean Catch     Status: Abnormal   Collection Time: 02/29/20  5:21 PM  Result Value Ref Range   Color, Urine YELLOW YELLOW   APPearance CLEAR CLEAR   Specific Gravity, Urine 1.015 1.005 - 1.030   pH 6.0 5.0 - 8.0   Glucose, UA NEGATIVE NEGATIVE mg/dL   Hgb urine dipstick SMALL (A) NEGATIVE   Bilirubin Urine NEGATIVE NEGATIVE   Ketones, ur NEGATIVE NEGATIVE mg/dL   Protein, ur 100 (A) NEGATIVE mg/dL   Nitrite NEGATIVE NEGATIVE   Leukocytes,Ua NEGATIVE NEGATIVE   RBC / HPF 0-5 0 - 5 RBC/hpf   WBC, UA 0-5 0 - 5 WBC/hpf   Bacteria, UA RARE (A) NONE SEEN  Squamous Epithelial / LPF 0-5 0 - 5   Mucus PRESENT   Protein / creatinine  ratio, urine     Status: Abnormal   Collection Time: 02/29/20  6:17 PM  Result Value Ref Range   Creatinine, Urine 85.06 mg/dL   Total Protein, Urine 160 mg/dL   Protein Creatinine Ratio 1.88 (H) 0.00 - 0.15 mg/mg[Cre]  CBC     Status: Abnormal   Collection Time: 02/29/20  6:36 PM  Result Value Ref Range   WBC 13.3 (H) 4.0 - 10.5 K/uL   RBC 3.22 (L) 3.87 - 5.11 MIL/uL   Hemoglobin 9.1 (L) 12.0 - 15.0 g/dL   HCT 27.3 (L) 36.0 - 46.0 %   MCV 84.8 80.0 - 100.0 fL   MCH 28.3 26.0 - 34.0 pg   MCHC 33.3 30.0 - 36.0 g/dL   RDW 14.8 11.5 - 15.5 %   Platelets 288 150 - 400 K/uL   nRBC 0.0 0.0 - 0.2 %  Comprehensive metabolic panel     Status: Abnormal   Collection Time: 02/29/20  6:36 PM  Result Value Ref Range   Sodium 133 (L) 135 - 145 mmol/L   Potassium 4.0 3.5 - 5.1 mmol/L   Chloride 106 98 - 111 mmol/L   CO2 20 (L) 22 - 32 mmol/L   Glucose, Bld 186 (H) 70 - 99 mg/dL   BUN 8 6 - 20 mg/dL   Creatinine, Ser 0.75 0.44 - 1.00 mg/dL   Calcium 8.2 (L) 8.9 - 10.3 mg/dL   Total Protein 5.4 (L) 6.5 - 8.1 g/dL   Albumin 2.3 (L) 3.5 - 5.0 g/dL   AST 21 15 - 41 U/L   ALT 32 0 - 44 U/L   Alkaline Phosphatase 99 38 - 126 U/L   Total Bilirubin 0.2 (L) 0.3 - 1.2 mg/dL   GFR, Estimated >60 >60 mL/min   Anion gap 7 5 - 15     *Consult with Dr. Elly Modena @ 2025 - notified of patient's complaints, assessments, lab & NST results, tx plan d/c home, have BP check in the office, take home BP cuff to next OB visit - ok to d/c home, agrees with plan  Assessment and Plan  Pre-eclampsia in third trimester - Plan: Discharge patient - Advised to have a blood pressure check in the office in 2-3 days and take home BP cuff with her - Information provided on PEC - Reviewed s/sx's of worsening PEC - Advised to continue on Labetalol current dosing - Patient verbalized an understanding of the plan of care and agrees.   Laury Deep, CNM 02/29/2020, 6:49 PM

## 2020-02-29 NOTE — MAU Note (Signed)
Pt reports taking her blood pressure today and it was 163/94. Pt told her doctor what it was and was told to come in for a blood pressure check.   Denies headache, Denies visual changes, denies epigastric pain.   Reports +FM  Denies vaginal bleeding or LOF.

## 2020-02-29 NOTE — Progress Notes (Signed)
I connected with  Molly Savage on 02/29/20 at  2:15 PM EST by telephone and verified that I am speaking with the correct person using two identifiers.   I discussed the limitations, risks, security and privacy concerns of performing an evaluation and management service by telephone and the availability of in person appointments. I also discussed with the patient that there may be a patient responsible charge related to this service. The patient expressed understanding and agreed to proceed.  Isabell Jarvis, RN 02/29/2020  2:35 PM

## 2020-02-29 NOTE — Progress Notes (Signed)
I connected with Molly Savage 02/29/20 at  2:15 PM EST by: MyChart video and verified that I am speaking with the correct person using two identifiers.  Patient is located in her car and provider is located at [facility].     The purpose of this virtual visit is to provide medical care while limiting exposure to the novel coronavirus. I discussed the limitations, risks, security and privacy concerns of performing an evaluation and management service by MyChart video and the availability of in person appointments. I also discussed with the patient that there may be a patient responsible charge related to this service. By engaging in this virtual visit, you consent to the provision of healthcare.  Additionally, you authorize for your insurance to be billed for the services provided during this visit.  The patient expressed understanding and agreed to proceed.  The following staff members participated in the virtual visit:  Venora Maples, MD/MPH    PRENATAL VISIT NOTE  Subjective:  Molly Savage is a 30 y.o. G2P1001 at [redacted]w[redacted]d  for phone visit for ongoing prenatal care.  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); Tobacco abuse; Diabetic foot infection (HCC); BMI 40.0-44.9, adult (HCC); Situational mixed anxiety and depressive disorder; Depression; Chronic hypertension; Lactose intolerance; Morbid obesity (HCC); Callus of foot; Patellofemoral arthralgia of both knees; Left foot pain; Skin lesion of left leg; Granuloma annulare; Poor memory; Allergies; Increased ammonia level; Type 1 diabetes mellitus with hyperglycemia (HCC); Gastroesophageal reflux disease; Type 1 diabetes mellitus with diabetic polyneuropathy (HCC); Supervision of high risk pregnancy, antepartum; Preexisting diabetes complicating pregnancy, antepartum; Chronic hypertension with superimposed preeclampsia; Previous cesarean  delivery x 1, antepartum; Preeclampsia; and Anemia affecting pregnancy in third trimester on their problem list.  Patient reports prolonged cough and sore throat for the past month.  Contractions: Not present. Vag. Bleeding: None.  Movement: Present. Denies leaking of fluid.   The following portions of the patient's history were reviewed and updated as appropriate: allergies, current medications, past family history, past medical history, past social history, past surgical history and problem list.   Objective:   Vitals:   02/29/20 1440  BP: (!) 160/94   Self-Obtained  Fetal Status:     Movement: Present     Assessment and Plan:  Pregnancy: G2P1001 at [redacted]w[redacted]d 1. Anemia affecting pregnancy in third trimester hgb 10.3 last check  2. Pre-eclampsia in third trimester Diagnosed in MAU with new proteinuria and mild range BP's Today reports no symptoms with exception of some LE edema BP on home cuff is 160/94, reports she took her labetalol this morning and that there is often a discrepancy between her home and office readings  On review of chart has usually normal or mild range BP's at office visits Visit today rescheduled as virtual due to screen out for symptoms Instructed patient to go to MAU for evaluation for severe PreE, aware that she may be admitted and monitored until a 34 week delivery if she has severe PreE She will drop her son off at home with her partner and head to MAU  3. Previous cesarean delivery, antepartum Plan for repeat  4. Supervision of high risk pregnancy, antepartum FHR normal  5. Preexisting diabetes complicating pregnancy, antepartum Unable to give firm details of her glycemic control Reports usually under 180 after eating  Missed appt for fetal echo Discussed risks of uncontrolled GDM including fetal cardiac defects and stillbirth Reviewed  she would like be delivered at 37 weeks if GDM control was poor or unknown  6. Chronic hypertension with  superimposed preeclampsia See above  Preterm labor symptoms and general obstetric precautions including but not limited to vaginal bleeding, contractions, leaking of fluid and fetal movement were reviewed in detail with the patient.  Return in 2 weeks (on 03/14/2020).  Future Appointments  Date Time Provider Department Center  03/07/2020  2:30 PM [facility] NURSE [facility]  03/07/2020  2:45 PM [id number]     Time spent on virtual visit: 25 minutes  Venora Maples, MD

## 2020-02-29 NOTE — Patient Instructions (Signed)
 Contraception Choices Contraception, also called birth control, refers to methods or devices that prevent pregnancy. Hormonal methods Contraceptive implant A contraceptive implant is a thin, plastic tube that contains a hormone that prevents pregnancy. It is different from an intrauterine device (IUD). It is inserted into the upper part of the arm by a health care provider. Implants can be effective for up to 3 years. Progestin-only injections Progestin-only injections are injections of progestin, a synthetic form of the hormone progesterone. They are given every 3 months by a health care provider. Birth control pills Birth control pills are pills that contain hormones that prevent pregnancy. They must be taken once a day, preferably at the same time each day. A prescription is needed to use this method of contraception. Birth control patch The birth control patch contains hormones that prevent pregnancy. It is placed on the skin and must be changed once a week for three weeks and removed on the fourth week. A prescription is needed to use this method of contraception. Vaginal ring A vaginal ring contains hormones that prevent pregnancy. It is placed in the vagina for three weeks and removed on the fourth week. After that, the process is repeated with a new ring. A prescription is needed to use this method of contraception. Emergency contraceptive Emergency contraceptives prevent pregnancy after unprotected sex. They come in pill form and can be taken up to 5 days after sex. They work best the sooner they are taken after having sex. Most emergency contraceptives are available without a prescription. This method should not be used as your only form of birth control.   Barrier methods Female condom A female condom is a thin sheath that is worn over the penis during sex. Condoms keep sperm from going inside a woman's body. They can be used with a sperm-killing substance (spermicide) to increase their  effectiveness. They should be thrown away after one use. Female condom A female condom is a soft, loose-fitting sheath that is put into the vagina before sex. The condom keeps sperm from going inside a woman's body. They should be thrown away after one use. Diaphragm A diaphragm is a soft, dome-shaped barrier. It is inserted into the vagina before sex, along with a spermicide. The diaphragm blocks sperm from entering the uterus, and the spermicide kills sperm. A diaphragm should be left in the vagina for 6-8 hours after sex and removed within 24 hours. A diaphragm is prescribed and fitted by a health care provider. A diaphragm should be replaced every 1-2 years, after giving birth, after gaining more than 15 lb (6.8 kg), and after pelvic surgery. Cervical cap A cervical cap is a round, soft latex or plastic cup that fits over the cervix. It is inserted into the vagina before sex, along with spermicide. It blocks sperm from entering the uterus. The cap should be left in place for 6-8 hours after sex and removed within 48 hours. A cervical cap must be prescribed and fitted by a health care provider. It should be replaced every 2 years. Sponge A sponge is a soft, circular piece of polyurethane foam with spermicide in it. The sponge helps block sperm from entering the uterus, and the spermicide kills sperm. To use it, you make it wet and then insert it into the vagina. It should be inserted before sex, left in for at least 6 hours after sex, and removed and thrown away within 30 hours. Spermicides Spermicides are chemicals that kill or block sperm from entering the   cervix and uterus. They can come as a cream, jelly, suppository, foam, or tablet. A spermicide should be inserted into the vagina with an applicator at least 10-15 minutes before sex to allow time for it to work. The process must be repeated every time you have sex. Spermicides do not require a prescription.   Intrauterine  contraception Intrauterine device (IUD) An IUD is a T-shaped device that is put in a woman's uterus. There are two types:  Hormone IUD.This type contains progestin, a synthetic form of the hormone progesterone. This type can stay in place for 3-5 years.  Copper IUD.This type is wrapped in copper wire. It can stay in place for 10 years. Permanent methods of contraception Female tubal ligation In this method, a woman's fallopian tubes are sealed, tied, or blocked during surgery to prevent eggs from traveling to the uterus. Hysteroscopic sterilization In this method, a small, flexible insert is placed into each fallopian tube. The inserts cause scar tissue to form in the fallopian tubes and block them, so sperm cannot reach an egg. The procedure takes about 3 months to be effective. Another form of birth control must be used during those 3 months. Female sterilization This is a procedure to tie off the tubes that carry sperm (vasectomy). After the procedure, the man can still ejaculate fluid (semen). Another form of birth control must be used for 3 months after the procedure. Natural planning methods Natural family planning In this method, a couple does not have sex on days when the woman could become pregnant. Calendar method In this method, the woman keeps track of the length of each menstrual cycle, identifies the days when pregnancy can happen, and does not have sex on those days. Ovulation method In this method, a couple avoids sex during ovulation. Symptothermal method This method involves not having sex during ovulation. The woman typically checks for ovulation by watching changes in her temperature and in the consistency of cervical mucus. Post-ovulation method In this method, a couple waits to have sex until after ovulation. Where to find more information  Centers for Disease Control and Prevention: www.cdc.gov Summary  Contraception, also called birth control, refers to methods or  devices that prevent pregnancy.  Hormonal methods of contraception include implants, injections, pills, patches, vaginal rings, and emergency contraceptives.  Barrier methods of contraception can include female condoms, female condoms, diaphragms, cervical caps, sponges, and spermicides.  There are two types of IUDs (intrauterine devices). An IUD can be put in a woman's uterus to prevent pregnancy for 3-5 years.  Permanent sterilization can be done through a procedure for males and females. Natural family planning methods involve nothaving sex on days when the woman could become pregnant. This information is not intended to replace advice given to you by your health care provider. Make sure you discuss any questions you have with your health care provider. Document Revised: 06/14/2019 Document Reviewed: 06/14/2019 Elsevier Patient Education  2021 [employer]   Breastfeeding  Choosing to breastfeed is one of the best decisions you can make for yourself and your baby. A change in hormones during pregnancy causes your breasts to make breast milk in your milk-producing glands. Hormones prevent breast milk from being released before your baby is born. They also prompt milk flow after birth. Once breastfeeding has begun, thoughts of your baby, as well as his or her sucking or crying, can stimulate the release of milk from your milk-producing glands. Benefits of breastfeeding Research shows that breastfeeding offers many health benefits   for infants and mothers. It also offers a cost-free and convenient way to feed your baby. For your baby  Your first milk (colostrum) helps your baby's digestive system to function better.  Special cells in your milk (antibodies) help your baby to fight off infections.  Breastfed babies are less likely to develop asthma, allergies, obesity, or type 2 diabetes. They are also at lower risk for sudden infant death syndrome (SIDS).  Nutrients in breast milk are better  able to meet your baby's needs compared to infant formula.  Breast milk improves your baby's brain development. For you  Breastfeeding helps to create a very special bond between you and your baby.  Breastfeeding is convenient. Breast milk costs nothing and is always available at the correct temperature.  Breastfeeding helps to burn calories. It helps you to lose the weight that you gained during pregnancy.  Breastfeeding makes your uterus return faster to its size before pregnancy. It also slows bleeding (lochia) after you give birth.  Breastfeeding helps to lower your risk of developing type 2 diabetes, osteoporosis, rheumatoid arthritis, cardiovascular disease, and breast, ovarian, uterine, and endometrial cancer later in life. Breastfeeding basics Starting breastfeeding  Find a comfortable place to sit or lie down, with your neck and back well-supported.  Place a pillow or a rolled-up blanket under your baby to bring him or her to the level of your breast (if you are seated). Nursing pillows are specially designed to help support your arms and your baby while you breastfeed.  Make sure that your baby's tummy (abdomen) is facing your abdomen.  Gently massage your breast. With your fingertips, massage from the outer edges of your breast inward toward the nipple. This encourages milk flow. If your milk flows slowly, you may need to continue this action during the feeding.  Support your breast with 4 fingers underneath and your thumb above your nipple (make the letter "C" with your hand). Make sure your fingers are well away from your nipple and your baby's mouth.  Stroke your baby's lips gently with your finger or nipple.  When your baby's mouth is open wide enough, quickly bring your baby to your breast, placing your entire nipple and as much of the areola as possible into your baby's mouth. The areola is the colored area around your nipple. ? More areola should be visible above your  baby's upper lip than below the lower lip. ? Your baby's lips should be opened and extended outward (flanged) to ensure an adequate, comfortable latch. ? Your baby's tongue should be between his or her lower gum and your breast.  Make sure that your baby's mouth is correctly positioned around your nipple (latched). Your baby's lips should create a seal on your breast and be turned out (everted).  It is common for your baby to suck about 2-3 minutes in order to start the flow of breast milk. Latching Teaching your baby how to latch onto your breast properly is very important. An improper latch can cause nipple pain, decreased milk supply, and poor weight gain in your baby. Also, if your baby is not latched onto your nipple properly, he or she may swallow some air during feeding. This can make your baby fussy. Burping your baby when you switch breasts during the feeding can help to get rid of the air. However, teaching your baby to latch on properly is still the best way to prevent fussiness from swallowing air while breastfeeding. Signs that your baby has successfully latched onto   your nipple  Silent tugging or silent sucking, without causing you pain. Infant's lips should be extended outward (flanged).  Swallowing heard between every 3-4 sucks once your milk has started to flow (after your let-down milk reflex occurs).  Muscle movement above and in front of his or her ears while sucking. Signs that your baby has not successfully latched onto your nipple  Sucking sounds or smacking sounds from your baby while breastfeeding.  Nipple pain. If you think your baby has not latched on correctly, slip your finger into the corner of your baby's mouth to break the suction and place it between your baby's gums. Attempt to start breastfeeding again. Signs of successful breastfeeding Signs from your baby  Your baby will gradually decrease the number of sucks or will completely stop sucking.  Your baby  will fall asleep.  Your baby's body will relax.  Your baby will retain a small amount of milk in his or her mouth.  Your baby will let go of your breast by himself or herself. Signs from you  Breasts that have increased in firmness, weight, and size 1-3 hours after feeding.  Breasts that are softer immediately after breastfeeding.  Increased milk volume, as well as a change in milk consistency and color by the fifth day of breastfeeding.  Nipples that are not sore, cracked, or bleeding. Signs that your baby is getting enough milk  Wetting at least 1-2 diapers during the first 24 hours after birth.  Wetting at least 5-6 diapers every 24 hours for the first week after birth. The urine should be clear or pale yellow by the age of 5 days.  Wetting 6-8 diapers every 24 hours as your baby continues to grow and develop.  At least 3 stools in a 24-hour period by the age of 5 days. The stool should be soft and yellow.  At least 3 stools in a 24-hour period by the age of 7 days. The stool should be seedy and yellow.  No loss of weight greater than 10% of birth weight during the first 3 days of life.  Average weight gain of 4-7 oz (113-198 g) per week after the age of 4 days.  Consistent daily weight gain by the age of 5 days, without weight loss after the age of 2 weeks. After a feeding, your baby may spit up a small amount of milk. This is normal. Breastfeeding frequency and duration Frequent feeding will help you make more milk and can prevent sore nipples and extremely full breasts (breast engorgement). Breastfeed when you feel the need to reduce the fullness of your breasts or when your baby shows signs of hunger. This is called "breastfeeding on demand." Signs that your baby is hungry include:  Increased alertness, activity, or restlessness.  Movement of the head from side to side.  Opening of the mouth when the corner of the mouth or cheek is stroked (rooting).  Increased  sucking sounds, smacking lips, cooing, sighing, or squeaking.  Hand-to-mouth movements and sucking on fingers or hands.  Fussing or crying. Avoid introducing a pacifier to your baby in the first 4-6 weeks after your baby is born. After this time, you may choose to use a pacifier. Research has shown that pacifier use during the first year of a baby's life decreases the risk of sudden infant death syndrome (SIDS). Allow your baby to feed on each breast as long as he or she wants. When your baby unlatches or falls asleep while feeding from the   first breast, offer the second breast. Because newborns are often sleepy in the first few weeks of life, you may need to awaken your baby to get him or her to feed. Breastfeeding times will vary from baby to baby. However, the following rules can serve as a guide to help you make sure that your baby is properly fed:  Newborns (babies 4 weeks of age or younger) may breastfeed every 1-3 hours.  Newborns should not go without breastfeeding for longer than 3 hours during the day or 5 hours during the night.  You should breastfeed your baby a minimum of 8 times in a 24-hour period. Breast milk pumping Pumping and storing breast milk allows you to make sure that your baby is exclusively fed your breast milk, even at times when you are unable to breastfeed. This is especially important if you go back to work while you are still breastfeeding, or if you are not able to be present during feedings. Your lactation consultant can help you find a method of pumping that works best for you and give you guidelines about how long it is safe to store breast milk.      Caring for your breasts while you breastfeed Nipples can become dry, cracked, and sore while breastfeeding. The following recommendations can help keep your breasts moisturized and healthy:  Avoid using soap on your nipples.  Wear a supportive bra designed especially for nursing. Avoid wearing underwire-style  bras or extremely tight bras (sports bras).  Air-dry your nipples for 3-4 minutes after each feeding.  Use only cotton bra pads to absorb leaked breast milk. Leaking of breast milk between feedings is normal.  Use lanolin on your nipples after breastfeeding. Lanolin helps to maintain your skin's normal moisture barrier. Pure lanolin is not harmful (not toxic) to your baby. You may also hand express a few drops of breast milk and gently massage that milk into your nipples and allow the milk to air-dry. In the first few weeks after giving birth, some women experience breast engorgement. Engorgement can make your breasts feel heavy, warm, and tender to the touch. Engorgement peaks within 3-5 days after you give birth. The following recommendations can help to ease engorgement:  Completely empty your breasts while breastfeeding or pumping. You may want to start by applying warm, moist heat (in the shower or with warm, water-soaked hand towels) just before feeding or pumping. This increases circulation and helps the milk flow. If your baby does not completely empty your breasts while breastfeeding, pump any extra milk after he or she is finished.  Apply ice packs to your breasts immediately after breastfeeding or pumping, unless this is too uncomfortable for you. To do this: ? Put ice in a plastic bag. ? Place a towel between your skin and the bag. ? Leave the ice on for 20 minutes, 2-3 times a day.  Make sure that your baby is latched on and positioned properly while breastfeeding. If engorgement persists after 48 hours of following these recommendations, contact your health care provider or a lactation consultant. Overall health care recommendations while breastfeeding  Eat 3 healthy meals and 3 snacks every day. Well-nourished mothers who are breastfeeding need an additional 450-500 calories a day. You can meet this requirement by increasing the amount of a balanced diet that you eat.  Drink  enough water to keep your urine pale yellow or clear.  Rest often, relax, and continue to take your prenatal vitamins to prevent fatigue, stress, and low   vitamin and mineral levels in your body (nutrient deficiencies).  Do not use any products that contain nicotine or tobacco, such as cigarettes and e-cigarettes. Your baby may be harmed by chemicals from cigarettes that pass into breast milk and exposure to secondhand smoke. If you need help quitting, ask your health care provider.  Avoid alcohol.  Do not use illegal drugs or marijuana.  Talk with your health care provider before taking any medicines. These include over-the-counter and prescription medicines as well as vitamins and herbal supplements. Some medicines that may be harmful to your baby can pass through breast milk.  It is possible to become pregnant while breastfeeding. If birth control is desired, ask your health care provider about options that will be safe while breastfeeding your baby. Where to find more information: [employer]: www.llli.org Contact a health care provider if:  You feel like you want to stop breastfeeding or have become frustrated with breastfeeding.  Your nipples are cracked or bleeding.  Your breasts are red, tender, or warm.  You have: ? Painful breasts or nipples. ? A swollen area on either breast. ? A fever or chills. ? Nausea or vomiting. ? Drainage other than breast milk from your nipples.  Your breasts do not become full before feedings by the fifth day after you give birth.  You feel sad and depressed.  Your baby is: ? Too sleepy to eat well. ? Having trouble sleeping. ? More than 1 week old and wetting fewer than 6 diapers in a 24-hour period. ? Not gaining weight by 5 days of age.  Your baby has fewer than 3 stools in a 24-hour period.  Your baby's skin or the white parts of his or her eyes become yellow. Get help right away if:  Your baby is overly tired  (lethargic) and does not want to wake up and feed.  Your baby develops an unexplained fever. Summary  Breastfeeding offers many health benefits for infant and mothers.  Try to breastfeed your infant when he or she shows early signs of hunger.  Gently tickle or stroke your baby's lips with your finger or nipple to allow the baby to open his or her mouth. Bring the baby to your breast. Make sure that much of the areola is in your baby's mouth. Offer one side and burp the baby before you offer the other side.  Talk with your health care provider or lactation consultant if you have questions or you face problems as you breastfeed. This information is not intended to replace advice given to you by your health care provider. Make sure you discuss any questions you have with your health care provider. Document Revised: 04/03/2017 Document Reviewed: 02/09/2016 Elsevier Patient Education  2021 [employer].  

## 2020-03-01 ENCOUNTER — Telehealth: Payer: Self-pay | Admitting: Family Medicine

## 2020-03-01 NOTE — Telephone Encounter (Signed)
Called pt several times, no answer, voicemail box full. I sent pt a Mychart message regarding appt tomorrow. Unable to reach pt,

## 2020-03-02 ENCOUNTER — Ambulatory Visit: Payer: Medicaid Other

## 2020-03-07 ENCOUNTER — Encounter: Payer: Self-pay | Admitting: *Deleted

## 2020-03-07 ENCOUNTER — Ambulatory Visit: Payer: Medicaid Other | Attending: Obstetrics

## 2020-03-07 ENCOUNTER — Ambulatory Visit: Payer: Medicaid Other | Admitting: *Deleted

## 2020-03-07 ENCOUNTER — Other Ambulatory Visit: Payer: Self-pay

## 2020-03-07 DIAGNOSIS — O24313 Unspecified pre-existing diabetes mellitus in pregnancy, third trimester: Secondary | ICD-10-CM | POA: Diagnosis not present

## 2020-03-07 DIAGNOSIS — O0990 Supervision of high risk pregnancy, unspecified, unspecified trimester: Secondary | ICD-10-CM | POA: Insufficient documentation

## 2020-03-07 DIAGNOSIS — O119 Pre-existing hypertension with pre-eclampsia, unspecified trimester: Secondary | ICD-10-CM | POA: Insufficient documentation

## 2020-03-07 DIAGNOSIS — Z362 Encounter for other antenatal screening follow-up: Secondary | ICD-10-CM | POA: Insufficient documentation

## 2020-03-07 DIAGNOSIS — E669 Obesity, unspecified: Secondary | ICD-10-CM

## 2020-03-07 DIAGNOSIS — Z3A33 33 weeks gestation of pregnancy: Secondary | ICD-10-CM

## 2020-03-07 DIAGNOSIS — O99013 Anemia complicating pregnancy, third trimester: Secondary | ICD-10-CM

## 2020-03-07 DIAGNOSIS — O24319 Unspecified pre-existing diabetes mellitus in pregnancy, unspecified trimester: Secondary | ICD-10-CM | POA: Diagnosis present

## 2020-03-07 DIAGNOSIS — O99333 Smoking (tobacco) complicating pregnancy, third trimester: Secondary | ICD-10-CM | POA: Diagnosis not present

## 2020-03-07 DIAGNOSIS — O34219 Maternal care for unspecified type scar from previous cesarean delivery: Secondary | ICD-10-CM

## 2020-03-07 DIAGNOSIS — O99343 Other mental disorders complicating pregnancy, third trimester: Secondary | ICD-10-CM

## 2020-03-07 DIAGNOSIS — F418 Other specified anxiety disorders: Secondary | ICD-10-CM

## 2020-03-07 DIAGNOSIS — O99213 Obesity complicating pregnancy, third trimester: Secondary | ICD-10-CM

## 2020-03-07 DIAGNOSIS — O10013 Pre-existing essential hypertension complicating pregnancy, third trimester: Secondary | ICD-10-CM | POA: Diagnosis not present

## 2020-03-07 IMAGING — US US MFM FETAL BPP W/O NON-STRESS
1 series · 13 of 24 positions shown · non-contrast
Comparison: none

[Series 1: us mfm fetal bpp w/o non-stress · 24 acquisitions, 13 frames shown]
[im 1/24]
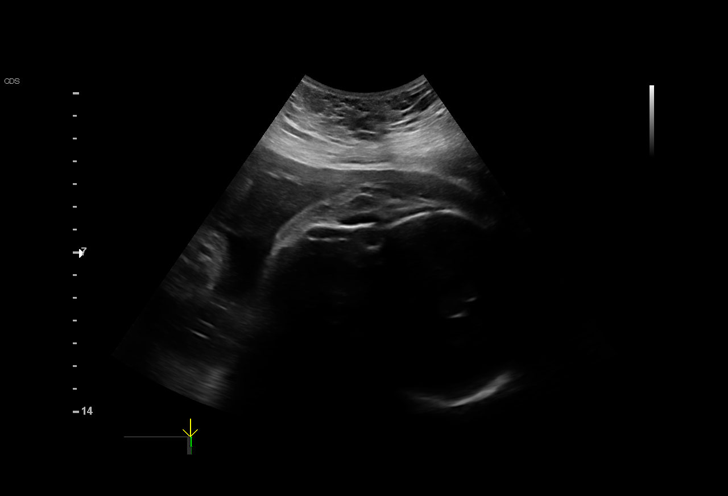
[im 3/24]
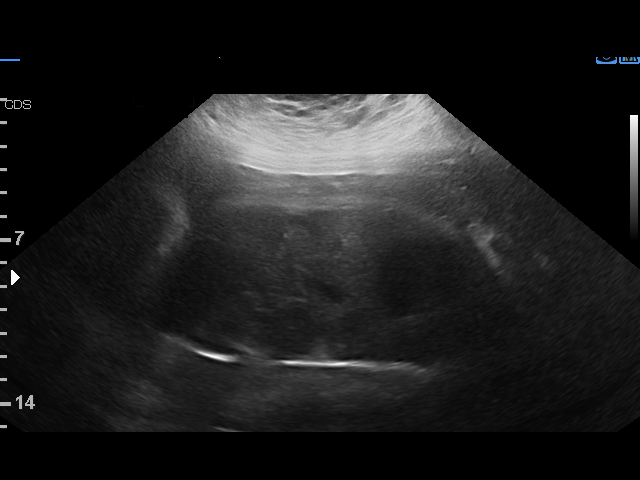
[im 5/24]
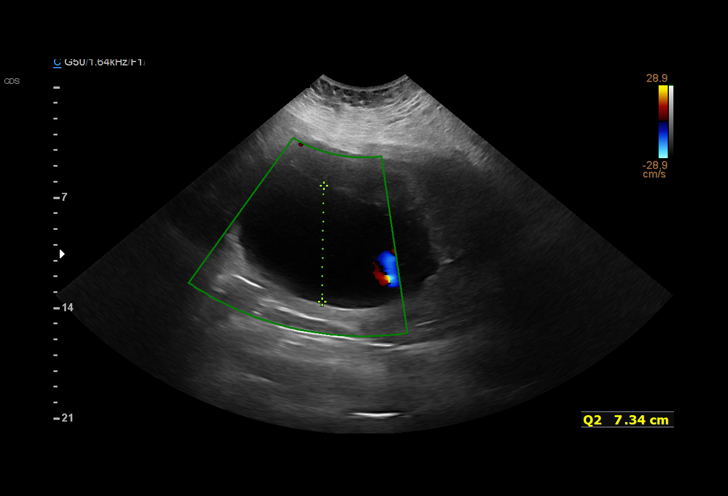
[im 7/24]
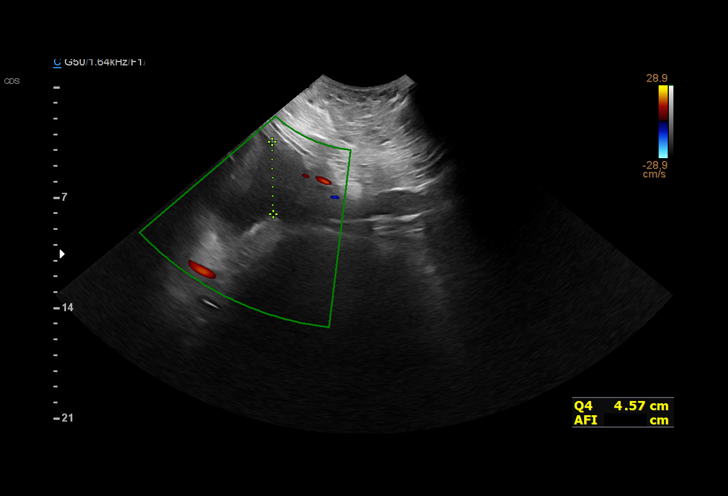
[im 9/24]
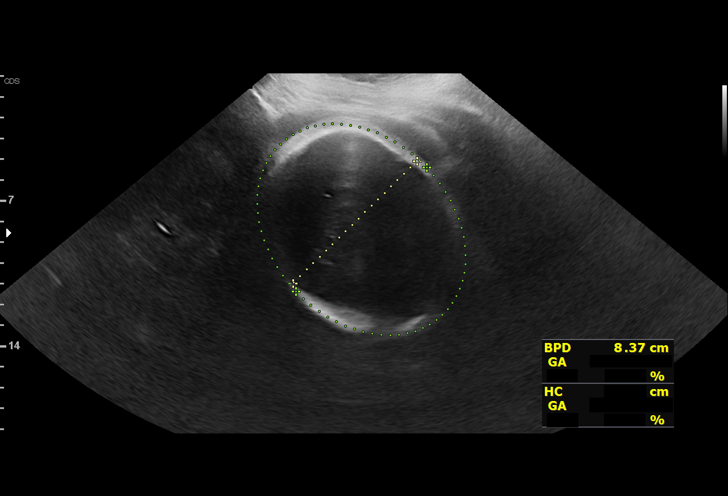
[im 11/24]
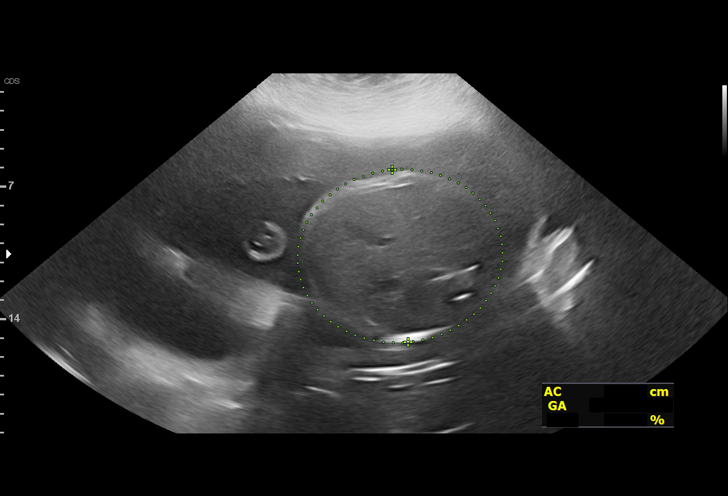
[im 13/24]
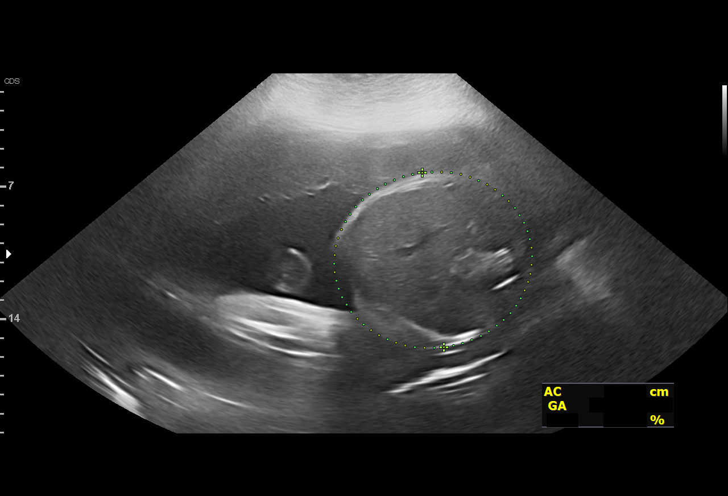
[im 14/24]
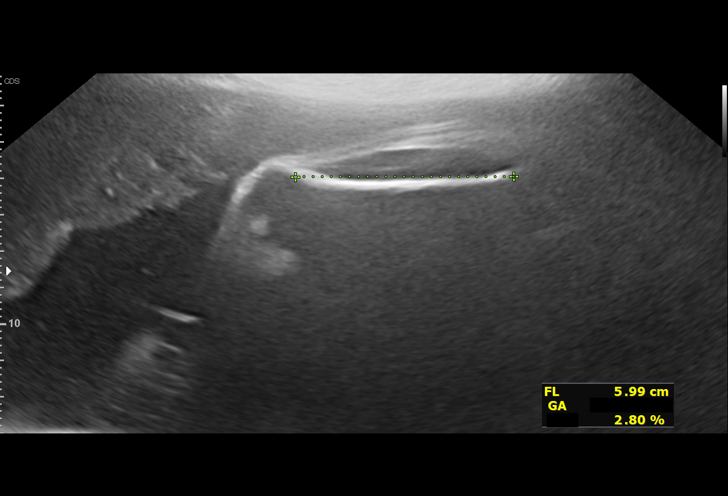
[im 16/24]
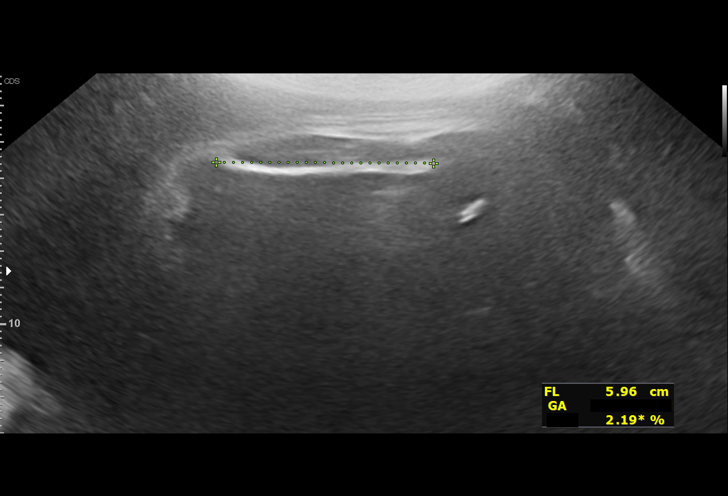
[im 18/24]
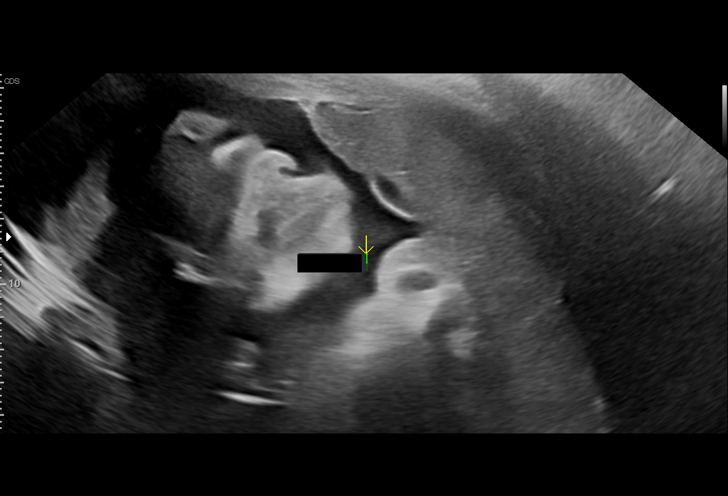
[im 20/24]
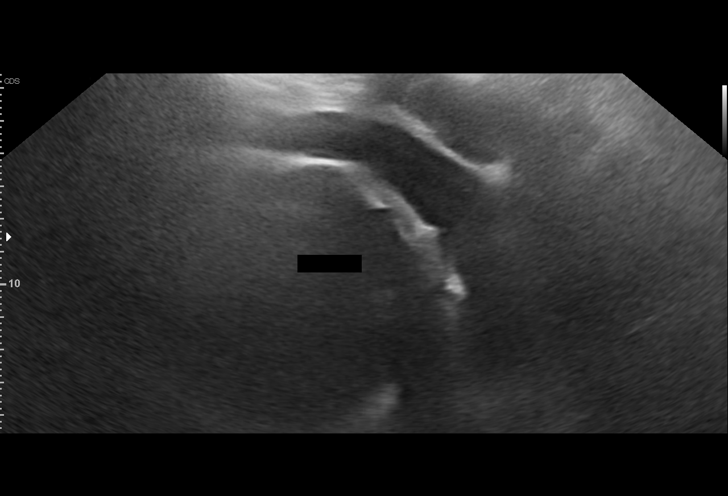
[im 22/24]
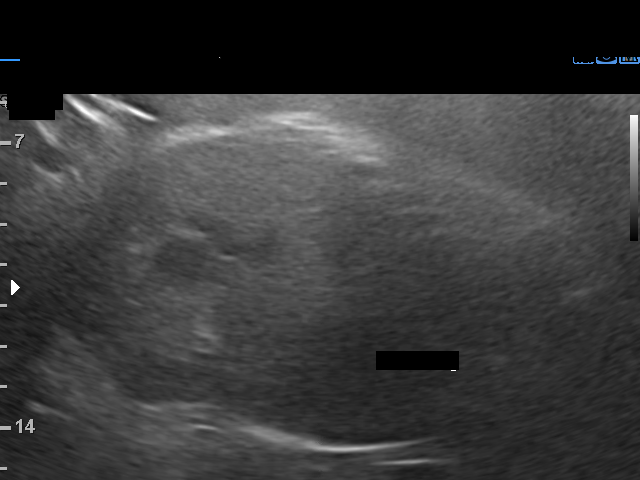
[im 24/24]
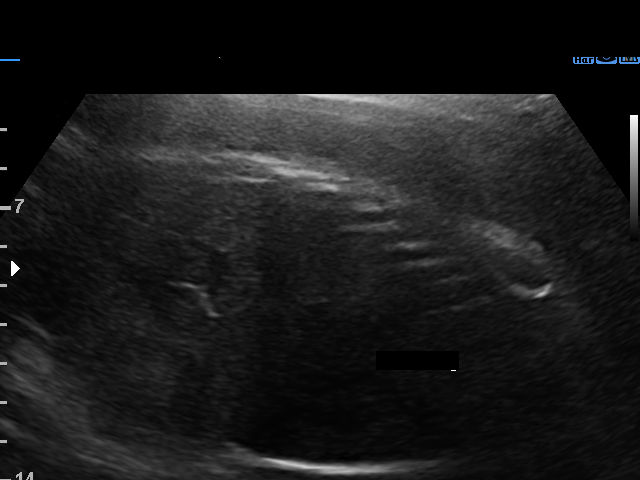

[13 of 24 positions shown; findings below may reference images not displayed]

Indications

 Diabetes - Pregestational, 2nd trimester       [UO]
 33 weeks gestation of pregnancy
 Hypertension - Chronic/Pre-existing            [UO]
 Tobacco use complicating pregnancy,            [UO]
 second trimester
 Medical complication of pregnancy (Anxiety/    [UO]
 Depression)
 History of cesarean delivery, currently        [UO]
 pregnant
 Obesity complicating pregnancy, second         [UO]
 trimester (BMI 40)
Fetal Evaluation

 Num Of Fetuses:         1
 Cardiac Activity:       Observed
 Presentation:           Cephalic
 Placenta:               Left lateral
 P. Cord Insertion:      Previously Visualized

 Amniotic Fluid
 AFI FV:      Within normal limits

 AFI Sum(cm)     %Tile       Largest Pocket(cm)
 21.12           80
 RUQ(cm)       RLQ(cm)       LUQ(cm)        LLQ(cm)

Biophysical Evaluation

 Amniotic F.V:   Pocket => 2 cm             F. Tone:        Observed
 F. Movement:    Observed                   Score:          [DATE]
 F. Breathing:   Observed
Biometry

 BPD:      81.3  mm     G. Age:  32w 5d         23  %    CI:        71.52   %    70 - 86
                                                         FL/HC:      19.6   %    19.9 -
 HC:      306.1  mm     G. Age:  34w 1d         29  %    HC/AC:      0.99        0.96 -
 AC:      308.7  mm     G. Age:  34w 6d         87  %    FL/BPD:     73.9   %    71 - 87
 FL:       60.1  mm     G. Age:  31w 2d        3.2  %    FL/AC:      19.5   %    20 - 24

 LV:        6.1  mm

 Est. FW:    [UO]  gm    4 lb 14 oz      46  %
OB History

 Blood Type:   O+
 Gravidity:    2         Term:   1        Prem:   0        SAB:   0
 TOP:          0       Ectopic:  0        Living: 1
Gestational Age

 LMP:           33w 3d        Date:  [DATE]                 EDD:   [DATE]
 U/S Today:     33w 2d                                        EDD:   [DATE]
 Best:          33w 3d     Det. By:  LMP  ([DATE])          EDD:   [DATE]
Anatomy

 Cranium:               Appears normal         Aortic Arch:            Not well visualized
 Cavum:                 Appears normal         Ductal Arch:            Not well visualized
 Ventricles:            Appears normal         Diaphragm:              Appears normal
 Choroid Plexus:        Not well visualized    Stomach:                Appears normal, left
                                                                       sided
 Cerebellum:            Not well visualized    Abdomen:                Appears normal
 Posterior Fossa:       Not well visualized    Abdominal Wall:         Appears nml (cord
                                                                       insert, abd wall)
 Nuchal Fold:           Not applicable (>20    Cord Vessels:           Appears normal (3
                        wks GA)                                        vessel cord)
 Face:                  Orbits appear          Kidneys:                Appear normal
                        normal
 Lips:                  Appears normal         Bladder:                Appears normal
 Thoracic:              Appears normal         Spine:                  Not well visualized
 Heart:                 Not well visualized    Upper Extremities:      Visualized Limited
 RVOT:                  Not well visualized    Lower Extremities:      Visualized Limited
 LVOT:                  Not well visualized

 Other:  Fetus appears to be female.
Cervix Uterus Adnexa

 Cervix
 Not visualized (advanced GA >[UO])
 Right Ovary
 Not visualized.

 Left Ovary
 Not visualized.
Comments

 This patient was seen for a follow up growth scan due to
 chronic hypertension treated with labetalol, pregestational
 diabetes, and maternal obesity. She currently has an insulin
 pump in place. She has not had a fetal echocardiogram
 performed as she reports that she missed her appointment
 and was not rescheduled for one. She denies any problems
 since her last exam.
 She was informed that the fetal growth and amniotic fluid
 level appears appropriate for her gestational age.
 A biophysical profile performed today was [DATE].
 The patient was advised that she should have her baby
 examined after delivery to ensure that no cardiac defects are
 present.
 Another biophysical profile was scheduled in 1 week.

## 2020-03-08 ENCOUNTER — Other Ambulatory Visit: Payer: Self-pay | Admitting: *Deleted

## 2020-03-08 DIAGNOSIS — O10913 Unspecified pre-existing hypertension complicating pregnancy, third trimester: Secondary | ICD-10-CM

## 2020-03-14 ENCOUNTER — Other Ambulatory Visit: Payer: Medicaid Other

## 2020-03-14 ENCOUNTER — Ambulatory Visit: Payer: Medicaid Other

## 2020-03-16 ENCOUNTER — Telehealth (INDEPENDENT_AMBULATORY_CARE_PROVIDER_SITE_OTHER): Payer: Medicaid Other | Admitting: Family Medicine

## 2020-03-16 DIAGNOSIS — O99013 Anemia complicating pregnancy, third trimester: Secondary | ICD-10-CM

## 2020-03-16 DIAGNOSIS — O119 Pre-existing hypertension with pre-eclampsia, unspecified trimester: Secondary | ICD-10-CM

## 2020-03-16 DIAGNOSIS — O0990 Supervision of high risk pregnancy, unspecified, unspecified trimester: Secondary | ICD-10-CM

## 2020-03-16 DIAGNOSIS — E10319 Type 1 diabetes mellitus with unspecified diabetic retinopathy without macular edema: Secondary | ICD-10-CM

## 2020-03-16 DIAGNOSIS — O1493 Unspecified pre-eclampsia, third trimester: Secondary | ICD-10-CM

## 2020-03-16 DIAGNOSIS — O24319 Unspecified pre-existing diabetes mellitus in pregnancy, unspecified trimester: Secondary | ICD-10-CM

## 2020-03-16 DIAGNOSIS — O34219 Maternal care for unspecified type scar from previous cesarean delivery: Secondary | ICD-10-CM

## 2020-03-16 NOTE — Progress Notes (Signed)
VIRTUAL PRENATAL VISIT NOTE  I connected with [name] on 03/16/20 at 1051 by MyChartand verified that I am speaking with the correct person using two identifiers. Patient is located at home and provider is located at [facility]  I discussed the limitations, risks, security and privacy concerns of performing an evaluation and management service by telephone and the availability of in person appointments. I also discussed with the patient that there may be a patient responsible charge related to this service. The patient expressed understanding and agreed to proceed.  Clinical saff involved: Maxwell Marion    Subjective:  Molly Savage is a 30 y.o. G2P1001 at [redacted]w[redacted]d being seen today for ongoing prenatal care.  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); Tobacco abuse; Diabetic foot infection (HCC); BMI 40.0-44.9, adult (HCC); Situational mixed anxiety and depressive disorder; Depression; Chronic hypertension; Lactose intolerance; Morbid obesity (HCC); Callus of foot; Patellofemoral arthralgia of both knees; Left foot pain; Skin lesion of left leg; Granuloma annulare; Poor memory; Allergies; Increased ammonia level; Type 1 diabetes mellitus with hyperglycemia (HCC); Gastroesophageal reflux disease; Type 1 diabetes mellitus with diabetic polyneuropathy (HCC); Supervision of high risk pregnancy, antepartum; Preexisting diabetes complicating pregnancy, antepartum; Chronic hypertension with superimposed preeclampsia; Previous cesarean delivery x 1, antepartum; Preeclampsia; and Anemia affecting pregnancy in third trimester on their problem list.  Patient reports no complaints.  Contractions: Not present. Vag. Bleeding: None.  Movement: Present. Denies leaking of fluid.   The following portions of the patient's history were reviewed and updated as appropriate: allergies, current medications, past  family history, past medical history, past social history, past surgical history and problem list.   Objective:  There were no vitals filed for this visit.  Fetal Status:     Movement: Present     General:  Alert, oriented and cooperative. Patient is in no acute distress.  Skin: Skin is warm and dry. No rash noted.   Cardiovascular: Normal heart rate noted  Respiratory: Normal respiratory effort, no problems with respiration noted  Abdomen: Soft, gravid, appropriate for gestational age.  Pain/Pressure: Present     Pelvic: Cervical exam deferred        Extremities: Normal range of motion.  Edema: Other (Comment)  Mental Status: Normal mood and affect. Normal behavior. Normal judgment and thought content.   Assessment and Plan:  Pregnancy: G2P1001 at [redacted]w[redacted]d  1. Anemia affecting pregnancy in third trimester Did not get infusions-- hgb 8.8 Would like infusion on 3/1 if possible- AM better, could do tomorrow after 10 AM, 2/28  Turns in car 3/2 and will have less reliable transportation Shared transportation number with patient for any future issues with transportation [name] attempting to make infusion appts  Stressed to patient that increasing her Fe stores is important for her delivery  2. Pre-eclampsia in third trimester Dx at [facility] by mild range BP in 3rd trimester Delivery at 37 weeks, patient unsure of TOLAC vs RCS Patient is NOT appropriate for virtual without BP cuff and virtual is better than no visit. However I have no way to determine her BP today.  Denies HA, vision changes, RUQ pain  3. Previous cesarean delivery, antepartum Thinking about TOLAC  4. Supervision of high risk pregnancy, antepartum Reviewed plan of care and attempted to clarify timing of delivery for patient  5. Preexisting diabetes complicating pregnancy, antepartum Fetal ECHO never done- She "never got a call" Fastings 100s, never over 150  Cannot tell me excactly what her sugars are  EFW@33  wks  46th% Has all antenatal surveillance BPPs scheduled through march 22  6. Chronic hypertension with superimposed preeclampsia Does not have BP cuff at home that is functioning well Delivery at 37 weeks recommended   Preterm labor symptoms and general obstetric precautions including but not limited to vaginal bleeding, contractions, leaking of fluid and fetal movement were reviewed in detail with the patient. Please refer to After Visit Summary for other counseling recommendations.   Return in about 1 week (around 03/23/2020) for Routine prenatal care, in person, MD only.  Future Appointments  Date Time Provider Department Center  03/21/2020  3:15 PM [facility] NURSE [facility]  03/21/2020  3:30 PM [facility] US3 [facility]  03/28/2020  3:15 PM [facility] NURSE [facility]  03/28/2020  3:30 PM [facility] US3 [facility]  04/04/2020  1:45 PM [facility] NURSE [facility]  04/04/2020  2:00 PM [facility] US1 [facility]  04/11/2020  2:15 PM [facility] NURSE [facility]  04/11/2020  2:30 PM [facility] US3 [facility]    Federico Flake, MD

## 2020-03-16 NOTE — Progress Notes (Signed)
I connected with  Demeka Sutter on 03/16/20 at 1051 by MyChart and verified that I am speaking with the correct person using two identifiers.   I discussed the limitations, risks, security and privacy concerns of performing an evaluation and management service by telephone and the availability of in person appointments. I also discussed with the patient that there may be a patient responsible charge related to this service. The patient expressed understanding and agreed to proceed.  Pt reports her legs are very swollen, socks leave an indention that goes away after a while. Cannot recall where blood pressure cuff is, pt states her blood pressure cuff is not accurate. States she has been sent to the [facility] for evaluation due to her BP cuff being innaccurate. PHQ-9 positive today; positive SI. Pt denies any plan for harm. States she feels like it would be better if she was not here only when the pregnancy is making her feel terrible.  Marjo Bicker, RN 03/16/2020  10:50 AM

## 2020-03-17 ENCOUNTER — Encounter: Payer: Self-pay | Admitting: Family Medicine

## 2020-03-21 ENCOUNTER — Ambulatory Visit: Payer: Medicaid Other | Attending: Obstetrics

## 2020-03-21 ENCOUNTER — Other Ambulatory Visit: Payer: Self-pay

## 2020-03-21 ENCOUNTER — Encounter: Payer: Self-pay | Admitting: *Deleted

## 2020-03-21 ENCOUNTER — Ambulatory Visit: Payer: Medicaid Other | Admitting: *Deleted

## 2020-03-21 DIAGNOSIS — O24013 Pre-existing diabetes mellitus, type 1, in pregnancy, third trimester: Secondary | ICD-10-CM | POA: Diagnosis not present

## 2020-03-21 DIAGNOSIS — O10913 Unspecified pre-existing hypertension complicating pregnancy, third trimester: Secondary | ICD-10-CM | POA: Insufficient documentation

## 2020-03-21 DIAGNOSIS — O99343 Other mental disorders complicating pregnancy, third trimester: Secondary | ICD-10-CM | POA: Diagnosis not present

## 2020-03-21 DIAGNOSIS — O0990 Supervision of high risk pregnancy, unspecified, unspecified trimester: Secondary | ICD-10-CM | POA: Insufficient documentation

## 2020-03-21 DIAGNOSIS — O10013 Pre-existing essential hypertension complicating pregnancy, third trimester: Secondary | ICD-10-CM

## 2020-03-21 DIAGNOSIS — O99013 Anemia complicating pregnancy, third trimester: Secondary | ICD-10-CM

## 2020-03-21 DIAGNOSIS — F413 Other mixed anxiety disorders: Secondary | ICD-10-CM | POA: Diagnosis not present

## 2020-03-21 DIAGNOSIS — O34219 Maternal care for unspecified type scar from previous cesarean delivery: Secondary | ICD-10-CM | POA: Diagnosis present

## 2020-03-21 DIAGNOSIS — O119 Pre-existing hypertension with pre-eclampsia, unspecified trimester: Secondary | ICD-10-CM

## 2020-03-21 DIAGNOSIS — E669 Obesity, unspecified: Secondary | ICD-10-CM

## 2020-03-21 DIAGNOSIS — D649 Anemia, unspecified: Secondary | ICD-10-CM

## 2020-03-21 DIAGNOSIS — O24319 Unspecified pre-existing diabetes mellitus in pregnancy, unspecified trimester: Secondary | ICD-10-CM | POA: Insufficient documentation

## 2020-03-21 DIAGNOSIS — O99213 Obesity complicating pregnancy, third trimester: Secondary | ICD-10-CM

## 2020-03-21 DIAGNOSIS — O99333 Smoking (tobacco) complicating pregnancy, third trimester: Secondary | ICD-10-CM

## 2020-03-21 DIAGNOSIS — F17200 Nicotine dependence, unspecified, uncomplicated: Secondary | ICD-10-CM

## 2020-03-21 DIAGNOSIS — Z3A35 35 weeks gestation of pregnancy: Secondary | ICD-10-CM

## 2020-03-21 NOTE — Progress Notes (Signed)
States she has not felt fetal movement since last night around 11PM.

## 2020-03-24 ENCOUNTER — Telehealth: Payer: Self-pay | Admitting: Family Medicine

## 2020-03-24 NOTE — Telephone Encounter (Signed)
Medication:insulin aspart (novoLOG) injection 3 Units  [[id number]       Has the patient contacted their pharmacy? no (If no, request that the patient contact the pharmacy for the refill.) (If yes, when and what did the pharmacy advise?)   Preferred Pharmacy (with phone number or street name):   [address] [place], [place] [address] [phone]   Agent: Please be advised that RX refills may take up to 3 business days. We ask that you follow-up with your pharmacy.

## 2020-03-24 NOTE — Telephone Encounter (Signed)
Patient said she have not heard anything about her getting her iron infusion and she is very concerned

## 2020-03-24 NOTE — Telephone Encounter (Signed)
Addressed in another encounter

## 2020-03-27 ENCOUNTER — Ambulatory Visit (HOSPITAL_COMMUNITY)
Admission: RE | Admit: 2020-03-27 | Discharge: 2020-03-27 | Disposition: A | Discharge status: Home / Self Care | Payer: Medicaid Other | Source: Ambulatory Visit | Attending: Women's Health | Admitting: Women's Health

## 2020-03-27 ENCOUNTER — Other Ambulatory Visit: Payer: Self-pay

## 2020-03-27 DIAGNOSIS — O99019 Anemia complicating pregnancy, unspecified trimester: Secondary | ICD-10-CM | POA: Diagnosis not present

## 2020-03-27 MED ORDER — SODIUM CHLORIDE 0.9 % IV SOLN
510.0000 mg | INTRAVENOUS | Status: DC
  Administered 2020-03-27: 510 mg via INTRAVENOUS
  Filled 2020-03-27: qty 17

## 2020-03-27 NOTE — Telephone Encounter (Signed)
She sees endo

## 2020-03-27 NOTE — Telephone Encounter (Signed)
Do you still manage pts insulin?

## 2020-03-27 NOTE — Discharge Instructions (Signed)
Ferumoxytol injection What is this medicine? FERUMOXYTOL is an iron complex. Iron is used to make healthy red blood cells, which carry oxygen and nutrients throughout the body. This medicine is used to treat iron deficiency anemia. This medicine may be used for other purposes; ask your health care provider or pharmacist if you have questions. COMMON BRAND NAME(S): Feraheme What should I tell my health care provider before I take this medicine? They need to know if you have any of these conditions:  anemia not caused by low iron levels  high levels of iron in the blood  magnetic resonance imaging (MRI) test scheduled  an unusual or allergic reaction to iron, other medicines, foods, dyes, or preservatives  pregnant or trying to get pregnant  breast-feeding How should I use this medicine? This medicine is for injection into a vein. It is given by a health care professional in a hospital or clinic setting. Talk to your pediatrician regarding the use of this medicine in children. Special care may be needed. Overdosage: If you think you have taken too much of this medicine contact a poison control center or emergency room at once. NOTE: This medicine is only for you. Do not share this medicine with others. What if I miss a dose? It is important not to miss your dose. Call your doctor or health care professional if you are unable to keep an appointment. What may interact with this medicine? This medicine may interact with the following medications:  other iron products This list may not describe all possible interactions. Give your health care provider a list of all the medicines, herbs, non-prescription drugs, or dietary supplements you use. Also tell them if you smoke, drink alcohol, or use illegal drugs. Some items may interact with your medicine. What should I watch for while using this medicine? Visit your doctor or healthcare professional regularly. Tell your doctor or healthcare  professional if your symptoms do not start to get better or if they get worse. You may need blood work done while you are taking this medicine. You may need to follow a special diet. Talk to your doctor. Foods that contain iron include: whole grains/cereals, dried fruits, beans, or peas, leafy green vegetables, and organ meats (liver, kidney). What side effects may I notice from receiving this medicine? Side effects that you should report to your doctor or health care professional as soon as possible:  allergic reactions like skin rash, itching or hives, swelling of the face, lips, or tongue  breathing problems  changes in blood pressure  feeling faint or lightheaded, falls  fever or chills  flushing, sweating, or hot feelings  swelling of the ankles or feet Side effects that usually do not require medical attention (report to your doctor or health care professional if they continue or are bothersome):  diarrhea  headache  nausea, vomiting  stomach pain This list may not describe all possible side effects. Call your doctor for medical advice about side effects. You may report side effects to FDA at 1-800-FDA-1088. Where should I keep my medicine? This drug is given in a hospital or clinic and will not be stored at home. NOTE: This sheet is a summary. It may not cover all possible information. If you have questions about this medicine, talk to your doctor, pharmacist, or health care provider.  2021 Elsevier/Gold Standard (2016-02-26 20:21:10)  

## 2020-03-28 ENCOUNTER — Ambulatory Visit (INDEPENDENT_AMBULATORY_CARE_PROVIDER_SITE_OTHER): Payer: Medicaid Other | Admitting: Family Medicine

## 2020-03-28 ENCOUNTER — Ambulatory Visit: Payer: Medicaid Other | Admitting: *Deleted

## 2020-03-28 ENCOUNTER — Other Ambulatory Visit: Payer: Self-pay

## 2020-03-28 ENCOUNTER — Encounter: Payer: Self-pay | Admitting: Family Medicine

## 2020-03-28 ENCOUNTER — Ambulatory Visit: Payer: Medicaid Other | Attending: Obstetrics

## 2020-03-28 ENCOUNTER — Encounter: Payer: Self-pay | Admitting: *Deleted

## 2020-03-28 VITALS — BP 129/78 | HR 90 | Wt 309.1 lb

## 2020-03-28 DIAGNOSIS — O99013 Anemia complicating pregnancy, third trimester: Secondary | ICD-10-CM | POA: Diagnosis present

## 2020-03-28 DIAGNOSIS — O119 Pre-existing hypertension with pre-eclampsia, unspecified trimester: Secondary | ICD-10-CM | POA: Insufficient documentation

## 2020-03-28 DIAGNOSIS — O10913 Unspecified pre-existing hypertension complicating pregnancy, third trimester: Secondary | ICD-10-CM | POA: Diagnosis present

## 2020-03-28 DIAGNOSIS — Z9641 Presence of insulin pump (external) (internal): Secondary | ICD-10-CM | POA: Diagnosis not present

## 2020-03-28 DIAGNOSIS — D649 Anemia, unspecified: Secondary | ICD-10-CM

## 2020-03-28 DIAGNOSIS — O34219 Maternal care for unspecified type scar from previous cesarean delivery: Secondary | ICD-10-CM | POA: Insufficient documentation

## 2020-03-28 DIAGNOSIS — O99213 Obesity complicating pregnancy, third trimester: Secondary | ICD-10-CM

## 2020-03-28 DIAGNOSIS — O24013 Pre-existing diabetes mellitus, type 1, in pregnancy, third trimester: Secondary | ICD-10-CM | POA: Diagnosis not present

## 2020-03-28 DIAGNOSIS — F419 Anxiety disorder, unspecified: Secondary | ICD-10-CM

## 2020-03-28 DIAGNOSIS — O99333 Smoking (tobacco) complicating pregnancy, third trimester: Secondary | ICD-10-CM

## 2020-03-28 DIAGNOSIS — E669 Obesity, unspecified: Secondary | ICD-10-CM

## 2020-03-28 DIAGNOSIS — O10013 Pre-existing essential hypertension complicating pregnancy, third trimester: Secondary | ICD-10-CM | POA: Diagnosis not present

## 2020-03-28 DIAGNOSIS — Z3A36 36 weeks gestation of pregnancy: Secondary | ICD-10-CM

## 2020-03-28 DIAGNOSIS — O24319 Unspecified pre-existing diabetes mellitus in pregnancy, unspecified trimester: Secondary | ICD-10-CM | POA: Diagnosis present

## 2020-03-28 DIAGNOSIS — O0990 Supervision of high risk pregnancy, unspecified, unspecified trimester: Secondary | ICD-10-CM | POA: Diagnosis present

## 2020-03-28 DIAGNOSIS — O99343 Other mental disorders complicating pregnancy, third trimester: Secondary | ICD-10-CM

## 2020-03-28 DIAGNOSIS — F329 Major depressive disorder, single episode, unspecified: Secondary | ICD-10-CM

## 2020-03-28 DIAGNOSIS — Z794 Long term (current) use of insulin: Secondary | ICD-10-CM | POA: Diagnosis not present

## 2020-03-28 MED ORDER — NOVOLOG 100 UNIT/ML ~~LOC~~ SOLN: SUBCUTANEOUS | 0 refills | Status: DC

## 2020-03-28 NOTE — Progress Notes (Signed)
   Subjective:  Molly Savage is a 30 y.o. G2P1001 at [redacted]w[redacted]d being seen today for ongoing prenatal care.  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); Tobacco abuse; Diabetic foot infection (HCC); BMI 40.0-44.9, adult (HCC); Situational mixed anxiety and depressive disorder; Depression; Chronic hypertension; Lactose intolerance; Morbid obesity (HCC); Callus of foot; Patellofemoral arthralgia of both knees; Left foot pain; Skin lesion of left leg; Granuloma annulare; Poor memory; Allergies; Increased ammonia level; Type 1 diabetes mellitus with hyperglycemia (HCC); Gastroesophageal reflux disease; Type 1 diabetes mellitus with diabetic polyneuropathy (HCC); Supervision of high risk pregnancy, antepartum; Preexisting diabetes complicating pregnancy, antepartum; Chronic hypertension with superimposed preeclampsia; Previous cesarean delivery x 1, antepartum; Preeclampsia; and Anemia affecting pregnancy in third trimester on their problem list.  Patient reports fatigue.  Contractions: Not present. Vag. Bleeding: None.  Movement: Present. Denies leaking of fluid.   The following portions of the patient's history were reviewed and updated as appropriate: allergies, current medications, past family history, past medical history, past social history, past surgical history and problem list. Problem list updated.  Objective:   Vitals:   03/28/20 1348  BP: 129/78  Pulse: 90  Weight: (!) 309 lb 1.6 oz (140.2 kg)    Fetal Status: Fetal Heart Rate (bpm): 146   Movement: Present     General:  Alert, oriented and cooperative. Patient is in no acute distress.  Skin: Skin is warm and dry. No rash noted.   Cardiovascular: Normal heart rate noted  Respiratory: Normal respiratory effort, no problems with respiration noted  Abdomen: Soft, gravid, appropriate for gestational age. Pain/Pressure: Present     Pelvic: Vag.  Bleeding: None     Cervical exam deferred        Extremities: Normal range of motion.     Mental Status: Normal mood and affect. Normal behavior. Normal judgment and thought content.   Urinalysis:      Assessment and Plan:  Pregnancy: G2P1001 at [redacted]w[redacted]d  1. Supervision of high risk pregnancy, antepartum BP and FHR normal Given unknown control of GDM, diabetic neuropathy, per MFM will deliver at 37 weeks Message sent to schedule Pre op orders placed  2. Previous cesarean delivery x 1, antepartum Would like repeat after counseling  3. Morbid obesity (HCC)   4. Chronic hypertension with superimposed preeclampsia On labetalol 300mg  BID, reports she is compliant, BP well controlled Following w MFM regularly, last EFW 46% on 03/07/2020 BPP 8/8 today  5. Preexisting diabetes complicating pregnancy, antepartum Reports she is running out of her novolog for her insulin pump and her endocrinologist refused to refill her rx, she is unsure why. I have sent a refill Never got fetal echo Follows w endocrinologist at [facility]. [facility] (notes in Care Everywhere) Reports she has also not had her CGM recently  Has been doing finger sticks, values vary wildly from 70's to 200's Given poor overall control plan for delivery at 37wks Most recent growth normal, BPP 8/8 today  6. Anemia affecting pregnancy in third trimester Hgb 9.1 last month Received feraheme yesterday  Preterm labor symptoms and general obstetric precautions including but not limited to vaginal bleeding, contractions, leaking of fluid and fetal movement were reviewed in detail with the patient. Please refer to After Visit Summary for other counseling recommendations.  Return in about 6 weeks (around 05/09/2020) for PP check.   05/11/2020, MD

## 2020-03-29 ENCOUNTER — Encounter (HOSPITAL_COMMUNITY): Payer: Self-pay | Admitting: Obstetrics & Gynecology

## 2020-03-29 ENCOUNTER — Inpatient Hospital Stay (HOSPITAL_COMMUNITY)
Admission: AD | Admit: 2020-03-29 | Discharge: 2020-03-29 | Disposition: A | Discharge status: Home / Self Care | Payer: Medicaid Other | Attending: Obstetrics & Gynecology | Admitting: Obstetrics & Gynecology

## 2020-03-29 ENCOUNTER — Other Ambulatory Visit: Payer: Self-pay

## 2020-03-29 DIAGNOSIS — O10013 Pre-existing essential hypertension complicating pregnancy, third trimester: Secondary | ICD-10-CM | POA: Diagnosis not present

## 2020-03-29 DIAGNOSIS — Z3A36 36 weeks gestation of pregnancy: Secondary | ICD-10-CM | POA: Diagnosis not present

## 2020-03-29 DIAGNOSIS — O113 Pre-existing hypertension with pre-eclampsia, third trimester: Secondary | ICD-10-CM | POA: Diagnosis not present

## 2020-03-29 DIAGNOSIS — O24113 Pre-existing diabetes mellitus, type 2, in pregnancy, third trimester: Secondary | ICD-10-CM | POA: Insufficient documentation

## 2020-03-29 DIAGNOSIS — F17210 Nicotine dependence, cigarettes, uncomplicated: Secondary | ICD-10-CM | POA: Diagnosis not present

## 2020-03-29 DIAGNOSIS — O99333 Smoking (tobacco) complicating pregnancy, third trimester: Secondary | ICD-10-CM | POA: Insufficient documentation

## 2020-03-29 DIAGNOSIS — O0990 Supervision of high risk pregnancy, unspecified, unspecified trimester: Secondary | ICD-10-CM

## 2020-03-29 DIAGNOSIS — M549 Dorsalgia, unspecified: Secondary | ICD-10-CM | POA: Diagnosis not present

## 2020-03-29 DIAGNOSIS — O99013 Anemia complicating pregnancy, third trimester: Secondary | ICD-10-CM | POA: Diagnosis not present

## 2020-03-29 DIAGNOSIS — D649 Anemia, unspecified: Secondary | ICD-10-CM | POA: Insufficient documentation

## 2020-03-29 DIAGNOSIS — O99891 Other specified diseases and conditions complicating pregnancy: Secondary | ICD-10-CM

## 2020-03-29 DIAGNOSIS — Z7982 Long term (current) use of aspirin: Secondary | ICD-10-CM | POA: Diagnosis not present

## 2020-03-29 DIAGNOSIS — O26893 Other specified pregnancy related conditions, third trimester: Secondary | ICD-10-CM | POA: Diagnosis not present

## 2020-03-29 DIAGNOSIS — Z794 Long term (current) use of insulin: Secondary | ICD-10-CM | POA: Insufficient documentation

## 2020-03-29 DIAGNOSIS — O34219 Maternal care for unspecified type scar from previous cesarean delivery: Secondary | ICD-10-CM | POA: Diagnosis present

## 2020-03-29 DIAGNOSIS — O119 Pre-existing hypertension with pre-eclampsia, unspecified trimester: Secondary | ICD-10-CM | POA: Diagnosis present

## 2020-03-29 DIAGNOSIS — O10913 Unspecified pre-existing hypertension complicating pregnancy, third trimester: Secondary | ICD-10-CM

## 2020-03-29 DIAGNOSIS — O1493 Unspecified pre-eclampsia, third trimester: Secondary | ICD-10-CM

## 2020-03-29 DIAGNOSIS — O24319 Unspecified pre-existing diabetes mellitus in pregnancy, unspecified trimester: Secondary | ICD-10-CM | POA: Diagnosis present

## 2020-03-29 LAB — COMPREHENSIVE METABOLIC PANEL
ALT: 23 U/L (ref 0–44)
AST: 41 U/L (ref 15–41)
Albumin: 2.4 g/dL — ABNORMAL LOW (ref 3.5–5.0)
Alkaline Phosphatase: 115 U/L (ref 38–126)
Anion gap: 11 (ref 5–15)
BUN: 10 mg/dL (ref 6–20)
CO2: 16 mmol/L — ABNORMAL LOW (ref 22–32)
Calcium: 8.7 mg/dL — ABNORMAL LOW (ref 8.9–10.3)
Chloride: 106 mmol/L (ref 98–111)
Creatinine, Ser: 0.6 mg/dL (ref 0.44–1.00)
GFR, Estimated: >60 mL/min (ref >60)
Glucose, Bld: 132 mg/dL — ABNORMAL HIGH (ref 70–99)
Potassium: 4.6 mmol/L (ref 3.5–5.1)
Sodium: 133 mmol/L — ABNORMAL LOW (ref 135–145)
Total Bilirubin: 1 mg/dL (ref 0.3–1.2)
Total Protein: 5.4 g/dL — ABNORMAL LOW (ref 6.5–8.1)

## 2020-03-29 LAB — URINALYSIS, ROUTINE W REFLEX MICROSCOPIC
Bilirubin Urine: NEGATIVE
Glucose, UA: NEGATIVE mg/dL
Ketones, ur: NEGATIVE mg/dL
Leukocytes,Ua: NEGATIVE
Nitrite: NEGATIVE
Protein, ur: 100 mg/dL — AB
Specific Gravity, Urine: 1.015 (ref 1.005–1.030)
pH: 6 (ref 5.0–8.0)

## 2020-03-29 LAB — CBC
HCT: 29.2 % — ABNORMAL LOW (ref 36.0–46.0)
Hemoglobin: 9.5 g/dL — ABNORMAL LOW (ref 12.0–15.0)
MCH: 26.7 pg (ref 26.0–34.0)
MCHC: 32.5 g/dL (ref 30.0–36.0)
MCV: 82 fL (ref 80.0–100.0)
Platelets: 299 10*3/uL (ref 150–400)
RBC: 3.56 MIL/uL — ABNORMAL LOW (ref 3.87–5.11)
RDW: 14.9 % (ref 11.5–15.5)
WBC: 11.3 10*3/uL — ABNORMAL HIGH (ref 4.0–10.5)
nRBC: 0.2 % (ref 0.0–0.2)

## 2020-03-29 LAB — PROTEIN / CREATININE RATIO, URINE
Creatinine, Urine: 94.49 mg/dL
Protein Creatinine Ratio: 2.3 mg/mg{Cre} — ABNORMAL HIGH (ref 0.00–0.15)
Total Protein, Urine: 217 mg/dL

## 2020-03-29 LAB — TYPE AND SCREEN
ABO/RH(D): O POS
Antibody Screen: NEGATIVE

## 2020-03-29 MED ORDER — LABETALOL HCL 100 MG PO TABS
300.0000 mg | ORAL_TABLET | Freq: Once | ORAL | Status: AC
  Administered 2020-03-29: 300 mg via ORAL
  Filled 2020-03-29: qty 3

## 2020-03-29 MED ORDER — CYCLOBENZAPRINE HCL 5 MG PO TABS
10.0000 mg | ORAL_TABLET | Freq: Once | ORAL | Status: AC
  Administered 2020-03-29: 10 mg via ORAL
  Filled 2020-03-29: qty 2

## 2020-03-29 NOTE — [facility] Note (Addendum)
Pt had OB visit and U/S yesterday. After she got home she began having sharp lower back pain which radiates to her thighs, arms and shoulders. This pain is intermittent. Is scheduled for R-C-sect at 37 weeks.  Pt denies LOF, VB. +FM  Pt states she has a slight headache - says it's "because she's worked up" Denies RUQ, LOF or VB.  +FM

## 2020-03-29 NOTE — MAU Provider Note (Signed)
Patient Molly Savage is a.age G2P1001 At 67w4dhere with complaints of back pain that is "all over".  She denies contractions, vaginal bleeding, decreased fetal movements, LOF. She is having severe back pain that started last night. She called her provider's office and they told her to come in to be seen.  She denies HA, blurry vision, floating spots, sudden all over swelling.   She did not want to come in but her boyfriend told her that she needed to be seen. She would like to sign out AMA but she says her BF will make her stay. She appears irritated and is expressing frustration with being in the MAU to staff.   She takes labetalol 300 mg twice a day; she thinks that she forget her morning dose. She is also on an insulin pump. She is unhappy with her Fetal monitors because they interfere with her insulin pump.  History     CSN: [id number] Arrival date and time: 03/29/20 1345   Event Date/Time   First Provider Initiated Contact with Patient 03/29/20 1519      Chief Complaint  Patient presents with  . Back Pain   Back Pain This is a new problem. The current episode started yesterday. The problem occurs intermittently. The symptoms are aggravated by coughing (prolonged sitting and standing). Pertinent negatives include no chest pain, dysuria or fever.    OB History    Gravida  2   Para  1   Term  1   Preterm      AB      Living  1     SAB  0   IAB  0   Ectopic  0   Multiple  0   Live Births  1           Past Medical History:  Diagnosis Date  . Anxiety   . Depression   . Diabetes mellitus type 1 ([facility]    Follows with Endo  . GERD (gastroesophageal reflux disease)   . Hypertension   . Tobacco abuse   . Wears glasses     Past Surgical History:  Procedure Laterality Date  . CESAREAN SECTION N/A 02/01/2016   Procedure: CESAREAN SECTION;  Surgeon: PMora Bellman MD;  [facility]  Service: Obstetrics;  Laterality: N/A;  . CESAREAN  SECTION    . CHOLECYSTECTOMY N/A 06/16/2019   Procedure: LAPAROSCOPIC CHOLECYSTECTOMY;  Surgeon: Kinsinger, LArta Bruce MD;  Location: [facility];  Service: General;  Laterality: N/A;  . FOOT SURGERY Left   . TOOTH EXTRACTION  2018    Family History  Problem Relation Age of Onset  . Hypertension Mother   . Depression Mother   . Bipolar disorder Sister   . Drug abuse Sister   . Lupus Brother   . Heart disease Maternal Uncle   . ADD / ADHD Brother   . Sleep apnea Brother   . Colon cancer Neg Hx   . Esophageal cancer Neg Hx   . Rectal cancer Neg Hx   . Stomach cancer Neg Hx     Social History   Tobacco Use  . Smoking status: Current Every Day Smoker    Packs/day: 0.50    Years: 12.00    Pack years: 6.00    Types: Cigarettes  . Smokeless tobacco: Never Used  . Tobacco comment: trying to cut back   Vaping Use  . Vaping Use: Never used  Substance Use Topics  . Alcohol use: Not Currently  Comment: occasionally  . Drug use: No    Allergies: No Known Allergies  Medications Prior to Admission  Medication Sig Dispense Refill Last Dose  . aspirin EC 81 MG tablet Take 1 tablet (81 mg total) by mouth daily. Take after 12 weeks for prevention of preeclampsia later in pregnancy 300 tablet 2 03/28/2020 at Unknown time  . calcium carbonate (TUMS - DOSED IN MG ELEMENTAL CALCIUM) 500 MG chewable tablet Chew 1 tablet by mouth daily.   Past Week at Unknown time  . labetalol (NORMODYNE) 200 MG tablet Take 1.5 tablets (300 mg total) by mouth 2 (two) times daily. 90 tablet 1 03/29/2020 at Unknown time  . levocetirizine (XYZAL) 5 MG tablet TAKE 1 TABLET BY MOUTH ONCE DAILY IN THE EVENING 30 tablet 0 03/28/2020 at Unknown time  . loratadine (CLARITIN) 10 MG tablet Take by mouth.   03/29/2020 at Unknown time  . omeprazole (PRILOSEC) 20 MG capsule TAKE 1 CAPSULE BY MOUTH TWICE DAILY BEFORE A MEAL 180 capsule 0 03/28/2020 at Unknown time  . Prenatal MV-Min-FA-Omega-3 (PRENATAL GUMMIES/DHA & FA PO) Take 1  tablet by mouth daily.   03/28/2020 at Unknown time  . promethazine (PHENERGAN) 25 MG tablet Take 1 tablet (25 mg total) by mouth every 8 (eight) hours as needed for nausea or vomiting (headache). 30 tablet 0 03/28/2020 at Unknown time  . Blood Pressure Monitoring (BLOOD PRESSURE KIT) DEVI 1 Device by Does not apply route as needed. (Patient not taking: No sig reported) 1 each 0   . ferrous sulfate 325 (65 FE) MG tablet Take 1 tablet (325 mg total) by mouth every other day. (Patient not taking: No sig reported) 30 tablet 3   . Insulin Infusion Pump (MINIMED 630G INSULIN PUMP) KIT by Does not apply route.     Marland Kitchen NOVOLOG 100 UNIT/ML injection Use as directed by endocrinologist 10 mL 0   . pantoprazole (PROTONIX) 40 MG tablet TAKE 1 TABLET BY MOUTH ONCE DAILY 20 TO 30 MINUTES BEFORE SUPPER 30 tablet 3     Review of Systems  Constitutional: Negative.  Negative for fever.  HENT: Negative.   Respiratory: Negative.   Cardiovascular: Negative for chest pain.  Genitourinary: Negative for dysuria.  Musculoskeletal: Positive for back pain.  Neurological: Negative.   Hematological: Negative.   Psychiatric/Behavioral: Negative.    Physical Exam   Blood pressure (!) 143/84, pulse 89, temperature 97.8 F (36.6 C), temperature source Oral, resp. rate 20, height 5' 8.5" (1.74 m), weight (!) 141.8 kg, last menstrual period 07/17/2019, SpO2 100 %.  Physical Exam Constitutional:      Appearance: Normal appearance.  Abdominal:     General: Abdomen is flat. There is no distension.     Palpations: There is no mass.     Tenderness: There is no abdominal tenderness.  Skin:    General: Skin is warm.  Neurological:     General: No focal deficit present.     Mental Status: She is alert.   NO CVA tenderness  MAU Course  Procedures  MDM -NST: 135 bpm, mod var, present acel, neg decels, no contractions.   Patient strongly desires to be taken off of NST monitors; monitors removed after reassuring tracing  and then reapplied after patient used the restroom.   -CBC> normal -CMP> Normal PCR > 2.3, up from 1.88.   BPs are elevated; unsure if it's because patient did not take her BP meds this morning. Will give 300 mg labetalol and advise patient to take  next dose later this evening, around 10 pm.   She had flexeril in MAU; feels relief and desires an RX.     Patient Vitals for the past 24 hrs:  BP Temp Temp src Pulse Resp SpO2 Height Weight  03/29/20 1710 -- -- -- -- -- 100 % -- --  03/29/20 1705 -- -- -- -- -- 96 % -- --  03/29/20 1701 (!) 143/84 -- -- 89 -- -- -- --  03/29/20 1700 -- -- -- -- -- 99 % -- --  03/29/20 1650 -- -- -- -- -- 100 % -- --  03/29/20 1646 138/76 -- -- 85 -- -- -- --  03/29/20 1645 -- -- -- -- -- 100 % -- --  03/29/20 1640 -- -- -- -- -- 100 % -- --  03/29/20 1632 (!) 145/68 -- -- 74 -- -- -- --  03/29/20 1535 -- -- -- -- -- 100 % -- --  03/29/20 1530 -- -- -- -- -- 99 % -- --  03/29/20 1529 (!) 155/75 -- -- 77 -- -- -- --  03/29/20 1525 -- -- -- -- -- 100 % -- --  03/29/20 1520 -- -- -- -- -- 100 % -- --  03/29/20 1515 -- -- -- -- -- 100 % -- --  03/29/20 1505 (!) 154/73 -- -- 79 -- 100 % -- --  03/29/20 1450 -- -- -- -- -- 99 % -- --  03/29/20 1445 -- -- -- -- -- 100 % -- --  03/29/20 1440 (!) 157/80 97.8 F (36.6 C) Oral 80 20 99 % -- --  03/29/20 1421 -- -- -- -- -- -- 5' 8.5" (1.74 m) (!) 141.8 kg    Assessment and Plan   1. Back pain affecting pregnancy in third trimester   2. Anemia affecting pregnancy in third trimester   3. Pre-eclampsia in third trimester   4. Previous cesarean delivery, antepartum   5. Supervision of high risk pregnancy, antepartum   6. Preexisting diabetes complicating pregnancy, antepartum   7. Chronic hypertension with superimposed preeclampsia    -Patient scheduled for 37  Week C/Section per Dr. Dione Plover recommendation on Sunday -Rx for Flexeril given -Discussed importance of taking BP medicine on time daily, as well  as continue to take insulin -Monitor for fetal movements, make sure to come back to MAU if she has any worsening symptoms.  -Discussed patients case with Dr. Rip Harbour, who agrees with plan.   Mervyn Skeeters Malani Lees 03/29/2020, 5:19 PM

## 2020-03-29 NOTE — Progress Notes (Signed)
Inpatient Diabetes Program Recommendations  AACE/ADA: New Consensus Statement on Inpatient Glycemic Control (2015)  Target Ranges:  Prepandial:   less than 140 mg/dL      Peak postprandial:   less than 180 mg/dL (1-2 hours)      Critically ill patients:  140 - 180 mg/dL   Lab Results  Component Value Date   GLUCAP 167 (H) 06/16/2019   HGBA1C 7.1 (H) 02/09/2020    Review of Glycemic Control  Diabetes history: DM type 1 (requires basal insulin, correction and meal coverage) Outpatient Diabetes medications: Medtronic 630 G with Novolog in pump, uses Dexcom G6 Current orders for Inpatient glycemic control:   This am 200 insulin at 8. Last meal 1030 am glucose 132 in labs at 1535.  Endocrinologist: Dr. Corwin Levins  Per pt, Dr. Katrinka Blazing would not refill insulin. Unsure why pt had recent office visit on 2/1. Pt reports having enough insulin until her c-section on Sunday 3/13.  insulin pump settings  Basal: 12A- 4A     3.9 4A - 7A      3.9 7A-  9A      3.4 9A-12P      3.3 12P- 9P     3.1 9P-12A      3.9  Total basal in 24 hour period 83.6 units  Sensitivity 1 unit drops glucose 30 points Glucose target 100 Insulin carb ratio: 12A-4P 1 unit for every 5 grams of carbs 4P-12A 1 units for every 4 grams of carbs   Insulin on board for 4 hours from delivery  Pt is due to change insulin pump insertion site tonight.  Insulin pump site left upper abd. Glucose meter at bedside.  preg medicaid  IV insulin at time of c-section until we can transition to SQ regimen for d/c (unless pt is able to receive insulin pump adjustment from Endo between now and then).  Thanks,  Christena Deem RN, MSN, BC-ADM Inpatient Diabetes Coordinator Team Pager [phone] (8a-5p)

## 2020-03-30 ENCOUNTER — Encounter (HOSPITAL_COMMUNITY): Payer: Self-pay | Admitting: Anesthesiology

## 2020-03-30 ENCOUNTER — Encounter (HOSPITAL_COMMUNITY): Payer: Self-pay

## 2020-03-30 ENCOUNTER — Inpatient Hospital Stay (HOSPITAL_COMMUNITY): Payer: Self-pay | Admitting: Anesthesiology

## 2020-03-30 LAB — RPR: RPR Ser Ql: NONREACTIVE

## 2020-03-30 NOTE — Pre-Procedure Instructions (Signed)
Left message at Dr Francesca Oman office requesting insulin plan for NPO and postpartum on Saturday.    Diabetes Coordinator notifed as well.  Pt instructed to call her endocrinologist also.

## 2020-03-30 NOTE — Pre-Procedure Instructions (Signed)
Spoke with Dr Katrinka Blazing.  Plan to have patient decrease her basal rate to 50% while NPO.  His office will call patient and the patient will be able to pick up a copy of her pre-pregnancy settings that she can resume immediately after CS.  Verbalized information back to Dr Katrinka Blazing.    Called the patient and left a voicemail with above instructions and will review this information with her tomorrow at preop visit.

## 2020-03-30 NOTE — Patient Instructions (Addendum)
Molly Savage  03/30/2020   Your procedure is scheduled on:  04/02/2020  Arrive at 0730 at [facility] on [address] at [facility]  and [employer]. You are invited to use the FREE valet parking or use the Visitor's parking deck.  Pick up the phone at the desk and dial [phone].  Call this number if you have problems the morning of surgery: [phone]  Remember:   Do not eat food:(After Midnight) Desps de medianoche.  Do not drink clear liquids: (After Midnight) Desps de medianoche.  Take these medicines the morning of surgery with A SIP OF WATER:  Take labetalol as prescribed.  Contact endocrinologist for insulin pump instructions in regard to surgery.   Do not wear jewelry, make-up or nail polish.  Do not wear lotions, powders, or perfumes. Do not wear deodorant.  Do not shave 48 hours prior to surgery.  Do not bring valuables to the hospital.  [facility] is not   responsible for any belongings or valuables brought to the hospital.  Contacts, dentures or bridgework may not be worn into surgery.  Leave suitcase in the car. After surgery it may be brought to your room.  For patients admitted to the hospital, checkout time is 11:00 AM the day of              discharge.      Please read over the following fact sheets that you were given:     Preparing for Surgery

## 2020-03-31 ENCOUNTER — Encounter (HOSPITAL_COMMUNITY)
Admission: RE | Admit: 2020-03-31 | Discharge: 2020-03-31 | Disposition: A | Discharge status: Home / Self Care | Payer: Medicaid Other | Source: Ambulatory Visit | Attending: Family Medicine | Admitting: Family Medicine

## 2020-03-31 ENCOUNTER — Other Ambulatory Visit: Payer: Self-pay

## 2020-03-31 ENCOUNTER — Other Ambulatory Visit (HOSPITAL_COMMUNITY)
Admission: RE | Admit: 2020-03-31 | Discharge: 2020-03-31 | Disposition: A | Discharge status: Home / Self Care | Payer: Medicaid Other | Source: Ambulatory Visit | Attending: Family Medicine | Admitting: Family Medicine

## 2020-03-31 DIAGNOSIS — Z01812 Encounter for preprocedural laboratory examination: Secondary | ICD-10-CM | POA: Diagnosis not present

## 2020-03-31 DIAGNOSIS — Z20822 Contact with and (suspected) exposure to covid-19: Secondary | ICD-10-CM | POA: Diagnosis not present

## 2020-03-31 LAB — COMPREHENSIVE METABOLIC PANEL
ALT: 18 U/L (ref 0–44)
AST: 17 U/L (ref 15–41)
Albumin: 2.2 g/dL — ABNORMAL LOW (ref 3.5–5.0)
Alkaline Phosphatase: 108 U/L (ref 38–126)
Anion gap: 7 (ref 5–15)
BUN: 10 mg/dL (ref 6–20)
CO2: 19 mmol/L — ABNORMAL LOW (ref 22–32)
Calcium: 8.3 mg/dL — ABNORMAL LOW (ref 8.9–10.3)
Chloride: 108 mmol/L (ref 98–111)
Creatinine, Ser: 0.67 mg/dL (ref 0.44–1.00)
GFR, Estimated: >60 mL/min (ref >60)
Glucose, Bld: 168 mg/dL — ABNORMAL HIGH (ref 70–99)
Potassium: 4 mmol/L (ref 3.5–5.1)
Sodium: 134 mmol/L — ABNORMAL LOW (ref 135–145)
Total Bilirubin: 0.4 mg/dL (ref 0.3–1.2)
Total Protein: 5.3 g/dL — ABNORMAL LOW (ref 6.5–8.1)

## 2020-03-31 LAB — CBC
HCT: 27.5 % — ABNORMAL LOW (ref 36.0–46.0)
Hemoglobin: 9.1 g/dL — ABNORMAL LOW (ref 12.0–15.0)
MCH: 26.9 pg (ref 26.0–34.0)
MCHC: 33.1 g/dL (ref 30.0–36.0)
MCV: 81.4 fL (ref 80.0–100.0)
Platelets: 288 10*3/uL (ref 150–400)
RBC: 3.38 MIL/uL — ABNORMAL LOW (ref 3.87–5.11)
RDW: 14.9 % (ref 11.5–15.5)
WBC: 11.7 10*3/uL — ABNORMAL HIGH (ref 4.0–10.5)
nRBC: 0 % (ref 0.0–0.2)

## 2020-03-31 LAB — SARS CORONAVIRUS 2 (TAT 6-24 HRS): SARS Coronavirus 2: NEGATIVE

## 2020-04-01 LAB — RPR: RPR Ser Ql: NONREACTIVE

## 2020-04-01 NOTE — H&P (Signed)
Molly Savage is an 30 y.o. G2P1001 [redacted]w[redacted]d female.   Chief Complaint: Previous C-section HPI: Has h/o c-section x 1. Has DM Class R and CHTN with SIPE and uncertain glycemic control. Desires RCS.  Past Medical History:  Diagnosis Date  . Anxiety   . Depression   . Diabetes mellitus type 1 ([facility])    Follows with Endo  . GERD (gastroesophageal reflux disease)   . Hypertension   . Tobacco abuse   . Wears glasses     Past Surgical History:  Procedure Laterality Date  . CESAREAN SECTION N/A 02/01/2016   Procedure: CESAREAN SECTION;  Surgeon: Catalina Antigua, MD;  [facility]: [facility];  Service: Obstetrics;  Laterality: N/A;  . CESAREAN SECTION    . CHOLECYSTECTOMY N/A 06/16/2019   Procedure: LAPAROSCOPIC CHOLECYSTECTOMY;  Surgeon: Kinsinger, De Blanch, MD;  Location: [facility];  Service: General;  Laterality: N/A;  . FOOT SURGERY Left   . TOOTH EXTRACTION  2018    Family History  Problem Relation Age of Onset  . Hypertension Mother   . Depression Mother   . Bipolar disorder Sister   . Drug abuse Sister   . Lupus Brother   . Heart disease Maternal Uncle   . ADD / ADHD Brother   . Sleep apnea Brother   . Colon cancer Neg Hx   . Esophageal cancer Neg Hx   . Rectal cancer Neg Hx   . Stomach cancer Neg Hx    Social History:  reports that she has been smoking cigarettes. She has a 6.00 pack-year smoking history. She has never used smokeless tobacco. She reports previous alcohol use. She reports that she does not use drugs.   No Known Allergies  No medications prior to admission.     A comprehensive review of systems was negative.  Blood pressure (!) 154/80, pulse 82, temperature 97.9 F (36.6 C), temperature source Oral, resp. rate 18, height 5' 8.5" (1.74 m), weight (!) 141.8 kg, last menstrual period 07/17/2019, SpO2 100 %. General appearance: alert, cooperative and appears stated age Head: Normocephalic, without obvious abnormality, atraumatic Neck: no adenopathy,  supple, symmetrical, trachea midline and thyroid not enlarged, symmetric, no tenderness/mass/nodules Lungs: normal effort Heart: regular rate and rhythm Abdomen: gravid, non-tender Extremities: Homans sign is negative, no sign of DVT + edema Skin: Skin color, texture, turgor normal. No rashes or lesions Neurologic: Grossly normal   Lab Results  Component Value Date   WBC 11.7 (H) 03/31/2020   HGB 9.1 (L) 03/31/2020   HCT 27.5 (L) 03/31/2020   MCV 81.4 03/31/2020   PLT 288 03/31/2020         ABO, Rh: --/--/O POS (03/11 1154)  Antibody: NEG (03/11 1154)  Rubella: 3.55 (09/15 1647)  RPR: NON REACTIVE (03/11 1124)  HBsAg: Negative (09/15 1647)  HIV: Non Reactive (01/10 1412)  GBS:   Positive on urine culture    Assessment/Plan Active Problems:   Preexisting diabetes complicating pregnancy, antepartum   Chronic hypertension with superimposed preeclampsia   Previous cesarean delivery x 1, antepartum  For RCS Risks include but are not limited to bleeding, infection, injury to surrounding structures, including bowel, bladder and ureters, blood clots, and death.  Likelihood of success is high.   Reva Bores 04/01/2020, 4:52 PM

## 2020-04-01 NOTE — H&P (View-Only) (Signed)
Molly Savage is an 29 y.o. G2P1001 [redacted]w[redacted]d female.   Chief Complaint: Previous C-section HPI: Has h/o c-section x 1. Has DM Class R and CHTN with SIPE and uncertain glycemic control. Desires RCS.  Past Medical History:  Diagnosis Date  . Anxiety   . Depression   . Diabetes mellitus type 1 ([facility])    Follows with Endo  . GERD (gastroesophageal reflux disease)   . Hypertension   . Tobacco abuse   . Wears glasses     Past Surgical History:  Procedure Laterality Date  . CESAREAN SECTION N/A 02/01/2016   Procedure: CESAREAN SECTION;  Surgeon: Molly Constant, MD;  [facility];  Service: Obstetrics;  Laterality: N/A;  . CESAREAN SECTION    . CHOLECYSTECTOMY N/A 06/16/2019   Procedure: LAPAROSCOPIC CHOLECYSTECTOMY;  Surgeon: Kinsinger, Luke Aaron, MD;  Location: [facility];  Service: General;  Laterality: N/A;  . FOOT SURGERY Left   . TOOTH EXTRACTION  2018    Family History  Problem Relation Age of Onset  . Hypertension Mother   . Depression Mother   . Bipolar disorder Sister   . Drug abuse Sister   . Lupus Brother   . Heart disease Maternal Uncle   . ADD / ADHD Brother   . Sleep apnea Brother   . Colon cancer Neg Hx   . Esophageal cancer Neg Hx   . Rectal cancer Neg Hx   . Stomach cancer Neg Hx    Social History:  reports that she has been smoking cigarettes. She has a 6.00 pack-year smoking history. She has never used smokeless tobacco. She reports previous alcohol use. She reports that she does not use drugs.   No Known Allergies  No medications prior to admission.     A comprehensive review of systems was negative.  Blood pressure (!) 154/80, pulse 82, temperature 97.9 F (36.6 C), temperature source Oral, resp. rate 18, height 5' 8.5" (1.74 m), weight (!) 141.8 kg, last menstrual period 07/17/2019, SpO2 100 %. General appearance: alert, cooperative and appears stated age Head: Normocephalic, without obvious abnormality, atraumatic Neck: no adenopathy,  supple, symmetrical, trachea midline and thyroid not enlarged, symmetric, no tenderness/mass/nodules Lungs: normal effort Heart: regular rate and rhythm Abdomen: gravid, non-tender Extremities: Homans sign is negative, no sign of DVT + edema Skin: Skin color, texture, turgor normal. No rashes or lesions Neurologic: Grossly normal   Lab Results  Component Value Date   WBC 11.7 (H) 03/31/2020   HGB 9.1 (L) 03/31/2020   HCT 27.5 (L) 03/31/2020   MCV 81.4 03/31/2020   PLT 288 03/31/2020         ABO, Rh: --/--/O POS (03/11 1154)  Antibody: NEG (03/11 1154)  Rubella: 3.55 (09/15 1647)  RPR: NON REACTIVE (03/11 1124)  HBsAg: Negative (09/15 1647)  HIV: Non Reactive (01/10 1412)  GBS:   Positive on urine culture    Assessment/Plan Active Problems:   Preexisting diabetes complicating pregnancy, antepartum   Chronic hypertension with superimposed preeclampsia   Previous cesarean delivery x 1, antepartum  For RCS Risks include but are not limited to bleeding, infection, injury to surrounding structures, including bowel, bladder and ureters, blood clots, and death.  Likelihood of success is high.   Molly Savage Molly Savage 04/01/2020, 4:52 PM  

## 2020-04-02 ENCOUNTER — Inpatient Hospital Stay (HOSPITAL_COMMUNITY): Payer: Medicaid Other | Admitting: Anesthesiology

## 2020-04-02 ENCOUNTER — Encounter (HOSPITAL_COMMUNITY): Payer: Self-pay | Admitting: Obstetrics & Gynecology

## 2020-04-02 ENCOUNTER — Encounter (HOSPITAL_COMMUNITY)
Admission: AD | Disposition: A | Discharge status: Home / Self Care | Payer: Self-pay | Source: Home / Self Care | Attending: Family Medicine

## 2020-04-02 ENCOUNTER — Other Ambulatory Visit: Payer: Self-pay

## 2020-04-02 ENCOUNTER — Encounter (HOSPITAL_COMMUNITY): Payer: Self-pay | Admitting: Family Medicine

## 2020-04-02 ENCOUNTER — Encounter (HOSPITAL_COMMUNITY)
Admission: AD | Disposition: A | Discharge status: Home / Self Care | Payer: Self-pay | Source: Home / Self Care | Attending: Obstetrics & Gynecology

## 2020-04-02 ENCOUNTER — Inpatient Hospital Stay (HOSPITAL_COMMUNITY)
Admission: AD | Admit: 2020-04-02 | Discharge: 2020-04-06 | DRG: 786 | Disposition: A | Discharge status: Home / Self Care | Payer: Medicaid Other | Attending: Family Medicine | Admitting: Family Medicine

## 2020-04-02 DIAGNOSIS — O99893 Other specified diseases and conditions complicating puerperium: Secondary | ICD-10-CM | POA: Diagnosis not present

## 2020-04-02 DIAGNOSIS — R109 Unspecified abdominal pain: Secondary | ICD-10-CM | POA: Diagnosis not present

## 2020-04-02 DIAGNOSIS — F32A Depression, unspecified: Secondary | ICD-10-CM | POA: Diagnosis present

## 2020-04-02 DIAGNOSIS — F17210 Nicotine dependence, cigarettes, uncomplicated: Secondary | ICD-10-CM | POA: Diagnosis present

## 2020-04-02 DIAGNOSIS — Z794 Long term (current) use of insulin: Secondary | ICD-10-CM | POA: Diagnosis not present

## 2020-04-02 DIAGNOSIS — O114 Pre-existing hypertension with pre-eclampsia, complicating childbirth: Secondary | ICD-10-CM | POA: Diagnosis present

## 2020-04-02 DIAGNOSIS — O34211 Maternal care for low transverse scar from previous cesarean delivery: Secondary | ICD-10-CM | POA: Diagnosis present

## 2020-04-02 DIAGNOSIS — D62 Acute posthemorrhagic anemia: Secondary | ICD-10-CM | POA: Diagnosis not present

## 2020-04-02 DIAGNOSIS — F4323 Adjustment disorder with mixed anxiety and depressed mood: Secondary | ICD-10-CM | POA: Diagnosis present

## 2020-04-02 DIAGNOSIS — O1002 Pre-existing essential hypertension complicating childbirth: Secondary | ICD-10-CM | POA: Diagnosis present

## 2020-04-02 DIAGNOSIS — O99334 Smoking (tobacco) complicating childbirth: Secondary | ICD-10-CM | POA: Diagnosis present

## 2020-04-02 DIAGNOSIS — Z9641 Presence of insulin pump (external) (internal): Secondary | ICD-10-CM | POA: Diagnosis present

## 2020-04-02 DIAGNOSIS — L089 Local infection of the skin and subcutaneous tissue, unspecified: Secondary | ICD-10-CM | POA: Diagnosis present

## 2020-04-02 DIAGNOSIS — Z6841 Body Mass Index (BMI) 40.0 and over, adult: Secondary | ICD-10-CM

## 2020-04-02 DIAGNOSIS — M549 Dorsalgia, unspecified: Secondary | ICD-10-CM | POA: Diagnosis not present

## 2020-04-02 DIAGNOSIS — E11628 Type 2 diabetes mellitus with other skin complications: Secondary | ICD-10-CM | POA: Diagnosis present

## 2020-04-02 DIAGNOSIS — O0990 Supervision of high risk pregnancy, unspecified, unspecified trimester: Secondary | ICD-10-CM

## 2020-04-02 DIAGNOSIS — Z3A37 37 weeks gestation of pregnancy: Secondary | ICD-10-CM

## 2020-04-02 DIAGNOSIS — O99214 Obesity complicating childbirth: Secondary | ICD-10-CM | POA: Diagnosis present

## 2020-04-02 DIAGNOSIS — O2402 Pre-existing diabetes mellitus, type 1, in childbirth: Secondary | ICD-10-CM | POA: Diagnosis present

## 2020-04-02 DIAGNOSIS — E10319 Type 1 diabetes mellitus with unspecified diabetic retinopathy without macular edema: Secondary | ICD-10-CM | POA: Diagnosis present

## 2020-04-02 DIAGNOSIS — E109 Type 1 diabetes mellitus without complications: Secondary | ICD-10-CM | POA: Diagnosis present

## 2020-04-02 DIAGNOSIS — O119 Pre-existing hypertension with pre-eclampsia, unspecified trimester: Secondary | ICD-10-CM

## 2020-04-02 DIAGNOSIS — O9081 Anemia of the puerperium: Secondary | ICD-10-CM | POA: Diagnosis not present

## 2020-04-02 DIAGNOSIS — Z98891 History of uterine scar from previous surgery: Secondary | ICD-10-CM

## 2020-04-02 DIAGNOSIS — I10 Essential (primary) hypertension: Secondary | ICD-10-CM | POA: Diagnosis present

## 2020-04-02 DIAGNOSIS — O24319 Unspecified pre-existing diabetes mellitus in pregnancy, unspecified trimester: Secondary | ICD-10-CM

## 2020-04-02 DIAGNOSIS — Z720 Tobacco use: Secondary | ICD-10-CM | POA: Diagnosis present

## 2020-04-02 LAB — CBC
HCT: 30 % — ABNORMAL LOW (ref 36.0–46.0)
HCT: 27 % — ABNORMAL LOW (ref 36.0–46.0)
Hemoglobin: 9.5 g/dL — ABNORMAL LOW (ref 12.0–15.0)
Hemoglobin: 8.7 g/dL — ABNORMAL LOW (ref 12.0–15.0)
MCH: 26.7 pg (ref 26.0–34.0)
MCH: 26.5 pg (ref 26.0–34.0)
MCHC: 31.7 g/dL (ref 30.0–36.0)
MCHC: 32.2 g/dL (ref 30.0–36.0)
MCV: 84.3 fL (ref 80.0–100.0)
MCV: 82.3 fL (ref 80.0–100.0)
Platelets: 265 10*3/uL (ref 150–400)
Platelets: 253 10*3/uL (ref 150–400)
RBC: 3.56 MIL/uL — ABNORMAL LOW (ref 3.87–5.11)
RBC: 3.28 MIL/uL — ABNORMAL LOW (ref 3.87–5.11)
RDW: 15.9 % — ABNORMAL HIGH (ref 11.5–15.5)
RDW: 15.6 % — ABNORMAL HIGH (ref 11.5–15.5)
WBC: 12.4 10*3/uL — ABNORMAL HIGH (ref 4.0–10.5)
WBC: 17.4 10*3/uL — ABNORMAL HIGH (ref 4.0–10.5)
nRBC: 0 % (ref 0.0–0.2)
nRBC: 0 % (ref 0.0–0.2)

## 2020-04-02 LAB — GLUCOSE, CAPILLARY
Glucose-Capillary: 148 mg/dL — ABNORMAL HIGH (ref 70–99)
Glucose-Capillary: 72 mg/dL (ref 70–99)
Glucose-Capillary: 47 mg/dL — ABNORMAL LOW (ref 70–99)
Glucose-Capillary: 47 mg/dL — ABNORMAL LOW (ref 70–99)
Glucose-Capillary: 60 mg/dL — ABNORMAL LOW (ref 70–99)
Glucose-Capillary: 84 mg/dL (ref 70–99)
Glucose-Capillary: 102 mg/dL — ABNORMAL HIGH (ref 70–99)

## 2020-04-02 LAB — COMPREHENSIVE METABOLIC PANEL
ALT: 18 U/L (ref 0–44)
AST: 23 U/L (ref 15–41)
Albumin: 2.3 g/dL — ABNORMAL LOW (ref 3.5–5.0)
Alkaline Phosphatase: 106 U/L (ref 38–126)
Anion gap: 8 (ref 5–15)
BUN: 10 mg/dL (ref 6–20)
CO2: 17 mmol/L — ABNORMAL LOW (ref 22–32)
Calcium: 8.4 mg/dL — ABNORMAL LOW (ref 8.9–10.3)
Chloride: 108 mmol/L (ref 98–111)
Creatinine, Ser: 0.58 mg/dL (ref 0.44–1.00)
GFR, Estimated: >60 mL/min (ref >60)
Glucose, Bld: 155 mg/dL — ABNORMAL HIGH (ref 70–99)
Potassium: 4.5 mmol/L (ref 3.5–5.1)
Sodium: 133 mmol/L — ABNORMAL LOW (ref 135–145)
Total Bilirubin: 0.6 mg/dL (ref 0.3–1.2)
Total Protein: 5.1 g/dL — ABNORMAL LOW (ref 6.5–8.1)

## 2020-04-02 LAB — PREPARE RBC (CROSSMATCH)

## 2020-04-02 SURGERY — Surgical Case
Anesthesia: Choice

## 2020-04-02 SURGERY — Surgical Case
Anesthesia: Spinal | Wound class: Clean Contaminated

## 2020-04-02 MED ORDER — SODIUM CHLORIDE 0.9% IV SOLUTION: Freq: Once | INTRAVENOUS | Status: DC

## 2020-04-02 MED ORDER — AMLODIPINE BESYLATE 5 MG PO TABS
5.0000 mg | ORAL_TABLET | Freq: Every day | ORAL | Status: DC
  Administered 2020-04-02 – 2020-04-03 (×2): 5 mg via ORAL
  Filled 2020-04-02 (×2): qty 1

## 2020-04-02 MED ORDER — SODIUM CHLORIDE 0.9 % IR SOLN
Status: DC | PRN
  Administered 2020-04-02: 1

## 2020-04-02 MED ORDER — DEXAMETHASONE SODIUM PHOSPHATE 4 MG/ML IJ SOLN
INTRAMUSCULAR | Status: DC | PRN
  Administered 2020-04-02: 8 mg via INTRAVENOUS

## 2020-04-02 MED ORDER — OXYTOCIN-SODIUM CHLORIDE 30-0.9 UT/500ML-% IV SOLN
INTRAVENOUS | Status: AC
  Filled 2020-04-02: qty 500

## 2020-04-02 MED ORDER — DIPHENHYDRAMINE HCL 25 MG PO CAPS: 25.0000 mg | ORAL_CAPSULE | ORAL | Status: DC | PRN

## 2020-04-02 MED ORDER — ONDANSETRON HCL 4 MG/2ML IJ SOLN
INTRAMUSCULAR | Status: DC | PRN
  Administered 2020-04-02: 4 mg via INTRAVENOUS

## 2020-04-02 MED ORDER — DIPHENHYDRAMINE HCL 50 MG/ML IJ SOLN: 12.5000 mg | INTRAMUSCULAR | Status: DC | PRN

## 2020-04-02 MED ORDER — MORPHINE SULFATE (PF) 0.5 MG/ML IJ SOLN
INTRAMUSCULAR | Status: DC | PRN
  Administered 2020-04-02: 150 ug via INTRATHECAL

## 2020-04-02 MED ORDER — ACETAMINOPHEN 325 MG PO TABS
650.0000 mg | ORAL_TABLET | ORAL | Status: DC | PRN
  Administered 2020-04-02 – 2020-04-03 (×3): 650 mg via ORAL
  Filled 2020-04-02 (×3): qty 2

## 2020-04-02 MED ORDER — NALOXONE HCL 0.4 MG/ML IJ SOLN: 0.4000 mg | INTRAMUSCULAR | Status: DC | PRN

## 2020-04-02 MED ORDER — DEXTROSE 5 % IV SOLN
INTRAVENOUS | Status: DC | PRN
  Administered 2020-04-02: 3 g via INTRAVENOUS

## 2020-04-02 MED ORDER — POVIDONE-IODINE 10 % EX SWAB: 2.0000 "application " | Freq: Once | CUTANEOUS | Status: DC

## 2020-04-02 MED ORDER — FENTANYL CITRATE (PF) 100 MCG/2ML IJ SOLN
INTRAMUSCULAR | Status: DC | PRN
  Administered 2020-04-02: 15 ug via INTRATHECAL

## 2020-04-02 MED ORDER — HYDROMORPHONE HCL 1 MG/ML IJ SOLN: 0.2500 mg | INTRAMUSCULAR | Status: DC | PRN

## 2020-04-02 MED ORDER — NALBUPHINE HCL 10 MG/ML IJ SOLN
5.0000 mg | Freq: Once | INTRAMUSCULAR | Status: DC | PRN
Start: 2020-04-02 — End: 2020-04-06

## 2020-04-02 MED ORDER — SOD CITRATE-CITRIC ACID 500-334 MG/5ML PO SOLN
30.0000 mL | ORAL | Status: AC
  Administered 2020-04-02: 30 mL via ORAL

## 2020-04-02 MED ORDER — NALOXONE HCL 4 MG/10ML IJ SOLN
1.0000 ug/kg/h | INTRAVENOUS | Status: DC | PRN
  Filled 2020-04-02: qty 5

## 2020-04-02 MED ORDER — TETANUS-DIPHTH-ACELL PERTUSSIS 5-2.5-18.5 LF-MCG/0.5 IM SUSY: 0.5000 mL | PREFILLED_SYRINGE | Freq: Once | INTRAMUSCULAR | Status: DC

## 2020-04-02 MED ORDER — COCONUT OIL OIL: 1.0000 "application " | TOPICAL_OIL | Status: DC | PRN

## 2020-04-02 MED ORDER — DEXAMETHASONE SODIUM PHOSPHATE 4 MG/ML IJ SOLN
INTRAMUSCULAR | Status: AC
  Filled 2020-04-02: qty 2

## 2020-04-02 MED ORDER — SODIUM CHLORIDE 0.9 % IV SOLN
500.0000 mg | Freq: Once | INTRAVENOUS | Status: AC
  Administered 2020-04-02: 500 mg via INTRAVENOUS
  Filled 2020-04-02: qty 25

## 2020-04-02 MED ORDER — FAMOTIDINE IN NACL 20-0.9 MG/50ML-% IV SOLN
20.0000 mg | Freq: Once | INTRAVENOUS | Status: AC
  Administered 2020-04-02: 20 mg via INTRAVENOUS
  Filled 2020-04-02: qty 50

## 2020-04-02 MED ORDER — PHENYLEPHRINE HCL-NACL 20-0.9 MG/250ML-% IV SOLN
INTRAVENOUS | Status: AC
  Filled 2020-04-02: qty 250

## 2020-04-02 MED ORDER — OXYCODONE HCL 5 MG PO TABS: 5.0000 mg | ORAL_TABLET | Freq: Once | ORAL | Status: DC | PRN

## 2020-04-02 MED ORDER — LACTATED RINGERS IV SOLN: INTRAVENOUS | Status: DC

## 2020-04-02 MED ORDER — GLUCOSE 40 % PO GEL
1.0000 | Freq: Once | ORAL | Status: AC
  Administered 2020-04-02: 37.5 g via ORAL

## 2020-04-02 MED ORDER — ALBUMIN HUMAN 5 % IV SOLN: INTRAVENOUS | Status: DC | PRN

## 2020-04-02 MED ORDER — METOCLOPRAMIDE HCL 5 MG/ML IJ SOLN
INTRAMUSCULAR | Status: AC
  Filled 2020-04-02: qty 2

## 2020-04-02 MED ORDER — PHENYLEPHRINE HCL (PRESSORS) 10 MG/ML IV SOLN
INTRAVENOUS | Status: DC | PRN
  Administered 2020-04-02 (×2): 80 ug via INTRAVENOUS
  Administered 2020-04-02: 120 ug via INTRAVENOUS
  Administered 2020-04-02 (×2): 80 ug via INTRAVENOUS
  Administered 2020-04-02 (×2): 40 ug via INTRAVENOUS

## 2020-04-02 MED ORDER — NALBUPHINE HCL 10 MG/ML IJ SOLN: 5.0000 mg | INTRAMUSCULAR | Status: DC | PRN

## 2020-04-02 MED ORDER — PHENYLEPHRINE HCL-NACL 20-0.9 MG/250ML-% IV SOLN
INTRAVENOUS | Status: DC | PRN
  Administered 2020-04-02: 60 ug/min via INTRAVENOUS

## 2020-04-02 MED ORDER — OXYTOCIN-SODIUM CHLORIDE 30-0.9 UT/500ML-% IV SOLN
INTRAVENOUS | Status: DC | PRN
  Administered 2020-04-02: 300 mL via INTRAVENOUS

## 2020-04-02 MED ORDER — BUPIVACAINE IN DEXTROSE 0.75-8.25 % IT SOLN
INTRATHECAL | Status: DC | PRN
  Administered 2020-04-02: 12.75 mg via INTRATHECAL

## 2020-04-02 MED ORDER — KETOROLAC TROMETHAMINE 30 MG/ML IJ SOLN
INTRAMUSCULAR | Status: AC
  Filled 2020-04-02: qty 1

## 2020-04-02 MED ORDER — OXYCODONE HCL 5 MG/5ML PO SOLN: 5.0000 mg | Freq: Once | ORAL | Status: DC | PRN

## 2020-04-02 MED ORDER — KETOROLAC TROMETHAMINE 30 MG/ML IJ SOLN: 30.0000 mg | Freq: Once | INTRAMUSCULAR | Status: DC

## 2020-04-02 MED ORDER — SOD CITRATE-CITRIC ACID 500-334 MG/5ML PO SOLN
ORAL | Status: AC
  Filled 2020-04-02: qty 30

## 2020-04-02 MED ORDER — LACTATED RINGERS IV SOLN: INTRAVENOUS | Status: DC | PRN

## 2020-04-02 MED ORDER — IBUPROFEN 800 MG PO TABS
800.0000 mg | ORAL_TABLET | Freq: Four times a day (QID) | ORAL | Status: DC
  Administered 2020-04-03 – 2020-04-04 (×3): 800 mg via ORAL
  Filled 2020-04-02 (×3): qty 1

## 2020-04-02 MED ORDER — WITCH HAZEL-GLYCERIN EX PADS: 1.0000 "application " | MEDICATED_PAD | CUTANEOUS | Status: DC | PRN

## 2020-04-02 MED ORDER — MORPHINE SULFATE (PF) 0.5 MG/ML IJ SOLN
INTRAMUSCULAR | Status: AC
  Filled 2020-04-02: qty 10

## 2020-04-02 MED ORDER — DEXTROSE 5 % IV SOLN: 3.0000 g | INTRAVENOUS | Status: DC

## 2020-04-02 MED ORDER — PROMETHAZINE HCL 25 MG/ML IJ SOLN
12.5000 mg | Freq: Once | INTRAMUSCULAR | Status: AC
  Administered 2020-04-02: 12.5 mg via INTRAVENOUS
  Filled 2020-04-02: qty 1

## 2020-04-02 MED ORDER — SIMETHICONE 80 MG PO CHEW
80.0000 mg | CHEWABLE_TABLET | Freq: Three times a day (TID) | ORAL | Status: DC
  Administered 2020-04-02 – 2020-04-06 (×11): 80 mg via ORAL
  Filled 2020-04-02 (×12): qty 1

## 2020-04-02 MED ORDER — BUPIVACAINE HCL (PF) 0.25 % IJ SOLN
INTRAMUSCULAR | Status: AC
  Filled 2020-04-02: qty 30

## 2020-04-02 MED ORDER — KETOROLAC TROMETHAMINE 30 MG/ML IJ SOLN
30.0000 mg | Freq: Once | INTRAMUSCULAR | Status: AC | PRN
  Administered 2020-04-02: 30 mg via INTRAVENOUS

## 2020-04-02 MED ORDER — STERILE WATER FOR IRRIGATION IR SOLN
Status: DC | PRN
  Administered 2020-04-02: 1

## 2020-04-02 MED ORDER — SCOPOLAMINE 1 MG/3DAYS TD PT72: 1.0000 | MEDICATED_PATCH | Freq: Once | TRANSDERMAL | Status: DC

## 2020-04-02 MED ORDER — FERROUS SULFATE 325 (65 FE) MG PO TABS
325.0000 mg | ORAL_TABLET | ORAL | Status: DC
  Administered 2020-04-02 – 2020-04-06 (×3): 325 mg via ORAL
  Filled 2020-04-02 (×3): qty 1

## 2020-04-02 MED ORDER — VITAMIN C 250 MG PO TABS
250.0000 mg | ORAL_TABLET | ORAL | Status: DC
  Administered 2020-04-02 – 2020-04-06 (×3): 250 mg via ORAL
  Filled 2020-04-02 (×3): qty 1

## 2020-04-02 MED ORDER — OXYCODONE HCL 5 MG PO TABS
5.0000 mg | ORAL_TABLET | ORAL | Status: DC | PRN
Start: 2020-04-02 — End: 2020-04-06
  Administered 2020-04-03: 5 mg via ORAL
  Administered 2020-04-03 – 2020-04-04 (×4): 10 mg via ORAL
  Administered 2020-04-06: 5 mg via ORAL
  Filled 2020-04-02 (×3): qty 2
  Filled 2020-04-02 (×2): qty 1
  Filled 2020-04-02 (×2): qty 2

## 2020-04-02 MED ORDER — NALBUPHINE HCL 10 MG/ML IJ SOLN
5.0000 mg | INTRAMUSCULAR | Status: DC | PRN
Start: 2020-04-02 — End: 2020-04-06
  Administered 2020-04-02: 5 mg via INTRAVENOUS
  Filled 2020-04-02: qty 1

## 2020-04-02 MED ORDER — SODIUM CHLORIDE 0.9 % IV SOLN: 510.0000 mg | INTRAVENOUS | Status: DC

## 2020-04-02 MED ORDER — FENTANYL CITRATE (PF) 100 MCG/2ML IJ SOLN
INTRAMUSCULAR | Status: AC
  Filled 2020-04-02: qty 2

## 2020-04-02 MED ORDER — DIBUCAINE (PERIANAL) 1 % EX OINT
1.0000 | TOPICAL_OINTMENT | CUTANEOUS | Status: DC | PRN
Start: 2020-04-02 — End: 2020-04-06

## 2020-04-02 MED ORDER — ONDANSETRON HCL 4 MG/2ML IJ SOLN
4.0000 mg | Freq: Three times a day (TID) | INTRAMUSCULAR | Status: DC | PRN
  Filled 2020-04-02: qty 2

## 2020-04-02 MED ORDER — OXYTOCIN-SODIUM CHLORIDE 30-0.9 UT/500ML-% IV SOLN
2.5000 [IU]/h | INTRAVENOUS | Status: AC
  Administered 2020-04-02: 2.5 [IU]/h via INTRAVENOUS
  Filled 2020-04-02: qty 500

## 2020-04-02 MED ORDER — INSULIN PUMP
Freq: Three times a day (TID) | SUBCUTANEOUS | Status: DC
  Administered 2020-04-04: 11.2 via SUBCUTANEOUS
  Administered 2020-04-04: 15 via SUBCUTANEOUS
  Administered 2020-04-04: 6.4 via SUBCUTANEOUS
  Administered 2020-04-06: 18 via SUBCUTANEOUS
  Filled 2020-04-02: qty 1

## 2020-04-02 MED ORDER — ONDANSETRON HCL 4 MG/2ML IJ SOLN
INTRAMUSCULAR | Status: AC
  Filled 2020-04-02: qty 2

## 2020-04-02 MED ORDER — ONDANSETRON HCL 4 MG/2ML IJ SOLN
4.0000 mg | INTRAMUSCULAR | Status: DC | PRN
  Administered 2020-04-02: 4 mg via INTRAVENOUS

## 2020-04-02 MED ORDER — MENTHOL 3 MG MT LOZG: 1.0000 | LOZENGE | OROMUCOSAL | Status: DC | PRN

## 2020-04-02 MED ORDER — SIMETHICONE 80 MG PO CHEW
80.0000 mg | CHEWABLE_TABLET | ORAL | Status: DC | PRN
Start: 2020-04-02 — End: 2020-04-06

## 2020-04-02 MED ORDER — PRENATAL MULTIVITAMIN CH
1.0000 | ORAL_TABLET | Freq: Every day | ORAL | Status: DC
  Administered 2020-04-03 – 2020-04-06 (×4): 1 via ORAL
  Filled 2020-04-02 (×4): qty 1

## 2020-04-02 MED ORDER — PHENYLEPHRINE 40 MCG/ML (10ML) SYRINGE FOR IV PUSH (FOR BLOOD PRESSURE SUPPORT)
PREFILLED_SYRINGE | INTRAVENOUS | Status: AC
  Filled 2020-04-02: qty 10

## 2020-04-02 MED ORDER — BUPIVACAINE HCL 0.25 % IJ SOLN
INTRAMUSCULAR | Status: DC | PRN
  Administered 2020-04-02: 30 mL

## 2020-04-02 MED ORDER — SENNOSIDES-DOCUSATE SODIUM 8.6-50 MG PO TABS
2.0000 | ORAL_TABLET | ORAL | Status: DC
  Administered 2020-04-04: 2 via ORAL
  Filled 2020-04-02 (×3): qty 2

## 2020-04-02 MED ORDER — ALBUMIN HUMAN 5 % IV SOLN
INTRAVENOUS | Status: AC
  Filled 2020-04-02: qty 250

## 2020-04-02 MED ORDER — ONDANSETRON HCL 4 MG/2ML IJ SOLN: 4.0000 mg | Freq: Once | INTRAMUSCULAR | Status: DC | PRN

## 2020-04-02 MED ORDER — DEXTROSE 5 % IV SOLN
INTRAVENOUS | Status: AC
  Filled 2020-04-02: qty 3000

## 2020-04-02 MED ORDER — EPHEDRINE SULFATE 50 MG/ML IJ SOLN
INTRAMUSCULAR | Status: DC | PRN
  Administered 2020-04-02: 10 mg via INTRAVENOUS

## 2020-04-02 MED ORDER — METOCLOPRAMIDE HCL 5 MG/ML IJ SOLN
INTRAMUSCULAR | Status: DC | PRN
  Administered 2020-04-02: 10 mg via INTRAVENOUS

## 2020-04-02 MED ORDER — ENOXAPARIN SODIUM 60 MG/0.6ML ~~LOC~~ SOLN
60.0000 mg | SUBCUTANEOUS | Status: DC
  Administered 2020-04-03 – 2020-04-06 (×4): 60 mg via SUBCUTANEOUS
  Filled 2020-04-02 (×4): qty 0.6

## 2020-04-02 MED ORDER — MEASLES, MUMPS & RUBELLA VAC IJ SOLR: 0.5000 mL | Freq: Once | INTRAMUSCULAR | Status: DC

## 2020-04-02 MED ORDER — KETOROLAC TROMETHAMINE 30 MG/ML IJ SOLN
30.0000 mg | Freq: Four times a day (QID) | INTRAMUSCULAR | Status: AC
  Administered 2020-04-02 – 2020-04-03 (×4): 30 mg via INTRAVENOUS
  Filled 2020-04-02 (×4): qty 1

## 2020-04-02 MED ORDER — SCOPOLAMINE 1 MG/3DAYS TD PT72
MEDICATED_PATCH | TRANSDERMAL | Status: DC | PRN
  Administered 2020-04-02: 1 via TRANSDERMAL

## 2020-04-02 MED ORDER — DIPHENHYDRAMINE HCL 25 MG PO CAPS
25.0000 mg | ORAL_CAPSULE | Freq: Four times a day (QID) | ORAL | Status: DC | PRN
  Administered 2020-04-03: 25 mg via ORAL
  Filled 2020-04-02: qty 1

## 2020-04-02 MED ORDER — SODIUM CHLORIDE 0.9% FLUSH: 3.0000 mL | INTRAVENOUS | Status: DC | PRN

## 2020-04-02 MED ORDER — GLUCOSE 40 % PO GEL
ORAL | Status: AC
  Filled 2020-04-02: qty 1

## 2020-04-02 SURGICAL SUPPLY — 40 items
BENZOIN TINCTURE PRP APPL 2/3 (GAUZE/BANDAGES/DRESSINGS) ×2 IMPLANT
CANISTER PREVENA PLUS 150 (CANNISTER) ×2 IMPLANT
CLAMP CORD UMBIL (MISCELLANEOUS) ×2 IMPLANT
CLOTH BEACON ORANGE TIMEOUT ST (SAFETY) ×2 IMPLANT
DRESSING PREVENA PLUS CUSTOM (GAUZE/BANDAGES/DRESSINGS) ×1 IMPLANT
DRSG OPSITE POSTOP 4X10 (GAUZE/BANDAGES/DRESSINGS) ×2 IMPLANT
DRSG PREVENA PLUS CUSTOM (GAUZE/BANDAGES/DRESSINGS) ×2
ELECT REM PT RETURN 9FT ADLT (ELECTROSURGICAL) ×2
ELECTRODE REM PT RTRN 9FT ADLT (ELECTROSURGICAL) ×1 IMPLANT
EXTENDER TRAXI PANNICULUS (MISCELLANEOUS) ×1 IMPLANT
EXTRACTOR VACUUM KIWI (MISCELLANEOUS) ×2 IMPLANT
EXTRACTOR VACUUM M CUP 4 TUBE (SUCTIONS) ×2 IMPLANT
GLOVE BIOGEL PI IND STRL 7.0 (GLOVE) ×3 IMPLANT
GLOVE BIOGEL PI INDICATOR 7.0 (GLOVE) ×3
GLOVE ECLIPSE 7.0 STRL STRAW (GLOVE) ×2 IMPLANT
GOWN STRL REUS W/TWL LRG LVL3 (GOWN DISPOSABLE) ×4 IMPLANT
HOVERMATT SINGLE USE (MISCELLANEOUS) ×2 IMPLANT
KIT ABG SYR 3ML LUER SLIP (SYRINGE) ×2 IMPLANT
NEEDLE HYPO 22GX1.5 SAFETY (NEEDLE) ×2 IMPLANT
NEEDLE HYPO 25X5/8 SAFETYGLIDE (NEEDLE) ×2 IMPLANT
NS IRRIG 1000ML POUR BTL (IV SOLUTION) ×2 IMPLANT
PACK C SECTION WH (CUSTOM PROCEDURE TRAY) ×2 IMPLANT
PAD ABD 7.5X8 STRL (GAUZE/BANDAGES/DRESSINGS) ×2 IMPLANT
PAD OB MATERNITY 4.3X12.25 (PERSONAL CARE ITEMS) ×2 IMPLANT
PENCIL SMOKE EVAC W/HOLSTER (ELECTROSURGICAL) ×2 IMPLANT
RETRACTOR TRAXI PANNICULUS (MISCELLANEOUS) ×1 IMPLANT
RTRCTR C-SECT PINK 25CM LRG (MISCELLANEOUS) ×2 IMPLANT
STRIP CLOSURE SKIN 1/2X4 (GAUZE/BANDAGES/DRESSINGS) ×2 IMPLANT
SUT MNCRL 0 VIOLET CTX 36 (SUTURE) ×2 IMPLANT
SUT MONOCRYL 0 CTX 36 (SUTURE) ×2
SUT PLAIN 2 0 XLH (SUTURE) ×2 IMPLANT
SUT VIC AB 0 CTX 36 (SUTURE) ×1
SUT VIC AB 0 CTX36XBRD ANBCTRL (SUTURE) ×1 IMPLANT
SUT VIC AB 4-0 KS 27 (SUTURE) ×2 IMPLANT
SYR 30ML LL (SYRINGE) ×2 IMPLANT
TOWEL OR 17X24 6PK STRL BLUE (TOWEL DISPOSABLE) ×2 IMPLANT
TRAXI PANNICULUS EXTENDER (MISCELLANEOUS) ×1
TRAXI PANNICULUS RETRACTOR (MISCELLANEOUS) ×1
TRAY FOLEY W/BAG SLVR 14FR LF (SET/KITS/TRAYS/PACK) ×2 IMPLANT
WATER STERILE IRR 1000ML POUR (IV SOLUTION) ×2 IMPLANT

## 2020-04-02 NOTE — Progress Notes (Signed)
Hypoglycemic Event  CBG: 47  Treatment: 8 oz juice/soda   Symptoms: Pale and vomitting  Follow-up CBG: Time:1325 CBG Result:47  Possible Reasons for Event: Inadequate meal intake and Other: surgery  Comments/MD notified:Called Mart Piggs, MD and Dr. Shawnie Pons - Dr. Richardson Landry notified also and we gave patient strawberry italian ice and Glutose 15 gel.    Molly Savage

## 2020-04-02 NOTE — Anesthesia Procedure Notes (Signed)
Spinal  Patient location during procedure: OB Start time: 04/02/2020 10:08 AM End time: 04/02/2020 10:10 AM Staffing Performed: anesthesiologist  Anesthesiologist: Trevor Iha, MD Preanesthetic Checklist Completed: patient identified, IV checked, risks and benefits discussed, surgical consent, monitors and equipment checked, pre-op evaluation and timeout performed Spinal Block Patient position: sitting Prep: DuraPrep and site prepped and draped Patient monitoring: heart rate, cardiac monitor, continuous pulse ox and blood pressure Approach: midline Location: L3-4 Injection technique: single-shot Needle Needle type: Pencan  Needle gauge: 27 G Needle length: 10 cm Assessment Sensory level: T4 Additional Notes  1 Attempt (s). Pt tolerated procedure well.

## 2020-04-02 NOTE — Discharge Summary (Signed)
Postpartum Discharge Summary    Patient Name: Molly Savage DOB: 1990/07/19 MRN: [id number]  Date of admission: 04/02/2020 Delivery date:04/02/2020  Delivering provider: Donnamae Jude  Date of discharge: 04/06/2020  Admitting diagnosis: History of cesarean section [Z98.891] Intrauterine pregnancy: [redacted]w[redacted]d    Secondary diagnosis:  Active Problems:   Type 1 diabetes mellitus with retinopathy ([place]   Tobacco abuse   Diabetic foot infection ([place]   BMI 40.0-44.9, adult ([place]   Situational mixed anxiety and depressive disorder   Depression   Chronic hypertension   Morbid obesity ([place]   Supervision of high risk pregnancy, antepartum   History of cesarean section   PPH (postpartum hemorrhage)  Additional problems: none    Discharge diagnosis: Term Pregnancy Delivered, Preeclampsia (mild), CHTN, Anemia, PPH and type 1 DM                                              Post partum procedures:magnesium sulfate Augmentation: N/A Complications: [id number]>1324MW Hospital course: Sceduled C/S   30y.o. yo G2P1001 at 366w1das admitted to the hospital 04/02/2020 for scheduled cesarean section with the following indication:Elective Repeat.Delivery details are as follows:  Membrane Rupture Time/Date:  ,   Delivery Method:C-Section, Vacuum Assisted  Details of operation can be found in separate operative note. On POD#2, BP became severe range and uncontrolled with anti-hypertensives. She was started on anti-hypertensives which were titrated to good effect. Patient otherwise had an uncomplicated postpartum course.  She is ambulating, tolerating a regular diet, passing flatus, and urinating well. Patient is discharged home in stable condition on  04/06/20        Newborn Data: Birth date:04/02/2020  Birth time:10:56 AM  Gender:Female  Living status:Living  Apgars:7 ,8  Weight:3045 g     Magnesium Sulfate received: No BMZ received: No Rhophylac:N/A MMR:N/A T-DaP:Given prenatally Flu:  Yes Transfusion:No  Physical exam  Vitals:   04/06/20 0758 04/06/20 0759 04/06/20 1100 04/06/20 1211  BP:  140/79 (!) 152/86 (!) 156/79  Pulse:  94 98 91  Resp:  17 18   Temp: 97.9 F (36.6 C)  97.7 F (36.5 C)   TempSrc: Oral  Oral   SpO2:  98% 100%   Weight:      Height:       General: alert, cooperative and no distress Lochia: appropriate Uterine Fundus: unable to palpate 2/2 habitus Incision: Dressing is clean, dry, and intact DVT Evaluation: No evidence of DVT seen on physical exam. Labs: Lab Results  Component Value Date   WBC 9.8 04/05/2020   HGB 7.8 (L) 04/05/2020   HCT 23.9 (L) 04/05/2020   MCV 85.4 04/05/2020   PLT 206 04/05/2020   CMP Latest Ref Rng & Units 04/05/2020  Glucose 70 - 99 mg/dL 172(H)  BUN 6 - 20 mg/dL 10  Creatinine 0.44 - 1.00 mg/dL 0.61  Sodium 135 - 145 mmol/L 134(L)  Potassium 3.5 - 5.1 mmol/L 4.6  Chloride 98 - 111 mmol/L 106  CO2 22 - 32 mmol/L 23  Calcium 8.9 - 10.3 mg/dL 7.8(L)  Total Protein 6.5 - 8.1 g/dL 4.8(L)  Total Bilirubin 0.3 - 1.2 mg/dL 0.6  Alkaline Phos 38 - 126 U/L 71  AST 15 - 41 U/L 20  ALT 0 - 44 U/L 22   Edinburgh Score: Edinburgh Postnatal Depression Scale Screening Tool 04/04/2020  I have  been able to laugh and see the funny side of things. 0  I have looked forward with enjoyment to things. 0  I have blamed myself unnecessarily when things went wrong. 0  I have been anxious or worried for no good reason. 0  I have felt scared or panicky for no good reason. 0  Things have been getting on top of me. 1  I have been so unhappy that I have had difficulty sleeping. 0  I have felt sad or miserable. 0  I have been so unhappy that I have been crying. 0  The thought of harming myself has occurred to me. 0  Edinburgh Postnatal Depression Scale Total 1     After visit meds:  Allergies as of 04/06/2020   No Known Allergies     Medication List    STOP taking these medications   aspirin EC 81 MG tablet   Blood  Pressure Kit Devi   levocetirizine 5 MG tablet Commonly known as: XYZAL   loratadine 10 MG tablet Commonly known as: CLARITIN   prenatal multivitamin Tabs tablet   promethazine 25 MG tablet Commonly known as: PHENERGAN     TAKE these medications   acetaminophen 500 MG tablet Commonly known as: TYLENOL Take 1,000 mg by mouth every 6 (six) hours as needed for mild pain or headache.   calcium carbonate 500 MG chewable tablet Commonly known as: TUMS - dosed in mg elemental calcium Chew 2-4 tablets by mouth daily as needed for heartburn or indigestion.   docusate sodium 100 MG capsule Commonly known as: Colace Take 1 capsule (100 mg total) by mouth 2 (two) times daily.   labetalol 300 MG tablet Commonly known as: NORMODYNE Take 1 tablet (300 mg total) by mouth 2 (two) times daily. What changed: medication strength   NIFEdipine 60 MG 24 hr tablet Commonly known as: ADALAT CC Take 1 tablet (60 mg total) by mouth 2 (two) times daily.   NovoLOG 100 UNIT/ML injection Generic drug: insulin aspart Use as directed by endocrinologist What changed: additional instructions   omeprazole 20 MG capsule Commonly known as: PRILOSEC TAKE 1 CAPSULE BY MOUTH TWICE DAILY BEFORE A MEAL What changed: See the new instructions.   oxyCODONE 5 MG immediate release tablet Commonly known as: Oxy IR/ROXICODONE Take 1 tablet (5 mg total) by mouth every 6 (six) hours as needed for up to 5 days for moderate pain.   pantoprazole 40 MG tablet Commonly known as: PROTONIX TAKE 1 TABLET BY MOUTH ONCE DAILY 20 TO 30 MINUTES BEFORE SUPPER What changed: See the new instructions.            Discharge Care Instructions  (From admission, onward)         Start     Ordered   04/06/20 0000  Leave dressing on - Keep it clean, dry, and intact until clinic visit        04/06/20 0831           Discharge home in stable condition Infant Feeding: Breast Infant Disposition:home with  mother Discharge instruction: per After Visit Summary and Postpartum booklet. Activity: Advance as tolerated. Pelvic rest for 6 weeks.  Diet: carb modified diet Future Appointments: No future appointments. Follow up Visit:  [place] [facility] Follow up in 1 week(s).   Specialty: Obstetrics and Gynecology Contact information: [place] [id number] [phone]  Message sent to [facility] 04/02/20 by Sylvester Harder.   Please schedule this patient for a In person postpartum visit in 6 weeks with the following provider: MD. Additional Postpartum F/U:Postpartum Depression checkup, Incision check 1 week and BP check 1 week  High risk pregnancy complicated by: GDM and HTN Delivery mode:  C-Section, Vacuum Assisted  Anticipated Birth Control:  unknown   04/06/2020 Sloan Leiter, MD

## 2020-04-02 NOTE — Progress Notes (Signed)
Inpatient Diabetes Program Recommendations  AACE/ADA: New Consensus Statement on Inpatient Glycemic Control (2015)  Target Ranges:  Prepandial:   less than 140 mg/dL      Peak postprandial:   less than 180 mg/dL (1-2 hours)      Critically ill patients:  140 - 180 mg/dL   Lab Results  Component Value Date   GLUCAP 84 04/02/2020   HGBA1C 7.1 (H) 02/09/2020    Review of Glycemic Control  Diabetes history: DM1 Outpatient Diabetes medications: Medtronic Insulin Pump Current orders for Inpatient glycemic control: Medtronic insulin pump  Inpatient Diabetes Program Recommendations:    ASSESSMENT AND PLAN: DM1 - A1C is now 6.7% Per Endocrinologist office visit note from Dr. Phylis Bougie 02/22/20: "Adjusted her pump settings- increased her basal rates from 9 pm until 12 noon, increased her AM insulin to carb ratio, instructed patient to call the Diabetes care center and schedule follow up ASAP She is having high sugars at times when she is not eating well Neuropathy is stable Follow up in this clinic in 3 months"  Spoke with RN Abigail Polos regarding insulin pump use in patient. Patient has decreased her insulin pump settings to 1.55 units/hrs according to what she understood from her endocrinologist. Consider use of CGM to assist with frequent monitoring since patient is not wearing one. Insulin pump contract to be signed by patient and flowsheet to be kept @ bedside. Will follow during hospitalization.  Thank you, [name]. Benjaman Artman, RN, MSN, CDE  Diabetes Coordinator Inpatient Glycemic Control Team Team Pager [phone] (8am-5pm) 04/02/2020 5:11 PM

## 2020-04-02 NOTE — Transfer of Care (Signed)
Immediate Anesthesia Transfer of Care Note  Patient: Molly Savage  Procedure(s) Performed: CESAREAN SECTION (N/A )  Patient Location: PACU  Anesthesia Type:Spinal  Level of Consciousness: awake, alert  and oriented  Airway & Oxygen Therapy: Patient Spontanous Breathing  Post-op Assessment: Report given to RN and Post -op Vital signs reviewed and stable  Post vital signs: Reviewed and stable  Last Vitals:  Vitals Value Taken Time  BP 147/102 04/02/20 1205  Temp    Pulse 77 04/02/20 1208  Resp 14 04/02/20 1208  SpO2 99 % 04/02/20 1208  Vitals shown include unvalidated device data.  Last Pain:  Vitals:   04/02/20 0800  TempSrc: Oral         Complications: No complications documented.

## 2020-04-02 NOTE — Anesthesia Postprocedure Evaluation (Signed)
Anesthesia Post Note  Patient: Jameela Michna  Procedure(s) Performed: CESAREAN SECTION (N/A )     Patient location during evaluation: Mother Baby Anesthesia Type: Spinal Level of consciousness: oriented and awake and alert Pain management: pain level controlled Vital Signs Assessment: post-procedure vital signs reviewed and stable Respiratory status: spontaneous breathing and respiratory function stable Cardiovascular status: blood pressure returned to baseline and stable Postop Assessment: no headache, no backache, no apparent nausea or vomiting and able to ambulate Anesthetic complications: no   No complications documented.  Last Vitals:  Vitals:   04/02/20 1500 04/02/20 1605  BP: (!) 151/84 (!) 143/82  Pulse: 80 75  Resp: 18 19  Temp: (!) 36.4 C (!) 36.3 C  SpO2:      Last Pain:  Vitals:   04/02/20 1607  TempSrc:   PainSc: 7    Pain Goal:                   Trevor Iha

## 2020-04-02 NOTE — Progress Notes (Signed)
Hypoglycemic Event  CBG: 60  Treatment: recheck after past glucose intake Symptoms: none  Follow-up CBG: Time:1402 CBG Result:84  Possible Reasons for Event: Other: surgery, no intake, and pump  Comments/MD notified:Houser, MD and Mart Piggs, MD    Yisroel Ramming

## 2020-04-02 NOTE — Op Note (Signed)
Preoperative Diagnosis:  IUP @ [redacted]w[redacted]d, elective repeat cesarean section  Postoperative Diagnosis:  Same  Procedure: repeat low transverse cesarean section  Surgeon: Tinnie Gens, M.D.  Assistant: Mart Piggs, MD  Findings: Viable female infant, APGAR (1 MIN): 7   APGAR (5 MINS): 8   Weight pending, cephalic presentation  Estimated blood loss: 1299 cc  Complications: None known  Specimens: Placenta to labor and delivery  Reason for procedure: Briefly, the patient is a 30 y.o. G2P1001 [redacted]w[redacted]d who presents for elective repeat cesarean section, history of prior cesarean section for non reassuring fetal status.  Procedure: Patient is a to the OR where spinal analgesia was administered. She was then placed in a supine position with left lateral tilt. She received 3 g of Ancef and SCDs were in place. A timeout was performed. She was prepped and draped in the usual sterile fashion. A Foley catheter was placed in the bladder. A knife was then used to make a Pfannenstiel incision. This incision was carried out to underlying fascia which was divided in the midline with the knife. The incision was extended laterally, sharply and bluntly.  The rectus was divided in the midline.  The peritoneal cavity was entered bluntly.  Alexis retractor was placed inside the incision.  A knife was used to make a low transverse incision on the uterus. This incision was carried down to the amniotic cavity was entered. Difficulty delivering infant initially so bell vacuum was applied with 2 pulls and 1 pop off. The hysterotomy was then extended laterally and vertically with bandage scissors. A kiwi vacuum was then applied with 2 pulls and 1 pop off. Fetus was in right occiput anterior position and was brought up out of the incision. Cord was clamped x 2 and cut. Infant taken to waiting nurse.  Arterial cord gas was collected. Cord blood was obtained. Placenta was delivered from the uterus.  Uterus was cleaned with dry lap  pads. Uterine incision closed with 0 Monocryl suture in a locked running fashion. A second layer of 0 Monocryl in an imbricating fashion was used to achieve hemostasis. Alexis retractor was removed from the abdomen. Peritoneal closure was done with 0 Monocryl suture.  Fascia is closed with 0 Vicryl suture in a running fashion. Subcutaneous closure was performed with 0 plain gut suture.  Skin closed using 3-0 Vicryl on a Keith needle. Subcutaneous tissue infused with 30cc 0.25% Marcaine.  All instrument, needle and lap counts were correct x 2.  Patient was awake and taken to PACU stable.  Infant remained with mom in couplet care, stable.   Broadus John FirestoneMD 04/02/2020 12:08 PM

## 2020-04-02 NOTE — Interval H&P Note (Signed)
History and Physical Interval Note:  04/02/2020 9:45 AM  Molly Savage  has presented today for surgery, with the diagnosis of DM 1.  The various methods of treatment have been discussed with the patient and family. After consideration of risks, benefits and other options for treatment, the patient has consented to  Procedure(s): CESAREAN SECTION (N/A) as a surgical intervention.  The patient's history has been reviewed, patient examined, no change in status, stable for surgery.  I have reviewed the patient's chart and labs.  Questions were answered to the patient's satisfaction.     Reva Bores
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
## 2020-04-02 NOTE — Anesthesia Preprocedure Evaluation (Addendum)
Anesthesia Evaluation  Patient identified by MRN, date of birth, ID band Patient awake    Reviewed: Allergy & Precautions, NPO status , Patient's Chart, lab work & pertinent test results  Airway Mallampati: III  TM Distance: >3 FB Neck ROM: Full    Dental no notable dental hx. (+) Teeth Intact, Dental Advisory Given   Pulmonary Current Smoker,    Pulmonary exam normal breath sounds clear to auscultation       Cardiovascular hypertension, Pt. on home beta blockers and Pt. on medications Normal cardiovascular exam Rhythm:Regular Rate:Normal  chronic HTN  w Pre clampsia   Neuro/Psych Anxiety    GI/Hepatic Neg liver ROS, GERD  ,  Endo/Other  diabetes, Well Controlled, Type 1, Insulin DependentPt w Insulin Pump  Renal/GU negative Renal ROS     Musculoskeletal   Abdominal (+) + obese,   Peds  Hematology  (+) Blood dyscrasia, anemia , Lab Results      Component                Value               Date                      WBC                      11.7 (H)            03/31/2020                HGB                      9.1 (L)             03/31/2020                HCT                      27.5 (L)            03/31/2020                MCV                      81.4                03/31/2020                PLT                      288                 03/31/2020           Lab Results      Component                Value               Date                      WBC                      12.4 (H)            04/02/2020                HGB  9.5 (L)             04/02/2020                HCT                      30.0 (L)            04/02/2020                MCV                      84.3                04/02/2020                PLT                      265                 04/02/2020              Anesthesia Other Findings   Reproductive/Obstetrics (+) Pregnancy                         Anesthesia  Physical Anesthesia Plan  ASA: III  Anesthesia Plan: Spinal   Post-op Pain Management:    Induction:   PONV Risk Score and Plan: Treatment may vary due to age or medical condition, Ondansetron and Dexamethasone  Airway Management Planned: Natural Airway  Additional Equipment: None  Intra-op Plan:   Post-operative Plan:   Informed Consent: I have reviewed the patients History and Physical, chart, labs and discussed the procedure including the risks, benefits and alternatives for the proposed anesthesia with the patient or authorized representative who has indicated his/her understanding and acceptance.     Dental advisory given  Plan Discussed with: CRNA and Anesthesiologist  Anesthesia Plan Comments: (37.4 Wk G2P1 w cHtn Pre E and MO for Rpt C/s under spinal )        Anesthesia Quick Evaluation

## 2020-04-02 NOTE — Progress Notes (Signed)
Hypoglycemic Event  CBG: 47  Treatment: 1 tube glucose gel and Luigi's strawberry icy  Symptoms: None  Follow-up CBG: Time:1340 CBG Result:60  Possible Reasons for Event: Other: surgery and pump infusing basal rate  Comments/MD notified:Alicia Germaine Pomfret - turn pump off and recheck CBG    Yisroel Ramming

## 2020-04-03 ENCOUNTER — Inpatient Hospital Stay (HOSPITAL_COMMUNITY): Admission: RE | Admit: 2020-04-03 | Payer: Medicaid Other | Source: Ambulatory Visit

## 2020-04-03 LAB — COMPREHENSIVE METABOLIC PANEL
ALT: 18 U/L (ref 0–44)
AST: 28 U/L (ref 15–41)
Albumin: 1.9 g/dL — ABNORMAL LOW (ref 3.5–5.0)
Alkaline Phosphatase: 74 U/L (ref 38–126)
Anion gap: 5 (ref 5–15)
BUN: 13 mg/dL (ref 6–20)
CO2: 23 mmol/L (ref 22–32)
Calcium: 8.5 mg/dL — ABNORMAL LOW (ref 8.9–10.3)
Chloride: 107 mmol/L (ref 98–111)
Creatinine, Ser: 0.73 mg/dL (ref 0.44–1.00)
GFR, Estimated: >60 mL/min (ref >60)
Glucose, Bld: 73 mg/dL (ref 70–99)
Potassium: 3.9 mmol/L (ref 3.5–5.1)
Sodium: 135 mmol/L (ref 135–145)
Total Bilirubin: 0.7 mg/dL (ref 0.3–1.2)
Total Protein: 4.5 g/dL — ABNORMAL LOW (ref 6.5–8.1)

## 2020-04-03 LAB — CBC
HCT: 23.7 % — ABNORMAL LOW (ref 36.0–46.0)
Hemoglobin: 7.5 g/dL — ABNORMAL LOW (ref 12.0–15.0)
MCH: 26.8 pg (ref 26.0–34.0)
MCHC: 31.6 g/dL (ref 30.0–36.0)
MCV: 84.6 fL (ref 80.0–100.0)
Platelets: 230 10*3/uL (ref 150–400)
RBC: 2.8 MIL/uL — ABNORMAL LOW (ref 3.87–5.11)
RDW: 16 % — ABNORMAL HIGH (ref 11.5–15.5)
WBC: 13.5 10*3/uL — ABNORMAL HIGH (ref 4.0–10.5)
nRBC: 0 % (ref 0.0–0.2)

## 2020-04-03 MED ORDER — AMLODIPINE BESYLATE 5 MG PO TABS
10.0000 mg | ORAL_TABLET | Freq: Every day | ORAL | Status: DC
  Administered 2020-04-04: 10 mg via ORAL
  Filled 2020-04-03: qty 2

## 2020-04-03 MED ORDER — ACETAMINOPHEN 500 MG PO TABS
1000.0000 mg | ORAL_TABLET | Freq: Four times a day (QID) | ORAL | Status: DC | PRN
  Administered 2020-04-03: 1000 mg via ORAL
  Filled 2020-04-03: qty 2

## 2020-04-03 MED ORDER — ACETAMINOPHEN 500 MG PO TABS
1000.0000 mg | ORAL_TABLET | Freq: Four times a day (QID) | ORAL | Status: DC
  Administered 2020-04-04 – 2020-04-06 (×9): 1000 mg via ORAL
  Filled 2020-04-03 (×11): qty 2

## 2020-04-03 MED ORDER — FUROSEMIDE 10 MG/ML IJ SOLN
20.0000 mg | Freq: Once | INTRAMUSCULAR | Status: AC
  Administered 2020-04-03: 20 mg via INTRAVENOUS
  Filled 2020-04-03: qty 4

## 2020-04-03 MED ORDER — POLYETHYLENE GLYCOL 3350 17 G PO PACK
17.0000 g | PACK | Freq: Every day | ORAL | Status: DC
  Administered 2020-04-04: 17 g via ORAL
  Filled 2020-04-03 (×3): qty 1

## 2020-04-03 NOTE — Clinical Note (Incomplete)
Patient throughout my shift has been very irritable, very upset and frustrated due to having gas pain, and limited movement. During my 2345 round she started crying and screaming out in pain. She started cussing and said "she is done with this." I tried to reassure her and offer assistance to which she refused. I medicated her with 10mg  of oxycodone and 1,000mg  of tylenol. She is very restless and I tried to get her to calm down to prevent her from harming herself more. I informed her that I was going to call the doctor to inform him that pain is not well controlled and to please come evaluate her. I spoke to from first call L&D and explained the situation to him. He states he is going to come up and evaluate her

## 2020-04-03 NOTE — Progress Notes (Signed)
Inpatient Diabetes Program Recommendations  AACE/ADA: New Consensus Statement on Inpatient Glycemic Control (2015)  Target Ranges:  Prepandial:   less than 140 mg/dL      Peak postprandial:   less than 180 mg/dL (1-2 hours)      Critically ill patients:  140 - 180 mg/dL   Lab Results  Component Value Date   GLUCAP 102 (H) 04/02/2020   HGBA1C 7.1 (H) 02/09/2020    Review of Glycemic Control Results for Molly Savage, Molly Savage (MRN [id number]) as of 04/03/2020 09:49  Ref. Range 04/02/2020 13:05 04/02/2020 13:19 04/02/2020 13:41 04/02/2020 14:02 04/02/2020 17:23  Glucose-Capillary Latest Ref Range: 70 - 99 mg/dL 47 (L) 47 (L) 60 (L) 84 102 (H)   Diabetes history: DM1 Outpatient Diabetes medications: Insulin Pump Current orders for Inpatient glycemic control: Insulin Pump-Temp basal set at 50%  Inpatient Diabetes Program Recommendations:    Spoke with patient on the phone this morning.  Her fast CGM reading was 73 mg/dL this morning.  Running low after delivery.  She states she has set her temporary basal settings to 50%.  She is not familiar with adjusting any other setting such as CHO ratio or basal time settings.  Asked her to call MD office this morning for new settings and for the office to walk her through changing her pump settings.  Will reach back out to patient this morning to follow up.    Called Dr. Lonn Georgia office (endocrinologist) and left message asking for nurse to reach out to patient for pump adjustments and instructions on how to make changes to her pump settings.   Will continue to follow while inpatient.  Thank you, Dulce Sellar, RN, BSN Diabetes Coordinator Inpatient Diabetes Program [phone] (team pager from 8a-5p)

## 2020-04-03 NOTE — Social Work (Signed)
MOB was referred for history of depression and anxiety.   * Referral screened out by Clinical Social Worker because none of the following criteria appear to apply:  ~ History of anxiety/depression during this pregnancy, or of post-partum depression following prior delivery. ~ Diagnosis of anxiety and/or depression within last 3 years. CSW reviewed chart and notes a diagnosis date of 2017. OR * MOB's symptoms currently being treated with medication and/or therapy.  Please contact the Clinical Social Worker if needs arise, by MOB request, or if MOB scores greater than 9/yes to question 10 on Edinburgh Postpartum Depression Screen.  Chaney Johnson, LCSWA Clinical Social Work [facility]  [phone] 

## 2020-04-03 NOTE — Lactation Note (Signed)
This note was copied from a baby's chart. Lactation Consultation Note  Patient Name: Molly Savage HYIFO'Y Date: 04/03/2020 Reason for consult: Initial assessment;Early term 37-38.6wks;Infant weight loss;Other (Comment) (when baby was crying LC noted a tongue restriction - mid line , lingual and heart shaped. LC pointed out to mom .) Age:32 hours   LC order not placed until 3/14 0450 - 20 hours of birth.  Baby has received mostly bottles with exception of 5 ml spoon fed initially.  LC offered to assist to see if baby would latch and baby was so hungry , even with an appetizer of formula, would not sustain a latch for only a few sucks and off  Baby needs to work on opening her mouth wider to obtain a deeper latch. With her tongue restriction may be difficult.  LC offered to set up a DEBP and explained supply and demand. Mom receptive to start pumping with the DEBP. LC set up. Mom was ordering lunch and was not ready to start yet.   LC Plan:  Offer the breast 8-12 times a day  Since the baby has been supplemented and moms feeding  Preference of breast / formula - supplement afterwards 30 ml.  Post pump both breast after every feeding for breast stimulation to bring the milk in , feed back to baby.     Maternal Data Has patient been taught Hand Expression?: Yes Does the patient have breastfeeding experience prior to this delivery?: Yes How long did the patient breastfeed?: per mom her 1st baby would never latch well , and milk did not come in, not many breast changes with her 1st baby either.  Feeding Mother's Current Feeding Choice: Breast Milk and Formula  LATCH Score Latch: Repeated attempts needed to sustain latch, nipple held in mouth throughout feeding, stimulation needed to elicit sucking reflex.  Audible Swallowing: None  Type of Nipple: Everted at rest and after stimulation  Comfort (Breast/Nipple): Soft / non-tender  Hold (Positioning): Assistance needed to  correctly position infant at breast and maintain latch.  LATCH Score: 6   Lactation Tools Discussed/Used Tools: Pump;Flanges Flange Size: 24 Breast pump type: Double-Electric Breast Pump Pump Education: Setup, frequency, and cleaning;Milk Storage Reason for Pumping: due to tongue mobility issues, baby being supplemented, and not latching well as of yet  Interventions Interventions: Breast feeding basics reviewed  Discharge WIC Program: Yes (per mom presently [place] , has moved to [facility] and would like the [facility] transferred.)  Consult Status Consult Status: Follow-up Date: 04/04/20 Follow-up type: In-patient    Matilde Sprang Elanore Talcott 04/03/2020, 2:26 PM

## 2020-04-03 NOTE — Progress Notes (Signed)
PT Cancellation Note  Patient Details Name: Molly Savage MRN: [id number] DOB: 08/11/1990   Cancelled Treatment:    Reason Eval/Treat Not Completed: PT screened, no needs identified, will sign off. Pt reports she is mobilizing without difficulty and that she was just stiff this AM after being in bed.    Angelina Ok [facility] 04/03/2020, 5:15 PM Skip Mayer PT Acute Rehabilitation Services Pager [phone] Office [phone]

## 2020-04-03 NOTE — Plan of Care (Signed)
  Problem: Education: Goal: Knowledge of General Education information will improve Description: Including pain rating scale, medication(s)/side effects and non-pharmacologic comfort measures Outcome: Progressing   Problem: Health Behavior/Discharge Planning: Goal: Ability to manage health-related needs will improve Outcome: Progressing   Problem: Clinical Measurements: Goal: Ability to maintain clinical measurements within normal limits will improve Outcome: Progressing Goal: Will remain free from infection Outcome: Progressing Goal: Diagnostic test results will improve Outcome: Progressing   Problem: Activity: Goal: Risk for activity intolerance will decrease Outcome: Progressing   Problem: Coping: Goal: Level of anxiety will decrease Outcome: Progressing   Problem: Pain Managment: Goal: General experience of comfort will improve Outcome: Progressing   Problem: Skin Integrity: Goal: Risk for impaired skin integrity will decrease Outcome: Progressing

## 2020-04-03 NOTE — Progress Notes (Addendum)
Patient throughout my shift has been very irritable, very upset and frustrated due to having gas pain, and limited movement. During my 2345 round she started crying and screaming out in pain. She started cussing and said "she is done with this." I tried to reassure her and offer assistance to which she refused at the time. Pt started hitting herself and bit her finger to stop herself from screaming. I medicated her with 10mg  of oxycodone and 1,000mg  of tylenol. She is very restless and I tried to get her to calm down to prevent her from harming herself more. I informed her that I was going to call the doctor to inform him that pain is not well controlled and to please come evaluate her. I spoke to [facility] from first call L&D and explained the situation to him. He states he is going to come up and evaluate her.

## 2020-04-03 NOTE — Progress Notes (Addendum)
Post Partum Day 1 Subjective: no complaints per pt. Per nursing, pt voiding at low rates (52mL/hr) with dark tea-colored urine overnight.  Objective: Blood pressure 126/60, pulse 71, temperature 98.1 F (36.7 C), temperature source Oral, resp. rate 16, last menstrual period 07/17/2019, SpO2 99 %, unknown if currently breastfeeding.  Physical Exam:  General: alert, cooperative and appears stated age Lochia: appropriate Uterine Fundus: firm Incision: healing well, no significant drainage, provena wound vac in place DVT Evaluation: No evidence of DVT seen on physical exam. 1-2+ LE edema bilaterally.  Recent Labs    04/02/20 1750 04/03/20 0520  HGB 8.7* 7.5*  HCT 27.0* 23.7*   Assessment/Plan: Plan for discharge for POD#3 after rCS (Hx of CS after non-reassuring fetal status).  #CHTN with superimposed preeclampsia: Pt with persistent mild range blood pressures s/p delivery. Most recently normal range. Continue Norvasc 5 mg daily. #GDMA2: Following with endocrine, using insulin pump, sugars well controlled. #Low UOP: Trial of lasix 20 mg IV given euvolemic on exam. Reassuringly, Cr wnl this morning. Plan to discontinue foley s/p lasix trial. #Anemia: Hgb 7.5 this morning s/p IV iron; pt declines pRBCs this morning but will plan to re-evaluate this AM   LOS: 1 day   Tilden Dome 04/03/2020, 7:36 AM   Attestation of Supervision of Student:  I confirm that I have verified the information documented in the resident's  note and that I have also personally reperformed the history, physical exam and all medical decision making activities.  I have verified that all services and findings are accurately documented in this student's note; and I agree with management and plan as outlined in the documentation. I have also made any necessary editorial changes.  Sheila Oats, MD Center for [facility], [facility] 04/03/2020 9:04 AM

## 2020-04-04 ENCOUNTER — Inpatient Hospital Stay (HOSPITAL_COMMUNITY): Payer: Medicaid Other

## 2020-04-04 ENCOUNTER — Ambulatory Visit: Payer: Medicaid Other

## 2020-04-04 LAB — CBC
HCT: 24.4 % — ABNORMAL LOW (ref 36.0–46.0)
Hemoglobin: 7.9 g/dL — ABNORMAL LOW (ref 12.0–15.0)
MCH: 27.7 pg (ref 26.0–34.0)
MCHC: 32.4 g/dL (ref 30.0–36.0)
MCV: 85.6 fL (ref 80.0–100.0)
Platelets: 210 10*3/uL (ref 150–400)
RBC: 2.85 MIL/uL — ABNORMAL LOW (ref 3.87–5.11)
RDW: 17.2 % — ABNORMAL HIGH (ref 11.5–15.5)
WBC: 10.2 10*3/uL (ref 4.0–10.5)
nRBC: 0 % (ref 0.0–0.2)

## 2020-04-04 LAB — BPAM RBC
Blood Product Expiration Date: 202204112359
Blood Product Expiration Date: 202204112359
Blood Product Expiration Date: 202204122359
Blood Product Expiration Date: 202204122359
Unit Type and Rh: 5100
Unit Type and Rh: 5100
Unit Type and Rh: 5100
Unit Type and Rh: 5100

## 2020-04-04 LAB — TYPE AND SCREEN
ABO/RH(D): O POS
Antibody Screen: NEGATIVE
Unit division: 0
Unit division: 0
Unit division: 0
Unit division: 0

## 2020-04-04 LAB — COMPREHENSIVE METABOLIC PANEL
ALT: 22 U/L (ref 0–44)
AST: 31 U/L (ref 15–41)
Albumin: 2.1 g/dL — ABNORMAL LOW (ref 3.5–5.0)
Alkaline Phosphatase: 85 U/L (ref 38–126)
Anion gap: 6 (ref 5–15)
BUN: 12 mg/dL (ref 6–20)
CO2: 22 mmol/L (ref 22–32)
Calcium: 8.7 mg/dL — ABNORMAL LOW (ref 8.9–10.3)
Chloride: 109 mmol/L (ref 98–111)
Creatinine, Ser: 0.62 mg/dL (ref 0.44–1.00)
GFR, Estimated: >60 mL/min (ref >60)
Glucose, Bld: 78 mg/dL (ref 70–99)
Potassium: 4.5 mmol/L (ref 3.5–5.1)
Sodium: 137 mmol/L (ref 135–145)
Total Bilirubin: 0.5 mg/dL (ref 0.3–1.2)
Total Protein: 5.1 g/dL — ABNORMAL LOW (ref 6.5–8.1)

## 2020-04-04 MED ORDER — TRIAMTERENE-HCTZ 37.5-25 MG PO TABS
1.0000 | ORAL_TABLET | Freq: Every day | ORAL | Status: DC
  Administered 2020-04-04: 1 via ORAL
  Filled 2020-04-04: qty 1

## 2020-04-04 MED ORDER — MAGNESIUM SULFATE BOLUS VIA INFUSION
6.0000 g | Freq: Once | INTRAVENOUS | Status: AC
  Administered 2020-04-04: 6 g via INTRAVENOUS
  Filled 2020-04-04: qty 1000

## 2020-04-04 MED ORDER — LABETALOL HCL 5 MG/ML IV SOLN: 40.0000 mg | INTRAVENOUS | Status: DC | PRN

## 2020-04-04 MED ORDER — NIFEDIPINE ER OSMOTIC RELEASE 30 MG PO TB24
30.0000 mg | ORAL_TABLET | Freq: Two times a day (BID) | ORAL | Status: DC
  Administered 2020-04-04 – 2020-04-05 (×3): 30 mg via ORAL
  Filled 2020-04-04 (×4): qty 1

## 2020-04-04 MED ORDER — HYDRALAZINE HCL 20 MG/ML IJ SOLN: 5.0000 mg | INTRAMUSCULAR | Status: DC | PRN

## 2020-04-04 MED ORDER — LABETALOL HCL 5 MG/ML IV SOLN: 20.0000 mg | INTRAVENOUS | Status: DC | PRN

## 2020-04-04 MED ORDER — HYDRALAZINE HCL 20 MG/ML IJ SOLN: 10.0000 mg | INTRAMUSCULAR | Status: DC | PRN

## 2020-04-04 MED ORDER — LABETALOL HCL 200 MG PO TABS
300.0000 mg | ORAL_TABLET | Freq: Two times a day (BID) | ORAL | Status: DC
  Administered 2020-04-04 – 2020-04-06 (×4): 300 mg via ORAL
  Filled 2020-04-04 (×4): qty 1

## 2020-04-04 MED ORDER — LABETALOL HCL 5 MG/ML IV SOLN
20.0000 mg | INTRAVENOUS | Status: DC | PRN
  Administered 2020-04-04: 20 mg via INTRAVENOUS
  Filled 2020-04-04 (×2): qty 4

## 2020-04-04 MED ORDER — GABAPENTIN 300 MG PO CAPS
300.0000 mg | ORAL_CAPSULE | Freq: Two times a day (BID) | ORAL | Status: DC
  Administered 2020-04-04 – 2020-04-06 (×6): 300 mg via ORAL
  Filled 2020-04-04 (×6): qty 1

## 2020-04-04 MED ORDER — MAGNESIUM SULFATE 40 GM/1000ML IV SOLN
2.0000 g/h | INTRAVENOUS | Status: DC
  Administered 2020-04-05: 2 g/h via INTRAVENOUS
  Filled 2020-04-04 (×3): qty 1000

## 2020-04-04 MED ORDER — LACTATED RINGERS IV SOLN: INTRAVENOUS | Status: DC

## 2020-04-04 MED ORDER — LABETALOL HCL 5 MG/ML IV SOLN: 80.0000 mg | INTRAVENOUS | Status: DC | PRN

## 2020-04-04 NOTE — Progress Notes (Addendum)
POSTPARTUM PROGRESS NOTE  Subjective: Molly Savage is a 30 y.o. G2P2002 s/p rLTCS at [redacted]w[redacted]d.  Overnight complained of abdominal pain, was hypertensive to >170 SBP this morning. She denies any problems with ambulating, voiding or po intake. Denies nausea or vomiting. She has passed flatus; however, has not had BM since CS. Pain is moderately controlled. Lochia is mild.  Objective: Blood pressure (!) 171/84, pulse 83, temperature 97.8 F (36.6 C), temperature source Oral, resp. rate 18, last menstrual period 07/17/2019, SpO2 100 %, unknown if currently breastfeeding.  Physical Exam:  General: alert, cooperative and no distress Chest: no respiratory distress Abdomen: soft, non-tender, bowel sounds present Uterine Fundus: firm and at level of umbilicus, provena wound vac in place Extremities: No calf swelling or tenderness  mild pedal edema  Recent Labs    04/02/20 1750 04/03/20 0520  HGB 8.7* 7.5*  HCT 27.0* 23.7*   Assessment/Plan: Molly Savage is a 30 y.o. G2P2002 s/p LTCS at [redacted]w[redacted]d admitted for scheduled elective repeat cesarean section..  Routine Postpartum Care: Doing well, pain well-controlled.  -- Continue routine care, lactation support  -- Feeding: Breast/Bottle -- cHTN with SIPE: Norvasc increased to 10 mg and added maxzide today. Given severe range BP this morning, persistent on repeat check, pt received IV labetalol 20mg  x1. Reassuringly no signs/symptoms of severe preeclampsia. Overall will continue to monitor and defer IV Mg pending repeat preeclampsia labs. Most likely severe blood pressures related to poor control of cHTN. -- T1DM: Sugars well controlled on 1/2 basal rate in addition to prn boluses. -- Anemia: Hgb 7.5 yesterday. Pt s/p IV venofer but declined pRBCs s/p counseling. -- Abdominal Pain: pt with worsening abdominal pain and minimal flatus s/p surgery. Will order KUB to r/o post-op ileus.  Dispo: Plan for discharge PPD#3.  Muus, ,  MD 04/04/2020 6:53 AM  Attestation of Supervision of Student:  I confirm that I have verified the information documented in the resident's note and that I have also personally reperformed the history, physical exam and all medical decision making activities.  I have verified that all services and findings are accurately documented in this student's note; and I agree with management and plan as outlined in the documentation. I have also made any necessary editorial changes.  04/06/2020, MD Center for [facility], [facility] 04/04/2020 8:57 AM

## 2020-04-04 NOTE — Progress Notes (Signed)
This AM BP was 173/94, repositoned and rechecked it 5 min later and it was 171/84. Dr. Sherilyn Dacosta walked in right as we were discussing the need to recheck it in 15 min. MD aware. Lynnda Shields came back right at the 15 min mark and asked me to re-check bp. BP was 160/92. MD put in labetelol protocol for 20mg  IV and went into the room to talk to patient. Radiology outside room to take pt to x-ray. MD asked for me to give medication then let radiology take pt for x-ray. She said pt can come back to the floor and for nurse to give her a call when she gets back to the unit so pt can be transferred to [facility].

## 2020-04-04 NOTE — Lactation Note (Signed)
This note was copied from a baby's chart. Lactation Consultation Note  Patient Name: Molly Savage [id number] Date: 04/04/2020   Age:32 hours  Mom's current feeding choice is pumping only and formula. LC earlier did a referral for [facility] loaner breast pump. LC entered the room, mom is being transferred from [facility] to [facility]  And mom  will be put on MAG.  Mom has been mostly formula feeding due to her  feeling she doesn't have any colostrum to give infant. Mom have wide space breast and infant has tongue restriction that is lingual and heart shape, mom has decided not to latch infant at the breast but wants to give infant any EBM that she has.. LC did hand expression with mom and she saw she has colostrum present in both breast infant was finger feed 4 drops of colostrum on LC gloved finger. Mom  has not used the DEBP yet but will start pumping every 3 hours for 15 minutes on initial setting plans to set timer on her phone to remind her when to pump. Mom knows  she might not see any colostrum at first, but if she keeps  Pumping every 3 hours it  will help stimulate and establish her milk supply.    Maternal Data    Feeding Nipple Type: Slow - flow  LATCH Score                    Lactation Tools Discussed/Used    Interventions    Discharge    Consult Status      Danelle Earthly 04/04/2020, 5:40 PM

## 2020-04-04 NOTE — Progress Notes (Signed)
Talked to MD again and was asked to give another juice and re-check sugar. Pt refused to drink another juice due to all the snacking shes done and is afraid it will be high. She allowed me to check it and it was 71. Pt states she feels fine. Will continue to monitor

## 2020-04-04 NOTE — Progress Notes (Signed)
Inpatient Diabetes Program Recommendations  AACE/ADA: New Consensus Statement on Inpatient Glycemic Control (2015)  Target Ranges:  Prepandial:   less than 140 mg/dL      Peak postprandial:   less than 180 mg/dL (1-2 hours)      Critically ill patients:  140 - 180 mg/dL   Lab Results  Component Value Date   GLUCAP 102 (H) 04/02/2020   HGBA1C 7.1 (H) 02/09/2020     Removed CGM from left arm and placed new Freestyle Libre 14 day CGM on right arm as requested.  Patient needs to speak with Dr. Michaelle Copas office prior to DC for pump adjustments.  She states she is going to call the office shortly.    Will continue to follow while inpatient.  Thank you, Dulce Sellar, RN, BSN Diabetes Coordinator Inpatient Diabetes Program [phone] (team pager from 8a-5p)

## 2020-04-04 NOTE — Progress Notes (Signed)
Here pod#2 from rltcs for chtn w/ sipe. Also t1dm and PPH (resolved). Asymptomatic, hellp labs negative this morning. Today persistent severe-range BPs despite amlodipine and maxide. Discussed w/ patient. Shared decision to start Mg for seizure ppx. Patient strongly desires to re-start labetalol, will order that along with procardia which has faster onset. Will d/c amlodipine, maxide, and ibuprofen.

## 2020-04-04 NOTE — Progress Notes (Signed)
Called Dr. Kingsley Callander to inform him of patient's hypoglycemic episodes. I informed him that I checked it three different times 15 min apart, gave juice and crackers in between with no change. No new orders given.
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
## 2020-04-05 ENCOUNTER — Encounter: Payer: Medicaid Other | Admitting: Obstetrics and Gynecology

## 2020-04-05 LAB — COMPREHENSIVE METABOLIC PANEL
ALT: 22 U/L (ref 0–44)
AST: 20 U/L (ref 15–41)
Albumin: 2 g/dL — ABNORMAL LOW (ref 3.5–5.0)
Alkaline Phosphatase: 71 U/L (ref 38–126)
Anion gap: 5 (ref 5–15)
BUN: 10 mg/dL (ref 6–20)
CO2: 23 mmol/L (ref 22–32)
Calcium: 7.8 mg/dL — ABNORMAL LOW (ref 8.9–10.3)
Chloride: 106 mmol/L (ref 98–111)
Creatinine, Ser: 0.61 mg/dL (ref 0.44–1.00)
GFR, Estimated: >60 mL/min (ref >60)
Glucose, Bld: 172 mg/dL — ABNORMAL HIGH (ref 70–99)
Potassium: 4.6 mmol/L (ref 3.5–5.1)
Sodium: 134 mmol/L — ABNORMAL LOW (ref 135–145)
Total Bilirubin: 0.6 mg/dL (ref 0.3–1.2)
Total Protein: 4.8 g/dL — ABNORMAL LOW (ref 6.5–8.1)

## 2020-04-05 LAB — MAGNESIUM: Magnesium: 4 mg/dL — ABNORMAL HIGH (ref 1.7–2.4)

## 2020-04-05 LAB — CBC
HCT: 23.9 % — ABNORMAL LOW (ref 36.0–46.0)
Hemoglobin: 7.8 g/dL — ABNORMAL LOW (ref 12.0–15.0)
MCH: 27.9 pg (ref 26.0–34.0)
MCHC: 32.6 g/dL (ref 30.0–36.0)
MCV: 85.4 fL (ref 80.0–100.0)
Platelets: 206 10*3/uL (ref 150–400)
RBC: 2.8 MIL/uL — ABNORMAL LOW (ref 3.87–5.11)
RDW: 17.6 % — ABNORMAL HIGH (ref 11.5–15.5)
WBC: 9.8 10*3/uL (ref 4.0–10.5)
nRBC: 0.3 % — ABNORMAL HIGH (ref 0.0–0.2)

## 2020-04-05 LAB — GLUCOSE, CAPILLARY: Glucose-Capillary: 83 mg/dL (ref 70–99)

## 2020-04-05 MED ORDER — CYCLOBENZAPRINE HCL 10 MG PO TABS
5.0000 mg | ORAL_TABLET | Freq: Three times a day (TID) | ORAL | Status: DC | PRN
  Administered 2020-04-05 (×2): 5 mg via ORAL
  Filled 2020-04-05 (×2): qty 1

## 2020-04-05 NOTE — Lactation Note (Signed)
This note was copied from a baby's chart. Lactation Consultation Note  Patient Name: Molly Savage [id number] Date: 04/05/2020 Reason for consult: Follow-up assessment Age:32 hours  Infant in NBN during visit. Mom declines to pump at this time because of sleepiness. She is aware that pumping q3 is recommended. LC offered to assist. Relayed to RN.  Feeding Mother's Current Feeding Choice: Formula Nipple Type: Slow - flow   Consult Status Consult Status: Follow-up Follow-up type: In-patient  Elder Negus, MA IBCLC 04/05/2020, 9:34 AM

## 2020-04-05 NOTE — Progress Notes (Signed)
Faculty Attending Note  Post Op Day 3  Subjective: Patient is feeling some back pain, has not been improved by tylenol, motrin or oxycodone. Using heat pad with minimal relief. She is ambulating and denies light-headedness or dizziness. She is voiding spontaneously. She is not passing flatus, has not had a BM yet. She is tolerating a regular diet without nausea/vomiting. Bleeding is moderate. She is ready for magnesium to be done. She is breast & bottle feeding. Baby is in NICU and doing well, will need treatment for jaundice.  Objective: Blood pressure 139/78, pulse (!) 101, temperature (!) 97.5 F (36.4 C), temperature source Oral, resp. rate 20, height 5' 8.5" (1.74 m), weight (!) 136.9 kg, last menstrual period 07/17/2019, SpO2 97 %, unknown if currently breastfeeding. Temp:  [97.5 F (36.4 C)-98.7 F (37.1 C)] 97.5 F (36.4 C) (03/16 0745) Pulse Rate:  [76-101] 101 (03/16 1049) Resp:  [16-20] 20 (03/16 0745) BP: (123-163)/(64-87) 139/78 (03/16 1049) SpO2:  [94 %-100 %] 97 % (03/16 0745) Weight:  [136.9 kg] 136.9 kg (03/15 1841)  Physical Exam:  General: alert, oriented, cooperative Chest: normal respiratory effort Heart: RRR  Abdomen:  soft, appropriately tender to palpation, incision covered by Prevena dressing with no evidence of active bleeding  Uterine Fundus: unable to palpate 2/2 habitus Lochia: moderate, rubra DVT Evaluation: no evidence of DVT Extremities: no edema, no calf tenderness  UOP: voiding spontaneously  Recent Labs    04/04/20 0757 04/05/20 0539  HGB 7.9* 7.8*  HCT 24.4* 23.9*    Assessment/Plan: Patient Active Problem List   Diagnosis Date Noted  . History of cesarean section 04/02/2020  . PPH (postpartum hemorrhage) 04/02/2020  . Anemia affecting pregnancy in third trimester 02/09/2020  . Preeclampsia 01/27/2020  . Previous cesarean delivery x 1, antepartum 10/27/2019  . Supervision of high risk pregnancy, antepartum 09/22/2019  . Preexisting  diabetes complicating pregnancy, antepartum 09/22/2019  . Chronic hypertension with superimposed preeclampsia 09/22/2019  . Gastroesophageal reflux disease 11/25/2018  . Allergies 11/12/2017  . Increased ammonia level 09/13/2017  . 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 Poor memory 07/10/2017  . Skin lesion of left leg 06/18/2017  . Patellofemoral arthralgia of both knees 05/21/2017  . Left foot pain 05/21/2017  . Callus of foot 04/24/2017  . Morbid obesity (HCC) 01/30/2017  . Lactose intolerance 12/26/2015  . BMI 40.0-44.9, adult (HCC) 09/18/2015  . Situational mixed anxiety and depressive disorder 07/30/2015  . Depression 07/30/2015  . Diabetic foot infection (HCC) 04/17/2015  . 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 Tobacco abuse   . Chronic hypertension 08/24/2014  . Type 1 diabetes mellitus with hyperglycemia (HCC) 05/25/2014  . Type 1 diabetes mellitus with diabetic polyneuropathy (HCC) 05/25/2014    Patient is 30 y.o. [id number] POD#3 s/p RCS at [redacted]w[redacted]d for chronic hypertension with superimposed severe pre-eclampsia, started on magnesium therapy last pm. Switched to labetalol and nifedipine last pm. Also T1DM on insulin pump and anemia.   She is doing well, recovering appropriately and complains only of back pain. Ready for magnesium to be off. About to run out of insulin, have called pharmacy and they will get her pump refilled. Reports her CBGs are pretty well controlled. In agreement with plan of care.   Flexeril for back pain Cont insulin pump Cont labetalol 300 mg BID, nifedipine 30 mg XL BID venofer Continue routine post partum care Pain meds prn Regular diet Lovenox 40 mg daily [place] unsure for birth control Plan for discharge possibly tomorrow  Baldemar Lenis, MD, [facility] Attending Center for [employer] (Faculty Practice)  04/05/2020, 11:40 AM

## 2020-04-05 NOTE — Plan of Care (Signed)
  Problem: Clinical Measurements: Goal: Ability to maintain clinical measurements within normal limits will improve Outcome: Progressing   Problem: Coping: Goal: Level of anxiety will decrease Outcome: Progressing   Problem: Pain Managment: Goal: General experience of comfort will improve Outcome: Progressing   Problem: Skin Integrity: Goal: Risk for impaired skin integrity will decrease Outcome: Progressing

## 2020-04-06 ENCOUNTER — Other Ambulatory Visit: Payer: Self-pay | Admitting: Obstetrics & Gynecology

## 2020-04-06 ENCOUNTER — Encounter (HOSPITAL_COMMUNITY): Payer: Self-pay | Admitting: Family Medicine

## 2020-04-06 MED ORDER — OXYCODONE HCL 5 MG PO TABS: 5.0000 mg | ORAL_TABLET | Freq: Four times a day (QID) | ORAL | 0 refills | Status: AC | PRN

## 2020-04-06 MED ORDER — DOCUSATE SODIUM 100 MG PO CAPS: 100.0000 mg | ORAL_CAPSULE | Freq: Two times a day (BID) | ORAL | 0 refills | Status: DC

## 2020-04-06 MED ORDER — LABETALOL HCL 300 MG PO TABS: 300.0000 mg | ORAL_TABLET | Freq: Two times a day (BID) | ORAL | 3 refills | Status: DC

## 2020-04-06 MED ORDER — NIFEDIPINE ER OSMOTIC RELEASE 30 MG PO TB24
60.0000 mg | ORAL_TABLET | Freq: Two times a day (BID) | ORAL | Status: DC
  Administered 2020-04-06: 60 mg via ORAL
  Filled 2020-04-06: qty 2

## 2020-04-06 MED ORDER — NIFEDIPINE ER 60 MG PO TB24: 60.0000 mg | ORAL_TABLET | Freq: Two times a day (BID) | ORAL | 2 refills | Status: DC

## 2020-04-06 NOTE — Plan of Care (Signed)
  Problem: Education: Goal: Knowledge of General Education information will improve Description: Including pain rating scale, medication(s)/side effects and non-pharmacologic comfort measures Outcome: Adequate for Discharge   Problem: Health Behavior/Discharge Planning: Goal: Ability to manage health-related needs will improve Outcome: Adequate for Discharge   Problem: Clinical Measurements: Goal: Ability to maintain clinical measurements within normal limits will improve Outcome: Adequate for Discharge   Problem: Coping: Goal: Level of anxiety will decrease Outcome: Adequate for Discharge   Problem: Elimination: Goal: Will not experience complications related to bowel motility Outcome: Adequate for Discharge   Problem: Pain Managment: Goal: General experience of comfort will improve Outcome: Adequate for Discharge   Problem: Skin Integrity: Goal: Risk for impaired skin integrity will decrease Outcome: Adequate for Discharge

## 2020-04-06 NOTE — Progress Notes (Signed)
Reviewed discharge postpartum instructions with patient regarding medications, when to call MD/go to MAU, signs and symptoms of pre-e, C-section site care, and to call schedule 1 week BP appointment and 4-6 week postpartum check with OB. Patient verbalized understanding of discharge postpartum instructions and asked appropriate questions.

## 2020-04-06 NOTE — Discharge Instructions (Signed)
-take tylenol 1000 mg every 6 hours as needed for pain, alternate with ibuprofen 600 mg every 6 hours -take oxycodone as needed if tylenol and ibuprofen aren't working -drink plenty of water to help with breastfeeding -continue prenatal vitamins while you are breastfeeding -take iron pills every other day with vitamin c, this will help healing as well as breast feeding -think about birth control options-->bedisider.org is a great website! You can get any form of birth control from the health department for free   Postpartum Care After Cesarean Delivery This sheet gives you information about how to care for yourself from the time you deliver your baby to up to 6-12 weeks after delivery (postpartum period). Your health care provider may also give you more specific instructions. If you have problems or questions, contact your health care provider. Follow these instructions at home: Medicines  Take over-the-counter and prescription medicines only as told by your health care provider.  If you were prescribed an antibiotic medicine, take it as told by your health care provider. Do not stop taking the antibiotic even if you start to feel better.  Ask your health care provider if the medicine prescribed to you: ? Requires you to avoid driving or using heavy machinery. ? Can cause constipation. You may need to take actions to prevent or treat constipation, such as:  Drink enough fluid to keep your urine pale yellow.  Take over-the-counter or prescription medicines.  Eat foods that are high in fiber, such as beans, whole grains, and fresh fruits and vegetables.  Limit foods that are high in fat and processed sugars, such as fried or sweet foods. Activity  Gradually return to your normal activities as told by your health care provider.  Avoid activities that take a lot of effort and energy (are strenuous) until approved by your health care provider. Walking at a slow to moderate pace is usually  safe. Ask your health care provider what activities are safe for you. ? Do not lift anything that is heavier than your baby or 10 lb (4.5 kg) as told by your health care provider. ? Do not vacuum, climb stairs, or drive a car for as long as told by your health care provider.  If possible, have someone help you at home until you are able to do your usual activities yourself.  Rest as much as possible. Try to rest or take naps while your baby is sleeping. Vaginal bleeding  It is normal to have vaginal bleeding (lochia) after delivery. Wear a sanitary pad to absorb vaginal bleeding and discharge. ? During the first week after delivery, the amount and appearance of lochia is often similar to a menstrual period. ? Over the next few weeks, it will gradually decrease to a dry, yellow-brown discharge. ? For most women, lochia stops completely by 4-6 weeks after delivery. Vaginal bleeding can vary from woman to woman.  Change your sanitary pads frequently. Watch for any changes in your flow, such as: ? A sudden increase in volume. ? A change in color. ? Large blood clots.  If you pass a blood clot, save it and call your health care provider to discuss. Do not flush blood clots down the toilet before you get instructions from your health care provider.  Do not use tampons or douches until your health care provider says this is safe.  If you are not breastfeeding, your period should return 6-8 weeks after delivery. If you are breastfeeding, your period may return anytime between 8   weeks after delivery and the time that you stop breastfeeding. Perineal care  If your C-section (Cesarean section) was unplanned, and you were allowed to labor and push before delivery, you may have pain, swelling, and discomfort of the tissue between your vaginal opening and your anus (perineum). You may also have an incision in the tissue (episiotomy) or the tissue may have torn during delivery. Follow these instructions  as told by your health care provider: ? Keep your perineum clean and dry as told by your health care provider. Use medicated pads and pain-relieving sprays and creams as directed. ? If you have an episiotomy or vaginal tear, check the area every day for signs of infection. Check for:  Redness, swelling, or pain.  Fluid or blood.  Warmth.  Pus or a bad smell. ? You may be given a squirt bottle to use instead of wiping to clean the perineum area after you go to the bathroom. As you start healing, you may use the squirt bottle before wiping yourself. Make sure to wipe gently. ? To relieve pain caused by an episiotomy, vaginal tear, or hemorrhoids, try taking a warm sitz bath 2-3 times a day. A sitz bath is a warm water bath that is taken while you are sitting down. The water should only come up to your hips and should cover your buttocks.   Breast care  Within the first few days after delivery, your breasts may feel heavy, full, and uncomfortable (breast engorgement). You may also have milk leaking from your breasts. Your health care provider can suggest ways to help relieve breast discomfort. Breast engorgement should go away within a few days.  If you are breastfeeding: ? Wear a bra that supports your breasts and fits you well. ? Keep your nipples clean and dry. Apply creams and ointments as told by your health care provider. ? You may need to use breast pads to absorb milk leakage. ? You may have uterine contractions every time you breastfeed for several weeks after delivery. Uterine contractions help your uterus return to its normal size. ? If you have any problems with breastfeeding, work with your health care provider or a lactation consultant.  If you are not breastfeeding: ? Avoid touching your breasts as this can make your breasts produce more milk. ? Wear a well-fitting bra and use cold packs to help with swelling. ? Do not squeeze out (express) milk. This causes you to make more  milk. Intimacy and sexuality  Ask your health care provider when you can engage in sexual activity. This may depend on your: ? Risk of infection. ? Healing rate. ? Comfort and desire to engage in sexual activity.  You are able to get pregnant after delivery, even if you have not had your period. If desired, talk with your health care provider about methods of family planning or birth control (contraception). Lifestyle  Do not use any products that contain nicotine or tobacco, such as cigarettes, e-cigarettes, and chewing tobacco. If you need help quitting, ask your health care provider.  Do not drink alcohol, especially if you are breastfeeding. Eating and drinking  Drink enough fluid to keep your urine pale yellow.  Eat high-fiber foods every day. These may help prevent or relieve constipation. High-fiber foods include: ? Whole grain cereals and breads. ? Brown rice. ? Beans. ? Fresh fruits and vegetables.  Take your prenatal vitamins until your postpartum checkup or until your health care provider tells you it is okay to stop.     General instructions  Keep all follow-up visits for you and your baby as told by your health care provider. Most women visit their health care provider for a postpartum checkup within the first 3-6 weeks after delivery. Contact a health care provider if you:  Feel unable to cope with the changes that a new baby brings to your life, and these feelings do not go away.  Feel unusually sad or worried.  Have breasts that are painful, hard, or turn red.  Have a fever.  Have trouble holding urine or keeping urine from leaking.  Have little or no interest in activities you used to enjoy.  Have not breastfed at all and you have not had a menstrual period for 12 weeks after delivery.  Have stopped breastfeeding and you have not had a menstrual period for 12 weeks after you stopped breastfeeding.  Have questions about caring for yourself or your  baby.  Pass a blood clot from your vagina. Get help right away if you:  Have chest pain.  Have difficulty breathing.  Have sudden, severe leg pain.  Have severe pain or cramping in your abdomen.  Bleed from your vagina so much that you fill more than one sanitary pad in one hour. Bleeding should not be heavier than your heaviest period.  Develop a severe headache.  Faint.  Have blurred vision or spots in your vision.  Have a bad-smelling vaginal discharge.  Have thoughts about hurting yourself or your baby. If you ever feel like you may hurt yourself or others, or have thoughts about taking your own life, get help right away. You can go to your nearest emergency department or call:  Your local emergency services (911 in the [place]  A suicide crisis helpline, such as the National Suicide Prevention Lifeline at 1-800-273-8255. This is open 24 hours a day. Summary  The period of time from when you deliver your baby to up to 6-12 weeks after delivery is called the postpartum period.  Gradually return to your normal activities as told by your health care provider.  Keep all follow-up visits for you and your baby as told by your health care provider. This information is not intended to replace advice given to you by your health care provider. Make sure you discuss any questions you have with your health care provider. Document Revised: 08/27/2017 Document Reviewed: 08/27/2017 Elsevier Patient Education  2021 Elsevier Inc.  

## 2020-04-06 NOTE — Lactation Note (Signed)
This note was copied from a baby's chart. Lactation Consultation Note  Patient Name: Molly Savage DUKGU'R Date: 04/06/2020 Reason for consult: Follow-up assessment;Maternal endocrine disorder Age:32 days  P2, Baby is primarily being formula fed.  Mother states she hopes to be discharged today and would like to pump once home.  Since no D/C order in LC set up DEBP.  Mother states she does not need review. Provided cleaning supplies and recommend pumping q 3 hours for a minimum of 8 times per day if she desires to provide infant with breastmilk.   Maternal Data  DM1  Feeding Mother's Current Feeding Choice: Breast Milk and Formula   Interventions Interventions: Education;DEBP  Discharge Discharge Education: Engorgement and breast care;Warning signs for feeding baby Pump: [facility] WIC Program: Yes  Consult Status Consult Status: Follow-up Date: 04/07/20 Follow-up type: In-patient    Molly Savage [facility] 04/06/2020, 11:07 AM

## 2020-04-06 NOTE — Lactation Note (Addendum)
This note was copied from a baby's chart. Lactation Consultation Note  Patient Name: Molly Savage HIDUP'B Date: 04/06/2020   Age:32 days   Request for lactation to return later today.    [name] 04/06/2020, 8:11 AM

## 2020-04-07 ENCOUNTER — Telehealth: Payer: Self-pay | Admitting: *Deleted

## 2020-04-07 NOTE — Telephone Encounter (Signed)
Transition Care Management Unsuccessful Follow-up Telephone Call  Date of discharge and from where:  04/06/2020 - [facility]  Attempts:  1st Attempt  Reason for unsuccessful TCM follow-up call:  Left voice message   Has 2 pending appointments with OBGYN

## 2020-04-10 NOTE — Telephone Encounter (Signed)
Transition Care Management Follow-up Telephone Call  Date of discharge and from where: 04/06/2020 from [facility]  How have you been since you were released from the hospital? Pt stated that she is feeling well and has no questions and concerns.   Any questions or concerns? No  Items Reviewed:  Did the pt receive and understand the discharge instructions provided? Yes   Medications obtained and verified? Yes   Other? No   Any new allergies since your discharge? No   Dietary orders reviewed? n/a  Do you have support at home? Yes   Functional Questionnaire: (I = Independent and D = Dependent) ADLs: I  Bathing/Dressing- I  Meal Prep- I  Eating- I  Maintaining continence- I  Transferring/Ambulation- I  Managing Meds- I   Follow up appointments reviewed:   PCP Hospital f/u appt confirmed? No    Specialist Hospital f/u appt confirmed? Yes  Scheduled to see 04/13/2020 on [facility] Nurse @ 01:30pm.  Are transportation arrangements needed? No   If their condition worsens, is the pt aware to call PCP or go to the Emergency Dept.? Yes  Was the patient provided with contact information for the PCP's office or ED? Yes  Was to pt encouraged to call back with questions or concerns? Yes

## 2020-04-11 ENCOUNTER — Other Ambulatory Visit: Payer: Medicaid Other

## 2020-04-11 ENCOUNTER — Ambulatory Visit: Payer: Medicaid Other

## 2020-04-13 ENCOUNTER — Other Ambulatory Visit: Payer: Self-pay

## 2020-04-13 ENCOUNTER — Ambulatory Visit (INDEPENDENT_AMBULATORY_CARE_PROVIDER_SITE_OTHER): Payer: Medicaid Other

## 2020-04-13 VITALS — BP 168/78 | HR 65 | Ht 68.5 in | Wt 281.5 lb

## 2020-04-13 DIAGNOSIS — Z4889 Encounter for other specified surgical aftercare: Secondary | ICD-10-CM

## 2020-04-13 NOTE — Progress Notes (Signed)
Agree with A & P. 

## 2020-04-13 NOTE — Progress Notes (Signed)
Pt here today for incision check and BP check. C/S was 04/02/20. Pt has wound vacuum attached and running. Denies any drainage, odor or s/s of infection.   168/78 BP. Pt advised to continue to take BP meds until PP appt on 05/18/20. Pt verbalized understanding.   Prevena wound vac removed. Incision visualized clean dry, intact. Pt given adhesive removal pads for rest of glue and given non-adhesive dressing to cover wound.   Dr Alysia Penna in to see pt. Pt to return for appt in 2 weeks. Pt agreeable. Pt to make appt at front check out today.  Judeth Cornfield RN  04/13/20.

## 2020-04-17 ENCOUNTER — Other Ambulatory Visit: Payer: Self-pay | Admitting: Gastroenterology

## 2020-04-17 DIAGNOSIS — K219 Gastro-esophageal reflux disease without esophagitis: Secondary | ICD-10-CM

## 2020-04-19 ENCOUNTER — Other Ambulatory Visit: Payer: Self-pay | Admitting: Family Medicine

## 2020-04-19 ENCOUNTER — Other Ambulatory Visit: Payer: Self-pay

## 2020-04-19 MED ORDER — LEVOCETIRIZINE DIHYDROCHLORIDE 5 MG PO TABS: 5.0000 mg | ORAL_TABLET | Freq: Every evening | ORAL | 1 refills | Status: DC

## 2020-04-19 NOTE — Telephone Encounter (Signed)
Requesting refill is not on current list.

## 2020-04-20 ENCOUNTER — Other Ambulatory Visit: Payer: Self-pay

## 2020-04-20 ENCOUNTER — Ambulatory Visit (INDEPENDENT_AMBULATORY_CARE_PROVIDER_SITE_OTHER): Payer: Medicaid Other

## 2020-04-20 VITALS — BP 167/79 | HR 70 | Ht 68.5 in | Wt 276.4 lb

## 2020-04-20 DIAGNOSIS — B372 Candidiasis of skin and nail: Secondary | ICD-10-CM | POA: Insufficient documentation

## 2020-04-20 DIAGNOSIS — Z4889 Encounter for other specified surgical aftercare: Secondary | ICD-10-CM

## 2020-04-20 DIAGNOSIS — O165 Unspecified maternal hypertension, complicating the puerperium: Secondary | ICD-10-CM

## 2020-04-20 MED ORDER — NYSTATIN 100000 UNIT/GM EX POWD: 1.0000 "application " | Freq: Two times a day (BID) | CUTANEOUS | 1 refills | Status: AC

## 2020-04-20 MED ORDER — NIFEDIPINE ER 60 MG PO TB24: 60.0000 mg | ORAL_TABLET | Freq: Every day | ORAL | 0 refills | Status: DC

## 2020-04-20 NOTE — Progress Notes (Signed)
Pt here today for post-op C/S incision check and BP check. Pt states having some moisture at incision site. "worried about a smell" .Incision noted to be closed and well approximated, but does have odor and redness for possible yeast infection.   BP today 167/79 Pt taking 300mg  of Labetalol BID.    Dr in to see pt.   Labs ordered, pt to have 1 week virtual BP check.   Vergie Living, RN  04/20/20.

## 2020-04-21 LAB — COMPREHENSIVE METABOLIC PANEL
ALT: 15 IU/L (ref 0–32)
AST: 12 IU/L (ref 0–40)
Albumin/Globulin Ratio: 1.6 (ref 1.2–2.2)
Albumin: 3.7 g/dL — ABNORMAL LOW (ref 3.8–4.8)
Alkaline Phosphatase: 118 IU/L (ref 44–121)
BUN/Creatinine Ratio: 11 (ref 9–23)
BUN: 8 mg/dL (ref 6–20)
Bilirubin Total: 0.3 mg/dL (ref 0.0–1.2)
CO2: 21 mmol/L (ref 20–29)
Calcium: 9.2 mg/dL (ref 8.7–10.2)
Chloride: 106 mmol/L (ref 96–106)
Creatinine, Ser: 0.71 mg/dL (ref 0.57–1.00)
Globulin, Total: 2.3 g/dL (ref 1.5–4.5)
Glucose: 72 mg/dL (ref 65–99)
Potassium: 4.1 mmol/L (ref 3.5–5.2)
Sodium: 142 mmol/L (ref 134–144)
Total Protein: 6 g/dL (ref 6.0–8.5)
eGFR: 117 mL/min/{1.73_m2} (ref >59)

## 2020-04-21 LAB — CBC
Hematocrit: 31.1 % — ABNORMAL LOW (ref 34.0–46.6)
Hemoglobin: 10 g/dL — ABNORMAL LOW (ref 11.1–15.9)
MCH: 26.5 pg — ABNORMAL LOW (ref 26.6–33.0)
MCHC: 32.2 g/dL (ref 31.5–35.7)
MCV: 82 fL (ref 79–97)
Platelets: 355 10*3/uL (ref 150–450)
RBC: 3.78 x10E6/uL (ref 3.77–5.28)
RDW: 16.9 % — ABNORMAL HIGH (ref 11.7–15.4)
WBC: 6.9 10*3/uL (ref 3.4–10.8)

## 2020-04-27 ENCOUNTER — Encounter: Payer: Self-pay | Admitting: Student

## 2020-04-27 ENCOUNTER — Telehealth (INDEPENDENT_AMBULATORY_CARE_PROVIDER_SITE_OTHER): Payer: Medicaid Other | Admitting: Student

## 2020-04-27 VITALS — HR 87

## 2020-04-27 DIAGNOSIS — Z0130 Encounter for examination of blood pressure without abnormal findings: Secondary | ICD-10-CM

## 2020-04-27 NOTE — Progress Notes (Signed)
Patient was assessed and managed by nursing staff during this encounter. I have reviewed the chart and agree with the documentation and plan. I have also made any necessary editorial changes.  CBC and CMP ordered. Nystatin powder bid sent in for patient  Elmo Bing, MD 04/27/2020 9:50 AM

## 2020-04-27 NOTE — Progress Notes (Addendum)
I connected with  Molly Savage on 04/27/20 at  3:15 PM EDT by telephone and verified that I am speaking with the correct person using two identifiers.   I discussed the limitations, risks, security and privacy concerns of performing an evaluation and management service by telephone and the availability of in person appointments. I also discussed with the patient that there may be a patient responsible charge related to this service. The patient expressed understanding and agreed to proceed.  Isabell Jarvis, RN 04/27/2020  3:24 PM   BP today 139/78  Pt denies any visual changes, headaches, swelling. Pt has been taking BP meds as directed. Pt has PP visit on 05/18/20. Reviewed BP with Crisoforo Oxford, CNM and Dr Vergie Living. Pt advised to continue taking BP meds until PP visit with Dr Penne Lash. Pt agreeable and verbalized understanding.   Judeth Cornfield, RN   Patient was at home and RN spoke to patient from [facility] location. Duration of call was 7 min.   Chart reviewed for nurse visit. Agree with plan of care.   Marylene Land, CNM 04/27/2020 5:04 PM

## 2020-05-18 ENCOUNTER — Ambulatory Visit: Payer: Medicaid Other | Admitting: Medical

## 2020-05-18 ENCOUNTER — Encounter: Payer: Self-pay | Admitting: Obstetrics & Gynecology

## 2020-05-18 ENCOUNTER — Telehealth (INDEPENDENT_AMBULATORY_CARE_PROVIDER_SITE_OTHER): Payer: Medicaid Other | Admitting: Obstetrics & Gynecology

## 2020-05-18 VITALS — BP 134/85 | HR 72

## 2020-05-18 DIAGNOSIS — K219 Gastro-esophageal reflux disease without esophagitis: Secondary | ICD-10-CM

## 2020-05-18 DIAGNOSIS — E10319 Type 1 diabetes mellitus with unspecified diabetic retinopathy without macular edema: Secondary | ICD-10-CM

## 2020-05-18 MED ORDER — PANTOPRAZOLE SODIUM 40 MG PO TBEC: DELAYED_RELEASE_TABLET | ORAL | 4 refills | Status: DC

## 2020-05-18 MED ORDER — LISINOPRIL 10 MG PO TABS: 10.0000 mg | ORAL_TABLET | Freq: Every day | ORAL | 1 refills | Status: DC

## 2020-05-18 NOTE — Progress Notes (Signed)
   Provider location: Center for [facility] [employer] [facility]   Patient location: Home  I connected with Molly Savage on 05/18/20 at 1126 by Mychart Video Encounter and verified that I am speaking with the correct person using two identifiers.       I discussed the limitations, risks, security and privacy concerns of performing an evaluation and management service virtually and the availability of in person appointments. I also discussed with the patient that there may be a patient responsible charge related to this service. The patient expressed understanding and agreed to proceed.  Post Partum Visit Note Subjective:   Molly Savage is a 30 y.o. G20P2002 female who presents for a postpartum visit. She is 6 weeks 4 days postpartum following a repeat cesarean section.  I have fully reviewed the prenatal and intrapartum course. The delivery was at 37w 1d.  Anesthesia: spinal. Postpartum course has been uneventful. Baby is doing well. Baby is feeding by bottle - Gerber Gentle. Bleeding on menses. Bowel function is normal. Bladder function is normal. Contraception method is none. Postpartum depression screening: negative.   The pregnancy intention screening data noted above was reviewed. Potential methods of contraception were discussed. The patient elected to proceed with IUD or IUS. She can't commit to an appt due to lack of transportation   The following portions of the patient's history were reviewed and updated as appropriate: allergies, current medications, past family history, past medical history, past social history, past surgical history and problem list.  Review of Systems Pertinent items noted in HPI and remainder of comprehensive ROS otherwise negative.  Objective:  There were no vitals taken for this visit.    General:  Alert, oriented and cooperative. Patient is in no acute distress.  Respiratory: Normal respiratory effort, no problems with respiration noted   Mental Status: Normal mood and affect. Normal behavior. Normal judgment and thought content.  Rest of physical exam deferred due to type of encounter   Assessment:    normal  postpartum exam.  Plan:  Essential components of care per ACOG recommendations:  1.  Mood and well being: Patient with negative depression screening today. Reviewed local resources for support.  - Patient does use tobacco. She does not want to quit at this time. - hx of drug use? No   2. Infant care and feeding:  -Patient currently breastmilk feeding? No  3. Sexuality, contraception and birth spacing - Patient does not want a pregnancy in the next year.  Pt wants Liletta (see above)  4. Sleep and fatigue -Encouraged family/partner/community support of 4 hrs of uninterrupted sleep to help with mood and fatigue  5. Physical Recovery  - Discussed patients delivery healing well other than yeast.  6. CHTN--d/c labetalol, nifedipine; start lisinopril 10; BP check on Monday  7.  Type 1 DM--getting Omnipod today with endocrinologist  I provided 15 minutes of face-to-face time during this encounter.  RN visit virtual [employer] for [employer], [facility]

## 2020-06-26 ENCOUNTER — Other Ambulatory Visit: Payer: Self-pay | Admitting: Obstetrics & Gynecology

## 2020-07-12 ENCOUNTER — Telehealth: Payer: Self-pay | Admitting: Lactation Services

## 2020-07-12 NOTE — Telephone Encounter (Signed)
Called patient to inform her that she needs to follow up with her Primary Care Provider. She reports she is not completely out of her prescription.   She reports she did not follow up for her BP check as she did not have a car at that time.   She has a Primary Care Provider listed and will follow up with him in regards to BP and prescription refill.

## 2020-07-27 ENCOUNTER — Other Ambulatory Visit: Payer: Self-pay

## 2020-07-27 MED ORDER — LISINOPRIL 10 MG PO TABS: 10.0000 mg | ORAL_TABLET | Freq: Every day | ORAL | 0 refills | Status: DC

## 2020-07-27 NOTE — Telephone Encounter (Signed)
Patient called stating rhe reached out to her GYN for refill on lisinopril but they were unable to fill as she is no longer pregnant. She states she knows she is overdue for cpe with PCP however she is unable to physically come in office at this time. I have scheduled her for VV med check on 7/12 with PCP. I have sent in 30 day supply for medication.   Patient declines scheduled cpe today as she starts school and does not know her schedule yet. She will call back to schedule.

## 2020-08-01 ENCOUNTER — Other Ambulatory Visit: Payer: Self-pay

## 2020-08-01 ENCOUNTER — Encounter: Payer: Self-pay | Admitting: Family Medicine

## 2020-08-01 ENCOUNTER — Telehealth (INDEPENDENT_AMBULATORY_CARE_PROVIDER_SITE_OTHER): Payer: Self-pay | Admitting: Family Medicine

## 2020-08-01 DIAGNOSIS — I10 Essential (primary) hypertension: Secondary | ICD-10-CM

## 2020-08-01 DIAGNOSIS — K219 Gastro-esophageal reflux disease without esophagitis: Secondary | ICD-10-CM

## 2020-08-01 DIAGNOSIS — Z6841 Body Mass Index (BMI) 40.0 and over, adult: Secondary | ICD-10-CM

## 2020-08-01 MED ORDER — PANTOPRAZOLE SODIUM 40 MG PO TBEC: DELAYED_RELEASE_TABLET | ORAL | 2 refills | Status: DC

## 2020-08-01 MED ORDER — LISINOPRIL 10 MG PO TABS: 10.0000 mg | ORAL_TABLET | Freq: Every day | ORAL | 2 refills | Status: DC

## 2020-08-01 NOTE — Progress Notes (Addendum)
Chief Complaint  Patient presents with   Follow-up    Medication     Subjective Malaysha Arlen is a 30 y.o. female who presents for hypertension follow up. Due to COVID-19 pandemic, we are interacting via web portal for an electronic face-to-face visit. I verified patient's ID using 2 identifiers. Patient agreed to proceed with visit via this method. Patient is at home, I am at office. Patient and I are present for visit.  She does not monitor home blood pressures. She is compliant with medication- lisinopril 10 mg/d. Patient has these side effects of medication: none She is adhering to a healthy diet overall. Current exercise: walking No Cp or SOB.   GERD Taking Protonix 40 mg/d. No AE's, compliant. Well controlled. No N/V, bleeding, unintentional wt loss.    Past Medical History:  Diagnosis Date   Anxiety    Depression    Diabetes mellitus type 1 (HCC)    Follows with Endo   GERD (gastroesophageal reflux disease)    Hypertension    Tobacco abuse    Wears glasses     Exam No conversational dyspnea Age appropriate judgment and insight Nml affect and mood  Chronic hypertension - Plan: lisinopril (PRINIVIL) 10 MG tablet  Gastroesophageal reflux disease, unspecified whether esophagitis present - Plan: pantoprazole (PROTONIX) 40 MG tablet  Morbid obesity (HCC)  Chronic, stable. Cont Lisinopril 10 mg/d. No longer interested in having kids. Counseled on diet and exercise. Chronic, stable. Cont Protonix 40 mg/d.  BMI 41.1 3 mo ago at office visit.  F/u in 6 mo for CPE. The patient voiced understanding and agreement to the plan.  Jilda Roche [place], DO 08/03/20  11:54 AM

## 2020-08-08 ENCOUNTER — Telehealth: Payer: Self-pay

## 2020-08-08 NOTE — Telephone Encounter (Signed)
Sched appt, can be virtual. Ty.

## 2020-08-08 NOTE — Telephone Encounter (Signed)
Patient called stating she is wanting to try a new medication for smoking cessation. She has tried chantix and wellbutrin in the past with no improvement. She would like to be prescribed Nicotrol. She states she smokes menthol so it would need to be included in the Rx.   She uses [facility] [address] Not sure if she needs an appointment or if something could be sent in.

## 2020-08-08 NOTE — Telephone Encounter (Signed)
Called and schedule video visit with PCP 08/09/2020.

## 2020-08-09 ENCOUNTER — Telehealth: Payer: Self-pay | Admitting: Family Medicine

## 2020-08-09 ENCOUNTER — Encounter: Payer: Self-pay | Admitting: Family Medicine

## 2020-08-09 ENCOUNTER — Telehealth (INDEPENDENT_AMBULATORY_CARE_PROVIDER_SITE_OTHER): Payer: Medicaid Other | Admitting: Family Medicine

## 2020-08-09 DIAGNOSIS — Z720 Tobacco use: Secondary | ICD-10-CM

## 2020-08-09 MED ORDER — NICOTINE 10 MG IN INHA: 1.0000 | RESPIRATORY_TRACT | 1 refills | Status: DC | PRN

## 2020-08-09 MED ORDER — BUPROPION HCL ER (SMOKING DET) 150 MG PO TB12: ORAL_TABLET | ORAL | 2 refills | Status: DC

## 2020-08-09 NOTE — Telephone Encounter (Signed)
Have not received anything from the pharmacy yet and have checked Cover my meds as well.

## 2020-08-09 NOTE — Telephone Encounter (Signed)
Patient was told that a prior authorization is need it for nicotine (NICOTROL) 10 MG inhaler

## 2020-08-09 NOTE — Progress Notes (Signed)
Chief Complaint  Patient presents with   Nicotine Dependence    Subjective: Patient is a 30 y.o. female here for smoking cessation aid. Due to COVID-19 pandemic, we are interacting via web portal for an electronic face-to-face visit. I verified patient's ID using 2 identifiers. Patient agreed to proceed with visit via this method. Patient is at home, I am at office. Patient and I are present for visit.   Pt interested in quitting smoking. She is smoking just over a pack a day. Her first cigarette is within 10 min of waking.  She has failed Chantix which affected her mood. Wellbutrin was not effective though did help reduce her appetite.   Past Medical History:  Diagnosis Date   Anxiety    Depression    Diabetes mellitus type 1 (HCC)    Follows with Endo   GERD (gastroesophageal reflux disease)    Hypertension    Tobacco abuse    Wears glasses     Objective: No conversational dyspnea Age appropriate judgment and insight Nml affect and mood  Assessment and Plan: Tobacco abuse - Plan: nicotine (NICOTROL) 10 MG inhaler, buPROPion (ZYBAN) 150 MG 12 hr tablet  Chronic, uncontrolled. Add nicotine replacement as above. Add Zyban for dual therapy as this has been shown to improve success rates. She has failed Chantix and Zyban as solo tx. F/u in 6 weeks to reck.  The patient voiced understanding and agreement to the plan.  Jilda Roche [place], DO 08/09/20  10:25 AM

## 2020-08-09 NOTE — Telephone Encounter (Signed)
Attempted to initiate PA, but unable to download documents. Called the [place] [facility] number [phone] and unable to get through on the phone

## 2020-08-09 NOTE — Telephone Encounter (Signed)
Covermymeds stated by phone this PA is initiated by phone by calling [place] [facility] at [phone]. Unable to get through as get transferred to different departments.  Had patients in the office to be seen that had to take care of and unable to call back at this time.

## 2020-08-10 ENCOUNTER — Telehealth: Payer: Self-pay | Admitting: Family Medicine

## 2020-08-11 ENCOUNTER — Telehealth: Payer: Self-pay | Admitting: *Deleted

## 2020-08-11 NOTE — Telephone Encounter (Signed)
We cannot get this approved due to her insurance. You can send in either patches, gum and lozenges for her as alternative

## 2020-08-11 NOTE — Telephone Encounter (Signed)
Called left message to call back 

## 2020-08-11 NOTE — Telephone Encounter (Signed)
Patient would like to speak to you again.

## 2020-08-11 NOTE — Telephone Encounter (Signed)
Patient informed PA has been approved

## 2020-08-11 NOTE — Telephone Encounter (Signed)
The patient wanted our office to know she has tried the patches and gum and did not work

## 2020-08-11 NOTE — Telephone Encounter (Signed)
Plz ask pt which she prefers of those 3 and will send. Ty.

## 2020-08-11 NOTE — Telephone Encounter (Signed)
Prior auth approved for Nicotrol Inhaler from 08/11/20-08/06/2021  RC#[id number] Ref# [id number]

## 2020-08-11 NOTE — Telephone Encounter (Signed)
PA has been approved for the [facility] for this patient Prior auth approved for Nicotrol Inhaler from 08/11/20-08/06/2021   XM#[id number] Ref# [id number] Approved by Thelma Barge CMA

## 2020-08-11 NOTE — Telephone Encounter (Signed)
Called the patient informed of response from PA. The patient stated that a family member had gotten this approve through medicaid and that she really needs this.  She is going to call medicaid herself to see what she can do. She did not want to settle for something else at this time.

## 2020-08-30 ENCOUNTER — Other Ambulatory Visit: Payer: Self-pay

## 2020-08-30 ENCOUNTER — Ambulatory Visit (INDEPENDENT_AMBULATORY_CARE_PROVIDER_SITE_OTHER): Payer: Self-pay

## 2020-08-30 DIAGNOSIS — Z111 Encounter for screening for respiratory tuberculosis: Secondary | ICD-10-CM

## 2020-08-30 NOTE — Progress Notes (Signed)
Pt here for a TB skin test for employment.  Test placed on left forearm. Pt will come back to have it read during office visit on Friday with Dr. Carmelia Roller.

## 2020-09-01 ENCOUNTER — Ambulatory Visit: Payer: Medicaid Other | Admitting: Family Medicine

## 2020-09-01 LAB — TB SKIN TEST
Induration: 0 mm
TB Skin Test: NEGATIVE

## 2020-11-13 ENCOUNTER — Encounter: Payer: Self-pay | Admitting: Family Medicine

## 2020-11-13 ENCOUNTER — Ambulatory Visit (INDEPENDENT_AMBULATORY_CARE_PROVIDER_SITE_OTHER): Payer: Medicaid Other | Admitting: Family Medicine

## 2020-11-13 ENCOUNTER — Other Ambulatory Visit: Payer: Self-pay

## 2020-11-13 VITALS — BP 120/86 | HR 79 | Temp 97.9°F | Ht 68.5 in | Wt 258.4 lb

## 2020-11-13 DIAGNOSIS — Z23 Encounter for immunization: Secondary | ICD-10-CM

## 2020-11-13 DIAGNOSIS — J302 Other seasonal allergic rhinitis: Secondary | ICD-10-CM

## 2020-11-13 DIAGNOSIS — G8929 Other chronic pain: Secondary | ICD-10-CM

## 2020-11-13 DIAGNOSIS — R6882 Decreased libido: Secondary | ICD-10-CM

## 2020-11-13 DIAGNOSIS — R519 Headache, unspecified: Secondary | ICD-10-CM | POA: Diagnosis not present

## 2020-11-13 MED ORDER — FLUTICASONE PROPIONATE 50 MCG/ACT NA SUSP: 2.0000 | Freq: Every day | NASAL | 2 refills | Status: DC

## 2020-11-13 MED ORDER — SUMATRIPTAN SUCCINATE 100 MG PO TABS: 100.0000 mg | ORAL_TABLET | ORAL | 0 refills | Status: DC | PRN

## 2020-11-13 NOTE — Patient Instructions (Signed)
Give [place] 2-3 business days to get the results of your labs back.   Keep the diet clean and stay active.  Start Flonase.   Claritin (loratadine), Allegra (fexofenadine), Zyrtec (cetirizine) which is also equivalent to Xyzal (levocetirizine); these are listed in order from weakest to strongest. Generic, and therefore cheaper, options are in the parentheses.   Flonase (fluticasone); nasal spray that is over the counter. 2 sprays each nostril, once daily. Aim towards the same side eye when you spray.  There are available OTC, and the generic versions, which may be cheaper, are in parentheses. Show this to a pharmacist if you have trouble finding any of these items.  Let us know if you need anything.

## 2020-11-13 NOTE — Progress Notes (Signed)
Chief Complaint  Patient presents with   Follow-up    Hormones     Subjective: Patient is a 30 y.o. female here for f/u.  2 yrs has not had a good libido. She has 2 kids. Energy is fair. Cycles have been irregular since having 2nd child being harder to time and sometimes heavier than expected.   Patient has nasal congestion during certain times of the year.  Oral medications seem to be less helpful.  She has never used a nasal spray.  No fevers, shortness of breath, or wheezing.  Over past mo, has been daily headaches.  Pain is on the top of her head bilaterally.  No neurologic signs or symptoms.  Does not seem to be associated with her congestion.  Past Medical History:  Diagnosis Date   Anxiety    Depression    Diabetes mellitus type 1 (HCC)    Follows with Endo   GERD (gastroesophageal reflux disease)    Hypertension    Tobacco abuse    Wears glasses     Objective: BP 120/86   Pulse 79   Temp 97.9 F (36.6 C) (Oral)   Ht 5' 8.5" (1.74 m)   Wt 258 lb 6 oz (117.2 kg)   SpO2 97%   BMI 38.71 kg/m  General: Awake, appears stated age Abd: BS+, S, NT, ND Heart: RRR, no LE edema Neck: Symmetric, supple, no thyromegaly Lungs: CTAB, no rales, wheezes or rhonchi. No accessory muscle use Psych: Age appropriate judgment and insight, normal affect and mood  Assessment and Plan: Low libido - Plan: TSH, T4, free, CBC, Comprehensive metabolic panel, VITAMIN D 25 Hydroxy (Vit-D Deficiency, Fractures)  Seasonal allergies  Chronic nonintractable headache, unspecified headache type  Need for influenza vaccination - Plan: Flu Vaccine QUAD 6+ mos PF IM (Fluarix Quad PF)  Ck labs. If neg, would consider OCP given irreg cycles. Consider sex therapy.  [facility] rec'd.  Imitrex trial. Not attempting to get pregnant at this time. F/u in 1 mo. The patient voiced understanding and agreement to the plan.  Jilda Roche [place], DO 11/13/20  2:01 PM

## 2020-11-16 ENCOUNTER — Other Ambulatory Visit: Payer: Self-pay | Admitting: Family Medicine

## 2020-11-16 ENCOUNTER — Other Ambulatory Visit (INDEPENDENT_AMBULATORY_CARE_PROVIDER_SITE_OTHER): Payer: Medicaid Other

## 2020-11-16 ENCOUNTER — Other Ambulatory Visit: Payer: Self-pay

## 2020-11-16 DIAGNOSIS — R6882 Decreased libido: Secondary | ICD-10-CM | POA: Diagnosis not present

## 2020-11-16 LAB — VITAMIN D 25 HYDROXY (VIT D DEFICIENCY, FRACTURES): VITD: 11.39 ng/mL — ABNORMAL LOW (ref 30.00–100.00)

## 2020-11-16 LAB — COMPREHENSIVE METABOLIC PANEL
ALT: 12 U/L (ref 0–35)
AST: 12 U/L (ref 0–37)
Albumin: 3.8 g/dL (ref 3.5–5.2)
Alkaline Phosphatase: 102 U/L (ref 39–117)
BUN: 13 mg/dL (ref 6–23)
CO2: 26 mEq/L (ref 19–32)
Calcium: 9.4 mg/dL (ref 8.4–10.5)
Chloride: 105 mEq/L (ref 96–112)
Creatinine, Ser: 0.69 mg/dL (ref 0.40–1.20)
GFR: 115.07 mL/min (ref >60.00)
Glucose, Bld: 217 mg/dL — ABNORMAL HIGH (ref 70–99)
Potassium: 4.8 mEq/L (ref 3.5–5.1)
Sodium: 136 mEq/L (ref 135–145)
Total Bilirubin: 0.3 mg/dL (ref 0.2–1.2)
Total Protein: 6.5 g/dL (ref 6.0–8.3)

## 2020-11-16 LAB — CBC
HCT: 39.3 % (ref 36.0–46.0)
Hemoglobin: 12.7 g/dL (ref 12.0–15.0)
MCHC: 32.4 g/dL (ref 30.0–36.0)
MCV: 84.4 fl (ref 78.0–100.0)
Platelets: 231 10*3/uL (ref 150.0–400.0)
RBC: 4.66 Mil/uL (ref 3.87–5.11)
RDW: 15.1 % (ref 11.5–15.5)
WBC: 13.6 10*3/uL — ABNORMAL HIGH (ref 4.0–10.5)

## 2020-11-16 LAB — TSH: TSH: 1.66 u[IU]/mL (ref 0.35–5.50)

## 2020-11-16 LAB — T4, FREE: Free T4: 0.87 ng/dL (ref 0.60–1.60)

## 2020-11-16 MED ORDER — VITAMIN D (ERGOCALCIFEROL) 1.25 MG (50000 UNIT) PO CAPS: 50000.0000 [IU] | ORAL_CAPSULE | ORAL | 0 refills | Status: DC

## 2020-12-13 ENCOUNTER — Other Ambulatory Visit: Payer: Self-pay | Admitting: Family Medicine

## 2020-12-26 ENCOUNTER — Encounter: Payer: Self-pay | Admitting: Family Medicine

## 2020-12-26 ENCOUNTER — Ambulatory Visit (INDEPENDENT_AMBULATORY_CARE_PROVIDER_SITE_OTHER): Payer: Medicaid Other | Admitting: Family Medicine

## 2020-12-26 VITALS — BP 122/72 | HR 102 | Temp 98.1°F | Ht 68.5 in | Wt 257.5 lb

## 2020-12-26 DIAGNOSIS — J302 Other seasonal allergic rhinitis: Secondary | ICD-10-CM | POA: Diagnosis not present

## 2020-12-26 DIAGNOSIS — I7300 Raynaud's syndrome without gangrene: Secondary | ICD-10-CM

## 2020-12-26 MED ORDER — NIFEDIPINE ER OSMOTIC RELEASE 30 MG PO TB24: 30.0000 mg | ORAL_TABLET | Freq: Every day | ORAL | 2 refills | Status: DC

## 2020-12-26 MED ORDER — CETIRIZINE HCL 10 MG PO TABS: 10.0000 mg | ORAL_TABLET | Freq: Every day | ORAL | 11 refills | Status: DC

## 2020-12-26 MED ORDER — MONTELUKAST SODIUM 10 MG PO TABS: 10.0000 mg | ORAL_TABLET | Freq: Every day | ORAL | 3 refills | Status: DC

## 2020-12-26 NOTE — Progress Notes (Signed)
Chief Complaint  Patient presents with   Follow-up    Circulation Allergies     Subjective: Patient is a 30 y.o. female here for circulation issues.  For several years, the patient has had decreased sensation and blue fingers/toes when exposed to cold. All of her digits are affected equally. She will slowly regain function once she gets back into warmer environments. She has never been on any medication for this. No famhx that she is aware of. Has never been dx'd w Raynaud's.   Patient has a history of seasonal allergies.  She was taking Xyzal 5 mg daily but it stopped working.  She was placed on Flonase which she did not like the feeling of something on her nose.  She is still having symptoms and wondering what she should use.  She will sometimes have a cough when she has a rapid change of environment such as working outside and going inside.  No wheezing, shortness of breath, or fevers.  Past Medical History:  Diagnosis Date   Anxiety    Depression    Diabetes mellitus type 1 (HCC)    Follows with Endo   GERD (gastroesophageal reflux disease)    Hypertension    Tobacco abuse    Wears glasses     Objective: BP 122/72   Pulse (!) 102   Temp 98.1 F (36.7 C) (Oral)   Ht 5' 8.5" (1.74 m)   Wt 257 lb 8 oz (116.8 kg)   SpO2 99%   BMI 38.58 kg/m  General: Awake, appears stated age Heart: RRR, no LE edema HEENT: Nares patent, no rhinorrhea, ears negative bilaterally, MMM Lungs: CTAB, no rales, wheezes or rhonchi. No accessory muscle use Psych: Age appropriate judgment and insight, normal affect and mood  Assessment and Plan: Raynaud's phenomenon without gangrene - Plan: NIFEdipine (PROCARDIA-XL/NIFEDICAL-XL) 30 MG 24 hr tablet  Seasonal allergies - Plan: cetirizine (ZYRTEC) 10 MG tablet, montelukast (SINGULAIR) 10 MG tablet  Chronic issue that is not controlled.  Start nifedipine 30 mg daily.  Follow-up in 1 month. Chronic, uncontrolled.  Change Xyzal to cetirizine 10 mg  daily.  Stop nasal sprays as she does not like them/tolerate them.  Start Singulair daily as needed She brought up 2 more issues regarding libido in her foot.  I told her she needs to follow-up with her gynecologist and podiatrist respectively. The patient voiced understanding and agreement to the plan.  Jilda Roche [place], DO 12/26/20  4:46 PM

## 2020-12-26 NOTE — Patient Instructions (Addendum)
Call Center for [facility] at [phone] for an appointment.  They are located at [address], [place], [place], [address] (right across the hall from our office).  Use triple antibiotic ointment twice daily over the nose.   Keep hands warm.   Claritin (loratadine), Allegra (fexofenadine), Zyrtec (cetirizine) which is also equivalent to Xyzal (levocetirizine); these are listed in order from weakest to strongest. Generic, and therefore cheaper, options are in the parentheses.   There are available OTC, and the generic versions, which may be cheaper, are in parentheses. Show this to a pharmacist if you have trouble finding any of these items.  Let us know if you need anything.

## 2021-01-01 ENCOUNTER — Telehealth: Payer: Self-pay | Admitting: Family Medicine

## 2021-01-01 DIAGNOSIS — Z720 Tobacco use: Secondary | ICD-10-CM

## 2021-01-01 MED ORDER — NICOTINE 10 MG IN INHA: 1.0000 | RESPIRATORY_TRACT | 1 refills | Status: DC | PRN

## 2021-01-01 NOTE — Telephone Encounter (Signed)
Medication: nicotine (NICOTROL) 10 MG inhaler [[id number]]   Has the patient contacted their pharmacy? Yes.     Preferred Pharmacy: [facility] [address], [place] - [address]  [address] [place], [place] [address]  Phone:  [phone]  Fax:  [phone]

## 2021-01-05 ENCOUNTER — Telehealth: Payer: Medicaid Other | Admitting: Family Medicine

## 2021-01-09 ENCOUNTER — Encounter: Payer: Self-pay | Admitting: Family Medicine

## 2021-01-09 ENCOUNTER — Telehealth (INDEPENDENT_AMBULATORY_CARE_PROVIDER_SITE_OTHER): Payer: Medicaid Other | Admitting: Family Medicine

## 2021-01-09 DIAGNOSIS — Z124 Encounter for screening for malignant neoplasm of cervix: Secondary | ICD-10-CM

## 2021-01-09 DIAGNOSIS — Z720 Tobacco use: Secondary | ICD-10-CM | POA: Diagnosis not present

## 2021-01-09 MED ORDER — NICOTINE 7 MG/24HR TD PT24: 7.0000 mg | MEDICATED_PATCH | Freq: Every day | TRANSDERMAL | 0 refills | Status: DC

## 2021-01-09 MED ORDER — NICOTINE 21 MG/24HR TD PT24: 21.0000 mg | MEDICATED_PATCH | Freq: Every day | TRANSDERMAL | 0 refills | Status: AC

## 2021-01-09 MED ORDER — BUPROPION HCL ER (SMOKING DET) 150 MG PO TB12: ORAL_TABLET | ORAL | 2 refills | Status: DC

## 2021-01-09 MED ORDER — NICOTINE 14 MG/24HR TD PT24: 14.0000 mg | MEDICATED_PATCH | Freq: Every day | TRANSDERMAL | 0 refills | Status: AC

## 2021-01-09 NOTE — Progress Notes (Signed)
Chief Complaint  Patient presents with   would like to stop smoking    Subjective: Patient is a 30 y.o. female here for tobacco abuse f/u. Due to COVID-19 pandemic, we are interacting via web portal for an electronic face-to-face visit. I verified patient's ID using 2 identifiers. Patient agreed to proceed with visit via this method. Patient is at home, I am at office. Patient and I are present for visit.   Patient has been trying to quit smoking. She tried a nicotine inhaler and it made her want to smoke more. She is interested in the patch or gum. She smokes around 1 ppd. Has never been on Zyban. Has never been on Chantix.    Past Medical History:  Diagnosis Date   Anxiety    Depression    Diabetes mellitus type 1 (HCC)    Follows with Endo   GERD (gastroesophageal reflux disease)    Hypertension    Tobacco abuse    Wears glasses     Objective: No conversational dyspnea Age appropriate judgment and insight Nml affect and mood  Assessment and Plan: Tobacco abuse - Plan: nicotine (NICODERM CQ - DOSED IN MG/24 HR) 7 mg/24hr patch, nicotine (NICODERM CQ - DOSED IN MG/24 HOURS) 21 mg/24hr patch, nicotine (NICODERM CQ - DOSED IN MG/24 HOURS) 14 mg/24hr patch, buPROPion (ZYBAN) 150 MG 12 hr tablet  Cervical cancer screening - Plan: Ambulatory referral to Gynecology  Chronic, uncontrolled. Trial Zyban along with patches.  Refer GYN at her request.  F/u in 6 weeks to reck.  The patient voiced understanding and agreement to the plan.  Jilda Roche [place], DO 01/09/21  3:18 PM

## 2021-01-19 ENCOUNTER — Telehealth: Payer: Self-pay | Admitting: Family Medicine

## 2021-01-19 MED ORDER — VITAMIN D (ERGOCALCIFEROL) 1.25 MG (50000 UNIT) PO CAPS: 50000.0000 [IU] | ORAL_CAPSULE | ORAL | 0 refills | Status: DC

## 2021-01-19 NOTE — Telephone Encounter (Signed)
Sent in

## 2021-01-19 NOTE — Telephone Encounter (Signed)
Pt lost pills  Medication: Vitamin D, Ergocalciferol, (DRISDOL) 1.25 MG (50000 UNIT) CAPS capsule   Has the patient contacted their pharmacy? No. (If no, request that the patient contact the pharmacy for the refill.) (If yes, when and what did the pharmacy advise?)  Preferred Pharmacy (with phone number or street name):  [facility] [address], [place] - [address]  [address] [place] [address]  Phone:  [phone]  Fax:  [phone]  Agent: Please be advised that RX refills may take up to 3 business days. We ask that you follow-up with your pharmacy.

## 2021-01-22 ENCOUNTER — Other Ambulatory Visit: Payer: Self-pay

## 2021-01-22 ENCOUNTER — Encounter (HOSPITAL_BASED_OUTPATIENT_CLINIC_OR_DEPARTMENT_OTHER): Payer: Self-pay

## 2021-01-22 ENCOUNTER — Emergency Department (HOSPITAL_BASED_OUTPATIENT_CLINIC_OR_DEPARTMENT_OTHER)
Admission: EM | Admit: 2021-01-22 | Discharge: 2021-01-22 | Disposition: A | Discharge status: Home / Self Care | Payer: Medicaid Other | Attending: Emergency Medicine | Admitting: Emergency Medicine

## 2021-01-22 DIAGNOSIS — S0990XA Unspecified injury of head, initial encounter: Secondary | ICD-10-CM | POA: Diagnosis present

## 2021-01-22 DIAGNOSIS — W228XXA Striking against or struck by other objects, initial encounter: Secondary | ICD-10-CM | POA: Diagnosis not present

## 2021-01-22 DIAGNOSIS — U071 COVID-19: Secondary | ICD-10-CM | POA: Insufficient documentation

## 2021-01-22 DIAGNOSIS — R509 Fever, unspecified: Secondary | ICD-10-CM

## 2021-01-22 LAB — RESP PANEL BY RT-PCR (FLU A&B, COVID) ARPGX2
Influenza A by PCR: NEGATIVE
Influenza B by PCR: NEGATIVE
SARS Coronavirus 2 by RT PCR: POSITIVE — AB

## 2021-01-22 MED ORDER — IBUPROFEN 800 MG PO TABS
800.0000 mg | ORAL_TABLET | Freq: Once | ORAL | Status: AC
  Administered 2021-01-22: 800 mg via ORAL
  Filled 2021-01-22: qty 1

## 2021-01-22 NOTE — ED Provider Notes (Signed)
Emergency Department Provider Note   I have reviewed the triage vital signs and the nursing notes.   HISTORY  Chief Complaint Head Injury   HPI Molly Savage is a 31 y.o. female presents to the ED after a head [place] work. [place] and hit the top of her head on a heavy, hard object. No LOC. No confusion or vomiting. No laceration. She has a mild HA and has noticed chills since. She notes some cough and URI symptoms as well. No radiation of symptoms or modifying factors.    Review of Systems  Constitutional: Positive fever.  Eyes: No visual changes. ENT: No sore throat. Cardiovascular: Denies chest pain. Respiratory: Denies shortness of breath. Gastrointestinal: No abdominal pain.  No nausea, no vomiting.  No diarrhea.  No constipation. Genitourinary: Negative for dysuria. Musculoskeletal: Negative for back pain. Skin: Negative for rash. Neurological: Negative for focal weakness or numbness. Positive HA.   10-point ROS otherwise negative.  ____________________________________________   PHYSICAL EXAM:  VITAL SIGNS: ED Triage Vitals  Enc Vitals Group     BP 01/22/21 2004 (!) 155/86     Pulse Rate 01/22/21 2004 (!) 123     Resp 01/22/21 2004 20     Temp 01/22/21 2004 (!) 102.8 F (39.3 C)     Temp Source 01/22/21 2004 Oral     SpO2 01/22/21 2004 100 %     Weight 01/22/21 2004 257 lb (116.6 kg)     Height 01/22/21 2004 5\' 8"  (1.727 m)   Constitutional: Alert and oriented. Well appearing and in no acute distress. Eyes: Conjunctivae are normal. PERRL. Head: Atraumatic. No laceration or hematoma.  Nose: No congestion/rhinnorhea. Mouth/Throat: Mucous membranes are moist.   Neck: No stridor.   Cardiovascular: Normal rate, regular rhythm. Good peripheral circulation. Grossly normal heart sounds.   Respiratory: Normal respiratory effort.  No retractions. Lungs CTAB. Gastrointestinal: Soft and nontender. No  distention.  Musculoskeletal: No lower extremity tenderness nor edema. No gross deformities of extremities. Neurologic:  Normal speech and language. No gross focal neurologic deficits are appreciated.  Skin:  Skin is warm, dry and intact. No rash noted.  ____________________________________________   LABS (all labs ordered are listed, but only abnormal results are displayed)  Labs Reviewed  RESP PANEL BY RT-PCR (FLU A&B, COVID) ARPGX2 - Abnormal; Notable for the following components:      Result Value   SARS Coronavirus 2 by RT PCR POSITIVE (*)    All other components within normal limits    ____________________________________________   PROCEDURES  Procedure(s) performed:   Procedures  None ____________________________________________   INITIAL IMPRESSION / ASSESSMENT AND PLAN / ED COURSE  Pertinent labs & imaging results that were available during my care of the patient were reviewed by me and considered in my medical decision making (see chart for details).    Patient Vitals for the past 24 hrs:  BP Temp Temp src Pulse Resp SpO2 Height Weight  01/22/21 2004 (!) 155/86 (!) 102.8 F (39.3 C) Oral (!) 123 20 100 % -- --  01/22/21 2004 -- -- -- -- -- -- 5\' 8"  (1.727 m) 116.6 kg    Medical Decision Making: Summary: Patient presents with HA after injury. No outward sign of trauma. No laceration. Considered traumatic SAH, SDH, ICH but with normal exam and lack of severe symptoms, will not perform CT head. Canadian head CT rule applied.   Patient arrives with fever. URI symptoms  noted. Will send COVID and Flu PCR testing. Patient to f/u results in MyChart app. Low risk for worsening disease. Exam not consistent with PNA or meningitis.      Clinical Laboratory Tests Ordered, included  COVID/Flu PCR .   Radiologic Tests Ordered, included N/A   ____________________________________________  FINAL CLINICAL IMPRESSION(S) / ED DIAGNOSES  Final diagnoses:  Injury of head,  initial encounter  Fever, unspecified fever cause     MEDICATIONS GIVEN DURING THIS VISIT:  Medications  ibuprofen (ADVIL) tablet 800 mg (800 mg Oral Given 01/22/21 2010)    Note:  This document was prepared using Dragon voice recognition software and may include unintentional dictation errors.  Alona Bene, MD, [facility]    Ladelle Teodoro, Arlyss Repress, MD 01/23/21 1131

## 2021-01-22 NOTE — ED Triage Notes (Signed)
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

## 2021-01-22 NOTE — Discharge Instructions (Signed)
You are seen in the emergency room today after head injury.  May take Tylenol ibuprofen as needed for headache.  We also found that she had fever and some chills.  We are testing you for COVID and flu and your test results will come back in the MyChart app later this evening.  Please remain isolated from others pending these test results.  If you continue to have persistent headache, trouble focusing, nausea this could be a sign of a concussion and you will need to follow-up with your primary care doctor.  Return to the emergency department any new or suddenly worsening symptoms.

## 2021-01-22 NOTE — ED Notes (Signed)
Pt unaware she has a fever-when offered resp swab, pt states she does not have the flu or covid due to she is vaccinated for both-resp swab not done in triage at this time

## 2021-01-23 ENCOUNTER — Ambulatory Visit: Payer: Medicaid Other | Admitting: Family Medicine

## 2021-01-30 ENCOUNTER — Telehealth (INDEPENDENT_AMBULATORY_CARE_PROVIDER_SITE_OTHER): Payer: Medicaid Other | Admitting: Family Medicine

## 2021-01-30 ENCOUNTER — Encounter: Payer: Self-pay | Admitting: Family Medicine

## 2021-01-30 ENCOUNTER — Telehealth: Payer: Self-pay | Admitting: Family Medicine

## 2021-01-30 ENCOUNTER — Other Ambulatory Visit: Payer: Self-pay

## 2021-01-30 DIAGNOSIS — U071 COVID-19: Secondary | ICD-10-CM | POA: Diagnosis not present

## 2021-01-30 NOTE — Progress Notes (Signed)
Chief Complaint  Patient presents with   Covid Positive    Needs work note    Molly Savage here for URI complaints. Due to COVID-19 pandemic, we are interacting via web portal for an electronic face-to-face visit. I verified patient's ID using 2 identifiers. Patient agreed to proceed with visit via this method. Patient is at home, I am at office. Patient and I are present for visit.   Duration: Dx'd on 01/22/21 incidentally.  No longer had any s/s's. Had chills initially.  Treatment to date: NSAIDs Sick contacts: No Tested neg for covid on 01/29/21.  Past Medical History:  Diagnosis Date   Anxiety    Depression    Diabetes mellitus type 1 ([place])    Follows with Endo   GERD (gastroesophageal reflux disease)    Hypertension    Tobacco abuse    Wears glasses     Objective No conversational dyspnea Age appropriate judgment and insight Nml affect and mood  COVID-19  Improving. Cont Tylenol prn. Letter for work given.  Continue to push fluids, practice good hand hygiene, cover mouth when coughing. F/u prn. If starting to experience fevers, shaking, or shortness of breath, seek immediate care. Pt voiced understanding and agreement to the plan.  [place], DO 01/30/21 2:10 PM

## 2021-01-30 NOTE — Telephone Encounter (Signed)
Appt scheduled////letter provided

## 2021-01-30 NOTE — Telephone Encounter (Signed)
1.02- er visit  1.03- OV visit with wendling  Patient ended up testing positive with the ER for covid  Patient has been out of work since then Can we write her a work note for the days she has been out of work???(Jan 8-9) patient is covered from job the 3-7. They have a 5 day coverage.   Patient would like to go back to work by Thursday 1/12  Please advise

## 2021-01-30 NOTE — Telephone Encounter (Signed)
Sched E visit.

## 2021-05-08 ENCOUNTER — Telehealth: Payer: Self-pay | Admitting: Family Medicine

## 2021-05-08 NOTE — Telephone Encounter (Signed)
Spoke to patient and she was able to acces her test results and print them herself.  ?

## 2021-05-08 NOTE — Telephone Encounter (Signed)
Patient would like a copy of her 09/01/20 TB skin test sent to her mychart. Please advise.  ?

## 2021-05-08 NOTE — Telephone Encounter (Signed)
Have printed and can email/or she can pickup. ?Or the patient can go on mychart and look up result under labs. ?Called left message to call back ?

## 2021-06-11 ENCOUNTER — Ambulatory Visit (INDEPENDENT_AMBULATORY_CARE_PROVIDER_SITE_OTHER): Payer: Medicaid Other | Admitting: Podiatry

## 2021-06-11 ENCOUNTER — Ambulatory Visit (INDEPENDENT_AMBULATORY_CARE_PROVIDER_SITE_OTHER): Payer: Medicaid Other

## 2021-06-11 DIAGNOSIS — M2042 Other hammer toe(s) (acquired), left foot: Secondary | ICD-10-CM | POA: Diagnosis not present

## 2021-06-11 DIAGNOSIS — M2041 Other hammer toe(s) (acquired), right foot: Secondary | ICD-10-CM

## 2021-06-11 DIAGNOSIS — M79671 Pain in right foot: Secondary | ICD-10-CM

## 2021-06-11 DIAGNOSIS — M79672 Pain in left foot: Secondary | ICD-10-CM

## 2021-06-11 DIAGNOSIS — M21611 Bunion of right foot: Secondary | ICD-10-CM

## 2021-06-11 NOTE — Patient Instructions (Signed)

## 2021-06-13 NOTE — Progress Notes (Signed)
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  She is also been having discomfort on the callus on the right big toe and deviation of her second toe.  She states she had ongoing chronic foot pain on both feet that she would like to have addressed.  This is been ongoing issue.  She said the third toes have been corrected her entire life as far she can remember.  No recent injury or treatments in the last saw her.  She has been wearing orthotics but she wears them down quickly.  Objective: AAO x3, NAD DP/PT pulses palpable bilaterally, CRT less than 3 seconds On the left foot there is hammertoe deformity present in the left third, fourth, fifth toe.  There is pinch callus noted on the fourth toe.  She has tenderness when the toes rub.  On the right foot there is a bunion present.  There is transverse plane deformity of the second digit.  Hammertoes are also present.  No pain with calf compression, swelling, warmth, erythema  Assessment: Bilateral foot pain, additional deformity, right foot bunion, callus  Plan: -All treatment options discussed with the patient including all alternatives, risks, complications.  -X-rays obtained reviewed bilaterally.  3 views bilateral feet were obtained.  No evidence of acute fracture. -Regards to her feet we discussed both conservative as well as surgical treatment options again today.  Previously she was scheduled for hammertoes on the left foot in 2021 but this was canceled.  She was to proceed with surgery at this time.  Discussed with her hammertoe repair left foot third, fourth, fifth toe.  Should be left with likely K wire fixation of third and fourth toes.  I discussed with this may not limit all of her pain but hopefully getting the toe straight will help prevent them from rubbing on the callus formation on the plantar aspect of the toe.  Once the  left foot is healed if she would pursue the right foot we can do that as well. -The incision placement as well as the postoperative course was discussed with the patient. I discussed risks of the surgery which include, but not limited to, infection, bleeding, pain, swelling, need for further surgery, delayed or nonhealing, painful or ugly scar, numbness or sensation changes, over/under correction, recurrence, transfer lesions, further deformity, hardware failure, DVT/PE, loss of toe/foot. Patient understands these risks and wishes to proceed with surgery. The surgical consent was reviewed with the patient all 3 pages were signed. No promises or guarantees were given to the outcome of the procedure. All questions were answered to the best of my ability. Before the surgery the patient was encouraged to call the office if there is any further questions. The surgery will be performed at the [facility] on an outpatient basis. -As a courtesy sharply debrided the callus right foot without any complications or bleeding. -We will need A1c recheck prior to surgery.  She is scheduled for later this week. -Patient encouraged to call the office with any questions, concerns, change in symptoms.   Vivi Barrack DPM

## 2021-06-15 ENCOUNTER — Ambulatory Visit: Payer: Self-pay | Admitting: Podiatry

## 2021-06-19 ENCOUNTER — Other Ambulatory Visit: Payer: Self-pay | Admitting: Podiatry

## 2021-06-19 DIAGNOSIS — M2041 Other hammer toe(s) (acquired), right foot: Secondary | ICD-10-CM

## 2021-07-27 ENCOUNTER — Encounter: Payer: Self-pay | Admitting: Family Medicine

## 2021-07-30 ENCOUNTER — Other Ambulatory Visit: Payer: Self-pay | Admitting: Family Medicine

## 2021-07-30 DIAGNOSIS — Z720 Tobacco use: Secondary | ICD-10-CM

## 2021-07-30 MED ORDER — NICOTINE 7 MG/24HR TD PT24: 7.0000 mg | MEDICATED_PATCH | Freq: Every day | TRANSDERMAL | 0 refills | Status: DC

## 2021-07-30 MED ORDER — NICOTINE 14 MG/24HR TD PT24: 14.0000 mg | MEDICATED_PATCH | Freq: Every day | TRANSDERMAL | 0 refills | Status: AC

## 2021-07-30 MED ORDER — NICOTINE 21 MG/24HR TD PT24: 21.0000 mg | MEDICATED_PATCH | Freq: Every day | TRANSDERMAL | 0 refills | Status: AC

## 2021-08-19 ENCOUNTER — Other Ambulatory Visit: Payer: Self-pay | Admitting: Family Medicine

## 2021-08-19 DIAGNOSIS — I10 Essential (primary) hypertension: Secondary | ICD-10-CM

## 2021-08-19 DIAGNOSIS — K219 Gastro-esophageal reflux disease without esophagitis: Secondary | ICD-10-CM

## 2021-09-20 ENCOUNTER — Telehealth: Payer: Self-pay | Admitting: Podiatry

## 2021-09-20 ENCOUNTER — Encounter: Payer: Self-pay | Admitting: Podiatry

## 2021-09-20 NOTE — Telephone Encounter (Signed)
Patient called and stated that her endocrinologist (Dr. Baron Sane approving her to have sx.  If patient doesn't answer please leave a vm. She will be in training  Dr. Corwin Levins [phone]

## 2021-10-18 ENCOUNTER — Ambulatory Visit (INDEPENDENT_AMBULATORY_CARE_PROVIDER_SITE_OTHER): Payer: Medicaid Other | Admitting: Family Medicine

## 2021-10-18 ENCOUNTER — Encounter: Payer: Self-pay | Admitting: Family Medicine

## 2021-10-18 VITALS — BP 140/90 | HR 90 | Resp 18 | Ht 68.0 in | Wt 227.0 lb

## 2021-10-18 DIAGNOSIS — Z23 Encounter for immunization: Secondary | ICD-10-CM | POA: Diagnosis not present

## 2021-10-18 DIAGNOSIS — Z0000 Encounter for general adult medical examination without abnormal findings: Secondary | ICD-10-CM | POA: Diagnosis not present

## 2021-10-18 DIAGNOSIS — E1040 Type 1 diabetes mellitus with diabetic neuropathy, unspecified: Secondary | ICD-10-CM | POA: Diagnosis not present

## 2021-10-18 DIAGNOSIS — R030 Elevated blood-pressure reading, without diagnosis of hypertension: Secondary | ICD-10-CM

## 2021-10-18 DIAGNOSIS — F3181 Bipolar II disorder: Secondary | ICD-10-CM

## 2021-10-18 MED ORDER — DULOXETINE HCL 20 MG PO CPEP: 20.0000 mg | ORAL_CAPSULE | Freq: Every day | ORAL | 3 refills | Status: DC

## 2021-10-18 MED ORDER — ARIPIPRAZOLE 2 MG PO TABS: 2.0000 mg | ORAL_TABLET | Freq: Every day | ORAL | 2 refills | Status: DC

## 2021-10-18 NOTE — Patient Instructions (Addendum)
Please remember to take your blood pressure medication. Monitor BP at home on medication. If >140/90, I want to see you.   Give [place] 2-3 business days to get the results of your labs back.   Keep the diet clean and stay active.  Please get me a copy of your advanced directive form at your convenience.   Let us know if you need anything.

## 2021-10-18 NOTE — Progress Notes (Signed)
Chief Complaint  Patient presents with   Annual Exam     Well Woman Molly Savage is here for a complete physical.   Her last physical was >1 year ago.  Current diet: in general, a "healthy" diet. Current exercise: walking. Contraception? Yes Intentionally losing weight.  Fatigue out of ordinary? No Seatbelt? Yes Advanced directive? No  Health Maintenance Pap/HPV- Yes Tetanus- Yes HIV screening- Yes Hep C screening- Yes  DM 1 Hx of DM w neuropathy. Has failed gabapentin. Brother is on gabapentin and duloxetine and does well. Pt is interested. Sees endo. A1c one mo ago was 7.8. Affects her feet.   BP2 Depressive s/s's. Dx'd by psychiatry several years ago and is not currently seeing one. No SI or HI. She is not following with a psychologist at this time. No self medication.   Past Medical History:  Diagnosis Date   Anxiety    Depression    Diabetes mellitus type 1 ([place])    Follows with Endo   GERD (gastroesophageal reflux disease)    Hypertension    Tobacco abuse    Wears glasses      Past Surgical History:  Procedure Laterality Date   CESAREAN SECTION N/A 02/01/2016   Procedure: CESAREAN SECTION;  Surgeon: Mora Bellman, MD;  Location: [facility];  Service: Obstetrics;  Laterality: N/A;   CESAREAN SECTION     CESAREAN SECTION N/A 04/02/2020   Procedure: CESAREAN SECTION;  Surgeon: Donnamae Jude, MD;  Location: [facility];  Service: Obstetrics;  Laterality: N/A;   CHOLECYSTECTOMY N/A 06/16/2019   Procedure: LAPAROSCOPIC CHOLECYSTECTOMY;  Surgeon: Mickeal Skinner, MD;  Location: [facility];  Service: General;  Laterality: N/A;   FOOT SURGERY Left    TOOTH EXTRACTION  2018    Medications  Current Outpatient Medications on File Prior to Visit  Medication Sig Dispense Refill   acetaminophen (TYLENOL) 500 MG tablet Take 1,000 mg by mouth every 6 (six) hours as needed for mild pain or headache.     buPROPion (ZYBAN) 150 MG 12 hr tablet 1 week before quit  date, take 1 tab daily for 3 days and then 1 tab twice daily. 60 tablet 2   cetirizine (ZYRTEC) 10 MG tablet Take 1 tablet (10 mg total) by mouth daily. 30 tablet 11   Insulin Disposable Pump (OMNIPOD DASH PODS, GEN 4,) MISC Inject into the skin.     Insulin Infusion Pump (MINIMED 630G INSULIN PUMP) KIT by Does not apply route.     lisinopril (ZESTRIL) 10 MG tablet Take 1 tablet by mouth once daily 90 tablet 0   montelukast (SINGULAIR) 10 MG tablet Take 1 tablet (10 mg total) by mouth at bedtime. 30 tablet 3   nicotine (NICODERM CQ - DOSED IN MG/24 HR) 7 mg/24hr patch Place 1 patch (7 mg total) onto the skin daily. 14 patch 0   NIFEdipine (PROCARDIA-XL/NIFEDICAL-XL) 30 MG 24 hr tablet Take 1 tablet (30 mg total) by mouth daily. 30 tablet 2   NOVOLOG 100 UNIT/ML injection SMARTSIG:0-150 Unit(s) SUB-Q     pantoprazole (PROTONIX) 40 MG tablet TAKE 1 TABLET BY MOUTH ONCE DAILY 20-30  MINUTES  BEFORE  SUPPER 90 tablet 0   SUMAtriptan (IMITREX) 100 MG tablet TAKE 1 TABLET BY MOUTH AS NEEDED FOR MIGRAINE HEADACHE. MAY REPEAT IN 2 HOURS IF HEADACHE PERSISTS OR RECURS. NO MORE THAN 2 PER 24 HOURS 10 tablet 0   Vitamin D, Ergocalciferol, (DRISDOL) 1.25 MG (50000 UNIT) CAPS capsule Take 1 capsule (  50,000 Units total) by mouth every 7 (seven) days. 12 capsule 0   Allergies No Known Allergies  Review of Systems: Constitutional:  no unexpected weight changes Eye:  no recent significant change in vision Ear/Nose/Mouth/Throat:  Ears:  no tinnitus or vertigo and no recent change in hearing Nose/Mouth/Throat:  no complaints of nasal congestion, no sore throat Cardiovascular: no chest pain Respiratory:  no cough and no shortness of breath Gastrointestinal:  no abdominal pain, no change in bowel habits GU:  Female: negative for dysuria or pelvic pain Musculoskeletal/Extremities:  no pain of the joints Integumentary (Skin/Breast):  no abnormal skin lesions reported Neurologic: +neuropathy in feet Endocrine:   denies fatigue Hematologic/Lymphatic:  No areas of easy bleeding  Exam BP (!) 140/90   Pulse 90   Resp 18   Ht 5' 8"  (1.727 m)   Wt 227 lb (103 kg)   SpO2 98%   BMI 34.52 kg/m  General:  well developed, well nourished, in no apparent distress Skin:  no significant moles, warts, or growths Head:  no masses, lesions, or tenderness Eyes:  pupils equal and round, sclera anicteric without injection Ears:  canals without lesions, TMs shiny without retraction, no obvious effusion, no erythema Nose:  nares patent, mucosa normal, and no drainage  Throat/Pharynx:  lips and gingiva without lesion; tongue and uvula midline; non-inflamed pharynx; no exudates or postnasal drainage Neck: neck supple without adenopathy, thyromegaly, or masses Lungs:  clear to auscultation, breath sounds equal bilaterally, no respiratory distress Cardio:  regular rate and rhythm, no bruits, no LE edema Abdomen:  abdomen soft, nontender; bowel sounds normal; no masses or organomegaly Genital: Defer to GYN Musculoskeletal:  symmetrical muscle groups noted without atrophy or deformity Extremities:  no clubbing, cyanosis, or edema, no deformities, no skin discoloration Neuro:  gait normal; deep tendon reflexes normal and symmetric Psych: well oriented with normal range of affect and appropriate judgment/insight  Assessment and Plan  Well adult exam - Plan: CBC, Comprehensive metabolic panel, Lipid panel  Bipolar 2 disorder (HCC) - Plan: ARIPiprazole (ABILIFY) 2 MG tablet  Type 1 diabetes mellitus with diabetic neuropathy, unspecified (HCC) - Plan: DULoxetine (CYMBALTA) 20 MG capsule, Microalbumin / creatinine urine ratio  Elevated blood pressure reading  Need for influenza vaccination - Plan: Flu Vaccine QUAD 65moIM (Fluarix, Fluzone & Alfiuria Quad PF)   Well 31y.o. female. Counseled on diet and exercise. Diabetic neuropathy: Chronic, uncontrolled. Appreciate endo. Add duloxetine 20 mg/d. Could adjust med vs  add gabapentin.  BP2- Start Abilify 2 mg/d.  We will send psychiatry information.  Follow-up on this in 1 month. She needs to take her blood pressure medicine routinely.  She will check her sugar at home and if consistently 140/90 or greater, we will adjust her medication regimen. Advanced directive form provided today.  Flu shot today.  The patient voiced understanding and agreement to the plan.  [place] DO 10/18/21 3:53 PM

## 2021-10-22 ENCOUNTER — Telehealth: Payer: Self-pay | Admitting: Family Medicine

## 2021-10-22 ENCOUNTER — Other Ambulatory Visit: Payer: Self-pay | Admitting: Family Medicine

## 2021-10-22 DIAGNOSIS — I10 Essential (primary) hypertension: Secondary | ICD-10-CM

## 2021-10-22 DIAGNOSIS — K219 Gastro-esophageal reflux disease without esophagitis: Secondary | ICD-10-CM

## 2021-10-22 MED ORDER — LEVOCETIRIZINE DIHYDROCHLORIDE 5 MG PO TABS: 5.0000 mg | ORAL_TABLET | Freq: Every evening | ORAL | 2 refills | Status: DC

## 2021-10-22 MED ORDER — PANTOPRAZOLE SODIUM 40 MG PO TBEC: 40.0000 mg | DELAYED_RELEASE_TABLET | Freq: Every day | ORAL | 0 refills | Status: DC

## 2021-10-22 MED ORDER — LISINOPRIL 10 MG PO TABS: 10.0000 mg | ORAL_TABLET | Freq: Every day | ORAL | 0 refills | Status: DC

## 2021-10-22 NOTE — Telephone Encounter (Signed)
This allergy medication is not on her list

## 2021-10-22 NOTE — Telephone Encounter (Signed)
Pt stated she would like a 3 month supply for this Rx.

## 2021-10-22 NOTE — Telephone Encounter (Signed)
Sent!

## 2021-10-22 NOTE — Telephone Encounter (Signed)
Medication:   levocetirizine (XYZAL) 5 MG tablet [[id number]]   Has the patient contacted their pharmacy? No. (If no, request that the patient contact the pharmacy for the refill.) (If yes, when and what did the pharmacy advise?)  Preferred Pharmacy (with phone number or street name):   [facility] [address] [place] - [place]  [address] [place] [address]  Phone:  [phone]  Fax:  [phone]   Agent: Please be advised that RX refills may take up to 3 business days. We ask that you follow-up with your pharmacy.

## 2021-10-22 NOTE — Telephone Encounter (Signed)
Medication:   lisinopril (ZESTRIL) 10 MG tablet [[id number]]   pantoprazole (PROTONIX) 40 MG tablet [[id number]]   Has the patient contacted their pharmacy? No. (If no, request that the patient contact the pharmacy for the refill.) (If yes, when and what did the pharmacy advise?)  Preferred Pharmacy (with phone number or street name):   [facility] [address] [place] - [place]  [address] [place] [address]  Phone:  [phone]  Fax:  [phone]   Agent: Please be advised that RX refills may take up to 3 business days. We ask that you follow-up with your pharmacy.

## 2021-11-01 ENCOUNTER — Telehealth: Payer: Self-pay | Admitting: Family Medicine

## 2021-11-01 NOTE — Telephone Encounter (Signed)
Change to Zyrtec or Allegra. Follow up in 1 week if no better. Ty.

## 2021-11-01 NOTE — Telephone Encounter (Signed)
Pt called stating the xyxal is not working with just one pill. Pt would like to know if there is something stronger she can take or if she could take two xyxal. Please Advise.

## 2021-11-01 NOTE — Telephone Encounter (Signed)
Patient states she is unable to do nasal sprays and would like another alternative

## 2021-11-01 NOTE — Telephone Encounter (Signed)
Just one pill. Add Flonase. OK to call in if she can't get OTC. Ty.

## 2021-11-02 NOTE — Telephone Encounter (Signed)
Called no answer/VM not set up yet

## 2021-11-05 NOTE — Telephone Encounter (Signed)
Called and no answer/mail box was full. 

## 2021-11-14 ENCOUNTER — Ambulatory Visit: Payer: Medicaid Other | Admitting: Family Medicine

## 2021-11-15 ENCOUNTER — Ambulatory Visit (INDEPENDENT_AMBULATORY_CARE_PROVIDER_SITE_OTHER): Payer: Medicaid Other | Admitting: Family Medicine

## 2021-11-15 ENCOUNTER — Encounter: Payer: Self-pay | Admitting: Family Medicine

## 2021-11-15 VITALS — BP 118/76 | HR 81 | Temp 98.7°F | Resp 16 | Wt 230.0 lb

## 2021-11-15 DIAGNOSIS — J302 Other seasonal allergic rhinitis: Secondary | ICD-10-CM | POA: Diagnosis not present

## 2021-11-15 DIAGNOSIS — Z720 Tobacco use: Secondary | ICD-10-CM

## 2021-11-15 DIAGNOSIS — E1040 Type 1 diabetes mellitus with diabetic neuropathy, unspecified: Secondary | ICD-10-CM

## 2021-11-15 DIAGNOSIS — H9313 Tinnitus, bilateral: Secondary | ICD-10-CM

## 2021-11-15 MED ORDER — IRON (FERROUS SULFATE) 325 (65 FE) MG PO TABS: 325.0000 mg | ORAL_TABLET | Freq: Two times a day (BID) | ORAL | 2 refills | Status: DC

## 2021-11-15 MED ORDER — DULOXETINE HCL 30 MG PO CPEP: 30.0000 mg | ORAL_CAPSULE | Freq: Every day | ORAL | 3 refills | Status: DC

## 2021-11-15 MED ORDER — BUPROPION HCL ER (SMOKING DET) 150 MG PO TB12: ORAL_TABLET | ORAL | 2 refills | Status: DC

## 2021-11-15 MED ORDER — FLUTICASONE PROPIONATE 50 MCG/ACT NA SUSP: 2.0000 | Freq: Every day | NASAL | 6 refills | Status: DC

## 2021-11-15 NOTE — Patient Instructions (Signed)
Take your iron again.   Keep the diet clean and stay active.  Let us know if you need anything.

## 2021-11-15 NOTE — Progress Notes (Signed)
Chief Complaint  Patient presents with   Diabetes    Follow up, neuropathy not better with Duloxetine   Hypertension    Here for follow up    Subjective: Patient is a 31 y.o. female here for f/u.  Patient has a history of diabetic neuropathy.  She was placed on Cymbalta 20 mg daily which did initially help.  Cold weather seems to exacerbate symptoms and when it got colder outside, the medication became less effective.  She is not having side effects and she reports compliance with her medication.  Patient continues to smoke daily.  She was on Zyban in the past but stopped when she became pregnant.  She has not restarted it.  She uses nicotine patches which seem to wear off after 5 hours.  She is wondering if she was supposed to take both at the same time (answer is yes).  The patient notes a several week history of hair thinning.  She has a history of iron deficiency anemia manifesting as pica/eating cigarette ashes.  Since delivering, she has not taken any supplemental iron.  She has a history of seasonal allergies.  She takes Xyzal 5 mg daily which do help her symptoms but she is not having some residual.  She is unable to take a nasal spray but has never been properly instructed.  She is not on any other medications for this.  Of note, she has worsening ringing in her ears.  She would like to see ENT again.  Past Medical History:  Diagnosis Date   Anxiety    Depression    Diabetes mellitus type 1 ([place])    Follows with Endo   GERD (gastroesophageal reflux disease)    Hypertension    Tobacco abuse    Wears glasses     Objective: BP 118/76 (BP Location: Right Arm, Cuff Size: Large)   Pulse 81   Temp 98.7 F (37.1 C) (Oral)   Resp 16   Wt 230 lb (104.3 kg)   SpO2 100%   BMI 34.97 kg/m  General: Awake, appears stated age Ears: Canals patent without otorrhea bilaterally, TMs negative bilaterally Heart: RRR, no LE edema Lungs: CTAB, no rales, wheezes or rhonchi. No accessory  muscle use Psych: Age appropriate judgment and insight, normal affect and mood  Assessment and Plan: Seasonal allergies - Plan: fluticasone (FLONASE) 50 MCG/ACT nasal spray  Tobacco abuse - Plan: buPROPion (ZYBAN) 150 MG 12 hr tablet  Type 1 diabetes mellitus with diabetic neuropathy, unspecified (HCC) - Plan: DULoxetine (CYMBALTA) 30 MG capsule  Tinnitus of both ears - Plan: Ambulatory referral to ENT  Chronic, uncontrolled.  Continue Xyzal 5 mg daily.  Add Flonase daily.  We discussed proper technique today. Chronic, uncontrolled.  Continue daily patches, start Zyban again. Chronic, uncontrolled.  Increase Cymbalta from 20 mg daily to 30 mg daily.  Follow-up in 1 month to recheck this. I did tell her there is likely little she can do for this but we will refer her to a specialist at her request. The patient voiced understanding and agreement to the plan.  [place], DO 11/15/21  1:19 PM

## 2021-11-20 ENCOUNTER — Other Ambulatory Visit: Payer: Self-pay | Admitting: Family Medicine

## 2021-11-20 ENCOUNTER — Telehealth: Payer: Self-pay | Admitting: Family Medicine

## 2021-11-20 DIAGNOSIS — L853 Xerosis cutis: Secondary | ICD-10-CM

## 2021-11-20 NOTE — Telephone Encounter (Signed)
Patient called to see if Dr. Nani Ravens could refer her to a dermatologist as she has a lot of dead skin behind her ears, on her lower arms and legs and on her scalp as well. Patient said it does not matter how much she washes, she still sees it. Patient was just here last week and forgot to mention. Would like referral sent to an office in [place] where she lives. Please call patient to advise where referral will be sent.

## 2021-11-20 NOTE — Telephone Encounter (Signed)
Referral done/called the patient informed referral done.

## 2021-11-22 ENCOUNTER — Ambulatory Visit: Payer: Medicaid Other | Admitting: Podiatry

## 2021-11-26 ENCOUNTER — Telehealth: Payer: Self-pay | Admitting: Podiatry

## 2021-11-26 NOTE — Telephone Encounter (Signed)
DOS: 12/26/2021  Medicaid [place] Access  Procedures: Hammertoe Repair 3rd, 4th, and 5th Lt ([id number])  DX: M20.42  Prior Authorization is Not required for patients insurance.

## 2021-12-11 ENCOUNTER — Ambulatory Visit (INDEPENDENT_AMBULATORY_CARE_PROVIDER_SITE_OTHER): Payer: Medicaid Other | Admitting: Podiatry

## 2021-12-11 ENCOUNTER — Ambulatory Visit: Payer: Medicaid Other

## 2021-12-11 ENCOUNTER — Ambulatory Visit (INDEPENDENT_AMBULATORY_CARE_PROVIDER_SITE_OTHER): Payer: Medicaid Other

## 2021-12-11 DIAGNOSIS — M2042 Other hammer toe(s) (acquired), left foot: Secondary | ICD-10-CM

## 2021-12-11 DIAGNOSIS — M7742 Metatarsalgia, left foot: Secondary | ICD-10-CM

## 2021-12-11 DIAGNOSIS — M21611 Bunion of right foot: Secondary | ICD-10-CM

## 2021-12-11 DIAGNOSIS — M21619 Bunion of unspecified foot: Secondary | ICD-10-CM

## 2021-12-12 ENCOUNTER — Other Ambulatory Visit: Payer: Self-pay | Admitting: Family Medicine

## 2021-12-12 ENCOUNTER — Telehealth: Payer: Self-pay | Admitting: Family Medicine

## 2021-12-12 MED ORDER — CETIRIZINE HCL 10 MG PO TABS: 10.0000 mg | ORAL_TABLET | Freq: Every day | ORAL | 11 refills | Status: DC

## 2021-12-12 NOTE — Telephone Encounter (Signed)
Patient called to see if she could get a prescription for zyrtec because it helps more than the xyzal did. Please call patient to advise.

## 2021-12-18 ENCOUNTER — Encounter: Payer: Self-pay | Admitting: Family Medicine

## 2021-12-18 ENCOUNTER — Other Ambulatory Visit: Payer: Self-pay | Admitting: Podiatry

## 2021-12-18 ENCOUNTER — Ambulatory Visit (INDEPENDENT_AMBULATORY_CARE_PROVIDER_SITE_OTHER): Payer: Medicaid Other | Admitting: Family Medicine

## 2021-12-18 VITALS — BP 122/85 | HR 72 | Temp 98.0°F | Ht 68.5 in | Wt 232.5 lb

## 2021-12-18 DIAGNOSIS — M549 Dorsalgia, unspecified: Secondary | ICD-10-CM | POA: Diagnosis not present

## 2021-12-18 DIAGNOSIS — E1040 Type 1 diabetes mellitus with diabetic neuropathy, unspecified: Secondary | ICD-10-CM | POA: Diagnosis not present

## 2021-12-18 DIAGNOSIS — M2042 Other hammer toe(s) (acquired), left foot: Secondary | ICD-10-CM

## 2021-12-18 DIAGNOSIS — M7742 Metatarsalgia, left foot: Secondary | ICD-10-CM

## 2021-12-18 DIAGNOSIS — M21619 Bunion of unspecified foot: Secondary | ICD-10-CM

## 2021-12-18 DIAGNOSIS — M21611 Bunion of right foot: Secondary | ICD-10-CM

## 2021-12-18 MED ORDER — VENLAFAXINE HCL ER 75 MG PO CP24: 75.0000 mg | ORAL_CAPSULE | Freq: Every day | ORAL | 3 refills | Status: DC

## 2021-12-18 NOTE — Progress Notes (Signed)
Subjective: No chief complaint on file.   31 year old female presents the office today with concerns of continued pain and deformity of her hammertoes of her left third, fourth, fifth toes.  She also has been getting pain and to the foot on the left side just behind the ball of the foot that she points to.  She discharged she modifications, offloading, padding.  She describes a stabbing sensation.  Her fifth toe has been sitting up some and she is got a reoccurrence of a callus.  No recent injury or changes that she reports.  Objective: AAO x3, NAD DP/PT pulses palpable bilaterally, CRT less than 3 seconds On the left foot there is hammertoe deformity present in the left third, fourth, fifth toe.  There is pinch callus noted on the fourth toe.  Hyperkeratotic lesion along the area of the scar on the on the left side.  There is no edema, erythema or signs of infection.  On the left foot she is getting tenderness just proximal to the metatarsal heads but not able to identify any area of soft tissue mass able to palpate any area of discomfort today. No pain with calf compression, swelling, warmth, erythema  Assessment: Bilateral foot pain, additional deformity, right foot bunion, callus  Plan: -All treatment options discussed with the patient including all alternatives, risks, complications.  -X-rays were obtained reviewed.  3 views were obtained.  No subacute fracture.  Hammertoes are present.  Prior fifth metatarsal head resection. -Given her new symptoms of pain despite metatarsal head x-rays were negative.  I have ordered an MRI to further evaluate this area.  She has attempted conservative care without significant improvement.  This is for potential surgical planning as well. -We discussed delaying surgery which is scheduled for continued.  She also states this works out good for her schedule given the holidays coming up.  Will plan on doing this in January February pending the MRI.  Vivi Barrack DPM

## 2021-12-18 NOTE — Patient Instructions (Addendum)
We are replacing the duloxetine with the venlafaxine.   Let us know if you need anything.  Mid-Back Strain Rehab It is normal to feel mild stretching, pulling, tightness, or discomfort as you do these exercises, but you should stop right away if you feel sudden pain or your pain gets worse.  Stretching and range of motion exercises This exercise warms up your muscles and joints and improves the movement and flexibility of your back and shoulders. This exercise also help to relieve pain. Exercise A: Chest and spine stretch  Lie down on your back on a firm surface. Roll a towel or a small blanket so it is about 4 inches (10 cm) in diameter. Put the towel lengthwise under the middle of your back so it is under your spine, but not under your shoulder blades. To increase the stretch, you may put your hands behind your head and let your elbows fall to your sides. Hold for 30 seconds. Repeat exercise 2 times. Complete this exercise 3 times per week. Strengthening exercises These exercises build strength and endurance in your back and your shoulder blade muscles. Endurance is the ability to use your muscles for a long time, even after they get tired. Exercise C: Straight arm rows (shoulder extension) Stand with your feet shoulder width apart. Secure an exercise band to a stable object in front of you so the band is at or above shoulder height. Hold one end of the exercise band in each hand. Straighten your elbows and lift your hands up to shoulder height. Step back, away from the secured end of the exercise band, until the band stretches. Squeeze your shoulder blades together and pull your hands down to the sides of your thighs. Stop when your hands are straight down by your sides. Do not let your hands go behind your body. Hold for 2 seconds. Slowly return to the starting position. Repeat 2 times. Complete this exercise 3 times per week. Exercise D: Shoulder external rotation, prone Lie on your  abdomen on a firm bed so your left / right forearm hangs over the edge of the bed and your upper arm is on the bed, straight out from your body. Your elbow should be bent. Your palm should be facing your feet. If instructed, hold a 2-5 lb weight in your hand. Squeeze your shoulder blade toward the middle of your back. Do not let your shoulder lift toward your ear. Keep your elbow bent in an "L" shape (90 degrees) while you slowly move your forearm up toward the ceiling. Move your forearm up to the height of the bed, toward your head. Your upper arm should not move. At the top of the movement, your palm should face the floor. Hold for 1 second. Slowly return to the starting position and relax your muscles. Repeat 2 times. Complete this exercise 3 times per week. Exercise E: Scapular retraction and external rotation, rowing  Sit in a stable chair without armrests, or stand. Secure an exercise band to a stable object in front of you so it is at shoulder height. Hold one end of the exercise band in each hand. Bring your arms out straight in front of you. Step back, away from the secured end of the exercise band, until the band stretches. Pull the band backward. As you do this, bend your elbows and squeeze your shoulder blades together, but avoid letting the rest of your body move. Do not let your shoulders lift up toward your ears. Stop when  your elbows are at your sides or slightly behind your body. Hold for 1 second1. Slowly straighten your arms to return to the starting position. Repeat 2 times. Complete this exercise 3 times per week. Posture and body mechanics  Body mechanics refers to the movements and positions of your body while you do your daily activities. Posture is part of body mechanics. Good posture and healthy body mechanics can help to relieve stress in your body's tissues and joints. Good posture means that your spine is in its natural S-curve position (your spine is neutral),  your shoulders are pulled back slightly, and your head is not tipped forward. The following are general guidelines for applying improved posture and body mechanics to your everyday activities. Standing  When standing, keep your spine neutral and your feet about hip-width apart. Keep a slight bend in your knees. Your ears, shoulders, and hips should line up. When you do a task in which you lean forward while standing in one place for a long time, place one foot up on a stable object that is 2-4 inches (5-10 cm) high, such as a footstool. This helps keep your spine neutral. Sitting  When sitting, keep your spine neutral and keep your feet flat on the floor. Use a footrest, if necessary, and keep your thighs parallel to the floor. Avoid rounding your shoulders, and avoid tilting your head forward. When working at a desk or a computer, keep your desk at a height where your hands are slightly lower than your elbows. Slide your chair under your desk so you are close enough to maintain good posture. When working at a computer, place your monitor at a height where you are looking straight ahead and you do not have to tilt your head forward or downward to look at the screen. Resting  When lying down and resting, avoid positions that are most painful for you. If you have pain with activities such as sitting, bending, stooping, or squatting (flexion-based activities), lie in a position in which your body does not bend very much. For example, avoid curling up on your side with your arms and knees near your chest (fetal position). If you have pain with activities such as standing for a long time or reaching with your arms (extension-based activities), lie with your spine in a neutral position and bend your knees slightly. Try the following positions: Lying on your side with a pillow between your knees. Lying on your back with a pillow under your knees.  Lifting  When lifting objects, keep your feet at least  shoulder-width apart and tighten your abdominal muscles. Bend your knees and hips and keep your spine neutral. It is important to lift using the strength of your legs, not your back. Do not lock your knees straight out. Always ask for help to lift heavy or awkward objects. Make sure you discuss any questions you have with your health care provider. Document Released: 01/07/2005 Document Revised: 09/14/2015 Document Reviewed: 10/19/2014 Elsevier Interactive Patient Education  [employer].

## 2021-12-18 NOTE — Progress Notes (Signed)
Chief Complaint  Patient presents with   Follow-up    Medication     Subjective: Patient is a 31 y.o. female here for f/u neuropathy.  Cymbalta was increased from 20 mg/d to 30 mg/d. Since that time, she reports compliance w/o AE's other than dark stools (blue capsule). Still has issues, would like to see if there are other options. Has never been on venlafaxine.   Past Medical History:  Diagnosis Date   Anxiety    Depression    Diabetes mellitus type 1 (HCC)    Follows with Endo   GERD (gastroesophageal reflux disease)    Hypertension    Tobacco abuse    Wears glasses     Objective: BP 122/85 (BP Location: Right Arm, Patient Position: Sitting, Cuff Size: Normal)   Pulse 72   Temp 98 F (36.7 C) (Oral)   Ht 5' 8.5" (1.74 m)   Wt 232 lb 8 oz (105.5 kg)   SpO2 96%   BMI 34.84 kg/m  General: Awake, appears stated age Lungs: No accessory muscle use Psych: Age appropriate judgment and insight, normal affect and mood  Assessment and Plan: Type 1 diabetes mellitus with diabetic neuropathy, unspecified (HCC)  Chronic,, uncontrolled. Will stop Cymbalta, start Effexor 75 mg/d. F/u in 1 mo.  The patient voiced understanding and agreement to the plan.  Jilda Roche [place], DO 12/18/21  1:28 PM

## 2021-12-31 ENCOUNTER — Encounter: Payer: Medicaid Other | Admitting: Podiatry

## 2021-12-31 ENCOUNTER — Other Ambulatory Visit: Payer: Medicaid Other

## 2022-01-10 ENCOUNTER — Other Ambulatory Visit: Payer: Medicaid Other

## 2022-01-10 ENCOUNTER — Encounter: Payer: Medicaid Other | Admitting: Podiatry

## 2022-01-23 ENCOUNTER — Telehealth: Payer: Self-pay | Admitting: Family Medicine

## 2022-01-24 ENCOUNTER — Encounter: Payer: Medicaid Other | Admitting: Podiatry

## 2022-02-16 ENCOUNTER — Other Ambulatory Visit: Payer: Self-pay | Admitting: Family Medicine

## 2022-02-16 DIAGNOSIS — I10 Essential (primary) hypertension: Secondary | ICD-10-CM

## 2022-02-16 DIAGNOSIS — K219 Gastro-esophageal reflux disease without esophagitis: Secondary | ICD-10-CM

## 2022-04-09 ENCOUNTER — Encounter: Payer: Self-pay | Admitting: Family Medicine

## 2022-04-09 ENCOUNTER — Telehealth (INDEPENDENT_AMBULATORY_CARE_PROVIDER_SITE_OTHER): Payer: Medicaid Other | Admitting: Family Medicine

## 2022-04-09 DIAGNOSIS — K219 Gastro-esophageal reflux disease without esophagitis: Secondary | ICD-10-CM

## 2022-04-09 DIAGNOSIS — Z3A01 Less than 8 weeks gestation of pregnancy: Secondary | ICD-10-CM | POA: Diagnosis not present

## 2022-04-09 DIAGNOSIS — I10 Essential (primary) hypertension: Secondary | ICD-10-CM

## 2022-04-09 MED ORDER — CETIRIZINE HCL 10 MG PO TABS: 10.0000 mg | ORAL_TABLET | Freq: Every day | ORAL | 11 refills | Status: DC

## 2022-04-09 MED ORDER — LABETALOL HCL 200 MG PO TABS: 200.0000 mg | ORAL_TABLET | Freq: Two times a day (BID) | ORAL | 2 refills | Status: DC

## 2022-04-09 MED ORDER — LISINOPRIL 10 MG PO TABS: 10.0000 mg | ORAL_TABLET | Freq: Every day | ORAL | 0 refills | Status: DC

## 2022-04-09 MED ORDER — BLOOD PRESSURE CUFF MISC: 0 refills | Status: DC

## 2022-04-09 MED ORDER — PROMETHAZINE HCL 25 MG PO TABS: 25.0000 mg | ORAL_TABLET | Freq: Three times a day (TID) | ORAL | 0 refills | Status: DC | PRN

## 2022-04-09 MED ORDER — PANTOPRAZOLE SODIUM 40 MG PO TBEC: 40.0000 mg | DELAYED_RELEASE_TABLET | Freq: Two times a day (BID) | ORAL | 2 refills | Status: DC

## 2022-04-09 NOTE — Progress Notes (Signed)
Chief Complaint  Patient presents with   Follow-up    Pregnant Discuss medications    Subjective Molly Savage is a 32 y.o. female who presents for hypertension follow up. We are interacting via web portal for an electronic face-to-face visit. I verified patient's ID using 2 identifiers. Patient agreed to proceed with visit via this method. Patient is at home, I am at office. Patient and I are present for visit.  She does not monitor home blood pressures. She has been off of her lisinopril for 6 weeks. She is now pregnant (<8 weeks per [place]). Patient has these side effects of medication: none She is not adhering to a healthy diet overall. Current exercise: some walking, limited NO Cp or SOb.  GERD Hx of GERD on Protonix 40 mg bid. Compliant, no AE's. No bleeding, trouble swallowing, pain.    Past Medical History:  Diagnosis Date   Anxiety    Depression    Diabetes mellitus type 1 ([place])    Follows with Endo   GERD (gastroesophageal reflux disease)    Hypertension    Tobacco abuse    Wears glasses     Exam No conversational dyspnea Age appropriate judgment and insight Nml affect and mood  Less than [redacted] weeks gestation of pregnancy  Chronic hypertension - Plan: labetalol (NORMODYNE) 200 MG tablet, Blood Pressure Monitoring (BLOOD PRESSURE CUFF) MISC, DISCONTINUED: lisinopril (ZESTRIL) 10 MG tablet  Gastroesophageal reflux disease, unspecified whether esophagitis present - Plan: pantoprazole (PROTONIX) 40 MG tablet  Encouraged f/u w OB team, continuing PNV. Stay away from ACEi's. Start labetalol 200 mg bid. Monitor BP at home. Sent in cuff. Counseled on diet and exercise. Chronic, stable. Cont Protonix 40 mg bid for now.  She is changing pcp's due to location, will wish her well.  The patient voiced understanding and agreement to the plan.  [place], DO 04/09/22  4:43 PM

## 2022-07-14 ENCOUNTER — Other Ambulatory Visit: Payer: Self-pay | Admitting: Family Medicine

## 2022-07-14 DIAGNOSIS — I10 Essential (primary) hypertension: Secondary | ICD-10-CM

## 2022-08-15 ENCOUNTER — Other Ambulatory Visit: Payer: Self-pay | Admitting: Family Medicine

## 2022-08-15 DIAGNOSIS — I10 Essential (primary) hypertension: Secondary | ICD-10-CM

## 2022-09-19 ENCOUNTER — Other Ambulatory Visit: Payer: Self-pay | Admitting: Family Medicine

## 2022-09-19 DIAGNOSIS — I10 Essential (primary) hypertension: Secondary | ICD-10-CM

## 2023-01-03 ENCOUNTER — Telehealth: Payer: Self-pay | Admitting: Family Medicine

## 2023-01-03 NOTE — Telephone Encounter (Signed)
Recommend scheduling appointment or going to urgent care if after hours.

## 2023-01-03 NOTE — Telephone Encounter (Signed)
Patient called and wanted to know if Dr. Carmelia Roller prescribed her muscle relaxer in the past and if he did, could she have it refilled.   She explained that in October she gave birth and received a spinal anesthetic that caused issues with her back.   I informed the patient that she may need an OV due to not being seen since March. Patient asked if a nurse could give her a call. Please call and advise.

## 2023-01-03 NOTE — Telephone Encounter (Signed)
Pt called and lvm to schedule an appt

## 2023-01-06 NOTE — Telephone Encounter (Signed)
Called pt lvm needing to scheduled appt.

## 2023-01-07 NOTE — Telephone Encounter (Signed)
Called pt and sent mychart message asking to call and schedule appt.

## 2023-02-05 ENCOUNTER — Encounter: Payer: Self-pay | Admitting: Family Medicine

## 2023-02-05 ENCOUNTER — Telehealth (INDEPENDENT_AMBULATORY_CARE_PROVIDER_SITE_OTHER): Payer: MEDICAID | Admitting: Family Medicine

## 2023-02-05 DIAGNOSIS — E1040 Type 1 diabetes mellitus with diabetic neuropathy, unspecified: Secondary | ICD-10-CM

## 2023-02-05 DIAGNOSIS — M545 Low back pain, unspecified: Secondary | ICD-10-CM | POA: Diagnosis not present

## 2023-02-05 DIAGNOSIS — G4700 Insomnia, unspecified: Secondary | ICD-10-CM

## 2023-02-05 DIAGNOSIS — G8929 Other chronic pain: Secondary | ICD-10-CM

## 2023-02-05 MED ORDER — VENLAFAXINE HCL ER 150 MG PO CP24
150.0000 mg | ORAL_CAPSULE | Freq: Every day | ORAL | 1 refills | Status: DC
Start: 2023-02-05 — End: 2023-03-31

## 2023-02-05 MED ORDER — CYCLOBENZAPRINE HCL 10 MG PO TABS
5.0000 mg | ORAL_TABLET | Freq: Three times a day (TID) | ORAL | 0 refills | Status: DC | PRN
Start: 2023-02-05 — End: 2023-05-27

## 2023-02-05 NOTE — Progress Notes (Signed)
 Chief Complaint  Patient presents with   Medication Refill    Medication check    Subjective: Patient is a 33 y.o. female here for f/u. We are interacting via web portal for an electronic face-to-face visit. I verified patient's ID using 2 identifiers. Patient agreed to proceed with visit via this method. Patient is at home, I am at office. Patient and I are present for visit.   Neuropathy: Hx of neuropathy 2/2 DM I. She was taking Effexor  XR 75 mg/d prior to getting pregnant. Resumed after delivery and is not as effective. Compliant, no AE's.  She does follow with the endocrinology team.  Insomnia: Over the past several months, the patient has noted that she wakes up with headaches and feeling poorly rested.  She wakes up several times a melanite taking 30 to 60 minutes to fall back asleep.  She does have a history of anxiety.  Medication as above.  She drinks diet Cokes throughout the day.  No napping.  No alcohol use.  Low back pain After the patient had her C-section, she has had bilateral low back pain.  No neural signs or symptoms, bowel/bladder incontinence, bruising, redness, swelling.  She requested a muscle relaxer from her OB team but they do not prescribe this medication.  She has been doing stretches at home.  This has not been helpful.  Past Medical History:  Diagnosis Date   Anxiety    Depression    Diabetes mellitus type 1 (HCC)    Follows with Endo   GERD (gastroesophageal reflux disease)    Hypertension    Tobacco abuse    Wears glasses     Objective: No conversational dyspnea Age appropriate judgment and insight Nml affect and mood  Assessment and Plan: Type 1 diabetes mellitus with diabetic neuropathy, unspecified (HCC) - Plan: venlafaxine  XR (EFFEXOR -XR) 150 MG 24 hr capsule  Insomnia, unspecified type  Chronic bilateral low back pain, unspecified whether sciatica present - Plan: cyclobenzaprine  (FLEXERIL ) 10 MG tablet  Chronic, uncontrolled. Increase  Effexor  XR from 75 mg/d to 150 mg/d.  Chronic, uncontrolled.  She will see how things go with the above medication change.  Send me a message in 2 weeks with how things are going.  Would want to wean her off of Flexeril  prior to prescribing any more serotonin affecting medications.  Could consider Belsomra. Chronic, not controlled.  Stretches and exercises sent via MyChart.  Heat, ice, Tylenol , low-dose of Flexeril  as needed.  She reports she has been on this before and it does not make her drowsy. I will see her in 1 month to recheck the above. The patient voiced understanding and agreement to the plan.  Shellie Dials [place], DO 02/05/23  7:10 AM

## 2023-02-25 ENCOUNTER — Encounter: Payer: Self-pay | Admitting: Family

## 2023-02-25 ENCOUNTER — Telehealth (INDEPENDENT_AMBULATORY_CARE_PROVIDER_SITE_OTHER): Payer: MEDICAID | Admitting: Family

## 2023-02-25 VITALS — Ht 68.5 in

## 2023-02-25 DIAGNOSIS — J209 Acute bronchitis, unspecified: Secondary | ICD-10-CM | POA: Diagnosis not present

## 2023-02-25 MED ORDER — BENZONATATE 200 MG PO CAPS: 200.0000 mg | ORAL_CAPSULE | Freq: Three times a day (TID) | ORAL | 0 refills | Status: DC | PRN

## 2023-02-25 MED ORDER — AZITHROMYCIN 250 MG PO TABS: ORAL_TABLET | ORAL | 0 refills | Status: DC

## 2023-02-25 NOTE — Progress Notes (Signed)
 Molly Savage is a 33 y.o. female with the following history as recorded in [employer]:  Patient Active Problem List   Diagnosis Date Noted   Seasonal allergies 11/15/2021   Bipolar 2 disorder (HCC) 10/18/2021   Intertriginous candidiasis 04/20/2020   History of cesarean section 04/02/2020   PPH (postpartum hemorrhage) 04/02/2020   Preeclampsia 01/27/2020   Previous cesarean delivery x 1, antepartum 10/27/2019   Supervision of high risk pregnancy, antepartum 09/22/2019   Chronic hypertension with superimposed preeclampsia 09/22/2019   Gastroesophageal reflux disease 11/25/2018   Allergies 11/12/2017   Increased ammonia level 09/13/2017   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   Poor memory 07/10/2017   Skin lesion of left leg 06/18/2017   Patellofemoral arthralgia of both knees 05/21/2017   Left foot pain 05/21/2017   Callus of foot 04/24/2017   Morbid obesity (HCC) 01/30/2017   Lactose intolerance 12/26/2015   BMI 40.0-44.9, adult (HCC) 09/18/2015   Situational mixed anxiety and depressive disorder 07/30/2015   Depression 07/30/2015   Diabetic foot infection (HCC) 04/17/2015   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 6 tablet 0   benzonatate  (TESSALON ) 200 MG capsule Take 1 capsule (200 mg total) by mouth 3 (three) times daily as needed for cough. 20 capsule 0   cetirizine  (ZYRTEC ) 10 MG tablet Take 1 tablet (10 mg total) by mouth daily. 30 tablet 11   cyclobenzaprine  (FLEXERIL ) 10 MG tablet Take 0.5-1 tablets (5-10 mg total) by mouth 3 (three) times daily as needed for muscle spasms. 21  tablet 0   labetalol  (NORMODYNE ) 200 MG tablet Take 1 tablet by mouth twice daily 60 tablet 0   NOVOLOG  100 UNIT/ML injection SMARTSIG:0-150 Unit(s) SUB-Q     pantoprazole  (PROTONIX ) 40 MG tablet Take 1 tablet (40 mg total) by mouth 2 (two) times daily before a meal. 180 tablet 2   venlafaxine  XR (EFFEXOR -XR) 150 MG 24 hr capsule Take 1 capsule (150 mg total) by mouth daily with breakfast. 30 capsule 1   No current facility-administered medications for this visit.    Allergies: Patient has no known allergies.  Past Medical History:  Diagnosis Date   Anxiety    Depression    Diabetes mellitus type 1 (HCC)    Follows with Endo   GERD (gastroesophageal reflux disease)    Hypertension    Tobacco abuse    Wears glasses     Past Surgical History:  Procedure Laterality Date   CESAREAN SECTION N/A 02/01/2016   Procedure: CESAREAN SECTION;  Surgeon: Winton Felt, MD;  Location: [facility];  Service: Obstetrics;  Laterality: N/A;   CESAREAN SECTION     CESAREAN SECTION N/A 04/02/2020   Procedure: CESAREAN SECTION;  Surgeon: Fredirick Glenys RAMAN, MD;  Location: [facility];  Service: Obstetrics;  Laterality: N/A;   CHOLECYSTECTOMY N/A 06/16/2019   Procedure: LAPAROSCOPIC CHOLECYSTECTOMY;  Surgeon: Stevie Herlene Righter, MD;  Location: [facility];  Service: General;  Laterality: N/A;   FOOT SURGERY Left    TOOTH EXTRACTION  2018    Family History  Problem Relation Age of Onset   Hypertension Mother  Depression Mother    Bipolar disorder Sister    Drug abuse Sister    Lupus Brother    Heart disease Maternal Uncle    ADD / ADHD Brother    Sleep apnea Brother    Colon cancer Neg Hx    Esophageal cancer Neg Hx    Rectal cancer Neg Hx    Stomach cancer Neg Hx     Social History   Tobacco Use   Smoking status: Every Day    Current packs/day: 0.50    Average packs/day: 0.5 packs/day for 12.0 years (6.0 ttl pk-yrs)    Types: Cigarettes   Smokeless tobacco: Never   Tobacco comments:     trying to cut back   Substance Use Topics   Alcohol use: Not Currently    Subjective:    I connected with Molly Savage on 02/25/23 at 11:00 AM EST by a video enabled telemedicine application and verified that I am speaking with the correct person using two identifiers.   I discussed the limitations of evaluation and management by telemedicine and the availability of in person appointments. The patient expressed understanding and agreed to proceed. Provider in office/ patient is at home; provider and patient are only 2 people on video call.   Per patient, she has been sick for 2 weeks; children all have had bronchitis; went to ER on Saturday and diagnosed with URI; negative COVID, flu; patient is concerned that she was mis-diagnosed and is requesting antibiotic due to length of time symptoms present; was given Promethazine  DM at ER but not helping;   LMP 2 weeks ago; not breast feeding;    Objective:  Vitals:   02/25/23 1059  Height: 5' 8.5 (1.74 m)    General: Well developed, well nourished, in no acute distress  Head: Normocephalic and atraumatic  Lungs: Respirations unlabored;  Neurologic: Alert and oriented; speech intact; face symmetrical;    Assessment:  1. Acute bronchitis, unspecified organism     Plan:  Rx for Z-pak #1 take as directed; Rx for Tessalon  Perles- use as directed; increase fluids, rest; if no improvement by the end of the week, she is asked to schedule an in person appointment with her PCP;  She is also encouraged to send a message to her PCP as requested after their visit in mid-January; he wanted to know her response to medication changes to formulate treatment plan.   No follow-ups on file.  No orders of the defined types were placed in this encounter.   Requested Prescriptions   Signed Prescriptions Disp Refills   azithromycin  (ZITHROMAX  Z-PAK) 250 MG tablet 6 tablet 0    Sig: Take 2 tablets (500 mg) PO today, then 1 tablet (250 mg) PO daily  x4 days.   benzonatate  (TESSALON ) 200 MG capsule 20 capsule 0    Sig: Take 1 capsule (200 mg total) by mouth 3 (three) times daily as needed for cough.

## 2023-03-05 ENCOUNTER — Telehealth: Payer: Self-pay | Admitting: Family

## 2023-03-05 ENCOUNTER — Encounter: Payer: Self-pay | Admitting: Family Medicine

## 2023-03-05 ENCOUNTER — Other Ambulatory Visit: Payer: Self-pay | Admitting: Family

## 2023-03-05 ENCOUNTER — Encounter: Payer: Self-pay | Admitting: Family

## 2023-03-05 DIAGNOSIS — R053 Chronic cough: Secondary | ICD-10-CM

## 2023-03-05 NOTE — Telephone Encounter (Signed)
lm

## 2023-03-30 ENCOUNTER — Other Ambulatory Visit: Payer: Self-pay | Admitting: Family Medicine

## 2023-03-30 DIAGNOSIS — E1040 Type 1 diabetes mellitus with diabetic neuropathy, unspecified: Secondary | ICD-10-CM

## 2023-03-31 ENCOUNTER — Other Ambulatory Visit: Payer: MEDICAID

## 2023-03-31 ENCOUNTER — Encounter: Payer: Self-pay | Admitting: *Deleted

## 2023-03-31 ENCOUNTER — Encounter: Payer: MEDICAID | Admitting: Family Medicine

## 2023-04-22 ENCOUNTER — Encounter: Payer: Self-pay | Admitting: *Deleted

## 2023-05-06 ENCOUNTER — Telehealth: Payer: Self-pay

## 2023-05-06 NOTE — Telephone Encounter (Signed)
 Patient stated that she will go to the [facility] in [place] and were the labs going to be placed.

## 2023-05-06 NOTE — Telephone Encounter (Signed)
 Copied from CRM [id number]. Topic: Clinical - Request for Lab/Test Order >> May 05, 2023 12:14 PM Fredrica W wrote: Reason for CRM: Patient called to reschedule appt for physical. Reschedule appt. Patient states only way she can due fasting labs is if provider puts order in for her to go somewhere in [place] to [facility] the morning of the appt 4/28. Also stated she does not usually eat until nighttime so she may be able to fast for appt depending on what she can have to drink. Thank You

## 2023-05-06 NOTE — Telephone Encounter (Signed)
 CBC, CMP, Lipid panel. Can order when she is here or ahead of time if she wishes. Thx.

## 2023-05-07 ENCOUNTER — Other Ambulatory Visit: Payer: Self-pay

## 2023-05-07 DIAGNOSIS — Z0000 Encounter for general adult medical examination without abnormal findings: Secondary | ICD-10-CM

## 2023-05-07 NOTE — Telephone Encounter (Signed)
 Called pt was advised labs are ordered for [facility], Pt stated understand.

## 2023-05-12 LAB — HEMOGLOBIN A1C: Hemoglobin A1C: 8.6

## 2023-05-13 ENCOUNTER — Other Ambulatory Visit: Payer: Self-pay | Admitting: Family Medicine

## 2023-05-13 DIAGNOSIS — E1040 Type 1 diabetes mellitus with diabetic neuropathy, unspecified: Secondary | ICD-10-CM

## 2023-05-19 ENCOUNTER — Encounter: Payer: MEDICAID | Admitting: Family Medicine

## 2023-05-23 ENCOUNTER — Telehealth: Payer: Self-pay | Admitting: Neurology

## 2023-05-23 NOTE — Telephone Encounter (Signed)
 Copied from CRM [id number]. Topic: Clinical - Request for Lab/Test Order >> May 23, 2023  4:20 PM Molly Savage wrote: Reason for CRM: Patient is wanting lab orders sent to [facility] in [place] she stated that it's closer to her and she stated she want her labs done before her appt.

## 2023-05-26 ENCOUNTER — Other Ambulatory Visit: Payer: Self-pay

## 2023-05-26 ENCOUNTER — Telehealth: Payer: Self-pay | Admitting: Family Medicine

## 2023-05-26 DIAGNOSIS — Z0000 Encounter for general adult medical examination without abnormal findings: Secondary | ICD-10-CM

## 2023-05-26 NOTE — Telephone Encounter (Signed)
 Copied from CRM [id number]. Topic: General - Other >> May 26, 2023  3:54 PM Adrionna Y wrote: Reason for CRM: Patient is still waiting on [facility] to let her know if her information was received. They are not answering their phones so she stated she may not have her labs done for tomorrow but if necessary she can do them later in the day or Wednesday

## 2023-05-26 NOTE — Telephone Encounter (Signed)
 Called pt was advised lab has been ordered and order placed.

## 2023-05-27 ENCOUNTER — Encounter: Payer: Self-pay | Admitting: Family Medicine

## 2023-05-27 ENCOUNTER — Ambulatory Visit (INDEPENDENT_AMBULATORY_CARE_PROVIDER_SITE_OTHER): Payer: MEDICAID | Admitting: Family Medicine

## 2023-05-27 VITALS — BP 138/84 | HR 104 | Temp 98.0°F | Resp 16 | Ht 68.0 in | Wt 268.0 lb

## 2023-05-27 DIAGNOSIS — G8929 Other chronic pain: Secondary | ICD-10-CM

## 2023-05-27 DIAGNOSIS — L308 Other specified dermatitis: Secondary | ICD-10-CM | POA: Diagnosis not present

## 2023-05-27 DIAGNOSIS — K5900 Constipation, unspecified: Secondary | ICD-10-CM

## 2023-05-27 DIAGNOSIS — L989 Disorder of the skin and subcutaneous tissue, unspecified: Secondary | ICD-10-CM | POA: Diagnosis not present

## 2023-05-27 DIAGNOSIS — M545 Low back pain, unspecified: Secondary | ICD-10-CM

## 2023-05-27 DIAGNOSIS — Z0000 Encounter for general adult medical examination without abnormal findings: Secondary | ICD-10-CM

## 2023-05-27 MED ORDER — TRIAMCINOLONE ACETONIDE 0.1 % EX CREA
1.0000 | TOPICAL_CREAM | Freq: Two times a day (BID) | CUTANEOUS | 0 refills | Status: DC
Start: 2023-05-27 — End: 2023-08-05

## 2023-05-27 MED ORDER — LEVOCETIRIZINE DIHYDROCHLORIDE 5 MG PO TABS
5.0000 mg | ORAL_TABLET | Freq: Every evening | ORAL | 2 refills | Status: DC
Start: 2023-05-27 — End: 2023-07-31

## 2023-05-27 MED ORDER — FLUTICASONE PROPIONATE 50 MCG/ACT NA SUSP: 2.0000 | Freq: Every day | NASAL | 6 refills | Status: DC

## 2023-05-27 MED ORDER — METHOCARBAMOL 750 MG PO TABS: 750.0000 mg | ORAL_TABLET | Freq: Three times a day (TID) | ORAL | 0 refills | Status: DC | PRN

## 2023-05-27 NOTE — Patient Instructions (Addendum)
 Keep the diet clean and stay active.  Please get me a copy of your advanced directive form at your convenience.   Please consider adding some weight resistance exercise to your routine. Consider yoga as well.   If you do not hear anything about your referral in the next 1-2 weeks, call our office and ask for an update.  Try 2 tablespoons of milk of mag in 4 oz of warm prune juice. Do that and wait a couple hours. If no improvement, try a Dulcolax suppository and then let me know if we are still having issues.   Heat (pad or rice pillow in microwave) over affected area, 10-15 minutes twice daily.   Ice/cold pack over area for 10-15 min twice daily.  OK to take Tylenol  1000 mg (2 extra strength tabs) or 975 mg (3 regular strength tabs) every 6 hours as needed.  Let us  know if you need anything.  EXERCISES  RANGE OF MOTION (ROM) AND STRETCHING EXERCISES - Low Back Pain Most people with lower back pain will find that their symptoms get worse with excessive bending forward (flexion) or arching at the lower back (extension). The exercises that will help resolve your symptoms will focus on the opposite motion.  If you have pain, numbness or tingling which travels down into your buttocks, leg or foot, the goal of the therapy is for these symptoms to move closer to your back and eventually resolve. Sometimes, these leg symptoms will get better, but your lower back pain may worsen. This is often an indication of progress in your rehabilitation. Be very alert to any changes in your symptoms and the activities in which you participated in the 24 hours prior to the change. Sharing this information with your caregiver will allow him or her to most efficiently treat your condition. These exercises may help you when beginning to rehabilitate your injury. Your symptoms may resolve with or without further involvement from your physician, physical therapist or athletic trainer. While completing these exercises,  remember:  Restoring tissue flexibility helps normal motion to return to the joints. This allows healthier, less painful movement and activity. An effective stretch should be held for at least 30 seconds. A stretch should never be painful. You should only feel a gentle lengthening or release in the stretched tissue. FLEXION RANGE OF MOTION AND STRETCHING EXERCISES:  STRETCH - Flexion, Single Knee to Chest  Lie on a firm bed or floor with both legs extended in front of you. Keeping one leg in contact with the floor, bring your opposite knee to your chest. Hold your leg in place by either grabbing behind your thigh or at your knee. Pull until you feel a gentle stretch in your low back. Hold 30 seconds. Slowly release your grasp and repeat the exercise with the opposite side. Repeat 2 times. Complete this exercise 3 times per week.   STRETCH - Flexion, Double Knee to Chest Lie on a firm bed or floor with both legs extended in front of you. Keeping one leg in contact with the floor, bring your opposite knee to your chest. Tense your stomach muscles to support your back and then lift your other knee to your chest. Hold your legs in place by either grabbing behind your thighs or at your knees. Pull both knees toward your chest until you feel a gentle stretch in your low back. Hold 30 seconds. Tense your stomach muscles and slowly return one leg at a time to the floor. Repeat 2  times. Complete this exercise 3 times per week.   STRETCH - Low Trunk Rotation Lie on a firm bed or floor. Keeping your legs in front of you, bend your knees so they are both pointed toward the ceiling and your feet are flat on the floor. Extend your arms out to the side. This will stabilize your upper body by keeping your shoulders in contact with the floor. Gently and slowly drop both knees together to one side until you feel a gentle stretch in your low back. Hold for 30 seconds. Tense your stomach muscles to support your  lower back as you bring your knees back to the starting position. Repeat the exercise to the other side. Repeat 2 times. Complete this exercise at least 3 times per week.   EXTENSION RANGE OF MOTION AND FLEXIBILITY EXERCISES:  STRETCH - Extension, Prone on Elbows  Lie on your stomach on the floor, a bed will be too soft. Place your palms about shoulder width apart and at the height of your head. Place your elbows under your shoulders. If this is too painful, stack pillows under your chest. Allow your body to relax so that your hips drop lower and make contact more completely with the floor. Hold this position for 30 seconds. Slowly return to lying flat on the floor. Repeat 2 times. Complete this exercise 3 times per week.   RANGE OF MOTION - Extension, Prone Press Ups Lie on your stomach on the floor, a bed will be too soft. Place your palms about shoulder width apart and at the height of your head. Keeping your back as relaxed as possible, slowly straighten your elbows while keeping your hips on the floor. You may adjust the placement of your hands to maximize your comfort. As you gain motion, your hands will come more underneath your shoulders. Hold this position 30 seconds. Slowly return to lying flat on the floor. Repeat 2 times. Complete this exercise 3 times per week.   RANGE OF MOTION- Quadruped, Neutral Spine  Assume a hands and knees position on a firm surface. Keep your hands under your shoulders and your knees under your hips. You may place padding under your knees for comfort. Drop your head and point your tailbone toward the ground below you. This will round out your lower back like an angry cat. Hold this position for 30 seconds. Slowly lift your head and release your tail bone so that your back sags into a large arch, like an old horse. Hold this position for 30 seconds. Repeat this until you feel limber in your low back. Now, find your "sweet spot." This will be the most  comfortable position somewhere between the two previous positions. This is your neutral spine. Once you have found this position, tense your stomach muscles to support your low back. Hold this position for 30 seconds. Repeat 2 times. Complete this exercise 3 times per week.   STRENGTHENING EXERCISES - Low Back Sprain These exercises may help you when beginning to rehabilitate your injury. These exercises should be done near your "sweet spot." This is the neutral, low-back arch, somewhere between fully rounded and fully arched, that is your least painful position. When performed in this safe range of motion, these exercises can be used for people who have either a flexion or extension based injury. These exercises may resolve your symptoms with or without further involvement from your physician, physical therapist or athletic trainer. While completing these exercises, remember:  Muscles can gain both  the endurance and the strength needed for everyday activities through controlled exercises. Complete these exercises as instructed by your physician, physical therapist or athletic trainer. Increase the resistance and repetitions only as guided. You may experience muscle soreness or fatigue, but the pain or discomfort you are trying to eliminate should never worsen during these exercises. If this pain does worsen, stop and make certain you are following the directions exactly. If the pain is still present after adjustments, discontinue the exercise until you can discuss the trouble with your caregiver.  STRENGTHENING - Deep Abdominals, Pelvic Tilt  Lie on a firm bed or floor. Keeping your legs in front of you, bend your knees so they are both pointed toward the ceiling and your feet are flat on the floor. Tense your lower abdominal muscles to press your low back into the floor. This motion will rotate your pelvis so that your tail bone is scooping upwards rather than pointing at your feet or into the  floor. With a gentle tension and even breathing, hold this position for 3 seconds. Repeat 2 times. Complete this exercise 3 times per week.   STRENGTHENING - Abdominals, Crunches  Lie on a firm bed or floor. Keeping your legs in front of you, bend your knees so they are both pointed toward the ceiling and your feet are flat on the floor. Cross your arms over your chest. Slightly tip your chin down without bending your neck. Tense your abdominals and slowly lift your trunk high enough to just clear your shoulder blades. Lifting higher can put excessive stress on the lower back and does not further strengthen your abdominal muscles. Control your return to the starting position. Repeat 2 times. Complete this exercise 3 times per week.   STRENGTHENING - Quadruped, Opposite UE/LE Lift  Assume a hands and knees position on a firm surface. Keep your hands under your shoulders and your knees under your hips. You may place padding under your knees for comfort. Find your neutral spine and gently tense your abdominal muscles so that you can maintain this position. Your shoulders and hips should form a rectangle that is parallel with the floor and is not twisted. Keeping your trunk steady, lift your right hand no higher than your shoulder and then your left leg no higher than your hip. Make sure you are not holding your breath. Hold this position for 30 seconds. Continuing to keep your abdominal muscles tense and your back steady, slowly return to your starting position. Repeat with the opposite arm and leg. Repeat 2 times. Complete this exercise 3 times per week.   STRENGTHENING - Abdominals and Quadriceps, Straight Leg Raise  Lie on a firm bed or floor with both legs extended in front of you. Keeping one leg in contact with the floor, bend the other knee so that your foot can rest flat on the floor. Find your neutral spine, and tense your abdominal muscles to maintain your spinal position throughout the  exercise. Slowly lift your straight leg off the floor about 6 inches for a count of 3, making sure to not hold your breath. Still keeping your neutral spine, slowly lower your leg all the way to the floor. Repeat this exercise with each leg 2 times. Complete this exercise 3 times per week.  POSTURE AND BODY MECHANICS CONSIDERATIONS - Low Back Sprain Keeping correct posture when sitting, standing or completing your activities will reduce the stress put on different body tissues, allowing injured tissues a chance to heal  and limiting painful experiences. The following are general guidelines for improved posture.  While reading these guidelines, remember: The exercises prescribed by your provider will help you have the flexibility and strength to maintain correct postures. The correct posture provides the best environment for your joints to work. All of your joints have less wear and tear when properly supported by a spine with good posture. This means you will experience a healthier, less painful body. Correct posture must be practiced with all of your activities, especially prolonged sitting and standing. Correct posture is as important when doing repetitive low-stress activities (typing) as it is when doing a single heavy-load activity (lifting).  RESTING POSITIONS Consider which positions are most painful for you when choosing a resting position. If you have pain with flexion-based activities (sitting, bending, stooping, squatting), choose a position that allows you to rest in a less flexed posture. You would want to avoid curling into a fetal position on your side. If your pain worsens with extension-based activities (prolonged standing, working overhead), avoid resting in an extended position such as sleeping on your stomach. Most people will find more comfort when they rest with their spine in a more neutral position, neither too rounded nor too arched. Lying on a non-sagging bed on your side with a  pillow between your knees, or on your back with a pillow under your knees will often provide some relief. Keep in mind, being in any one position for a prolonged period of time, no matter how correct your posture, can still lead to stiffness.  PROPER SITTING POSTURE In order to minimize stress and discomfort on your spine, you must sit with correct posture. Sitting with good posture should be effortless for a healthy body. Returning to good posture is a gradual process. Many people can work toward this most comfortably by using various supports until they have the flexibility and strength to maintain this posture on their own. When sitting with proper posture, your ears will fall over your shoulders and your shoulders will fall over your hips. You should use the back of the chair to support your upper back. Your lower back will be in a neutral position, just slightly arched. You may place a small pillow or folded towel at the base of your lower back for  support.  When working at a desk, create an environment that supports good, upright posture. Without extra support, muscles tire, which leads to excessive strain on joints and other tissues. Keep these recommendations in mind:  CHAIR: A chair should be able to slide under your desk when your back makes contact with the back of the chair. This allows you to work closely. The chair's height should allow your eyes to be level with the upper part of your monitor and your hands to be slightly lower than your elbows.  BODY POSITION Your feet should make contact with the floor. If this is not possible, use a foot rest. Keep your ears over your shoulders. This will reduce stress on your neck and low back.  INCORRECT SITTING POSTURES  If you are feeling tired and unable to assume a healthy sitting posture, do not slouch or slump. This puts excessive strain on your back tissues, causing more damage and pain. Healthier options include: Using more support,  like a lumbar pillow. Switching tasks to something that requires you to be upright or walking. Talking a brief walk. Lying down to rest in a neutral-spine position.  PROLONGED STANDING WHILE SLIGHTLY LEANING FORWARD  When completing a task that requires you to lean forward while standing in one place for a long time, place either foot up on a stationary 2-4 inch high object to help maintain the best posture. When both feet are on the ground, the lower back tends to lose its slight inward curve. If this curve flattens (or becomes too large), then the back and your other joints will experience too much stress, tire more quickly, and can cause pain.  CORRECT STANDING POSTURES Proper standing posture should be assumed with all daily activities, even if they only take a few moments, like when brushing your teeth. As in sitting, your ears should fall over your shoulders and your shoulders should fall over your hips. You should keep a slight tension in your abdominal muscles to brace your spine. Your tailbone should point down to the ground, not behind your body, resulting in an over-extended swayback posture.   INCORRECT STANDING POSTURES  Common incorrect standing postures include a forward head, locked knees and/or an excessive swayback. WALKING Walk with an upright posture. Your ears, shoulders and hips should all line-up.  PROLONGED ACTIVITY IN A FLEXED POSITION When completing a task that requires you to bend forward at your waist or lean over a low surface, try to find a way to stabilize 3 out of 4 of your limbs. You can place a hand or elbow on your thigh or rest a knee on the surface you are reaching across. This will provide you more stability, so that your muscles do not tire as quickly. By keeping your knees relaxed, or slightly bent, you will also reduce stress across your lower back. CORRECT LIFTING TECHNIQUES  DO : Assume a wide stance. This will provide you more stability and the  opportunity to get as close as possible to the object which you are lifting. Tense your abdominals to brace your spine. Bend at the knees and hips. Keeping your back locked in a neutral-spine position, lift using your leg muscles. Lift with your legs, keeping your back straight. Test the weight of unknown objects before attempting to lift them. Try to keep your elbows locked down at your sides in order get the best strength from your shoulders when carrying an object.   Always ask for help when lifting heavy or awkward objects. INCORRECT LIFTING TECHNIQUES DO NOT:  Lock your knees when lifting, even if it is a small object. Bend and twist. Pivot at your feet or move your feet when needing to change directions. Assume that you can safely pick up even a paperclip without proper posture.

## 2023-05-27 NOTE — Telephone Encounter (Signed)
 Called spoke with Pt and was advised the labs has been ordered and sent to [facility].  Pt stated she will get them done sometime this week.

## 2023-05-27 NOTE — Progress Notes (Signed)
 Chief Complaint  Patient presents with   Annual Exam    CPE     Well Woman Molly Savage is here for a complete physical.   Her last physical was >1 year ago.  Current diet: in general, diet is OK. Current exercise: walking. Contraception? Yes Fatigue out of ordinary? Not out of ordinary Seatbelt? Yes Advanced directive? No  Health Maintenance Pap/HPV- Yes Tetanus- Yes HIV screening- Yes Hep C screening- Yes  Lesion on LLE for several years. No new lotions, soaps, topicals or detergents. No trauma.  She has not tried anything at home for this.  She also has skin tags on her neck which she wants taken off.  She is requesting a referral to the dermatology team.  Low back pain for 7 mo. Feels like her muscles are being squeezed. Flexeril  was not helpful. No bowel/bladder incontinence.   Patient has trouble with bowel movements.  Coffee and certain stool softeners can sometimes help.  She does stay hydrated overall.  Diet as above.  This will cause abdominal cramping and bloating.  No nausea or vomiting.  She denies any bleeding.  Past Medical History:  Diagnosis Date   Anxiety    Depression    Diabetes mellitus type 1 (HCC)    Follows with Endo   GERD (gastroesophageal reflux disease)    Hypertension    Tobacco abuse    Wears glasses      Past Surgical History:  Procedure Laterality Date   CESAREAN SECTION N/A 02/01/2016   Procedure: CESAREAN SECTION;  Surgeon: Verlyn Goad, MD;  Location: [facility];  Service: Obstetrics;  Laterality: N/A;   CESAREAN SECTION     CESAREAN SECTION N/A 04/02/2020   Procedure: CESAREAN SECTION;  Surgeon: Granville Layer, MD;  Location: [facility];  Service: Obstetrics;  Laterality: N/A;   CHOLECYSTECTOMY N/A 06/16/2019   Procedure: LAPAROSCOPIC CHOLECYSTECTOMY;  Surgeon: Derral Flick, MD;  Location: [facility];  Service: General;  Laterality: N/A;   FOOT SURGERY Left    TOOTH EXTRACTION  2018    Medications  Current  Outpatient Medications on File Prior to Visit  Medication Sig Dispense Refill   cetirizine  (ZYRTEC ) 10 MG tablet Take 1 tablet (10 mg total) by mouth daily. 30 tablet 11   labetalol  (NORMODYNE ) 200 MG tablet Take 1 tablet by mouth twice daily 60 tablet 0   NOVOLOG  100 UNIT/ML injection SMARTSIG:0-150 Unit(s) SUB-Q     pantoprazole  (PROTONIX ) 40 MG tablet Take 1 tablet (40 mg total) by mouth 2 (two) times daily before a meal. 180 tablet 2   venlafaxine  XR (EFFEXOR -XR) 150 MG 24 hr capsule TAKE 1 CAPSULE BY MOUTH ONCE DAILY WITH BREAKFAST 30 capsule 0   Allergies No Known Allergies  Review of Systems: Constitutional:  no unexpected weight changes Eye:  no recent significant change in vision Ear/Nose/Mouth/Throat:  Ears:  no tinnitus or vertigo and no recent change in hearing Nose/Mouth/Throat:  no complaints of nasal congestion, no sore throat Cardiovascular: no chest pain Respiratory:  no cough and no shortness of breath Gastrointestinal:  no abdominal pain, +constipation GU:  Female: negative for dysuria or pelvic pain Musculoskeletal/Extremities:  +LBP Integumentary (Skin/Breast):  +lesion on  Neurologic:  no headaches Endocrine:  denies fatigue Hematologic/Lymphatic:  No areas of easy bleeding  Exam BP 138/84 (BP Location: Left Arm, Patient Position: Sitting)   Pulse (!) 104   Temp 98 F (36.7 C) (Oral)   Resp 16   Ht 5\' 8"  (1.727 m)  Wt 268 lb (121.6 kg)   SpO2 98%   BMI 40.75 kg/m  General:  well developed, well nourished, in no apparent distress Skin: Scaly and slightly excoriated patch over the left lower extremity anteriorly.  Flushing hyperpigmented circular lesion over the posterior lateral neck on the right.  There is no fluctuance, drainage, scaling, TTP, or erythema.  Otherwise, no significant moles, warts, or growths Head:  no masses, lesions, or tenderness Eyes:  pupils equal and round, sclera anicteric without injection Ears:  canals without lesions, TMs shiny  without retraction, no obvious effusion, no erythema Nose:  nares patent, mucosa normal, and no drainage  Throat/Pharynx:  lips and gingiva without lesion; tongue and uvula midline; non-inflamed pharynx; no exudates or postnasal drainage Neck: neck supple without adenopathy, thyromegaly, or masses Lungs:  clear to auscultation, breath sounds equal bilaterally, no respiratory distress Cardio:  regular rate and rhythm, no bruits, no LE edema Abdomen:  abdomen soft, nontender; bowel sounds normal Genital: Defer to GYN Musculoskeletal: TTP in the lumbar paraspinal musculature region.  Symmetrical muscle groups noted without atrophy or deformity Extremities:  no clubbing, cyanosis, or edema, no deformities, no skin discoloration Neuro:  gait normal; deep tendon reflexes normal and symmetric, no clonus, no cerebellar signs Psych: well oriented with normal range of affect and appropriate judgment/insight  Assessment and Plan  Well adult exam  Pruritic dermatitis - Plan: triamcinolone  cream (KENALOG ) 0.1 %  Skin lesions - Plan: Ambulatory referral to Dermatology  Chronic bilateral low back pain without sciatica - Plan: methocarbamol (ROBAXIN) 750 MG tablet  Constipation, unspecified constipation type   Well 33 y.o. female. Counseled on diet and exercise. Advanced directive form provided today.  Chronic issue, not controlled.  Trial of triamcinolone  0.1% twice daily for 2 weeks.  Avoid scented products.  Refer to dermatology for likely skin tags. Chronic low back pain: Chronic, not controlled.  Stretches and exercises, heat, ice, Tylenol , Robaxin as needed.  Consider physical therapy if no improvement. Constipation: Has failed various over-the-counter options.  Trial of milk of magnesia with 4 ounces of prune juice.  Consider suppository.  If not improving, would consider referral to GI and trial of Reglan .  She is an uncontrolled type I diabetic. Other orders as above. Follow up 1 week to  discuss sleep issues. The patient voiced understanding and agreement to the plan.  Shellie Dials [place], DO 05/27/23 3:39 PM

## 2023-06-01 ENCOUNTER — Other Ambulatory Visit: Payer: Self-pay | Admitting: Family Medicine

## 2023-06-03 ENCOUNTER — Encounter: Payer: Self-pay | Admitting: Family Medicine

## 2023-06-03 ENCOUNTER — Telehealth (INDEPENDENT_AMBULATORY_CARE_PROVIDER_SITE_OTHER): Payer: MEDICAID | Admitting: Family Medicine

## 2023-06-03 DIAGNOSIS — G4700 Insomnia, unspecified: Secondary | ICD-10-CM | POA: Diagnosis not present

## 2023-06-03 MED ORDER — QUETIAPINE FUMARATE 25 MG PO TABS
25.0000 mg | ORAL_TABLET | Freq: Every day | ORAL | 1 refills | Status: DC
Start: 2023-06-03 — End: 2023-06-17

## 2023-06-03 NOTE — Progress Notes (Signed)
 Chief Complaint  Patient presents with   Follow-up    Follow up    Subjective: Patient is a 33 y.o. female here for insomnia. We are interacting via web portal for an electronic face-to-face visit. I verified patient's ID using 2 identifiers. Patient agreed to proceed with visit via this method. Patient is at home, I am at office. Patient and I are present for visit.   This has been going on for years. Has trouble falling asleep and staying asleep. Takes her around 1 hr to fall asleep. Her fiancees alarm clock will wake her up around 330-4 AM and it takes her 30-60 more minutes and sleep for 2 more hours after that before her baby wakes up. She will nap midday for around 1 hr. She does not feel well-rested. +hx of anxiety/depression. She does have racing thoughts. She is not breast feeding. No current plans to get pregnant.    Past Medical History:  Diagnosis Date   Anxiety    Depression    Diabetes mellitus type 1 (HCC)    Follows with Endo   GERD (gastroesophageal reflux disease)    Hypertension    Tobacco abuse    Wears glasses     Objective: No conversational dyspnea Age appropriate judgment and insight Nml affect and mood  Assessment and Plan: Insomnia, unspecified type - Plan: QUEtiapine (SEROQUEL) 25 MG tablet  Chronic, uncontrolled. Likely GAD component. Cont Effexor  XR 150 mg/d. Add Seroquel 25 mg qhs prn. She is not pregnant and not planning on it in the near future. Rec'd holding off on this in the near future.  F/u in 1 mo. Consider counseling.  The patient voiced understanding and agreement to the plan.  Shellie Dials [place], DO 06/03/23  2:29 PM

## 2023-06-04 ENCOUNTER — Telehealth: Payer: Self-pay | Admitting: Family Medicine

## 2023-06-04 NOTE — Telephone Encounter (Signed)
 Called pt was advised it would need to discuss in a visit for her insurance  to cover it. Appt scheduled.

## 2023-06-04 NOTE — Telephone Encounter (Signed)
 Copied from CRM [id number]. Topic: General - Other >> Jun 04, 2023 12:17 PM Molly Savage S wrote: Reason for CRM: Pt is needing a extra large bedside commode if possible

## 2023-06-06 ENCOUNTER — Telehealth (INDEPENDENT_AMBULATORY_CARE_PROVIDER_SITE_OTHER): Payer: MEDICAID | Admitting: Family Medicine

## 2023-06-06 ENCOUNTER — Telehealth: Payer: Self-pay

## 2023-06-06 ENCOUNTER — Encounter: Payer: Self-pay | Admitting: Family Medicine

## 2023-06-06 DIAGNOSIS — N393 Stress incontinence (female) (male): Secondary | ICD-10-CM | POA: Diagnosis not present

## 2023-06-06 DIAGNOSIS — Z720 Tobacco use: Secondary | ICD-10-CM

## 2023-06-06 DIAGNOSIS — R61 Generalized hyperhidrosis: Secondary | ICD-10-CM | POA: Diagnosis not present

## 2023-06-06 DIAGNOSIS — H9193 Unspecified hearing loss, bilateral: Secondary | ICD-10-CM

## 2023-06-06 DIAGNOSIS — R159 Full incontinence of feces: Secondary | ICD-10-CM

## 2023-06-06 MED ORDER — ALUMINUM CHLORIDE 20 % EX SOLN: Freq: Every day | CUTANEOUS | 1 refills | Status: DC

## 2023-06-06 MED ORDER — VARENICLINE TARTRATE (STARTER) 0.5 MG X 11 & 1 MG X 42 PO TBPK: ORAL_TABLET | ORAL | 0 refills | Status: DC

## 2023-06-06 NOTE — Progress Notes (Signed)
 CC: Incontinence  Subjective: Patient is a 33 y.o. female here for incontinence. We are interacting via web portal for an electronic face-to-face visit. I verified patient's ID using 2 identifiers. Patient agreed to proceed with visit via this method. Patient is at home, I am at office. Patient and I are present for visit.   Patient has a several year history of stress incontinence.  She tried Kegel exercises with little relief.  She is living in a house but with only 1 bathroom.  Sometimes in the melanite she will wake up and have difficulty holding both stool and urine.  Stool incontinence has been going on off and on for the past several years.  She think she has a history of gastroparesis.  She is requesting a referral to the gastroenterology team.  She is also requesting a bedside commode to help with incontinence episodes and accidents in the middle of the night.  Patient continues to smoke daily.  She never took Chantix  because she worried it would worsen her depression.  Patient has a several year history of diffuse and profuse sweating.  This caused her to feel off nicotine  patches and lose her glucose sensors.  She has never tried anything thus far.  The sweating is out of proportion to the temperature.  Past Medical History:  Diagnosis Date   Anxiety    Depression    Diabetes mellitus type 1 (HCC)    Follows with Endo   GERD (gastroesophageal reflux disease)    Hypertension    Tobacco abuse    Wears glasses     Objective: No conversational dyspnea Age appropriate judgment and insight Nml affect and mood  Assessment and Plan: Stress incontinence - Plan: Ambulatory referral to Physical Therapy  Incontinence of feces, unspecified fecal incontinence type - Plan: For home use only DME Other see comment  Hyperhidrosis - Plan: aluminum chloride (DRYSOL) 20 % external solution  Tobacco abuse - Plan: Varenicline  Tartrate, Starter, (CHANTIX  STARTING MONTH PAK) 0.5 MG X 11 & 1 MG X  42 TBPK  Bilateral hearing loss, unspecified hearing loss type - Plan: Ambulatory referral to Audiology  Pelvic floor PT rec'd. Bedside commode to help with home situation.  Daily fiber rec'd. Refer GI. Bedside commode ordered so she does not have accidents at home.  Chronic, unstable. Start Drysol daily.  Chronic, unstable. Start Chantix  daily. Counseled on cessation.  F/u in 6 weeks to recheck.  The patient voiced understanding and agreement to the plan.  Shellie Dials [place], DO 06/06/23  3:54 PM

## 2023-06-06 NOTE — Telephone Encounter (Signed)
 DME order for bedside commode placed and community message sent to [employer]. Cc'd provider's CMA in message

## 2023-06-08 ENCOUNTER — Other Ambulatory Visit: Payer: Self-pay | Admitting: Family Medicine

## 2023-06-08 ENCOUNTER — Encounter: Payer: Self-pay | Admitting: Family Medicine

## 2023-06-09 ENCOUNTER — Other Ambulatory Visit: Payer: Self-pay | Admitting: Family Medicine

## 2023-06-10 ENCOUNTER — Telehealth: Payer: Self-pay | Admitting: *Deleted

## 2023-06-10 ENCOUNTER — Other Ambulatory Visit: Payer: Self-pay | Admitting: Family Medicine

## 2023-06-10 DIAGNOSIS — E1040 Type 1 diabetes mellitus with diabetic neuropathy, unspecified: Secondary | ICD-10-CM

## 2023-06-10 NOTE — Telephone Encounter (Signed)
 Copied from CRM [id number]. Topic: Clinical - Order For Equipment >> Jun 10, 2023  3:12 PM Molly Savage wrote: Pt received a commode however the commode that she needs is a bariatric commode. She has the regular commode in home but has not been used as its too small. If possible a order can be sent to [facility]-  located at [address] [place], [place] [address]   [phone]. However she needs assistance with returning the commode that was delivered to her because she does not want to have an issue with [employer] not covering another. . She no longer has the company information.

## 2023-06-11 NOTE — Telephone Encounter (Signed)
 Message sent for Molly Savage to help get number information for bedside commode.

## 2023-06-13 ENCOUNTER — Other Ambulatory Visit: Payer: Self-pay | Admitting: Family Medicine

## 2023-06-13 DIAGNOSIS — K219 Gastro-esophageal reflux disease without esophagitis: Secondary | ICD-10-CM

## 2023-06-17 ENCOUNTER — Other Ambulatory Visit: Payer: Self-pay

## 2023-06-17 ENCOUNTER — Ambulatory Visit: Payer: Self-pay | Admitting: Family Medicine

## 2023-06-17 ENCOUNTER — Ambulatory Visit (INDEPENDENT_AMBULATORY_CARE_PROVIDER_SITE_OTHER): Payer: MEDICAID | Admitting: Family Medicine

## 2023-06-17 ENCOUNTER — Encounter: Payer: Self-pay | Admitting: Family Medicine

## 2023-06-17 VITALS — BP 134/80 | HR 92 | Temp 98.0°F | Resp 16 | Ht 68.0 in | Wt 269.0 lb

## 2023-06-17 DIAGNOSIS — R413 Other amnesia: Secondary | ICD-10-CM

## 2023-06-17 DIAGNOSIS — G4700 Insomnia, unspecified: Secondary | ICD-10-CM

## 2023-06-17 DIAGNOSIS — F5089 Other specified eating disorder: Secondary | ICD-10-CM

## 2023-06-17 DIAGNOSIS — D72818 Other decreased white blood cell count: Secondary | ICD-10-CM

## 2023-06-17 DIAGNOSIS — G5603 Carpal tunnel syndrome, bilateral upper limbs: Secondary | ICD-10-CM

## 2023-06-17 LAB — COMPREHENSIVE METABOLIC PANEL WITH GFR
ALT: 14 U/L (ref 0–35)
AST: 13 U/L (ref 0–37)
Albumin: 4.1 g/dL (ref 3.5–5.2)
Alkaline Phosphatase: 103 U/L (ref 39–117)
BUN: 12 mg/dL (ref 6–23)
CO2: 24 meq/L (ref 19–32)
Calcium: 9.3 mg/dL (ref 8.4–10.5)
Chloride: 102 meq/L (ref 96–112)
Creatinine, Ser: 0.74 mg/dL (ref 0.40–1.20)
GFR: 105.35 mL/min (ref >60.00)
Glucose, Bld: 275 mg/dL — ABNORMAL HIGH (ref 70–99)
Potassium: 4.7 meq/L (ref 3.5–5.1)
Sodium: 134 meq/L — ABNORMAL LOW (ref 135–145)
Total Bilirubin: 0.3 mg/dL (ref 0.2–1.2)
Total Protein: 6.8 g/dL (ref 6.0–8.3)

## 2023-06-17 LAB — CBC
HCT: 34.3 % — ABNORMAL LOW (ref 36.0–46.0)
Hemoglobin: 10.9 g/dL — ABNORMAL LOW (ref 12.0–15.0)
MCHC: 31.7 g/dL (ref 30.0–36.0)
MCV: 72.2 fl — ABNORMAL LOW (ref 78.0–100.0)
Platelets: 247 10*3/uL (ref 150.0–400.0)
RBC: 4.75 Mil/uL (ref 3.87–5.11)
RDW: 18.9 % — ABNORMAL HIGH (ref 11.5–15.5)
WBC: 9.6 10*3/uL (ref 4.0–10.5)

## 2023-06-17 LAB — TSH: TSH: 1.36 u[IU]/mL (ref 0.35–5.50)

## 2023-06-17 LAB — IBC + FERRITIN
Ferritin: 6.9 ng/mL — ABNORMAL LOW (ref 10.0–291.0)
Iron: 21 ug/dL — ABNORMAL LOW (ref 42–145)
Saturation Ratios: 4.1 % — ABNORMAL LOW (ref 20.0–50.0)
TIBC: 508.2 ug/dL — ABNORMAL HIGH (ref 250.0–450.0)
Transferrin: 363 mg/dL — ABNORMAL HIGH (ref 212.0–360.0)

## 2023-06-17 LAB — VITAMIN B12: Vitamin B-12: 674 pg/mL (ref 211–911)

## 2023-06-17 MED ORDER — RISPERIDONE 0.5 MG PO TABS: 0.5000 mg | ORAL_TABLET | Freq: Every day | ORAL | 1 refills | Status: DC

## 2023-06-17 NOTE — Patient Instructions (Addendum)
 Give us  2-3 business days to get the results of your labs back.   Keep the diet clean and stay active.  Heat (pad or rice pillow in microwave) over affected area, 10-15 minutes twice daily.   Ice/cold pack over area for 10-15 min twice daily.  OK to take Tylenol  1000 mg (2 extra strength tabs) or 975 mg (3 regular strength tabs) every 6 hours as needed.  Wear your wrist splints at night and during aggravating activities.   Let us  know if you need anything.  Hand Exercises Hand exercises can be helpful for almost anyone. These exercises can strengthen the hands, improve flexibility and movement, and increase blood flow to the hands. These results can make work and daily tasks easier. Hand exercises can be especially helpful for people who have joint pain from arthritis or have nerve damage from overuse (carpal tunnel syndrome). These exercises can also help people who have injured a hand. Exercises Most of these hand exercises are gentle stretching and motion exercises. It is usually safe to do them often throughout the day. Warming up your hands before exercise may help to reduce stiffness. You can do this with gentle massage or by placing your hands in warm water  for 10-15 minutes. It is normal to feel some stretching, pulling, tightness, or mild discomfort as you begin new exercises. This will gradually improve. Stop an exercise right away if you feel sudden, severe pain or your pain gets worse. Ask your health care provider which exercises are best for you. Knuckle bend or "claw" fist Stand or sit with your arm, hand, and all five fingers pointed straight up. Make sure to keep your wrist straight during the exercise. Gently bend your fingers down toward your palm until the tips of your fingers are touching the top of your palm. Keep your big knuckle straight and just bend the small knuckles in your fingers. Hold this position for 3 seconds. Straighten (extend) your fingers back to the  starting position. Repeat this exercise 5-10 times with each hand. Full finger fist Stand or sit with your arm, hand, and all five fingers pointed straight up. Make sure to keep your wrist straight during the exercise. Gently bend your fingers into your palm until the tips of your fingers are touching the middle of your palm. Hold this position for 3 seconds. Extend your fingers back to the starting position, stretching every joint fully. Repeat this exercise 5-10 times with each hand. Straight fist Stand or sit with your arm, hand, and all five fingers pointed straight up. Make sure to keep your wrist straight during the exercise. Gently bend your fingers at the big knuckle, where your fingers meet your hand, and the middle knuckle. Keep the knuckle at the tips of your fingers straight and try to touch the bottom of your palm. Hold this position for 3 seconds. Extend your fingers back to the starting position, stretching every joint fully. Repeat this exercise 5-10 times with each hand. Tabletop Stand or sit with your arm, hand, and all five fingers pointed straight up. Make sure to keep your wrist straight during the exercise. Gently bend your fingers at the big knuckle, where your fingers meet your hand, as far down as you can while keeping the small knuckles in your fingers straight. Think of forming a tabletop with your fingers. Hold this position for 3 seconds. Extend your fingers back to the starting position, stretching every joint fully. Repeat this exercise 5-10 times with each hand.  Finger spread Place your hand flat on a table with your palm facing down. Make sure your wrist stays straight as you do this exercise. Spread your fingers and thumb apart from each other as far as you can until you feel a gentle stretch. Hold this position for 3 seconds. Bring your fingers and thumb tight together again. Hold this position for 3 seconds. Repeat this exercise 5-10 times with each  hand. Making circles Stand or sit with your arm, hand, and all five fingers pointed straight up. Make sure to keep your wrist straight during the exercise. Make a circle by touching the tip of your thumb to the tip of your index finger. Hold for 3 seconds. Then open your hand wide. Repeat this motion with your thumb and each finger on your hand. Repeat this exercise 5-10 times with each hand. Thumb motion Sit with your forearm resting on a table and your wrist straight. Your thumb should be facing up toward the ceiling. Keep your fingers relaxed as you move your thumb. Lift your thumb up as high as you can toward the ceiling. Hold for 3 seconds. Bend your thumb across your palm as far as you can, reaching the tip of your thumb for the small finger (pinkie) side of your palm. Hold for 3 seconds. Repeat this exercise 5-10 times with each hand. Grip strengthening  Hold a stress ball or other soft ball in the middle of your hand. Slowly increase the pressure, squeezing the ball as much as you can without causing pain. Think of bringing the tips of your fingers into the middle of your palm. All of your finger joints should bend when doing this exercise. Hold your squeeze for 3 seconds, then relax. Repeat this exercise 5-10 times with each hand. Contact a health care provider if: Your hand pain or discomfort gets much worse when you do an exercise. Your hand pain or discomfort does not improve within 2 hours after you exercise. If you have any of these problems, stop doing these exercises right away. Do not do them again unless your health care provider says that you can. Get help right away if: You develop sudden, severe hand pain or swelling. If this happens, stop doing these exercises right away. Do not do them again unless your health care provider says that you can. Make sure you discuss any questions you have with your health care provider. Document Revised: 04/30/2018 Document Reviewed:  01/08/2018 Elsevier Patient Education  2020 [employer].
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
## 2023-06-23 LAB — HM DIABETES EYE EXAM

## 2023-06-27 ENCOUNTER — Telehealth: Payer: Self-pay | Admitting: Family Medicine

## 2023-06-27 NOTE — Telephone Encounter (Signed)
 Copied from CRM [id number]. Topic: Referral - Status >> Jun 27, 2023  1:59 PM Albertha Alosa wrote: Reason for CRM: Isa Manuel from [facility] called to inform Dr.Wendling that they do not do Pelvic floor physical therapy at that location

## 2023-07-04 ENCOUNTER — Encounter (INDEPENDENT_AMBULATORY_CARE_PROVIDER_SITE_OTHER): Payer: MEDICAID | Admitting: Family Medicine

## 2023-07-04 DIAGNOSIS — L304 Erythema intertrigo: Secondary | ICD-10-CM

## 2023-07-04 MED ORDER — NYSTATIN 100000 UNIT/GM EX POWD: 1.0000 | Freq: Three times a day (TID) | CUTANEOUS | 0 refills | Status: DC

## 2023-07-04 NOTE — Telephone Encounter (Signed)

## 2023-07-06 ENCOUNTER — Other Ambulatory Visit: Payer: Self-pay | Admitting: Family

## 2023-07-14 ENCOUNTER — Other Ambulatory Visit: Payer: MEDICAID

## 2023-07-18 ENCOUNTER — Telehealth: Payer: Self-pay

## 2023-07-18 NOTE — Telephone Encounter (Signed)
 Copied from CRM [id number]. Topic: General - Other >> Jul 18, 2023  2:53 PM Thersia BROCKS wrote:  Reason for CRM: [facility] called in regarding patient referral , stated they received it and do not offer pelvis floor therapy at that clinic

## 2023-07-18 NOTE — Telephone Encounter (Signed)
 Referral sent to: [facility] [address] [place], [place] [address] Phone: [phone]

## 2023-07-21 NOTE — Telephone Encounter (Signed)
 Referral request sent to referral team.

## 2023-07-31 ENCOUNTER — Encounter: Payer: Self-pay | Admitting: Family Medicine

## 2023-07-31 ENCOUNTER — Other Ambulatory Visit: Payer: Self-pay | Admitting: Family Medicine

## 2023-08-02 ENCOUNTER — Encounter: Payer: Self-pay | Admitting: Family Medicine

## 2023-08-04 ENCOUNTER — Other Ambulatory Visit: Payer: Self-pay

## 2023-08-04 ENCOUNTER — Ambulatory Visit: Payer: MEDICAID | Admitting: Family Medicine

## 2023-08-04 DIAGNOSIS — R61 Generalized hyperhidrosis: Secondary | ICD-10-CM

## 2023-08-04 MED ORDER — ALUMINUM CHLORIDE 20 % EX SOLN: Freq: Every day | CUTANEOUS | 1 refills | Status: DC

## 2023-08-05 ENCOUNTER — Other Ambulatory Visit: Payer: Self-pay | Admitting: Family Medicine

## 2023-08-05 ENCOUNTER — Telehealth (INDEPENDENT_AMBULATORY_CARE_PROVIDER_SITE_OTHER): Payer: MEDICAID | Admitting: Family Medicine

## 2023-08-05 DIAGNOSIS — E1040 Type 1 diabetes mellitus with diabetic neuropathy, unspecified: Secondary | ICD-10-CM

## 2023-08-05 DIAGNOSIS — Z720 Tobacco use: Secondary | ICD-10-CM

## 2023-08-05 DIAGNOSIS — L308 Other specified dermatitis: Secondary | ICD-10-CM

## 2023-08-05 DIAGNOSIS — F3181 Bipolar II disorder: Secondary | ICD-10-CM

## 2023-08-05 MED ORDER — TRIAMCINOLONE ACETONIDE 0.1 % EX CREA: 1.0000 | TOPICAL_CREAM | Freq: Two times a day (BID) | CUTANEOUS | 0 refills | Status: DC

## 2023-08-05 MED ORDER — BUPROPION HCL ER (SMOKING DET) 150 MG PO TB12: ORAL_TABLET | ORAL | 0 refills | Status: DC

## 2023-08-05 NOTE — Progress Notes (Signed)
 Chief Complaint  Patient presents with   Fatigue    Fatigue    Subjective: Patient is a 32 y.o. female here for fatigue. We are interacting via web portal for an electronic face-to-face visit. I verified patient's ID using 2 identifiers. Patient agreed to proceed with visit via this method. Patient is at home, I am at office. Patient and I are present for visit.   Sleeping 3.5-5 hrs nightly. Does not feel well rested. She has trouble falling asleep. Diet could be better. She does some walking. No bleeding, diarrhea. Sometimes will have N/V. No dysphagia. Mood could be better.   1.5-2 ppd currently. Just failed Chantix . She had increased fatigue and vivid/strange dreams. Was on bupropion  in the past and stopped when she got pregnant. She will find out if she is pregnant again later in the week. Patches were helpful for a couple hrs.   Past Medical History:  Diagnosis Date   Anxiety    Depression    Diabetes mellitus type 1 (HCC)    Follows with Endo   GERD (gastroesophageal reflux disease)    Hypertension    Tobacco abuse    Wears glasses     Objective: No conversational dyspnea Age appropriate judgment and insight Nml affect and mood  Assessment and Plan: Bipolar 2 disorder (HCC)  Tobacco abuse - Plan: buPROPion  (ZYBAN ) 150 MG 12 hr tablet  Pruritic dermatitis - Plan: triamcinolone  cream (KENALOG ) 0.1 %  Chronic, not stable. Could be related to both mood and sleep. They may be related. Hasn't started Risperdal . Will wait until she confirms she is not pregnant and then start at night. Hopefully this helps with both mood and sleep which will help her fatigue. F/u in 1 mo.  Chronic, uncontrolled. Failed Chantix  and various nicotine  supplementation. Start Zyban  1 week prior to quit date, 150 mg/d for 3 d and then bid. The patient voiced understanding and agreement to the plan.  Mabel Mt [place], DO 08/05/23  2:27 PM

## 2023-08-07 ENCOUNTER — Telehealth: Payer: Self-pay

## 2023-08-07 ENCOUNTER — Other Ambulatory Visit: Payer: Self-pay | Admitting: Family Medicine

## 2023-08-07 NOTE — Telephone Encounter (Signed)
 Spoke w/ Pt- we scheduled her 1 month f/u, she wanted to also let PCP know she started her menstrual period yesterday.

## 2023-08-07 NOTE — Telephone Encounter (Signed)
 Copied from CRM [id number]. Topic: Clinical - Medication Question >> Aug 07, 2023 11:40 AM Viola F wrote: Reason for CRM: Patient wants to know if she can be prescribed a supplement for metabolism since she feels like her metabolism/energy is low. She requested a call back from Dr. Freya nurse. Please call her at [phone] (M)

## 2023-08-08 ENCOUNTER — Other Ambulatory Visit: Payer: Self-pay | Admitting: Family Medicine

## 2023-08-08 DIAGNOSIS — M545 Low back pain, unspecified: Secondary | ICD-10-CM

## 2023-08-11 ENCOUNTER — Ambulatory Visit: Payer: MEDICAID | Admitting: Podiatry

## 2023-08-16 ENCOUNTER — Other Ambulatory Visit: Payer: Self-pay | Admitting: Family Medicine

## 2023-08-16 DIAGNOSIS — G4700 Insomnia, unspecified: Secondary | ICD-10-CM

## 2023-08-28 ENCOUNTER — Ambulatory Visit: Payer: MEDICAID | Admitting: Podiatry

## 2023-08-30 ENCOUNTER — Other Ambulatory Visit: Payer: Self-pay | Admitting: Family Medicine

## 2023-08-30 DIAGNOSIS — E1040 Type 1 diabetes mellitus with diabetic neuropathy, unspecified: Secondary | ICD-10-CM

## 2023-08-30 DIAGNOSIS — Z720 Tobacco use: Secondary | ICD-10-CM

## 2023-09-01 ENCOUNTER — Telehealth: Payer: Self-pay

## 2023-09-01 NOTE — Telephone Encounter (Signed)
 Copied from CRM #[id number]. 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 8/18. Please reach out to pt with status of this. Pt states its ok to communicate through MyChart  Lvm we can do it at her visit on the 8/18.

## 2023-09-02 ENCOUNTER — Encounter: Payer: Self-pay | Admitting: Family Medicine

## 2023-09-08 ENCOUNTER — Ambulatory Visit: Payer: MEDICAID | Admitting: Family Medicine

## 2023-09-09 ENCOUNTER — Encounter: Payer: Self-pay | Admitting: Family Medicine

## 2023-09-09 NOTE — Telephone Encounter (Signed)
 See below

## 2023-09-10 ENCOUNTER — Ambulatory Visit: Payer: MEDICAID | Admitting: Family Medicine

## 2023-09-12 ENCOUNTER — Ambulatory Visit: Payer: MEDICAID | Admitting: Family Medicine

## 2023-09-12 ENCOUNTER — Other Ambulatory Visit: Payer: Self-pay

## 2023-09-12 MED ORDER — IBUPROFEN 800 MG PO TABS: 800.0000 mg | ORAL_TABLET | Freq: Four times a day (QID) | ORAL | 1 refills | Status: DC | PRN

## 2023-09-17 ENCOUNTER — Encounter: Payer: Self-pay | Admitting: Family Medicine

## 2023-09-17 ENCOUNTER — Ambulatory Visit (INDEPENDENT_AMBULATORY_CARE_PROVIDER_SITE_OTHER): Payer: MEDICAID | Admitting: Family Medicine

## 2023-09-17 VITALS — BP 146/92 | HR 78 | Temp 97.8°F | Resp 16 | Ht 68.0 in | Wt 297.0 lb

## 2023-09-17 DIAGNOSIS — Z111 Encounter for screening for respiratory tuberculosis: Secondary | ICD-10-CM | POA: Diagnosis not present

## 2023-09-17 DIAGNOSIS — F3181 Bipolar II disorder: Secondary | ICD-10-CM | POA: Diagnosis not present

## 2023-09-17 DIAGNOSIS — Z720 Tobacco use: Secondary | ICD-10-CM

## 2023-09-17 DIAGNOSIS — D72818 Other decreased white blood cell count: Secondary | ICD-10-CM | POA: Diagnosis not present

## 2023-09-17 MED ORDER — LAMOTRIGINE 25 MG PO TABS: ORAL_TABLET | ORAL | 0 refills | Status: DC

## 2023-09-17 MED ORDER — NICOTINE 21 MG/24HR TD PT24
21.0000 mg | MEDICATED_PATCH | Freq: Every day | TRANSDERMAL | 0 refills | Status: AC
Start: 2023-09-17 — End: 2023-10-29

## 2023-09-17 MED ORDER — NICOTINE 7 MG/24HR TD PT24: 7.0000 mg | MEDICATED_PATCH | Freq: Every day | TRANSDERMAL | 0 refills | Status: DC

## 2023-09-17 MED ORDER — NICOTINE 14 MG/24HR TD PT24: 14.0000 mg | MEDICATED_PATCH | Freq: Every day | TRANSDERMAL | 0 refills | Status: AC

## 2023-09-17 NOTE — Patient Instructions (Signed)
 Keep the diet clean and stay active.  Check your blood pressures 2-3 times per week, alternating the time of day you check it. If it is high, considering waiting 1-2 minutes and rechecking. If it gets higher, your anxiety is likely creeping up and we should avoid rechecking.   [facility] [address] [place], [place] [address] [phone]  [facility] [address] [place], [place] [address] [phone]  [facility] [address] [place], [place] [address] [phone]  [facility] [address], [place], [place], #[phone] [address], [place], [place], #[phone]  [facility] [address] [place], [place] [address] [phone]  [facility] [address] [place], [place] [phone]  [facility] [address] [place], [place] [phone]  [facility] [address] [place], [place]   [phone].   Please go online to complete a form to submit first.  [facility]  [address] [place], [place] [phone].     [facility] -  local practices located at: [address] [place], [place]. [phone]. [address] [place], [place], [place].  [phone]. [address], [place], [place].  [phone].  Contact one of these offices sooner than later as it can take 2-3 months to get a new patient appointment.   Let us  know if you need anything.

## 2023-09-17 NOTE — Progress Notes (Signed)
 Chief Complaint  Patient presents with   Follow-up    Follow Up    Subjective: Patient is a 33 y.o. female here for f/u.  Started on Risperdal  0.5 mg/d. Made her drowsy during the day. Failed Zoloft , Abilify  and Seroquel . Is not currently seeing a therapist or psychiatrist. No HI or SI. No self medication.   She has failed Chantix  and Zyban  for smoking cessation.  Reports patches worked well in the hospital but not outpatient.  She would like to quit smoking.  Past Medical History:  Diagnosis Date   Anxiety    Depression    Diabetes mellitus type 1 (HCC)    Follows with Endo   GERD (gastroesophageal reflux disease)    Hypertension    Tobacco abuse    Wears glasses     Objective: BP (!) 146/92 (BP Location: Left Arm, Patient Position: Sitting)   Pulse 78   Temp 97.8 F (36.6 C) (Oral)   Resp 16   Ht 5' 8 (1.727 m)   Wt 297 lb (134.7 kg)   SpO2 95%   BMI 45.16 kg/m  General: Awake, appears stated age Heart: RRR, no LE edema Lungs: CTAB, no rales, wheezes or rhonchi. No accessory muscle use Psych: Age appropriate judgment and insight, normal affect and mood  Assessment and Plan: Bipolar 2 disorder (HCC) - Plan: lamoTRIgine  (LAMICTAL ) 25 MG tablet, Ambulatory referral to Psychiatry  Screening-pulmonary TB  Tobacco abuse  Screening for tuberculosis - Plan: PPD  Low blood monocyte count - Plan: CBC with Differential/Platelet, Iron , TIBC and Ferritin Panel  Chronic, not controlled.  Refer to psychiatry.  Self-referral resources provided.  Start Lamictal  20 mg daily for 2 weeks and then increase to 20 mg twice daily for the following 2 weeks.  Follow-up in 1 month. Reorder patches.  Commended on cessation efforts. Screen as requested. Follow-up on CBC and iron  panel. The patient voiced understanding and agreement to the plan.  Mabel Mt [place], DO 09/17/23  2:50 PM

## 2023-09-18 ENCOUNTER — Ambulatory Visit: Payer: Self-pay | Admitting: Family Medicine

## 2023-09-18 LAB — CBC WITH DIFFERENTIAL/PLATELET
Basophils Absolute: 0.1 K/uL (ref 0.0–0.1)
Basophils Relative: 0.6 % (ref 0.0–3.0)
Eosinophils Absolute: 0.1 K/uL (ref 0.0–0.7)
Eosinophils Relative: 1 % (ref 0.0–5.0)
HCT: 39.8 % (ref 36.0–46.0)
Hemoglobin: 13 g/dL (ref 12.0–15.0)
Lymphocytes Relative: 19 % (ref 12.0–46.0)
Lymphs Abs: 1.7 K/uL (ref 0.7–4.0)
MCHC: 32.6 g/dL (ref 30.0–36.0)
MCV: 87.8 fl (ref 78.0–100.0)
Monocytes Absolute: 0.7 K/uL (ref 0.1–1.0)
Monocytes Relative: 7.5 % (ref 3.0–12.0)
Neutro Abs: 6.4 K/uL (ref 1.4–7.7)
Neutrophils Relative %: 71.9 % (ref 43.0–77.0)
Platelets: 201 K/uL (ref 150.0–400.0)
RBC: 4.53 Mil/uL (ref 3.87–5.11)
RDW: 15.2 % (ref 11.5–15.5)
WBC: 8.9 K/uL (ref 4.0–10.5)

## 2023-09-18 LAB — IRON,TIBC AND FERRITIN PANEL
%SAT: 16 % (ref 16–45)
Ferritin: 25 ng/mL (ref 16–154)
Iron: 52 ug/dL (ref 40–190)
TIBC: 334 ug/dL (ref 250–450)

## 2023-09-19 ENCOUNTER — Ambulatory Visit: Payer: MEDICAID | Admitting: *Deleted

## 2023-09-19 ENCOUNTER — Other Ambulatory Visit: Payer: Self-pay

## 2023-09-19 DIAGNOSIS — I10 Essential (primary) hypertension: Secondary | ICD-10-CM

## 2023-09-19 DIAGNOSIS — Z111 Encounter for screening for respiratory tuberculosis: Secondary | ICD-10-CM | POA: Diagnosis not present

## 2023-09-19 LAB — TB SKIN TEST: TB Skin Test: NEGATIVE

## 2023-09-19 MED ORDER — BLOOD PRESSURE KIT DEVI: 0 refills | Status: DC

## 2023-09-19 NOTE — Progress Notes (Signed)
 Pt here for PPD reading.  PPD placed: 09/17/23, left arm.  PPD read and results entered in EpicCare. Result: 0 mm induration. Interpretation: negative Allergic reaction: no   Result letter given to pt today.

## 2023-09-24 ENCOUNTER — Encounter: Payer: Self-pay | Admitting: Family Medicine

## 2023-09-24 DIAGNOSIS — G8929 Other chronic pain: Secondary | ICD-10-CM

## 2023-09-27 ENCOUNTER — Other Ambulatory Visit: Payer: Self-pay | Admitting: Family Medicine

## 2023-09-27 DIAGNOSIS — E1040 Type 1 diabetes mellitus with diabetic neuropathy, unspecified: Secondary | ICD-10-CM

## 2023-10-02 ENCOUNTER — Other Ambulatory Visit: Payer: Self-pay | Admitting: Family Medicine

## 2023-10-02 DIAGNOSIS — G4700 Insomnia, unspecified: Secondary | ICD-10-CM

## 2023-10-07 ENCOUNTER — Encounter: Payer: Self-pay | Admitting: Family Medicine

## 2023-10-09 ENCOUNTER — Encounter: Payer: Self-pay | Admitting: Family Medicine

## 2023-10-10 ENCOUNTER — Other Ambulatory Visit: Payer: Self-pay

## 2023-10-10 ENCOUNTER — Telehealth: Payer: Self-pay

## 2023-10-10 DIAGNOSIS — N393 Stress incontinence (female) (male): Secondary | ICD-10-CM

## 2023-10-10 DIAGNOSIS — M545 Low back pain, unspecified: Secondary | ICD-10-CM

## 2023-10-10 NOTE — Telephone Encounter (Signed)
 Copied from CRM #[id number]. Topic: General - Other >> Oct 10, 2023  3:01 PM Paige D wrote: Reason for CRM: Mckayla with [facility] calling they  received a  referral for pt  Dr. refusing to see pt because he does not do uro dynamics and pt needs uro dynamics.  Provider states pt should be referred to [facility] or [facility]. Please send new referral and update pt.

## 2023-10-13 NOTE — Telephone Encounter (Signed)
 Could you change the referral please

## 2023-10-22 ENCOUNTER — Other Ambulatory Visit: Payer: Self-pay | Admitting: Family Medicine

## 2023-10-22 DIAGNOSIS — K219 Gastro-esophageal reflux disease without esophagitis: Secondary | ICD-10-CM

## 2023-10-22 DIAGNOSIS — F3181 Bipolar II disorder: Secondary | ICD-10-CM

## 2023-10-22 DIAGNOSIS — G4700 Insomnia, unspecified: Secondary | ICD-10-CM

## 2023-10-22 DIAGNOSIS — E1040 Type 1 diabetes mellitus with diabetic neuropathy, unspecified: Secondary | ICD-10-CM

## 2023-11-05 ENCOUNTER — Ambulatory Visit (INDEPENDENT_AMBULATORY_CARE_PROVIDER_SITE_OTHER): Payer: MEDICAID | Admitting: Family Medicine

## 2023-11-05 VITALS — BP 136/86 | HR 70 | Temp 98.0°F | Resp 16 | Ht 68.0 in | Wt 309.4 lb

## 2023-11-05 DIAGNOSIS — Z23 Encounter for immunization: Secondary | ICD-10-CM | POA: Diagnosis not present

## 2023-11-05 DIAGNOSIS — R052 Subacute cough: Secondary | ICD-10-CM

## 2023-11-05 DIAGNOSIS — M79662 Pain in left lower leg: Secondary | ICD-10-CM | POA: Diagnosis not present

## 2023-11-05 DIAGNOSIS — J4520 Mild intermittent asthma, uncomplicated: Secondary | ICD-10-CM

## 2023-11-05 DIAGNOSIS — N939 Abnormal uterine and vaginal bleeding, unspecified: Secondary | ICD-10-CM

## 2023-11-05 MED ORDER — AIRSUPRA 90-80 MCG/ACT IN AERO: 2.0000 | INHALATION_SPRAY | Freq: Four times a day (QID) | RESPIRATORY_TRACT | 2 refills | Status: AC | PRN

## 2023-11-05 MED ORDER — IPRATROPIUM-ALBUTEROL 0.5-2.5 (3) MG/3ML IN SOLN
3.0000 mL | Freq: Once | RESPIRATORY_TRACT | Status: AC
  Administered 2023-11-05: 3 mL via RESPIRATORY_TRACT

## 2023-11-05 NOTE — Patient Instructions (Signed)
 Give us  2-3 business days to get the results of your labs back.   Let us  know how things go with your breathing treatment.   Let us  know if you need anything.

## 2023-11-05 NOTE — Progress Notes (Signed)
 Chief Complaint  Patient presents with   Follow-up    ER Follow Up    Molly Savage here for URI complaints.  Duration: 1 month  Associated symptoms: sinus congestion, rhinorrhea, and coughing, hoarseness; over the last couple days she did feel a higher heart rate Denies: sinus pain, itchy watery eyes, ear pain, ear drainage, sore throat, wheezing, shortness of breath, myalgia, and fevers Treatment to date: Zpak, prednisone, reflux tx Sick contacts: No She still smokes daily.  Patient has been having abnormal uterine bleeding.  She has been having some cramping and fullness in the urinary.  She has a family and personal history of having bleeding early on in pregnancy.  Urine testing at the ER was negative.  She would like it checked again today.  Past Medical History:  Diagnosis Date   Anxiety    Depression    Diabetes mellitus type 1 (HCC)    Follows with Endo   GERD (gastroesophageal reflux disease)    Hypertension    Tobacco abuse    Wears glasses     Objective BP 136/86 (BP Location: Right Arm, Patient Position: Sitting)   Pulse 70   Temp 98 F (36.7 C) (Oral)   Resp 16   Ht 5' 8 (1.727 m)   Wt (!) 309 lb 6.4 oz (140.3 kg)   SpO2 98%   BMI 47.04 kg/m  General: Awake, alert, appears stated age HEENT: AT, [place], ears patent b/l and TM's neg, nares patent w/o discharge, pharynx pink and without exudates, MMM, no sinus TTP Neck: No masses or asymmetry Heart: RRR, no lower extremity edema bilaterally Lungs: CTAB, no accessory muscle use Abdomen: Bowel sounds present, soft, nontender, nondistended MSK: Negative Homans but there is pain to TTP over the medial left calf compared to the right.  No asymmetry or deformity. Psych: Age appropriate judgment and insight, normal mood and affect  Subacute cough - Plan: ipratropium-albuterol (DUONEB) 0.5-2.5 (3) MG/3ML nebulizer solution 3 mL  Abnormal uterine bleeding (AUB) - Plan: US  Transvaginal Non-OB, hCG, quantitative,  pregnancy  Pain of left calf - Plan: D-Dimer, Quantitative  Need for influenza vaccination - Plan: Flu vaccine trivalent PF, 6mos and older(Flulaval,Afluria,Fluarix,Fluzone)  Mild intermittent extrinsic asthma without complication  Not obviously infectious.  DuoNeb treatment today.  Reported that it did make her feel better.  Continue to push fluids, practice good hand hygiene, cover mouth when coughing. F/u prn. If starting to experience fevers, shaking, or shortness of breath, seek immediate care.  Consider chest x-ray if no continued improvement. Pregnancy test today.  Check ultrasound. D-dimer to rule out possible DVT/PE. Flu shot today. Will get her an inhaler given the improvement with the breathing treatment. Pt voiced understanding and agreement to the plan.  Spent 31 minutes with the patient discussing the above plans in addition to reviewing her chart at the send at the visit.  [name], DO 11/05/23 5:07 PM

## 2023-11-06 ENCOUNTER — Ambulatory Visit: Payer: Self-pay | Admitting: Family Medicine

## 2023-11-06 DIAGNOSIS — R052 Subacute cough: Secondary | ICD-10-CM

## 2023-11-06 LAB — HCG, QUANTITATIVE, PREGNANCY: Quantitative HCG: <0.6 m[IU]/mL

## 2023-11-08 ENCOUNTER — Ambulatory Visit (HOSPITAL_BASED_OUTPATIENT_CLINIC_OR_DEPARTMENT_OTHER)
Admission: RE | Admit: 2023-11-08 | Discharge: 2023-11-08 | Disposition: A | Discharge status: Home / Self Care | Payer: MEDICAID | Source: Ambulatory Visit | Attending: Family Medicine | Admitting: Family Medicine

## 2023-11-08 DIAGNOSIS — N939 Abnormal uterine and vaginal bleeding, unspecified: Secondary | ICD-10-CM | POA: Diagnosis present

## 2023-11-10 ENCOUNTER — Other Ambulatory Visit: Payer: Self-pay

## 2023-11-10 DIAGNOSIS — N939 Abnormal uterine and vaginal bleeding, unspecified: Secondary | ICD-10-CM

## 2023-11-24 ENCOUNTER — Other Ambulatory Visit: Payer: Self-pay | Admitting: Family Medicine

## 2023-11-24 DIAGNOSIS — F3181 Bipolar II disorder: Secondary | ICD-10-CM

## 2023-11-24 MED ORDER — ALBUTEROL SULFATE (2.5 MG/3ML) 0.083% IN NEBU: 2.5000 mg | INHALATION_SOLUTION | Freq: Four times a day (QID) | RESPIRATORY_TRACT | 1 refills | Status: AC | PRN

## 2023-11-26 ENCOUNTER — Other Ambulatory Visit: Payer: Self-pay | Admitting: Family Medicine

## 2023-11-26 ENCOUNTER — Telehealth: Payer: MEDICAID | Admitting: Family Medicine

## 2023-11-26 ENCOUNTER — Encounter: Payer: Self-pay | Admitting: Family Medicine

## 2023-11-26 DIAGNOSIS — L03116 Cellulitis of left lower limb: Secondary | ICD-10-CM | POA: Diagnosis not present

## 2023-11-26 DIAGNOSIS — E1040 Type 1 diabetes mellitus with diabetic neuropathy, unspecified: Secondary | ICD-10-CM

## 2023-11-26 MED ORDER — KETOCONAZOLE 2 % EX CREA: 1.0000 | TOPICAL_CREAM | Freq: Every day | CUTANEOUS | 0 refills | Status: AC

## 2023-11-26 MED ORDER — DOXYCYCLINE HYCLATE 100 MG PO TABS: 100.0000 mg | ORAL_TABLET | Freq: Two times a day (BID) | ORAL | 0 refills | Status: DC

## 2023-11-26 NOTE — Progress Notes (Signed)
 CC: skin complaint  Molly Savage is a 33 y.o. female here for a skin complaint. We are interacting via web portal for an electronic face-to-face visit. I verified patient's ID using 2 identifiers. Patient agreed to proceed with visit via this method. Patient is at home, I am at office. Patient and I are present for visit.   Duration: several weeks after she got a biopsy on her leg Location: LLE Pruritic? No Painful? Yes Drainage? drainage New soaps/lotions/topicals/detergents? No Trauma? No Other associated symptoms: redness that has been spreading; no fevers Therapies tried thus far: abx ointment/wash  Past Medical History:  Diagnosis Date   Anxiety    Depression    Diabetes mellitus type 1 (HCC)    Follows with Endo   GERD (gastroesophageal reflux disease)    Hypertension    Tobacco abuse    Wears glasses    Obj No conversational dyspnea Age appropriate judgment and insight Nml affect and mood  Cellulitis of left lower extremity - Plan: doxycycline (VIBRA-TABS) 100 MG tablet  10 d of doxy. Seek immediate care if worsening or failure to improve as she may need IV abx.  F/u prn. The patient voiced understanding and agreement to the plan.  Mabel Mt [place], DO 11/26/23 11:42 AM

## 2023-12-12 ENCOUNTER — Encounter: Payer: Self-pay | Admitting: Family Medicine

## 2024-01-26 ENCOUNTER — Telehealth: Payer: MEDICAID | Admitting: Family Medicine

## 2024-01-26 ENCOUNTER — Encounter: Payer: Self-pay | Admitting: Family Medicine

## 2024-01-26 DIAGNOSIS — R5383 Other fatigue: Secondary | ICD-10-CM | POA: Diagnosis not present

## 2024-01-26 DIAGNOSIS — R198 Other specified symptoms and signs involving the digestive system and abdomen: Secondary | ICD-10-CM

## 2024-01-26 DIAGNOSIS — F3181 Bipolar II disorder: Secondary | ICD-10-CM

## 2024-01-26 DIAGNOSIS — R635 Abnormal weight gain: Secondary | ICD-10-CM | POA: Diagnosis not present

## 2024-01-26 MED ORDER — LABETALOL HCL 300 MG PO TABS: 300.0000 mg | ORAL_TABLET | Freq: Two times a day (BID) | ORAL | 1 refills | Status: AC

## 2024-01-26 MED ORDER — MONTELUKAST SODIUM 10 MG PO TABS: 10.0000 mg | ORAL_TABLET | Freq: Every day | ORAL | 3 refills | Status: AC

## 2024-01-26 MED ORDER — CETIRIZINE HCL 10 MG PO TABS: 10.0000 mg | ORAL_TABLET | Freq: Every day | ORAL | 1 refills | Status: AC

## 2024-01-26 MED ORDER — LAMOTRIGINE 25 MG PO TABS: ORAL_TABLET | ORAL | 0 refills | Status: AC

## 2024-01-26 NOTE — Progress Notes (Signed)
 Chief Complaint  Patient presents with   Follow-up    Follow Up    Subjective: Patient is a 34 y.o. female here for f/u. We are interacting via web portal for an electronic face-to-face visit. I verified patient's ID using 2 identifiers. Patient agreed to proceed with visit via this method. Patient is in a parked car, I am at office. Patient and I are present for visit.   Over the past 2 mo, she has gained 40 lbs unexpectedly.  She has associated fatigue and does not feel well rested in the morning.  She is unsure if she snores at night.  She feels more pressure in her abd region and is urinating more freq. She feels a hard lump in her abd region. She feels bloating with it. No fevers, N/V/C/D, bleeding, decreased PO intake, difficulty swallowing.  Eating is largely unaffected.     Past Medical History:  Diagnosis Date   Anxiety    Depression    Diabetes mellitus type 1 (HCC)    Follows with Endo   GERD (gastroesophageal reflux disease)    Hypertension    Tobacco abuse    Wears glasses     Objective: No conversational dyspnea Age appropriate judgment and insight Nml affect and mood  Assessment and Plan: Weight gain  Bipolar 2 disorder (HCC) - Plan: Ambulatory referral to Psychiatry, lamoTRIgine  (LAMICTAL ) 25 MG tablet  Fatigue, unspecified type - Plan: CBC, Comprehensive metabolic panel with GFR, VITAMIN D  25 Hydroxy (Vit-D Deficiency, Fractures), TSH, T4, free, B12  Abdominal fullness - Plan: CT ABDOMEN PELVIS W CONTRAST  Counseled on diet/exercise. Ck CT. Chronic, uncontrolled. Restart Lamictal  25 mg/d for 1 week and then bid. Refer Psychiatry again.  Ck labs. Could be 2/2 #2.  Reports a fullness/mass in her abd region. I will order imaging but advised it is better to examine her first. We will see what her ins says.  The patient voiced understanding and agreement to the plan.  Mabel Mt [place], DO 01/26/2024  3:16 PM

## 2024-01-29 ENCOUNTER — Ambulatory Visit: Payer: MEDICAID | Admitting: Dermatology

## 2024-02-03 ENCOUNTER — Encounter: Payer: Self-pay | Admitting: Family Medicine

## 2024-02-03 NOTE — Addendum Note (Signed)
 Addended by: Maijor Hornig M on: 02/03/2024 10:08 AM   Modules accepted: Orders

## 2024-02-04 ENCOUNTER — Other Ambulatory Visit: Payer: MEDICAID

## 2024-02-04 ENCOUNTER — Other Ambulatory Visit: Payer: Self-pay

## 2024-02-04 ENCOUNTER — Ambulatory Visit: Payer: Self-pay | Admitting: Family Medicine

## 2024-02-04 DIAGNOSIS — R198 Other specified symptoms and signs involving the digestive system and abdomen: Secondary | ICD-10-CM

## 2024-02-04 DIAGNOSIS — R948 Abnormal results of function studies of other organs and systems: Secondary | ICD-10-CM

## 2024-02-04 DIAGNOSIS — R5383 Other fatigue: Secondary | ICD-10-CM

## 2024-02-04 LAB — CBC
HCT: 39 % (ref 36.0–46.0)
Hemoglobin: 13.1 g/dL (ref 12.0–15.0)
MCHC: 33.6 g/dL (ref 30.0–36.0)
MCV: 86.7 fl (ref 78.0–100.0)
Platelets: 251 K/uL (ref 150.0–400.0)
RBC: 4.49 Mil/uL (ref 3.87–5.11)
RDW: 13.7 % (ref 11.5–15.5)
WBC: 9.6 K/uL (ref 4.0–10.5)

## 2024-02-04 LAB — COMPREHENSIVE METABOLIC PANEL WITH GFR
ALT: 18 U/L (ref 3–35)
AST: 14 U/L (ref 5–37)
Albumin: 3.8 g/dL (ref 3.5–5.2)
Alkaline Phosphatase: 152 U/L — ABNORMAL HIGH (ref 39–117)
BUN: 11 mg/dL (ref 6–23)
CO2: 27 meq/L (ref 19–32)
Calcium: 8.9 mg/dL (ref 8.4–10.5)
Chloride: 101 meq/L (ref 96–112)
Creatinine, Ser: 0.71 mg/dL (ref 0.40–1.20)
GFR: 110.22 mL/min
Glucose, Bld: 236 mg/dL — ABNORMAL HIGH (ref 70–99)
Potassium: 4.5 meq/L (ref 3.5–5.1)
Sodium: 134 meq/L — ABNORMAL LOW (ref 135–145)
Total Bilirubin: 0.2 mg/dL (ref 0.2–1.2)
Total Protein: 6.5 g/dL (ref 6.0–8.3)

## 2024-02-04 LAB — VITAMIN D 25 HYDROXY (VIT D DEFICIENCY, FRACTURES): VITD: 10.03 ng/mL — ABNORMAL LOW (ref 30.00–100.00)

## 2024-02-04 LAB — VITAMIN B12: Vitamin B-12: 543 pg/mL (ref 211–911)

## 2024-02-04 LAB — T4, FREE: Free T4: 0.84 ng/dL (ref 0.60–1.60)

## 2024-02-04 LAB — TSH: TSH: 2.29 u[IU]/mL (ref 0.35–5.50)

## 2024-02-04 MED ORDER — VITAMIN D (ERGOCALCIFEROL) 1.25 MG (50000 UNIT) PO CAPS: 50000.0000 [IU] | ORAL_CAPSULE | ORAL | 0 refills | Status: AC

## 2024-02-04 NOTE — Progress Notes (Signed)
 " WOUND CARE CONSULTATION REPORT - OUTPATIENT CLINIC  [facility]  Referred by: Lanis Lyle Brothers, FNP  Chief Complaint: L leg   Relevant past medical history includes:  -DM, Bipolar, Depression, htn, +tobacco use, memory deficit, venous insufficiency.  Medical History[1]   Relevant Labs:  Lab Results  Component Value Date   HGBA1C 7.8 (A) 09/13/2021   HGBA1C 8.1 (A) 06/14/2021   HGBA1C 7.9 (A) 03/09/2021   HGBA1C 7.6 (A) 11/28/2020   ALBUMIN  3.7 11/29/2023   ALBUMIN  3.1 (L) 08/01/2022   ALBUMIN  4.0 04/10/2022   ALBUMIN  4.5 09/10/2019   PLT 239 11/29/2023   PLT 187 09/03/2022   PLT 188 08/01/2022   PLT 258 04/15/2022   WBC 11.02 (H) 11/29/2023   WBC 10.40 09/03/2022   WBC 10.42 08/01/2022   WBC 12.30 (H) 04/15/2022     Initial visit /Background / obtained on 12/10/23 :  34 year old female with multiple medical problems, presents today for evaluation of left leg wound.  Patient states that she has been dealing with a red inflamed localized area on her anterior shin for many years.  The etiology is unknown and she does not recall any trauma.  She states that she saw dermatology for this and underwent a punch biopsy recently, and was given a cream for dermatitis.  She does not recall what the biopsy report stated as a diagnosis.  She was unable to get the cream due to insurance coverage.    Approximately 2 months ago she bumped this inflamed area against a box, which created a wound within this inflamed site that has not healed.  She was placed on oral antibiotics, presumably by her family medicine doctor.  She went to an emergency room on 11/8 due to concerns of spreading redness up and down her leg despite antibiotics.  She underwent a CT scan of the leg, which was concerning for possible cellulitis.  And had a white count of 11.  Was  started on IV antibiotics, but had to refuse admission to the hospital for  further care due to family obligations.  She reports that she stopped the oral antibiotic because it did not work.  She is currently treating the site with Vashe and gauze coverings.  She notes itching and intermittent pain.  Medical history is notable above.  She does smoke tobacco and is actively trying to quit (as been unsuccessful with standard cessation options like Chantix ).  Hemoglobin A1c is 7.5 per endocrine notes.   Hospital notes, ER notes, family medicine notes, endocrine notes personally reviewed.  Labs and imaging personally reviewed.  Interval history/progress:  12/10: 2wk f/u.  Missed last week's apt due to family issues.  Opted to smoke outside after checking into appointment today.  Reports that she has been using Santyl  and completed antibiotics that were called in for her culture results.  Notes occasional burning type pain that occurs with cream application and also at random.  12/17: 1wk f/u.   Feeling well.  Reports weaning her tobacco use.  Tolerating wrap.  Has an appointment with endocrine next week to increase insulin  pump dosing.  Biopsy report obtained: Necrobiosis lipoidica  1/7: 2-week follow-up.  Missed last week due to feeling ill.  Removed wrap and has been using Band-Aid.  Vitals:   02/04/24 0827  BP: (!) 162/97  Pulse: 91  Temp: 97.3 F (36.3 C)      Exam: Pt is AA in NAD. Oriented x 3  Wound(s) exam: Shallow wound on anterior shin wound, very slightly increased size.  Increased slough.  Increased erythema, and shape aligned with Band-Aid --Wound debrided with curette down to and including subcutaneous tissue.  Bleeding controlled with pressure.    Wound 11/29/23 Venous Ulcer Pretibial Left (Active)  Date First Assessed/Time First Assessed: 11/29/23 2102   Pre-Existing Wound: Yes  Primary Wound Type: Venous Ulcer  Location: Pretibial  Wound Location Orientation: Left  Wound Description (Comments): 2x2 red, weeping ulcer    Assessments 11/29/2023   9:02 PM 02/04/2024  8:27 AM  Wound Image     Site Assessment Clean;Intact;Red;Swelling Red  Peri-Wound Assessment Edema;Red --  Drainage Amount Scant Moderate  Drainage Description Serous Serosanguineous  Wound Odor None --  Dressing ABD pad --  Dressing Changed Reinforced --  Dressing Status Clean;Dry;Intact --  Treatments -- Cleansed  Wound Cleanser -- Antimicrobial solution  Wound Length (cm) -- 2.5 cm  Wound Width (cm) -- 2 cm  Wound Surface Area (cm^2) -- 3.93 cm^2  Wound Depth (cm) -- 0.2 cm  Wound Volume (cm^3) -- 0.524 cm^3  Wound Healing % -- 20  Non-staged Wound Description -- Full thickness     Active Orders  Date Order Priority Status Authorizing Provider  01/28/24 0947 Wound Care Venous Ulcer Left Pretibial Routine Active Arlean Almarie Cage, MD    - Wound cleansing::    Keep dry    - Dressing change frequency::    Do not change dressing for an entire week    - Primary wound dressing::    Other (Specify) (Triamcinolone  cream,gentian violet,polymem,prisma)    - Edema control::    Unna boot to left lower extremity    - Additional Orders/Instructions::    Follow nutritous diet    - Follow Up Appointment::    Return to clinic in 1 week     Inactive Orders  Date Order Priority Status Authorizing Provider  01/28/24 0854 Debridement Venous Ulcer Left Pretibial Routine Completed Arlean Almarie Cage, MD  01/07/24 [phone] Wound Care Venous Ulcer Left Pretibial Routine Discontinued Arlean Almarie Cage, MD    - Wound cleansing::    Keep dry    - Dressing change frequency::    Do not change dressing for an entire week    - Primary wound dressing::    Other (Specify) (Triamcinolone  cream periwound, Gentian Violet,prisma)    - Secondary wound dressing::    Other (Specify) (optilock)    - Edema control::    Unna boot to left lower extremity    - Additional Orders/Instructions::    Follow nutritous diet    - Follow Up Appointment::    Return to clinic in 1 week  01/07/24  0826 Debridement Venous Ulcer Left Pretibial Routine Completed Arlean Almarie Cage, MD  12/31/23 0915 Wound Care Venous Ulcer Left Pretibial Routine Discontinued Arlean Almarie Cage, MD    - Follow Up Appointment::    Nurse Visit (prn)  12/31/23 0850 Debridement Venous Ulcer Left Pretibial Routine Completed Arlean Almarie Cage, MD  12/10/23 0933 Wound Care Venous Ulcer Left Pretibial Routine Discontinued Arlean Almarie Cage, MD  12/10/23 0902 Debridement Venous Ulcer Left Pretibial Routine Completed Arlean Almarie Cage, MD      Assessment Encounter Diagnoses  Name Primary?   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  Resume gentian violet, collagen, PolyMem, Unna boot with generous triamcinolone  to periwound.  Change weekly.   - Elevate legs is much as able.  Try to stay off feet a little more this week. - Smoking cessation: Patient is aware that this is hindering her healing.  She reports trying several things to quit.  She did accept tobacco cessation resources at initial visit.  Reviewed importance of this again today and patient reports she will continue to work on this. - Nutrition: Reviewed importance of high-protein diet and multivitamin.  Educational material reviewed and given to patient at initial visit.  Patient reports taking a multivitamin as well as protein with each meal.   -Diabetes: Patient reports some improvements early on with new monitor.  Reviewed the relationship between diabetes and wound healing again today.   - Follow-up 1wk.      No orders of the defined types were placed in this encounter.     Anne Argenta 02/04/2024  Disclaimer: Parts of this document has been created using voice recognition  software and it may, therefore, contain incidental typographical errors,  grammatical errors, misspellings  with similar sounding words transcribed incorrectly.          [1] Past Medical History: Diagnosis Date   Acid reflux    Callus of foot    Depression    Diabetes mellitus (CMD)    Diabetes mellitus type I (CMD)    Herpes    Hypertension   "

## 2024-02-13 ENCOUNTER — Other Ambulatory Visit (HOSPITAL_BASED_OUTPATIENT_CLINIC_OR_DEPARTMENT_OTHER): Payer: MEDICAID | Admitting: Radiology

## 2024-02-18 ENCOUNTER — Other Ambulatory Visit: Payer: MEDICAID

## 2024-02-18 NOTE — Telephone Encounter (Signed)
 Molly Savage with [facility] calling in to follow up on patients order for [facility] G6 transmitter   Request sent 1/6  KEY CODE [id number]   Please sent order  phone [phone]  Fax [phone]

## 2024-02-19 ENCOUNTER — Other Ambulatory Visit: Payer: Self-pay

## 2024-02-19 DIAGNOSIS — G8929 Other chronic pain: Secondary | ICD-10-CM

## 2024-03-24 ENCOUNTER — Other Ambulatory Visit: Payer: MEDICAID
